# Patient Record
Sex: Male | Born: 1960 | Race: Black or African American | Hispanic: No | Marital: Single | Smoking: Never smoker
Health system: Southern US, Community
[De-identification: ages and names within clinical notes are randomized; demographics above are authoritative.]

## PROBLEM LIST (undated history)

## (undated) DIAGNOSIS — I890 Lymphedema, not elsewhere classified: Secondary | ICD-10-CM

## (undated) DIAGNOSIS — D649 Anemia, unspecified: Secondary | ICD-10-CM

## (undated) DIAGNOSIS — Z972 Presence of dental prosthetic device (complete) (partial): Secondary | ICD-10-CM

## (undated) DIAGNOSIS — K5730 Diverticulosis of large intestine without perforation or abscess without bleeding: Secondary | ICD-10-CM

## (undated) DIAGNOSIS — I10 Essential (primary) hypertension: Secondary | ICD-10-CM

## (undated) DIAGNOSIS — N189 Chronic kidney disease, unspecified: Secondary | ICD-10-CM

## (undated) DIAGNOSIS — G4733 Obstructive sleep apnea (adult) (pediatric): Secondary | ICD-10-CM

## (undated) DIAGNOSIS — L987 Excessive and redundant skin and subcutaneous tissue: Secondary | ICD-10-CM

## (undated) DIAGNOSIS — I509 Heart failure, unspecified: Secondary | ICD-10-CM

## (undated) DIAGNOSIS — I2699 Other pulmonary embolism without acute cor pulmonale: Secondary | ICD-10-CM

## (undated) HISTORY — DX: Heart failure, unspecified: I50.9

## (undated) HISTORY — PX: COLONOSCOPY: SHX174

## (undated) HISTORY — PX: MULTIPLE TOOTH EXTRACTIONS: SHX2053

## (undated) HISTORY — PX: NO PAST SURGERIES: SHX2092

## (undated) HISTORY — DX: Anemia, unspecified: D64.9

## (undated) HISTORY — DX: Obstructive sleep apnea (adult) (pediatric): G47.33

## (undated) HISTORY — PX: EYE SURGERY: SHX253

## (undated) HISTORY — DX: Other pulmonary embolism without acute cor pulmonale: I26.99

## (undated) HISTORY — DX: Diverticulosis of large intestine without perforation or abscess without bleeding: K57.30

## (undated) HISTORY — DX: Essential (primary) hypertension: I10

---

## 2001-11-22 ENCOUNTER — Encounter: Payer: Self-pay | Admitting: *Deleted

## 2001-11-22 ENCOUNTER — Encounter: Payer: Self-pay | Admitting: Cardiovascular Disease

## 2001-11-22 ENCOUNTER — Inpatient Hospital Stay (HOSPITAL_COMMUNITY): Admission: EM | Admit: 2001-11-22 | Discharge: 2001-11-24 | Payer: Self-pay | Admitting: *Deleted

## 2001-12-04 ENCOUNTER — Encounter: Admission: RE | Admit: 2001-12-04 | Discharge: 2001-12-04 | Payer: Self-pay | Admitting: Family Medicine

## 2001-12-07 ENCOUNTER — Encounter: Admission: RE | Admit: 2001-12-07 | Discharge: 2001-12-07 | Payer: Self-pay | Admitting: Family Medicine

## 2002-12-30 ENCOUNTER — Inpatient Hospital Stay (HOSPITAL_COMMUNITY): Admission: EM | Admit: 2002-12-30 | Discharge: 2003-01-01 | Payer: Self-pay | Admitting: Emergency Medicine

## 2003-01-14 ENCOUNTER — Encounter: Admission: RE | Admit: 2003-01-14 | Discharge: 2003-01-14 | Payer: Self-pay | Admitting: Family Medicine

## 2003-08-29 ENCOUNTER — Encounter: Admission: RE | Admit: 2003-08-29 | Discharge: 2003-08-29 | Payer: Self-pay | Admitting: Family Medicine

## 2003-09-11 ENCOUNTER — Encounter: Admission: RE | Admit: 2003-09-11 | Discharge: 2003-09-11 | Payer: Self-pay | Admitting: Family Medicine

## 2003-09-13 ENCOUNTER — Emergency Department (HOSPITAL_COMMUNITY): Admission: EM | Admit: 2003-09-13 | Discharge: 2003-09-14 | Payer: Self-pay | Admitting: Emergency Medicine

## 2003-09-17 ENCOUNTER — Encounter: Admission: RE | Admit: 2003-09-17 | Discharge: 2003-09-17 | Payer: Self-pay | Admitting: Sports Medicine

## 2003-10-10 ENCOUNTER — Encounter: Admission: RE | Admit: 2003-10-10 | Discharge: 2003-10-10 | Payer: Self-pay | Admitting: Family Medicine

## 2003-10-11 ENCOUNTER — Encounter: Admission: RE | Admit: 2003-10-11 | Discharge: 2003-10-11 | Payer: Self-pay | Admitting: Family Medicine

## 2006-04-21 DIAGNOSIS — I10 Essential (primary) hypertension: Secondary | ICD-10-CM

## 2006-04-21 HISTORY — DX: Essential (primary) hypertension: I10

## 2007-01-31 ENCOUNTER — Telehealth (INDEPENDENT_AMBULATORY_CARE_PROVIDER_SITE_OTHER): Payer: Self-pay | Admitting: Family Medicine

## 2007-02-03 ENCOUNTER — Ambulatory Visit: Payer: Self-pay | Admitting: Family Medicine

## 2007-03-10 ENCOUNTER — Ambulatory Visit: Payer: Self-pay | Admitting: Family Medicine

## 2007-03-14 ENCOUNTER — Encounter (INDEPENDENT_AMBULATORY_CARE_PROVIDER_SITE_OTHER): Payer: Self-pay | Admitting: Family Medicine

## 2007-04-06 ENCOUNTER — Encounter (INDEPENDENT_AMBULATORY_CARE_PROVIDER_SITE_OTHER): Payer: Self-pay | Admitting: Family Medicine

## 2007-04-06 ENCOUNTER — Ambulatory Visit: Payer: Self-pay | Admitting: Family Medicine

## 2007-04-06 LAB — CONVERTED CEMR LAB
ALT: 17 units/L (ref 0–53)
AST: 25 units/L (ref 0–37)
Albumin: 3.5 g/dL (ref 3.5–5.2)
Alkaline Phosphatase: 58 units/L (ref 39–117)
BUN: 20 mg/dL (ref 6–23)
CO2: 26 meq/L (ref 19–32)
Calcium: 9.3 mg/dL (ref 8.4–10.5)
Chloride: 104 meq/L (ref 96–112)
Creatinine, Ser: 1.55 mg/dL — ABNORMAL HIGH (ref 0.40–1.50)
Direct LDL: 94 mg/dL
Glucose, Bld: 87 mg/dL (ref 70–99)
Potassium: 4.4 meq/L (ref 3.5–5.3)
Sodium: 138 meq/L (ref 135–145)
Total Bilirubin: 0.4 mg/dL (ref 0.3–1.2)
Total Protein: 8.2 g/dL (ref 6.0–8.3)

## 2007-04-09 ENCOUNTER — Encounter (INDEPENDENT_AMBULATORY_CARE_PROVIDER_SITE_OTHER): Payer: Self-pay | Admitting: *Deleted

## 2007-04-18 ENCOUNTER — Encounter (INDEPENDENT_AMBULATORY_CARE_PROVIDER_SITE_OTHER): Payer: Self-pay | Admitting: Dentistry

## 2007-04-18 ENCOUNTER — Ambulatory Visit (HOSPITAL_COMMUNITY): Admission: RE | Admit: 2007-04-18 | Discharge: 2007-04-18 | Payer: Self-pay | Admitting: Family Medicine

## 2007-05-03 ENCOUNTER — Ambulatory Visit: Payer: Self-pay | Admitting: Family Medicine

## 2007-05-10 ENCOUNTER — Encounter (INDEPENDENT_AMBULATORY_CARE_PROVIDER_SITE_OTHER): Payer: Self-pay | Admitting: Family Medicine

## 2007-10-10 ENCOUNTER — Telehealth: Payer: Self-pay | Admitting: *Deleted

## 2007-10-11 ENCOUNTER — Telehealth (INDEPENDENT_AMBULATORY_CARE_PROVIDER_SITE_OTHER): Payer: Self-pay | Admitting: Family Medicine

## 2007-10-20 ENCOUNTER — Encounter: Payer: Self-pay | Admitting: *Deleted

## 2009-07-09 ENCOUNTER — Ambulatory Visit: Payer: Self-pay | Admitting: Family Medicine

## 2009-07-09 ENCOUNTER — Encounter: Payer: Self-pay | Admitting: Family Medicine

## 2009-07-09 LAB — CONVERTED CEMR LAB
ALT: 11 units/L (ref 0–53)
AST: 16 units/L (ref 0–37)
Albumin: 3.4 g/dL — ABNORMAL LOW (ref 3.5–5.2)
Alkaline Phosphatase: 67 units/L (ref 39–117)
BUN: 18 mg/dL (ref 6–23)
CO2: 24 meq/L (ref 19–32)
Calcium: 8.5 mg/dL (ref 8.4–10.5)
Chloride: 105 meq/L (ref 96–112)
Cholesterol: 171 mg/dL (ref 0–200)
Creatinine, Ser: 1.93 mg/dL — ABNORMAL HIGH (ref 0.40–1.50)
Glucose, Bld: 100 mg/dL — ABNORMAL HIGH (ref 70–99)
HDL: 51 mg/dL (ref >39)
LDL Cholesterol: 96 mg/dL (ref 0–99)
Potassium: 3.7 meq/L (ref 3.5–5.3)
Sodium: 137 meq/L (ref 135–145)
TSH: 5.296 microintl units/mL — ABNORMAL HIGH (ref 0.350–4.500)
Total Bilirubin: 0.6 mg/dL (ref 0.3–1.2)
Total CHOL/HDL Ratio: 3.4
Total Protein: 7.3 g/dL (ref 6.0–8.3)
Triglycerides: 118 mg/dL (ref <150)
VLDL: 24 mg/dL (ref 0–40)

## 2009-07-14 ENCOUNTER — Telehealth: Payer: Self-pay | Admitting: Family Medicine

## 2009-07-14 ENCOUNTER — Ambulatory Visit: Payer: Self-pay | Admitting: Family Medicine

## 2009-07-14 ENCOUNTER — Encounter: Payer: Self-pay | Admitting: Family Medicine

## 2009-07-16 LAB — CONVERTED CEMR LAB
BUN: 32 mg/dL — ABNORMAL HIGH (ref 6–23)
CO2: 23 meq/L (ref 19–32)
Calcium: 8.6 mg/dL (ref 8.4–10.5)
Chloride: 107 meq/L (ref 96–112)
Creatinine, Ser: 2.37 mg/dL — ABNORMAL HIGH (ref 0.40–1.50)
Glucose, Bld: 107 mg/dL — ABNORMAL HIGH (ref 70–99)
Potassium: 4 meq/L (ref 3.5–5.3)
Sodium: 139 meq/L (ref 135–145)

## 2009-07-17 ENCOUNTER — Ambulatory Visit: Payer: Self-pay | Admitting: Family Medicine

## 2009-07-17 DIAGNOSIS — E039 Hypothyroidism, unspecified: Secondary | ICD-10-CM | POA: Insufficient documentation

## 2009-07-17 DIAGNOSIS — I10 Essential (primary) hypertension: Secondary | ICD-10-CM | POA: Insufficient documentation

## 2009-07-24 ENCOUNTER — Ambulatory Visit: Payer: Self-pay | Admitting: Family Medicine

## 2009-07-24 ENCOUNTER — Encounter: Payer: Self-pay | Admitting: Family Medicine

## 2009-07-24 LAB — CONVERTED CEMR LAB
BUN: 21 mg/dL (ref 6–23)
CO2: 22 meq/L (ref 19–32)
Calcium: 9 mg/dL (ref 8.4–10.5)
Chloride: 107 meq/L (ref 96–112)
Creatinine, Ser: 2.06 mg/dL — ABNORMAL HIGH (ref 0.40–1.50)
Glucose, Bld: 99 mg/dL (ref 70–99)
Potassium: 4.5 meq/L (ref 3.5–5.3)
Sodium: 138 meq/L (ref 135–145)
TSH: 4.477 microintl units/mL (ref 0.350–4.500)

## 2009-07-28 ENCOUNTER — Ambulatory Visit: Payer: Self-pay | Admitting: Family Medicine

## 2009-07-28 ENCOUNTER — Telehealth: Payer: Self-pay | Admitting: Family Medicine

## 2009-07-28 DIAGNOSIS — I129 Hypertensive chronic kidney disease with stage 1 through stage 4 chronic kidney disease, or unspecified chronic kidney disease: Secondary | ICD-10-CM

## 2009-07-28 DIAGNOSIS — N184 Chronic kidney disease, stage 4 (severe): Secondary | ICD-10-CM

## 2009-07-28 DIAGNOSIS — I517 Cardiomegaly: Secondary | ICD-10-CM | POA: Insufficient documentation

## 2009-08-12 ENCOUNTER — Telehealth: Payer: Self-pay | Admitting: Family Medicine

## 2009-08-22 ENCOUNTER — Ambulatory Visit: Payer: Self-pay | Admitting: Family Medicine

## 2009-09-02 ENCOUNTER — Telehealth (INDEPENDENT_AMBULATORY_CARE_PROVIDER_SITE_OTHER): Payer: Self-pay | Admitting: *Deleted

## 2009-09-02 ENCOUNTER — Emergency Department (HOSPITAL_COMMUNITY): Admission: EM | Admit: 2009-09-02 | Discharge: 2009-09-02 | Payer: Self-pay | Admitting: Emergency Medicine

## 2009-09-05 ENCOUNTER — Ambulatory Visit: Payer: Self-pay | Admitting: Family Medicine

## 2009-10-28 ENCOUNTER — Encounter: Payer: Self-pay | Admitting: Family Medicine

## 2009-10-30 ENCOUNTER — Telehealth (INDEPENDENT_AMBULATORY_CARE_PROVIDER_SITE_OTHER): Payer: Self-pay | Admitting: *Deleted

## 2009-11-07 ENCOUNTER — Inpatient Hospital Stay (HOSPITAL_COMMUNITY): Admission: EM | Admit: 2009-11-07 | Discharge: 2009-11-10 | Payer: Self-pay | Admitting: Emergency Medicine

## 2009-11-07 ENCOUNTER — Ambulatory Visit: Payer: Self-pay | Admitting: Family Medicine

## 2009-11-07 ENCOUNTER — Encounter: Payer: Self-pay | Admitting: Family Medicine

## 2009-11-07 ENCOUNTER — Telehealth: Payer: Self-pay | Admitting: Family Medicine

## 2009-11-08 ENCOUNTER — Ambulatory Visit: Payer: Self-pay | Admitting: Surgery

## 2009-11-08 ENCOUNTER — Encounter: Payer: Self-pay | Admitting: Family Medicine

## 2009-11-11 LAB — CONVERTED CEMR LAB
HCT: 38.4 % — ABNORMAL LOW (ref 39.0–52.0)
Hemoglobin: 13.3 g/dL (ref 13.0–17.0)
MCHC: 34.6 g/dL (ref 30.0–36.0)
MCV: 81.5 fL (ref 78.0–100.0)
Platelets: 326 10*3/uL (ref 150–400)
Pro B Natriuretic peptide (BNP): 167 pg/mL — ABNORMAL HIGH (ref 0.0–100.0)
RBC: 4.71 M/uL (ref 4.22–5.81)
RDW: 16 % — ABNORMAL HIGH (ref 11.5–15.5)
WBC: 6.2 10*3/uL (ref 4.0–10.5)

## 2009-11-12 ENCOUNTER — Ambulatory Visit: Payer: Self-pay | Admitting: Family Medicine

## 2009-11-12 DIAGNOSIS — I2699 Other pulmonary embolism without acute cor pulmonale: Secondary | ICD-10-CM

## 2009-11-12 HISTORY — DX: Other pulmonary embolism without acute cor pulmonale: I26.99

## 2009-11-12 LAB — CONVERTED CEMR LAB: INR: 2.2

## 2009-11-14 ENCOUNTER — Ambulatory Visit: Payer: Self-pay | Admitting: Family Medicine

## 2009-11-14 LAB — CONVERTED CEMR LAB: INR: 2.9

## 2009-11-21 ENCOUNTER — Encounter: Payer: Self-pay | Admitting: Family Medicine

## 2009-11-21 ENCOUNTER — Ambulatory Visit: Payer: Self-pay | Admitting: Family Medicine

## 2009-11-21 LAB — CONVERTED CEMR LAB
INR: 5.6
INR: 4.27 — ABNORMAL HIGH (ref <1.50)
Prothrombin Time: 40.9 s — ABNORMAL HIGH (ref 11.6–15.2)

## 2009-11-24 ENCOUNTER — Ambulatory Visit: Payer: Self-pay | Admitting: Family Medicine

## 2009-11-24 LAB — CONVERTED CEMR LAB: INR: 3.8

## 2009-11-26 ENCOUNTER — Ambulatory Visit: Payer: Self-pay | Admitting: Family Medicine

## 2009-11-26 LAB — CONVERTED CEMR LAB: INR: 3.1

## 2009-12-02 ENCOUNTER — Encounter: Payer: Self-pay | Admitting: Family Medicine

## 2009-12-02 ENCOUNTER — Ambulatory Visit: Payer: Self-pay | Admitting: Family Medicine

## 2009-12-02 LAB — CONVERTED CEMR LAB: INR: 2.1

## 2009-12-12 ENCOUNTER — Ambulatory Visit: Payer: Self-pay | Admitting: Family Medicine

## 2009-12-12 LAB — CONVERTED CEMR LAB: INR: 1.5

## 2009-12-26 ENCOUNTER — Ambulatory Visit: Payer: Self-pay | Admitting: Family Medicine

## 2009-12-26 LAB — CONVERTED CEMR LAB: INR: 2

## 2010-01-08 ENCOUNTER — Ambulatory Visit: Payer: Self-pay | Admitting: Family Medicine

## 2010-01-08 LAB — CONVERTED CEMR LAB: INR: 2.2

## 2010-01-19 ENCOUNTER — Encounter: Payer: Self-pay | Admitting: Family Medicine

## 2010-01-21 ENCOUNTER — Encounter (INDEPENDENT_AMBULATORY_CARE_PROVIDER_SITE_OTHER): Payer: Self-pay | Admitting: *Deleted

## 2010-01-29 ENCOUNTER — Ambulatory Visit: Payer: Self-pay | Admitting: Family Medicine

## 2010-01-29 LAB — CONVERTED CEMR LAB: INR: 2.5

## 2010-02-26 ENCOUNTER — Ambulatory Visit: Admission: RE | Admit: 2010-02-26 | Discharge: 2010-02-26 | Payer: Self-pay | Source: Home / Self Care

## 2010-02-26 LAB — CONVERTED CEMR LAB: INR: 2.2

## 2010-03-24 NOTE — Progress Notes (Signed)
  Phone Note Other Incoming   Request: Send information Summary of Call: DISABILITY RECORDS REQUEST FORWARDED TO [place].

## 2010-03-24 NOTE — Miscellaneous (Signed)
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 HTN Drug Abuse - history of  ETOH abuse.  CKD stage 3 (01/19/2010 12:03)       Past History:  Past Medical History: CHF - systolic Echo Q000111Q EF 123456; 04/18/07 echo 55-60% EF - LV wall thickness moderately increased, high LV filling pressure, LA mildly dilated, left atrium mildly dilated. HTN Drug Abuse - history of  ETOH abuse.  CKD stage 3

## 2010-03-24 NOTE — Assessment & Plan Note (Signed)
Summary: BP [facility]C K/[facility]  Nurse Visit   Vital Signs:  Patient profile:   50 year old male BP sitting:   171 / 110  (right arm) Cuff size:   large  Vitals Entered By: Audelia Hives CMA (Jul 14, 2009 3:03 PM) CC: bp check  Blood Pressure Check  Did patient rest for 60 secs or longer:yes   Which arm was used:   right   What was the cuff size: large Manual measurement:yes Has patient taken their BP meds today:yes   If no, when was last dose:   Allergies: No Known Drug Allergies  Orders Added: 1)  Est Level 1- [facility] [id number]

## 2010-03-24 NOTE — Progress Notes (Signed)
Summary: phn msg  Phone Note Call from Patient Call back at [phone]   Caller: Patient Summary of Call: needs to speak w/ doctor about meds  Initial call taken by: Audie Clear,  August 12, 2009 10:44 AM  Follow-up for Phone Call        sent to pcp Follow-up by: Audelia Hives CMA,  August 12, 2009 11:39 AM  Additional Follow-up for Phone Call Additional follow up Details #1::        called patient and left message.  Additional Follow-up by: Esmeralda Arthur MD,  August 13, 2009 8:43 AM

## 2010-03-24 NOTE — Miscellaneous (Signed)
Summary: medical record request  Clinical Lists Changes  Rec'd medical record request by pt Picked up 10/23/09 Tobin Chad  January 21, 2010 10:17 AM

## 2010-03-24 NOTE — Assessment & Plan Note (Signed)
Summary: HTN & labs/eo   Vital Signs:  Patient profile:   50 year old male Weight:      394 pounds Temp:     98.7 degrees F Pulse rate:   74 / minute BP sitting:   178 / 118  Vitals Entered By: Christen Bame CMA (July 28, 2009 11:54 AM)  Serial Vital Signs/Assessments:  Time      Position  BP       Pulse  Resp  Temp     By           R Arm     190/120                        Sherren Mocha Nelly Scriven MD           L Arm     190/120                        Sherren Mocha Seger Jani MD  CC: f/u BP Is Patient Diabetic? No Pain Assessment Patient in pain? no        Primary Care Provider:  Esmeralda Arthur MD  CC:  f/u BP.  History of Present Illness: HYPERTENSION Disease Monitoring   Blood pressure range:not checking at home   Chest pain:no   Dyspnea:no   Claudication:no  Medications   Compliance:Yes. Taking Amlodipine 10 mg each morning and Carvedilol 25 mg twice a day.  Side effects   Lightheadedness:no   Urinary frequency:no   Edema: some improvement in bilateral leg edema     Prevention   Exercise:Trying to walk three times a week but bilateral knee pain limits him   Diet pattern:   Salt restriction:eating snack chips.     Habits & Providers  Alcohol-Tobacco-Diet     Alcohol drinks/day: <1     Tobacco Status: never  Current Medications (verified): 1)  Coreg 25 Mg  Tabs (Carvedilol) .... Take One Tablet By Mouth Two Times A Day 2)  Aspirin 81 Mg Chew (Aspirin) .... Take One Pill A Day 3)  Amlodipine Besylate 10 Mg Tabs (Amlodipine Besylate) .... Take 1 Pill Daily For Blood Pressure 4)  Chlorthalidone 25 Mg Tabs (Chlorthalidone) .Marland Kitchen.. 1 Tablet By Mouth Each Morning.  For High Blood Pressure.  Allergies: No Known Drug Allergies PMH reviewed for relevance  Social History: quit cocaine, etoh, marijuana with last hospitalization and new girlfriend. Has daughter who is in highschool. He is having trouble finding a job and his car motor is having problems.  15 -16 years of  education. Played college football.  Currently seeking emplyment and looking into disability.   Physical Exam  General:  Obese, NAD, groomed.  Coooperative with interview and exam. Extremities:  2+ left pretibial edema and 2+ right pretibial dema.     Impression & Recommendations:  Problem # 1:  HYPERTENSION, UNCONTROLLED (ICD-401.9) Assessment Unchanged  No clinical evidence of new end organ injury.  Patient's Echocardiogram in 2009 showed EF% 55-60% and Left Ventricular Hypertrophy. Patient is seeing Clarice Pole today to apply for [facility] membership. Advised low salt diet.  Counseled against use of NSAIDS.  He may use APAP for pain control. He is alcohol intake is not at level that should cause sec ondary hypertension.  He denies use of illicit drugs for years.  Patient recent rise in serum Creatinine was less than 30% rise, so could consider restarting ACE-I at low dose in future. His Best  BP control in 04/2007 was on combination of Clonidine 0.2 mg two times a day, Enalapril 20 mg daily, Lasix, 20 mg daily and Coreg 25 mg daily.  Once his [facility] membership is approved, consider checking BNP, +/- Echocardiogram,  Urine Protein:Creatinine ratio to look for proteinuria, further work-up of his CKD-stage III.  Screen for Obstructive Sleep Apnea on Next OV using Epworth Sleepiness scale as OSA can contribute to secodnary hypertension    If unable to attain adequate BP control with 4 agents (without without loop diuretic) or unable to tolerate antihypertensive, then pursue secondary hypertension work-up.  Will add Chlorthalidone 25 mg daily (his last GFR of 47 ml should allow response to a thiazide). Continue Amlodipine and Carvedilol.  Follow up with Dr Annamary Carolin next week to assess response to addition of thiazide to antihypertensive regiment.  Consider addition of low-dose ACE-inhibitor on next office visit as long as creatinine and potassium can be monitored.    Clonidine .2 mg two times a day is another low cost antihypertensive choice.   Would not start Spironolactone unless patient Cr and K+ frequent monitoring is possible (including financial coverage)   Orders: [place]- Est Level  3 [id number])  Problem # 2:  CHRONIC KIDNEY DISEASE STAGE III (MODERATE) (ICD-585.3) Assessment: New Consider work-up when [facility] is in place.   Complete Medication List: 1)  Coreg 25 Mg Tabs (Carvedilol) .... Take one tablet by mouth two times a day 2)  Aspirin 81 Mg Chew (Aspirin) .... Take one pill a day 3)  Amlodipine Besylate 10 Mg Tabs (Amlodipine besylate) .... Take 1 pill daily for blood pressure 4)  Chlorthalidone 25 Mg Tabs (Chlorthalidone) .Marland Kitchen.. 1 tablet by mouth each morning.  for high blood pressure.  Patient Instructions: 1)  Please schedule a follow-up appointment in 1 week with Dr Annamary Carolin.  May double book.  2)  Start taking Chlorthalidone one a day to decrease your blood pressure. 3)  Continue taking Amlodipine one tablet daily  4)  Continue taking Carvedilolo (Coreg) one tablet twice a day. 5)  Continue working with Clarice Pole so you can get qulified to have reduced coasts of health care and tests. 6)  Salt is the enemy.  Prescriptions: CHLORTHALIDONE 25 MG TABS (CHLORTHALIDONE) 1 tablet by mouth each morning.  For High Blood Pressure.  #30 x 5   Entered and Authorized by:   Sherren Mocha Rosevelt Luu MD   Signed by:   Sherren Mocha Dazia Lippold MD on 07/28/2009   Method used:   Electronically to        [facility] [phone]* (retail)       [address]       [place], [place]  [address]       Ph: [phone]       Fax: [phone]   RxID:   [id number]

## 2010-03-24 NOTE — Assessment & Plan Note (Signed)
Summary: HTN, obesity   Vital Signs:  Patient profile:   50 year old male Weight:      394.1 pounds Temp:     98.4 degrees F oral Pulse rate:   102 / minute Pulse rhythm:   regular BP sitting:   222 / 158  (left arm) Cuff size:   large  Vitals Entered By: Audelia Hives CMA (Jul 09, 2009 9:15 AM) CC: HTN, weight/obesity Is Patient Diabetic? No Comments pt is  concerned with htn and stated that he does eat alot of fast food tries to walk   Primary Care Provider:  Esmeralda Arthur MD  CC:  HTN and weight/obesity.  History of Present Illness: PT has not been to see the MD in 2 years.   hypertension: Pt has significant elevation in his BP today. It was 222/158. He is not having any symptoms such as blurry vision or headache. He is not having any changes in the way his heart feels or palpitations. He has not bee exercising. He has been out of BP meds for > 1 year.   Obesity: Pt is morbidly obese. He has gained almost 30 lbs since he was last seen. Discussed food. He eats a lot of fast food. He eats bacon egg and cheese in the morning. He eats pizze with pepparoni or lasagna for lunch. He eats late at night while watching TV. Discussed healthier options. Discussed doing exercise. He says he is walking 30 min 3 times a week. He is drinking a 12 pack on the weekends but no EtOH during the week.   Habits & Providers  Alcohol-Tobacco-Diet     Tobacco Status: no  Exercise-Depression-Behavior     Does Patient Exercise: yes     Exercise Counseling: to improve exercise regimen     Type of exercise: walking     Exercise (avg: min/session): <30     Times/week: 3  Current Medications (verified): 1)  Enalapril Maleate 20 Mg  Tabs (Enalapril Maleate) .... Take One Tablet By Mouth Daily. Make An Appointment With Your Doctor To Follow Up Your Blood Pressure. 2)  Furosemide 40 Mg Tabs (Furosemide) .... Take One Pill Daily 3)  Coreg 25 Mg  Tabs (Carvedilol) .... Take One Tablet By Mouth Two  Times A Day 4)  Aspirin 81 Mg Chew (Aspirin) .... Take One Pill A Day  Allergies (verified): No Known Drug Allergies  Social History: Smoking Status:  no Does Patient Exercise:  yes  Review of Systems        vitals reviewed and pertinent negatives and positives seen in HPI   Physical Exam  General:  morbidly obese, alert, cooperative to examination, and good hygiene.   Head:  Normocephalic and atraumatic without obvious abnormalities. No apparent alopecia or balding. Lungs:  Normal respiratory effort, chest expands symmetrically. Lungs are clear to auscultation, no crackles or wheezes. Heart:  Normal rate and regular rhythm. S1 and S2 normal without gallop, murmur, click, rub or other extra sounds. Extremities:  left leg swollen with blisters. Pt has significant lower extremity edema that appears chronic with chronic venous stasis changes.  Psych:  mildly depressed but determined   Impression & Recommendations:  Problem # 1:  HYPERTENSION, BENIGN SYSTEMIC (ICD-401.1) Assessment Deteriorated Pt has extremely elevated BP's today. He has been out of his meds for over a year and now wants to restart his meds. Plan to restart them today. Get blood work done. Plan to start him on Lasix to decrease the fluid in  his legs and decreased BP. Will see what his Cr is on BMET and if elevated switch to a beta blocker. Pt to return to clinic for a RN visit to get his BP's check on Monday.   His updated medication list for this problem includes:    Enalapril Maleate 20 Mg Tabs (Enalapril maleate) .Marland Kitchen... Take one tablet by mouth daily. make an appointment with your doctor to follow up your blood pressure.    Furosemide 40 Mg Tabs (Furosemide) .Marland Kitchen... Take one pill daily    Coreg 25 Mg Tabs (Carvedilol) .Marland Kitchen... Take one tablet by mouth two times a day  Orders: Comp Met-[facility] [id number]) TSH-[facility] [id number]) Basic Met-[facility] [id number]) [place]- Est  Level 4 [id number])  Problem # 2:  OBESITY, UNSPECIFIED  (ICD-278.00) Assessment: Deteriorated Pt has a lot of excess weight. SPent a lot of time discussing healty food options. Once we get his BP under control will plan to get him to see Dr. Jenne Campus  Orders: Lipid-[facility] [phone]) [facility]- Est  Level 4 [id number])  Complete Medication List: 1)  Enalapril Maleate 20 Mg Tabs (Enalapril maleate) .... Take one tablet by mouth daily. make an appointment with your doctor to follow up your blood pressure. 2)  Furosemide 40 Mg Tabs (Furosemide) .... Take one pill daily 3)  Coreg 25 Mg Tabs (Carvedilol) .... Take one tablet by mouth two times a day 4)  Aspirin 81 Mg Chew (Aspirin) .... Take one pill a day  Patient Instructions: 1)  Eat lots of veggies and fruit. Fresh is better.  2)  You have a list of high fiber foods. Eat those to feel full longer.  3)  Continue walking. Don't eat too late.  4)  You are getting labs today. I will call you with abnormal results. 5)  Pick up your prescriptions today and start taking them.  6)  Make an appointment to see the nurse on Monday to get your blood pressure checked and to see if your swelling has gone down.  Prescriptions: FUROSEMIDE 40 MG TABS (FUROSEMIDE) take one pill daily  #90 x 3   Entered and Authorized by:   Esmeralda Arthur MD   Signed by:   Esmeralda Arthur MD on 07/09/2009   Method used:   Electronically to        [facility] [phone]* (retail)       [address]       [place], [place]  [address]       Ph: [phone]       Fax: [phone]   RxID:   [id number] ASPIRIN 81 MG CHEW (ASPIRIN) take one pill a day  #90 x 3   Entered and Authorized by:   Esmeralda Arthur MD   Signed by:   Esmeralda Arthur MD on 07/09/2009   Method used:   Electronically to        [facility] [phone]* (retail)       [place], [place]  [address]       Ph: [phone]       Fax: [phone]   RxID:   [id number] COREG 25 MG  TABS (CARVEDILOL) Take one tablet by mouth two times a day   #180 x 3   Entered and Authorized by:   Esmeralda Arthur MD   Signed by:   Esmeralda Arthur MD on 07/09/2009   Method used:   Electronically to        [facility] [phone]* (retail)       [place], [place]  [address]       Ph: [phone]       Fax: [phone]   RxID:   [id number] LASIX 20 MG  TABS (FUROSEMIDE) Take one tablet by mouth daily  #90 x 3   Entered and Authorized by:   Esmeralda Arthur MD   Signed by:   Esmeralda Arthur MD on 07/09/2009   Method used:   Electronically to        [facility] [phone]* (retail)       [place], [place]  [address]       Ph: [phone]       Fax: [phone]   RxID:   [id number] ENALAPRIL MALEATE 20 MG  TABS (ENALAPRIL MALEATE) Take one tablet by mouth daily. Make an appointment with your doctor to follow up your blood pressure.  #90 x 3   Entered and Authorized by:   Esmeralda Arthur MD   Signed by:   Esmeralda Arthur MD on 07/09/2009   Method used:   Electronically to        [facility] [phone]* (retail)       [address]       [place], [place]  [address]       Ph: [phone]       Fax: [phone]   RxID:   [id number]

## 2010-03-24 NOTE — Progress Notes (Signed)
Summary: Pt called to come to ED  Phone Note Outgoing Call Call back at [phone]   Call placed by: Vickie Epley MD,  November 07, 2009 3:33 PM Call placed to: Patient Summary of Call: Called pt to notify of labs.  Unable to reach him.  Left message on voicemail for him to come to ED for further workup.

## 2010-03-24 NOTE — Assessment & Plan Note (Signed)
Summary: bp check/eo  Nurse Visit  patient in office for BP check . BP checked using large adult cuff anfter resting. BP checked manually. LA 190/122,  RA 186/120 pulse 68. patient did not bring medications with him to visit. states he did stop the 2 meds that Dr. Annamary Carolin told him to stop.  However he states he is currently taking 4 different meds. our med list shows three meds that he should be taking. he does not know names of meds. called pharmacy and was told on 07/09/2009 he got refill on Lasix 20 mg and Lasix 40 mg. he knows he stopped one on the Lasxi but is not sure about the other. patient denies any pain today and does not appear in any distress. consulted with Dr. Erin Hearing and patient will have labs drawn today . then he will go home and call back with the names of the meds he is taking.  Dr. Erin Hearing will then decide on additional meds. call back # [phone].  patient called back and we revieved all his meds. he has been taking Lasix 20 mg in addition to metroprol and Coreg. this was from an old prescription he had. he did stop the bottle of Lasix 40 mg. . Dr. Annamary Carolin was advised and she will review chart and meds this evening and we will call patient back tomorrow with new instructions .  RN advised patient to stop lasix and continue metroprol and coreg for now.  Marcell Barlow RN  July 24, 2009 2:22 PM    Allergies: No Known Drug Allergies  Orders Added: 1)  No Charge Patient Arrived (NCPA0) [NCPA0]

## 2010-03-24 NOTE — Progress Notes (Signed)
       Additional Follow-up for Phone Call Additional follow up Details #2:: Follow-up by: Elige Radon RN,  Jul 15, 2009 10:13 AM  Called patient to check up on him and give him results of his lab work. If he calls back let him know that his cholesterol is normal, but his Thyroid level is low, his kidneys are showing some signs of problems, and his blood pressure is still too high. See if he can come see me on Thursday.   he will be here thursday at 8:30 to discuss labs with pcp...Marland KitchenMarland KitchenElige Radon RN  Jul 15, 2009 10:13  Thank you.   Esmeralda Arthur MD  Jul 15, 2009 9:58 PM

## 2010-03-24 NOTE — Assessment & Plan Note (Signed)
Summary: breathing prob,df   Vital Signs:  Patient profile:   50 year old male Height:      66 inches Weight:      372.5 pounds BMI:     60.34 O2 Sat:      93 % on Room air Temp:     97.6 degrees F oral Pulse rate:   76 / minute BP sitting:   172 / 84  (left arm) Cuff size:   large  Vitals Entered By: Levert Feinstein LPN (September 16, 624THL 11:42 AM)  O2 Flow:  Room air CC: sob x 1.5 wks Is Patient Diabetic? No Pain Assessment Patient in pain? no        Primary Care Provider:  Esmeralda Arthur MD  CC:  sob x 1.5 wks.  History of Present Illness: SOB: Pt comes in today with a left swollen leg and shortness of breath. He has been SOB for the last 10 days. It is worse when he exercises or climbs stairs. He has not had any fever, shills, URI symptoms, or chest pain. He has chronic hypertension that has been hard to manage but was better controlled before today. He has a lot of edema in his left lower leg that started around the same time he got SOB. He says the swelling goes down at night. He is concerned it's PNA or bronchitis.   Habits & Providers  Alcohol-Tobacco-Diet     Alcohol drinks/day: <1     Tobacco Status: never  Current Medications (verified): 1)  Coreg 25 Mg  Tabs (Carvedilol) .... Take One Tablet By Mouth Two Times A Day 2)  Aspirin 81 Mg Chew (Aspirin) .... Take One Pill A Day 3)  Amlodipine Besylate 10 Mg Tabs (Amlodipine Besylate) .... Take 1 Pill Daily For Blood Pressure 4)  Indapamide 2.5 Mg Tabs (Indapamide) .... One Tablet By Mouth Daily. 5)  Catapres 0.1 Mg Tabs (Clonidine Hcl) .... Take 1 Pill A Day For 1 Week, Then Come Get Bp Checked, If > 140 Take 2 Pills A Day Until Seen.  Allergies (verified): No Known Drug Allergies  Review of Systems        vitals reviewed and pertinent negatives and positives seen in HPI   Physical Exam  General:  Well-developed,well-nourished,in no acute distress; alert,appropriate and cooperative throughout  examination Lungs:  Rt lung has wheezes/rhonchi, left lung is clear.  Heart:  Normal rate and regular rhythm. S1 and S2 normal without gallop, murmur, click, rub or other extra sounds. Extremities:  left lower ext has 4+ pitting edema, Rt lower leg has 1+ pitting edema.    Impression & Recommendations:  Problem # 1:  SHORTNESS OF BREATH (ICD-786.05) Assessment New Pt had nebs done in the office. O2 sat improved from 91-93. I do not think this is asthma.  I am having stat labs done and CXR done. results to be paged to [phone]. I have spoken with the MD who will be accepting this page.  Concern for CHF: ordered BNP stat, would start Lasix if elevated Concern for PE and DVT: ordered d-dimer and if elevated would have pt go to ED for CT-Angio and dopplers Concern for PNA/Bronchitis: Ordering CBC stat, CXR now, would start Abx if something found.  Pt can follow up with me on Monday.   Orders: CBC-[facility] [id number]) D-Dimer- [facility] [id number]) B Nat Peptide-[facility] [phone]) Atrovent 1mg  (Neb) [phone]) CXR- 2view (CXR) [facility]- Est Level  3 [id number])  Complete Medication List: 1)  Coreg 25 Mg Tabs (Carvedilol) .Marland KitchenMarland KitchenMarland Kitchen  Take one tablet by mouth two times a day 2)  Aspirin 81 Mg Chew (Aspirin) .... Take one pill a day 3)  Amlodipine Besylate 10 Mg Tabs (Amlodipine besylate) .... Take 1 pill daily for blood pressure 4)  Indapamide 2.5 Mg Tabs (Indapamide) .... One tablet by mouth daily. 5)  Catapres 0.1 Mg Tabs (Clonidine hcl) .... Take 1 pill a day for 1 week, then come get bp checked, if > 140 take 2 pills a day until seen.  Patient Instructions: 1)  got to get CXR now    Medication Administration  Medication # 1:    Medication: Albuterol Sulfate Sol 1mg  unit dose    Diagnosis: SHORTNESS OF BREATH (ICD-786.05)    Dose: 2.5mg /29ml    Route: inhaled    Exp Date: 04/04/2011    Lot #: [id number]    Mfr: nephron    Patient tolerated medication without complications    Given by: Christen Bame CMA  (November 07, 2009 12:15 PM)  Medication # 2:    Medication: Atrovent 1mg  (Neb)    Diagnosis: SHORTNESS OF BREATH (ICD-786.05)    Dose: 0.5mg     Route: inhaled    Exp Date: 10/24/2010    Lot #: [id number]    Mfr: nephron    Patient tolerated medication without complications    Given by: Christen Bame CMA (November 07, 2009 12:15 PM)  Orders Added: 1)  CBC-[facility] [85027] 2)  D-Dimer- [facility] [id number] 3)  B Nat Peptide-[facility] [id number] 4)  Atrovent 1mg  (Neb) [id number] 5)  CXR- 2view [CXR] 6)  [place]- Est Level  3 [id number]   Appended Document: breathing prob,df

## 2010-03-24 NOTE — Miscellaneous (Signed)
Summary: Amlodipine refill  Clinical Lists Changes  Medications: Rx of AMLODIPINE BESYLATE 10 MG TABS (AMLODIPINE BESYLATE) take 1 pill daily for blood pressure;  #90 x 3;  Signed;  Entered by: Esmeralda Arthur MD;  Authorized by: Esmeralda Arthur MD;  Method used: Electronically to [facility] [phone]*, [address], [place], [place]  [address], Ph: [phone], Fax: [phone]    Prescriptions: AMLODIPINE BESYLATE 10 MG TABS (AMLODIPINE BESYLATE) take 1 pill daily for blood pressure  #90 x 3   Entered and Authorized by:   Esmeralda Arthur MD   Signed by:   Esmeralda Arthur MD on 12/02/2009   Method used:   Electronically to        [facility] [phone]* (retail)       [address]       [place], [place]  [address]       Ph: [phone]       Fax: [phone]   RxID:   [id number]

## 2010-03-24 NOTE — Assessment & Plan Note (Signed)
Summary: PE, HTN, coumadin therapy   Vital Signs:  Patient profile:   50 year old male Weight:      367 pounds Temp:     98 degrees F oral Pulse rate:   70 / minute Pulse rhythm:   regular BP sitting:   135 / 88  (left arm) Cuff size:   large  Vitals Entered By: Audelia Hives CMA(November 14, 2009 9:57 AM) CC: PE   Primary Care Provider:  Esmeralda Arthur MD  CC:  PE.  History of Present Illness: Pulmonary Embolus; Pt was dx with a PE (no DVT found) on 11-07-09 and was in the hospital until 11-10-09. He was started on Lovenox shots and was started on coumadin while in the hospital. He is to have his INR checked here today.   hypertension: Pt's hypertension was better controlled in the hospital. THey increased his clonidine to 0.1 mg two times a day.   Habits & Providers  Alcohol-Tobacco-Diet     Alcohol drinks/day: <1     Tobacco Status: never  Current Medications (verified): 1)  Coreg 25 Mg  Tabs (Carvedilol) .... Take One Tablet By Mouth Two Times A Day 2)  Aspirin 81 Mg Chew (Aspirin) .... Take One Pill A Day 3)  Amlodipine Besylate 10 Mg Tabs (Amlodipine Besylate) .... Take 1 Pill Daily For Blood Pressure 4)  Indapamide 2.5 Mg Tabs (Indapamide) .... One Tablet By Mouth Daily. 5)  Catapres 0.1 Mg Tabs (Clonidine Hcl) .Marland Kitchen.. 1 Pill By Mouth Bid 6)  Warfarin Sodium 10 Mg Tabs (Warfarin Sodium) .... Taek 1 Pill Daily At The Same Time 7)  Warfarin Sodium 2.5 Mg Tabs (Warfarin Sodium) .... Take 1 Pill Daily At The Same Time  Allergies (verified): No Known Drug Allergies  Review of Systems        vitals reviewed and pertinent negatives and positives seen in HPI   Physical Exam  General:  Well-developed,well-nourished,in no acute distress; alert,appropriate and cooperative throughout examination Lungs:  Normal respiratory effort, chest expands symmetrically. Lungs are clear to auscultation, no crackles or wheezes. Heart:  Normal rate and regular rhythm. S1 and S2 normal  without gallop, murmur, click, rub or other extra sounds.   Impression & Recommendations:  Problem # 1:  PULMONARY EMBOLISM (ICD-415.19) Assessment Unchanged Pt found to have a PE and admitted to the hospital on 11-07-09. I saw him while in the hospital. He is doing much better now. No SOB now. Will cont Warfarin   His updated medication list for this problem includes:    Aspirin 81 Mg Chew (Aspirin) .Marland Kitchen... Take one pill a day    Warfarin Sodium 10 Mg Tabs (Warfarin sodium) .Marland Kitchen... Taek 1 pill daily at the same time    Warfarin Sodium 2.5 Mg Tabs (Warfarin sodium) .Marland Kitchen... Take 1 pill daily at the same time  Orders: [facility]- Est Level  3 [id number])  Problem # 2:  HYPERTENSION, UNCONTROLLED (ICD-401.9) Assessment: Improved Pt's BP is very good today. He is on the increased clonidine dose and doing well. Will continue all his current meds.   His updated medication list for this problem includes:    Coreg 25 Mg Tabs (Carvedilol) .Marland Kitchen... Take one tablet by mouth two times a day    Amlodipine Besylate 10 Mg Tabs (Amlodipine besylate) .Marland Kitchen... Take 1 pill daily for blood pressure    Indapamide 2.5 Mg Tabs (Indapamide) ..... One tablet by mouth daily.    Catapres 0.1 Mg Tabs (Clonidine hcl) .Marland Kitchen... 1 pill by mouth  bid  Orders: [place]- Est Level  3 [id number])  Problem # 3:  COUMADIN THERAPY (ICD-V58.61) Assessment: Unchanged checking INR today. WIl see pt based on INR adjustments needing to be made.  Orders: [facility]R/PT-[facility] [id number]) [place]- Est Level  3 [id number])  Complete Medication List: 1)  Coreg 25 Mg Tabs (Carvedilol) .... Take one tablet by mouth two times a day 2)  Aspirin 81 Mg Chew (Aspirin) .... Take one pill a day 3)  Amlodipine Besylate 10 Mg Tabs (Amlodipine besylate) .... Take 1 pill daily for blood pressure 4)  Indapamide 2.5 Mg Tabs (Indapamide) .... One tablet by mouth daily. 5)  Catapres 0.1 Mg Tabs (Clonidine hcl) .Marland Kitchen.. 1 pill by mouth bid 6)  Warfarin Sodium 10 Mg Tabs (Warfarin sodium) .... Taek 1  pill daily at the same time 7)  Warfarin Sodium 2.5 Mg Tabs (Warfarin sodium) .... Take 1 pill daily at the same time  Patient Instructions: 1)  You are doing better.  Prescriptions: WARFARIN SODIUM 2.5 MG TABS (WARFARIN SODIUM) take 1 pill daily at the same time  #90 x 3   Entered and Authorized by:   Esmeralda Arthur MD   Signed by:   Esmeralda Arthur MD on 11/14/2009   Method used:   Electronically to        [facility] [phone]* (retail)       [place], [place]  [address]       Ph: [phone]       Fax: [phone]   RxID:   [id number] WARFARIN SODIUM 10 MG TABS (WARFARIN SODIUM) taek 1 pill daily at the same time  #90 x 3   Entered and Authorized by:   Esmeralda Arthur MD   Signed by:   Esmeralda Arthur MD on 11/14/2009   Method used:   Electronically to        [facility] [phone]* (retail)       [place], [place]  [address]       Ph: [phone]       Fax: [phone]   RxID:   [id number] CATAPRES 0.1 MG TABS (CLONIDINE HCL) 1 pill by mouth BID  #90 x 3   Entered and Authorized by:   Esmeralda Arthur MD   Signed by:   Esmeralda Arthur MD on 11/14/2009   Method used:   Electronically to        [facility] [phone]* (retail)       [address]       [place], [place]  [address]       Ph: [phone]       Fax: [phone]   RxID:   [id number]    ANTICOAGULATION RECORD PREVIOUS REGIMEN & LAB RESULTS Anticoagulation Diagnosis:  Pulmonary Embolism on  11/12/2009 Previous INR Goal Range:  2-3 on  11/12/2009 Previous INR:  2.2 on  11/12/2009 Previous Coumadin Dose(mg):  7.5mg  tablet & 5mg  tablet on  11/12/2009 Previous Regimen:  Continue Lovenox 165mg  twice a day & Coumadin 12.5mg  daily on  11/12/2009  NEW REGIMEN & LAB RESULTS Current INR: 2.9 Regimen: Coumadin 12.5mg  daily/Stop Lovenox  Provider: Dr. Annamary Carolin Repeat testing in: 11/19/09 Other Comments: ...........test performed by...........Marland KitchenHedy Camara, CMA   Dose has been reviewed with patient or caretaker during this visit. Reviewed by: Dr. Annamary Carolin  Anticoagulation Visit Questionnaire Coumadin dose missed/changed:  No Abnormal Bleeding Symptoms:  No  Any diet  changes including alcohol intake, vegetables or greens since the last visit:  No Any illnesses or hospitalizations since the last visit:  No Any signs of clotting since the last visit (including chest discomfort, dizziness, shortness of breath, arm tingling, slurred speech, swelling or redness in leg):  No  MEDICATIONS COREG 25 MG  TABS (CARVEDILOL) Take one tablet by mouth two times a day ASPIRIN 81 MG CHEW (ASPIRIN) take one pill a day AMLODIPINE BESYLATE 10 MG TABS (AMLODIPINE BESYLATE) take 1 pill daily for blood pressure INDAPAMIDE 2.5 MG TABS (INDAPAMIDE) One tablet by mouth daily. CATAPRES 0.1 MG TABS (CLONIDINE HCL) 1 pill by mouth BID WARFARIN SODIUM 10 MG TABS (WARFARIN SODIUM) taek 1 pill daily at the same time WARFARIN SODIUM 2.5 MG TABS (WARFARIN SODIUM) take 1 pill daily at the same time

## 2010-03-24 NOTE — Progress Notes (Signed)
Summary: Change Chlorthalidone to Indapmide to get $4 Rx at [facility]  Phone Note Call from Patient Call back at [phone]   Caller: Patient Summary of Call: Was perscribed a fluid pill today, but the one he was perscribed on the 4.00 plan.  They pharmacy told him 3 they could substitute if for.  Wondering if this would be o.k.   Hydrochlorothiazide is the one they said they could use. Initial call taken by: Raymond Gurney,  July 28, 2009 2:11 PM  Follow-up for Phone Call        Will change to Indapamide 2.5 mg daily. Stop Chlorthalidone. Follow-up by: Anzal Bartnick MD,  July 28, 2009 4:30 PM    New/Updated Medications: INDAPAMIDE 2.5 MG TABS (INDAPAMIDE) One tablet by mouth daily. Prescriptions: INDAPAMIDE 2.5 MG TABS (INDAPAMIDE) One tablet by mouth daily.  #30 x 2   Entered and Authorized by:   Sherren Mocha Dardan Shelton MD   Signed by:   Sherren Mocha Torien Ramroop MD on 07/28/2009   Method used:   Electronically to        [facility] [phone]* (retail)       [address]       [place], [place]  [address]       Ph: [phone]       Fax: [phone]   RxID:   [id number]   Appended Document: Change Chlorthalidone to Indapmide to get $4 Rx at [facility] patient notified.

## 2010-03-24 NOTE — Progress Notes (Signed)
Summary: Rx Prob  Phone Note Call from Patient Call back at [phone]   Caller: Patient Summary of Call: Pt would like the rx that was filled for him on 10/28/09 to be sent to [place]. Initial call taken by: Raymond Gurney,  October 30, 2009 2:30 PM  Follow-up for Phone Call        patient notified. Follow-up by: Marcell Barlow RN,  October 30, 2009 5:25 PM

## 2010-03-24 NOTE — Miscellaneous (Signed)
Summary: indapamide refill  Clinical Lists Changes  Medications: Rx of INDAPAMIDE 2.5 MG TABS (INDAPAMIDE) One tablet by mouth daily.;  #31 x 5;  Signed;  Entered by: Esmeralda Arthur MD;  Authorized by: Esmeralda Arthur MD;  Method used: [facility]  [address] [id number]*, [address] [place], [place], [place]  [address], Ph: [phone], Fax: [phone]    Prescriptions: INDAPAMIDE 2.5 MG TABS (INDAPAMIDE) One tablet by mouth daily.  #31 x 5   Entered and Authorized by:   Esmeralda Arthur MD   Signed by:   Esmeralda Arthur MD on 10/28/2009   Method used:   Electronically to        [facility] [id number]* (retail)       [address]       [place], [place]  [address]       Ph: [phone]       Fax: [phone]   RxID:   [id number]

## 2010-03-24 NOTE — Assessment & Plan Note (Signed)
Summary: HTN, Chronic renal insufficeinty, Hyperthyroidism.    Vital Signs:  Patient profile:   50 year old male Weight:      395.8 pounds Temp:     97.7 degrees F oral Pulse rate:   83 / minute BP sitting:   160 / 108  (left arm) Cuff size:   large  Vitals Entered By: Isabelle Course (Jul 17, 2009 8:45 AM)  Serial Vital Signs/Assessments:  Time      Position  BP       Pulse  Resp  Temp     By 8:50 AM             158/120                        Isabelle Course  Comments: 8:50 AM re checked manually By: Isabelle Course   CC: F/U HTN Is Patient Diabetic? No Pain Assessment Patient in pain? no        Primary Care Provider:  Esmeralda Arthur MD  CC:  F/U HTN.  History of Present Illness: Hypertension: Pt comes in today to discuss and adjust his BP meds. He was put on Lasix and Lisinopril last time he was in here but his meds are going to be changed this time based on his recent labs. His Cr has gone up and his blood pressure is still high (although better than before). This patient is continueing to walk 3 days a week for 30 mins. He says he is very motivated to lose weight and get his BP undercontrol b/c his daughter is graduating at the top of her class and getting scholarships and going to college and he wants to be here to be a part of her life. I told him we will have to see each other often to get his BP under control.   Chronic Renal insuff. Pt has deteriorated Cr. See above.  Hypothyroidism; Pt has no family h/o low thryoid. He was tested last time and found to have low thyroid. Will plan to test again and confirm.   Habits & Providers  Alcohol-Tobacco-Diet     Tobacco Status: never  Current Medications (verified): 1)  Coreg 25 Mg  Tabs (Carvedilol) .... Take One Tablet By Mouth Two Times A Day 2)  Aspirin 81 Mg Chew (Aspirin) .... Take One Pill A Day 3)  Metoprolol Tartrate 50 Mg Tabs (Metoprolol Tartrate) .... Take 1 Pill Every 12 Hours  Allergies (verified): No  Known Drug Allergies  Social History: Smoking Status:  never  Review of Systems        vitals reviewed and pertinent negatives and positives seen in HPI   Physical Exam  General:  Well-developed,well-nourished,in no acute distress; alert,appropriate and cooperative throughout examination Lungs:  Normal respiratory effort, chest expands symmetrically. Lungs are clear to auscultation, no crackles or wheezes. Heart:  Normal rate and regular rhythm. S1 and S2 normal without gallop, murmur, click, rub or other extra sounds. Abdomen:  obese Extremities:  3+ pitting edema on lower extremities bilaterall. Left worse than right.  Skin:  chronic venous stasis dermatitis on bilateral lower extremities left worse than right.    Impression & Recommendations:  Problem # 1:  HYPERTENSION, UNCONTROLLED (ICD-401.9) Assessment Improved The patient's blood pressure has improved slightly but his cr has worsened being on Lasix and Enalapril. We will hold these meds until I can get his BP under control and then in the future maybe I will be able to  use the Lasix again to get some fluid off his legs.   The following medications were removed from the medication list:    Enalapril Maleate 20 Mg Tabs (Enalapril maleate) .Marland Kitchen... Take one tablet by mouth daily. make an appointment with your doctor to follow up your blood pressure.    Furosemide 40 Mg Tabs (Furosemide) .Marland Kitchen... Take one pill daily His updated medication list for this problem includes:    Coreg 25 Mg Tabs (Carvedilol) .Marland Kitchen... Take one tablet by mouth two times a day    Metoprolol Tartrate 50 Mg Tabs (Metoprolol tartrate) .Marland Kitchen... Take 1 pill every 12 hours  Orders: [place]- Est Level  3 [id number])  Problem # 2:  RENAL INSUFFICIENCY, CHRONIC (ICD-585.9) Assessment: Deteriorated Pt has a h/o chronic renal insufficieny but it was acutely worse with Lasix and Enalapril. Plan to hold these meds. Will recheck his Cr in 1 week after being off these meds when he  comes back to get his BP checked.    Orders: [place]- Est Level  3 ([id number])Future Orders: Basic Met-[facility] [id number]) ... 07/02/2010  Problem # 3:  UNSPECIFIED HYPOTHYROIDISM (ICD-244.9) Assessment: New PT was shown to have elevated TSH at his last blood draw. Will confirm this test with a repeat TSH in 1 week. If still elevated will plan to treat with Synthroid.   Orders: [facility]- Est Level  3 ([id number])Future Orders: TSH-[facility] [id number]) ... 07/09/2010  Complete Medication List: 1)  Coreg 25 Mg Tabs (Carvedilol) .... Take one tablet by mouth two times a day 2)  Aspirin 81 Mg Chew (Aspirin) .... Take one pill a day 3)  Metoprolol Tartrate 50 Mg Tabs (Metoprolol tartrate) .... Take 1 pill every 12 hours   Patient Instructions: 1)  RTC in 1 week to have your BP checked by nurse. 2)  Get blood work done that day. You don't have to be fasting.  3)  Stop the Lasix (furosemide) and Lisinopril because they are affecting your kidney.  4)  Drink 8-10 glasses of water a day. Keep walking, elevate legs at night.  Prescriptions: METOPROLOL TARTRATE 50 MG TABS (METOPROLOL TARTRATE) take 1 pill every 12 hours  #60 x 6   Entered and Authorized by:   Esmeralda Arthur MD   Signed by:   Esmeralda Arthur MD on 07/17/2009   Method used:   Electronically to        [facility] [phone]* (retail)       [address]       [place], [place]  [address]       Ph: [phone]       Fax: [phone]   RxID:   [id number]

## 2010-03-24 NOTE — Miscellaneous (Signed)
Summary: Medication clarification and adjustment  Clinical Lists Changes  Medications: Removed medication of METOPROLOL TARTRATE 50 MG TABS (METOPROLOL TARTRATE) take 1 pill every 12 hours Added new medication of AMLODIPINE BESYLATE 10 MG TABS (AMLODIPINE BESYLATE) take 1 pill daily for blood pressure - Signed Rx of AMLODIPINE BESYLATE 10 MG TABS (AMLODIPINE BESYLATE) take 1 pill daily for blood pressure;  #31 x 3;  Signed;  Entered by: Esmeralda Arthur MD;  Authorized by: Esmeralda Arthur MD;  [facility]  [address] [id number]*, [address] [place], [place], [place]  [address], Ph: [phone], Fax: [phone]    Prescriptions: AMLODIPINE BESYLATE 10 MG TABS (AMLODIPINE BESYLATE) take 1 pill daily for blood pressure  #31 x 3   Entered and Authorized by:   Esmeralda Arthur MD   Signed by:   Esmeralda Arthur MD on 07/24/2009   Method used:   Electronically to        [facility] [id number]* (retail)       [address]       [place], [place]  [address]       Ph: [phone]       Fax: [phone]   RxID:   [id number]  Based off of the nursing visit today the patient is still having very high blood pressures. He was also on 2 beta-blockers and still taking a prior Lasix 20 mg prescription that I did not give him. I have stopped his metoprolol, started Norvasc 10 mg, and will consider starting Clonidine in 1 week if his BP's are not under control. He has had to be on Clonidine inte past. Will await lab results to check on his kidney function.  Esmeralda Arthur MD  July 24, 2009 10:28 PM  Appended Document: Medication clarification and adjustment pt notified of changes to meds and agreed he will go pick up meds from pharmacy and rtc on 06.06.2011.  rx called in to [facility] [address].  Pt can be reached at [phone]

## 2010-03-24 NOTE — Assessment & Plan Note (Signed)
Summary: HTN, Obesity   Vital Signs:  Patient profile:   50 year old male Weight:      376.1 pounds Temp:     98.4 degrees F oral Pulse rate:   75 / minute Pulse rhythm:   regular BP sitting:   161 / 103  (left arm) Cuff size:   large  Vitals Entered By: Audelia Hives CMA (August 22, 2009 3:58 PM)  Primary Care Provider:  Esmeralda Arthur MD   History of Present Illness: hypertension: Pt has been struggling to get his hypertension under control for a few months now. We have tried many medications and it is a little better but not in optimal range. Plan to go back to a medicine he use to be on 2 years ago (Clonidine). Hopefully this will get him in optimal range. Pt told me that he was trying to get disability when he found out his BP was so high. Now the disability people what to know if his BP is under control. I told him that I felt it was still out of control but that it would be in control soon with the addition of the Clonidine.   Obestiy: Pt is morbidly obese but is taking strides to lose weight. He is doing better than most of my patients. he has cut out [employer] and he is walking. He has lost 18 lbs since we started working together.   Habits & Providers  Alcohol-Tobacco-Diet     Tobacco Status: never  Current Medications (verified): 1)  Coreg 25 Mg  Tabs (Carvedilol) .... Take One Tablet By Mouth Two Times A Day 2)  Aspirin 81 Mg Chew (Aspirin) .... Take One Pill A Day 3)  Amlodipine Besylate 10 Mg Tabs (Amlodipine Besylate) .... Take 1 Pill Daily For Blood Pressure 4)  Indapamide 2.5 Mg Tabs (Indapamide) .... One Tablet By Mouth Daily. 5)  Catapres 0.1 Mg Tabs (Clonidine Hcl) .... Take 1 Pill A Day For 1 Week, Then Come Get Bp Checked, If > 140 Take 2 Pills A Day Until Seen.  Allergies (verified): No Known Drug Allergies  Review of Systems        vitals reviewed and pertinent negatives and positives seen in HPI   Physical Exam  General:  morbidly obese Psych:   Cognition and judgment appear intact. Alert and cooperative with normal attention span and concentration. No apparent delusions, illusions, hallucinations   Impression & Recommendations:  Problem # 1:  HYPERTENSION, UNCONTROLLED (ICD-401.9) Assessment Improved Pt's BP is a little better today but not "controlled". Would like to see his BP at least less than 140. Plan to add on Clonidine to get a little better control. The patient use to be on Clonidine 2 years ago and maybe this is the medicine he needs to be on to get optimal control. Pt will come back next Friday for a BP check and to get blood drawn. Then see me in 2 weeks from today.   His updated medication list for this problem includes:    Coreg 25 Mg Tabs (Carvedilol) .Marland Kitchen... Take one tablet by mouth two times a day    Amlodipine Besylate 10 Mg Tabs (Amlodipine besylate) .Marland Kitchen... Take 1 pill daily for blood pressure    Indapamide 2.5 Mg Tabs (Indapamide) ..... One tablet by mouth daily.    Catapres 0.1 Mg Tabs (Clonidine hcl) .Marland Kitchen... Take 1 pill a day for 1 week, then come get bp checked, if > 140 take 2 pills a day until  seen.  Orders: [facility]- Est Level  3 ([id number])Future Orders: Basic Met-[facility] [id number]) ... 10/01/2010  Problem # 2:  OBESITY, UNSPECIFIED (ICD-278.00) Assessment: Improved Pt has lost 18 lbs and is doing well. he is walking and stopped eating at [employer].   Orders: [facility]- Est Level  3 [id number])  Complete Medication List: 1)  Coreg 25 Mg Tabs (Carvedilol) .... Take one tablet by mouth two times a day 2)  Aspirin 81 Mg Chew (Aspirin) .... Take one pill a day 3)  Amlodipine Besylate 10 Mg Tabs (Amlodipine besylate) .... Take 1 pill daily for blood pressure 4)  Indapamide 2.5 Mg Tabs (Indapamide) .... One tablet by mouth daily. 5)  Catapres 0.1 Mg Tabs (Clonidine hcl) .... Take 1 pill a day for 1 week, then come get bp checked, if > 140 take 2 pills a day until seen.  Patient Instructions: 1)  It was good to see you today. i  hope you have a wonderful 4th of July.  2)  Pick up the Clonidine.  3)  Come back in 1 week to get your BP checked.  4)  If you BP is > 140 on the top number you can double your Clonidine pills.  5)  I want to see you in 2 weeks.  Prescriptions: CATAPRES 0.1 MG TABS (CLONIDINE HCL) Take 1 pill a day for 1 week, then come get BP checked, if > 140 take 2 pills a day until seen.  #60 x 6   Entered and Authorized by:   Esmeralda Arthur MD   Signed by:   Esmeralda Arthur MD on 08/22/2009   Method used:   Electronically to        [facility] [phone]* (retail)       [address]       [place], [place]  [address]       Ph: [phone]       Fax: [phone]   RxID:   [id number]

## 2010-03-24 NOTE — Assessment & Plan Note (Signed)
Summary: BP, ED follow up.   Vital Signs:  Patient profile:   50 year old male Weight:      375 pounds O2 Sat:      97 % on Room air Temp:     97.5 degrees F oral BP sitting:   156 / 118  (left arm) Cuff size:   large  Vitals Entered By: Audelia Hives CMA (September 05, 2009 3:11 PM)  O2 Flow:  Room air CC: HTN, chest congestion Comments pt went to ED tuesday due to chest congestion.    Primary Care Provider:  Esmeralda Arthur MD  CC:  HTN and chest congestion.  History of Present Illness: hypertension: Pt has been seeing me regularily so we can work on his BP. He initially came to the clinic with SBP's in the low 200's. We have recently added clonodine to his medication regimine and it apperas to be working well. He occasionally takes his BP at [facility] and says that the last few times it was taken it was 150/90 and 142/99. He is feeling better. No adverse side effects noted.   Chest congestion: Pt recently went to the ED with chest congestion that made him feel like he could not breathe. He does not have a h/o asthma. But they put him on albuterol which he is taking 2x/day and they put him on Prednisone. He is feeling much better. Says he hasn't had anything like this in 8 years.  BNP in ED was found to be 248. Pt diagnosed with asthma type symptoms and given and inhaler. CE's were negative.   Habits & Providers  Alcohol-Tobacco-Diet     Tobacco Status: never  Current Medications (verified): 1)  Coreg 25 Mg  Tabs (Carvedilol) .... Take One Tablet By Mouth Two Times A Day 2)  Aspirin 81 Mg Chew (Aspirin) .... Take One Pill A Day 3)  Amlodipine Besylate 10 Mg Tabs (Amlodipine Besylate) .... Take 1 Pill Daily For Blood Pressure 4)  Indapamide 2.5 Mg Tabs (Indapamide) .... One Tablet By Mouth Daily. 5)  Catapres 0.1 Mg Tabs (Clonidine Hcl) .... Take 1 Pill A Day For 1 Week, Then Come Get Bp Checked, If > 140 Take 2 Pills A Day Until Seen.  Allergies (verified): No Known Drug  Allergies  Review of Systems       chest congestion, shortness of breath,   Physical Exam  General:  vital signs reviewed.  morbidly obese, in no acute distress; alert,appropriate and cooperative throughout examination Lungs:  pt had normal respiratory effort and no accessory muscle use.     Impression & Recommendations:  Problem # 1:  HYPERTENSION, UNCONTROLLED (ICD-401.9) Assessment Unchanged Pts BP is about the same as it was at the last visit, although he says that his BP's are better when self-checking. No plan to change his regimine today. As he continues to eat right and exercise he his BP should continue to come down. Pt is to make an appointment with nutrition clinic to asses for dietary causes for hypertension. Appt is July 28th. Will need to monitor potassium with him on a thaizide.   His updated medication list for this problem includes:    Coreg 25 Mg Tabs (Carvedilol) .Marland Kitchen... Take one tablet by mouth two times a day    Amlodipine Besylate 10 Mg Tabs (Amlodipine besylate) .Marland Kitchen... Take 1 pill daily for blood pressure    Indapamide 2.5 Mg Tabs (Indapamide) ..... One tablet by mouth daily.    Catapres 0.1 Mg Tabs (  Clonidine hcl) .Marland Kitchen... Take 1 pill a day for 1 week, then come get bp checked, if > 140 take 2 pills a day until seen.  Orders: [place]- Est Level  3 [id number])  Problem # 2:  SHORTNESS OF BREATH (ICD-786.05) Assessment: Comment Only Pt was seen in the ED on Jul 12. He was diagnosed with an asthma like reaction and given albuterol and prednisone. He had an elevated BNP. Will need to address this at the next visit. May need cardiology referral for ECHO.   Orders: [place]- Est Level  3 [id number])  Complete Medication List: 1)  Coreg 25 Mg Tabs (Carvedilol) .... Take one tablet by mouth two times a day 2)  Aspirin 81 Mg Chew (Aspirin) .... Take one pill a day 3)  Amlodipine Besylate 10 Mg Tabs (Amlodipine besylate) .... Take 1 pill daily for blood pressure 4)  Indapamide 2.5 Mg  Tabs (Indapamide) .... One tablet by mouth daily. 5)  Catapres 0.1 Mg Tabs (Clonidine hcl) .... Take 1 pill a day for 1 week, then come get bp checked, if > 140 take 2 pills a day until seen.

## 2010-03-26 ENCOUNTER — Ambulatory Visit: Admit: 2010-03-26 | Payer: Self-pay

## 2010-03-26 ENCOUNTER — Encounter: Payer: Self-pay | Admitting: Family Medicine

## 2010-03-26 ENCOUNTER — Other Ambulatory Visit (INDEPENDENT_AMBULATORY_CARE_PROVIDER_SITE_OTHER): Payer: Self-pay

## 2010-03-26 DIAGNOSIS — Z7901 Long term (current) use of anticoagulants: Secondary | ICD-10-CM

## 2010-03-26 DIAGNOSIS — I2699 Other pulmonary embolism without acute cor pulmonale: Secondary | ICD-10-CM

## 2010-03-26 LAB — CONVERTED CEMR LAB: INR: 2

## 2010-04-04 ENCOUNTER — Encounter: Payer: Self-pay | Admitting: Family Medicine

## 2010-04-04 DIAGNOSIS — I2699 Other pulmonary embolism without acute cor pulmonale: Secondary | ICD-10-CM

## 2010-04-04 DIAGNOSIS — Z7901 Long term (current) use of anticoagulants: Secondary | ICD-10-CM

## 2010-04-23 ENCOUNTER — Ambulatory Visit (INDEPENDENT_AMBULATORY_CARE_PROVIDER_SITE_OTHER): Payer: Self-pay | Admitting: *Deleted

## 2010-04-23 DIAGNOSIS — I2699 Other pulmonary embolism without acute cor pulmonale: Secondary | ICD-10-CM

## 2010-04-23 DIAGNOSIS — Z7901 Long term (current) use of anticoagulants: Secondary | ICD-10-CM

## 2010-04-23 LAB — PROTIME-INR: INR: 2.5 — AB (ref 0.9–1.1)

## 2010-05-07 LAB — POCT I-STAT, CHEM 8
BUN: 31 mg/dL — ABNORMAL HIGH (ref 6–23)
Chloride: 109 mEq/L (ref 96–112)
Creatinine, Ser: 2.4 mg/dL — ABNORMAL HIGH (ref 0.4–1.5)
Glucose, Bld: 93 mg/dL (ref 70–99)
Hemoglobin: 13.9 g/dL (ref 13.0–17.0)
Potassium: 3.9 mEq/L (ref 3.5–5.1)

## 2010-05-07 LAB — COMPREHENSIVE METABOLIC PANEL
ALT: 17 U/L (ref 0–53)
AST: 15 U/L (ref 0–37)
AST: 18 U/L (ref 0–37)
Albumin: 2.7 g/dL — ABNORMAL LOW (ref 3.5–5.2)
Alkaline Phosphatase: 57 U/L (ref 39–117)
Alkaline Phosphatase: 62 U/L (ref 39–117)
BUN: 27 mg/dL — ABNORMAL HIGH (ref 6–23)
CO2: 23 mEq/L (ref 19–32)
CO2: 25 mEq/L (ref 19–32)
Chloride: 103 mEq/L (ref 96–112)
Chloride: 106 mEq/L (ref 96–112)
Creatinine, Ser: 2.32 mg/dL — ABNORMAL HIGH (ref 0.4–1.5)
GFR calc non Af Amer: 30 mL/min — ABNORMAL LOW (ref >60)
GFR calc non Af Amer: 32 mL/min — ABNORMAL LOW (ref >60)
Glucose, Bld: 126 mg/dL — ABNORMAL HIGH (ref 70–99)
Potassium: 3.4 mEq/L — ABNORMAL LOW (ref 3.5–5.1)
Potassium: 3.7 mEq/L (ref 3.5–5.1)
Sodium: 137 mEq/L (ref 135–145)
Total Bilirubin: 0.7 mg/dL (ref 0.3–1.2)
Total Bilirubin: 0.8 mg/dL (ref 0.3–1.2)

## 2010-05-07 LAB — CBC
HCT: 38.6 % — ABNORMAL LOW (ref 39.0–52.0)
HCT: 38.9 % — ABNORMAL LOW (ref 39.0–52.0)
Hemoglobin: 12.8 g/dL — ABNORMAL LOW (ref 13.0–17.0)
Hemoglobin: 13.2 g/dL (ref 13.0–17.0)
Hemoglobin: 13.3 g/dL (ref 13.0–17.0)
MCH: 27.8 pg (ref 26.0–34.0)
MCH: 28.2 pg (ref 26.0–34.0)
MCH: 28.4 pg (ref 26.0–34.0)
MCHC: 34.2 g/dL (ref 30.0–36.0)
MCV: 81.1 fL (ref 78.0–100.0)
MCV: 81.4 fL (ref 78.0–100.0)
MCV: 82.4 fL (ref 78.0–100.0)
Platelets: 294 10*3/uL (ref 150–400)
RBC: 4.5 MIL/uL (ref 4.22–5.81)
RBC: 4.72 MIL/uL (ref 4.22–5.81)
RBC: 4.74 MIL/uL (ref 4.22–5.81)
RDW: 15.8 % — ABNORMAL HIGH (ref 11.5–15.5)
WBC: 5.9 10*3/uL (ref 4.0–10.5)
WBC: 6.7 10*3/uL (ref 4.0–10.5)
WBC: 7.4 10*3/uL (ref 4.0–10.5)

## 2010-05-07 LAB — DIFFERENTIAL
Basophils Absolute: 0 10*3/uL (ref 0.0–0.1)
Basophils Relative: 0 % (ref 0–1)
Eosinophils Absolute: 0.3 10*3/uL (ref 0.0–0.7)
Eosinophils Relative: 4 % (ref 0–5)
Lymphocytes Relative: 20 % (ref 12–46)
Lymphs Abs: 1.4 10*3/uL (ref 0.7–4.0)
Monocytes Absolute: 0.5 10*3/uL (ref 0.1–1.0)
Monocytes Relative: 7 % (ref 3–12)
Neutro Abs: 5.2 10*3/uL (ref 1.7–7.7)
Neutrophils Relative %: 70 % (ref 43–77)

## 2010-05-07 LAB — URINALYSIS, ROUTINE W REFLEX MICROSCOPIC
Bilirubin Urine: NEGATIVE
Glucose, UA: NEGATIVE mg/dL
Ketones, ur: NEGATIVE mg/dL
Protein, ur: NEGATIVE mg/dL
pH: 6 (ref 5.0–8.0)

## 2010-05-07 LAB — PROTIME-INR
INR: 1.13 (ref 0.00–1.49)
Prothrombin Time: 14.7 seconds (ref 11.6–15.2)

## 2010-05-07 LAB — RAPID URINE DRUG SCREEN, HOSP PERFORMED: Barbiturates: NOT DETECTED

## 2010-05-07 LAB — D-DIMER, QUANTITATIVE: D-Dimer, Quant: 3.46 ug/mL-FEU — ABNORMAL HIGH (ref 0.00–0.48)

## 2010-05-07 LAB — BRAIN NATRIURETIC PEPTIDE: Pro B Natriuretic peptide (BNP): 146 pg/mL — ABNORMAL HIGH (ref 0.0–100.0)

## 2010-05-10 LAB — CBC
HCT: 39.2 % (ref 39.0–52.0)
Hemoglobin: 13.1 g/dL (ref 13.0–17.0)
MCH: 27.9 pg (ref 26.0–34.0)
MCV: 83.7 fL (ref 78.0–100.0)
Platelets: 249 10*3/uL (ref 150–400)
RBC: 4.69 MIL/uL (ref 4.22–5.81)
WBC: 8.3 10*3/uL (ref 4.0–10.5)

## 2010-05-10 LAB — POCT CARDIAC MARKERS: Troponin i, poc: <0.05 ng/mL (ref 0.00–0.09)

## 2010-05-10 LAB — DIFFERENTIAL
Eosinophils Absolute: 0.2 10*3/uL (ref 0.0–0.7)
Eosinophils Relative: 2 % (ref 0–5)
Lymphocytes Relative: 10 % — ABNORMAL LOW (ref 12–46)
Lymphs Abs: 0.9 10*3/uL (ref 0.7–4.0)
Monocytes Absolute: 0.7 10*3/uL (ref 0.1–1.0)
Monocytes Relative: 9 % (ref 3–12)

## 2010-05-10 LAB — COMPREHENSIVE METABOLIC PANEL
AST: 17 U/L (ref 0–37)
Albumin: 2.7 g/dL — ABNORMAL LOW (ref 3.5–5.2)
Alkaline Phosphatase: 57 U/L (ref 39–117)
BUN: 24 mg/dL — ABNORMAL HIGH (ref 6–23)
GFR calc Af Amer: 42 mL/min — ABNORMAL LOW (ref >60)
Potassium: 3.3 mEq/L — ABNORMAL LOW (ref 3.5–5.1)
Total Protein: 7.5 g/dL (ref 6.0–8.3)

## 2010-05-10 LAB — BRAIN NATRIURETIC PEPTIDE: Pro B Natriuretic peptide (BNP): 248 pg/mL — ABNORMAL HIGH (ref 0.0–100.0)

## 2010-05-18 ENCOUNTER — Other Ambulatory Visit: Payer: Self-pay | Admitting: Family Medicine

## 2010-05-18 MED ORDER — INDAPAMIDE 2.5 MG PO TABS: 2.5000 mg | ORAL_TABLET | Freq: Every day | ORAL | Status: DC

## 2010-05-21 ENCOUNTER — Ambulatory Visit (INDEPENDENT_AMBULATORY_CARE_PROVIDER_SITE_OTHER): Payer: Medicaid Other | Admitting: *Deleted

## 2010-05-21 DIAGNOSIS — Z7901 Long term (current) use of anticoagulants: Secondary | ICD-10-CM

## 2010-05-21 DIAGNOSIS — I2699 Other pulmonary embolism without acute cor pulmonale: Secondary | ICD-10-CM

## 2010-06-04 ENCOUNTER — Ambulatory Visit (INDEPENDENT_AMBULATORY_CARE_PROVIDER_SITE_OTHER): Payer: Medicaid Other | Admitting: *Deleted

## 2010-06-04 DIAGNOSIS — I2699 Other pulmonary embolism without acute cor pulmonale: Secondary | ICD-10-CM

## 2010-06-04 DIAGNOSIS — Z7901 Long term (current) use of anticoagulants: Secondary | ICD-10-CM

## 2010-06-04 LAB — POCT INR: INR: 2.6

## 2010-06-16 ENCOUNTER — Other Ambulatory Visit: Payer: Self-pay | Admitting: Family Medicine

## 2010-06-16 NOTE — Telephone Encounter (Signed)
Refill request

## 2010-07-02 ENCOUNTER — Ambulatory Visit (INDEPENDENT_AMBULATORY_CARE_PROVIDER_SITE_OTHER): Payer: Medicaid Other | Admitting: *Deleted

## 2010-07-02 DIAGNOSIS — Z7901 Long term (current) use of anticoagulants: Secondary | ICD-10-CM

## 2010-07-02 DIAGNOSIS — I2699 Other pulmonary embolism without acute cor pulmonale: Secondary | ICD-10-CM

## 2010-07-02 LAB — POCT INR: INR: 2.4

## 2010-07-08 ENCOUNTER — Other Ambulatory Visit: Payer: Self-pay | Admitting: Family Medicine

## 2010-07-08 MED ORDER — INDAPAMIDE 2.5 MG PO TABS: 2.5000 mg | ORAL_TABLET | Freq: Every day | ORAL | Status: DC

## 2010-07-10 NOTE — Discharge Summary (Signed)
NAMEJAHMIERE, Johnny [name]                           ACCOUNT NO.:  [id number]   MEDICAL RECORD NO.:  [id number]                   PATIENT TYPE:  INP   LOCATION:  [place]                                 FACILITY:  [facility]   PHYSICIAN:  Johnny [name], M.D.                DATE OF BIRTH:  08-29-1960   DATE OF ADMISSION:  12/30/2002  DATE OF DISCHARGE:  01/01/2003                                 DISCHARGE SUMMARY   DISCHARGE DIAGNOSES:  1. Congestive heart failure secondary to hypertension, resolved.  2. Hypertension, controlled.  3. Polysubstance abuse, alcohol and cocaine.   DISCHARGE MEDICATIONS:  1. Enalapril 5 mg p.o. daily.  2. Norvasc 10 mg p.o. daily.  3. Hydrochlorothiazide 25 mg p.o. daily.   DISCHARGE INSTRUCTIONS:  1. Low salt, 2 gram sodium, low fat diet.  Eat four to five small meals     rather than three heavy meals a day.  2. Limit alcohol intake.   SPECIAL INSTRUCTIONS:  Call your doctor if you have shortness of breath,  swelling in the hands, feet or stomach, or if you have to sleep on extra  pillows at night in order to breathe, or if you have a three to four pound  weight gain in one to two days.   FOLLOW UP:  With Johnny [name] on January 14, 2003 at 1:55 p.m. at the  family practice center.   REVIEW OF HISTORY:  Johnny [name] is a 50 year old African-American male who is  a known hypertensive who was admitted for increasing shortness of breath  over the past three days which is aggravated by exertion.  This was  accompanied by dyspnea and orthopnea.  The patient also complained of weight  gain and swelling in the lower extremities.  The patient stopped taking his  antihypertensive about five months ago because of financial constraints.  He  had cocaine one week ago.  The patient reported taking alcohol with binge  drinking two days ago.   PHYSICAL EXAMINATION ON ADMISSION:  VITAL SIGNS:  Blood pressure 224/124,  pulse 102 to 112, respiration 36 per minute,  temperature 98, O2 saturation  98% on room air.  GENERAL:  Awake, coherent, in moderate respiratory distress.  HEENT:  PERRLA. No bronchial or pharyngeal congestion.  Tympanic membranes  clear.  NECK:  No lymphadenopathy, no carotid bruit, no thyromegaly.  CARDIOVASCULAR:  Tachycardic.  Distant heart sounds, no murmurs.  RESPIRATORY:  Decreased breath sounds at both basal lung [name], positive  for bibasilar crackles in mid to lower lung [name] bilaterally.  With  labored breathing.  ABDOMEN:  Moderately distended, positive bowel sounds, no masses, no  tenderness.  EXTREMITIES:  Bipedal edema +2.  No cyanosis.  Capillary refill less than  two seconds.  Large calluses on both heels.  Dry skin.  NEUROLOGIC:  Cranial nerves II-XII grossly intact.  Sensory intact.  Motor  5/5 in both  upper and lower extremities.   LABORATORY DATA ON ADMISSION:  Hemoglobin 12.4, hematocrit 37.1, WBC 12,600,  platelets 388,000.  Sodium 137, potassium 5.9, chloride 109, BUN 17,  creatinine 1.5, glucose 93.  Cardiac markers, number one:  Myoglobin 405, CK-  MB 25.9, then 22.1, troponin less than 0.05 then less than 0.05.  EKG sinus  rhythm, Q waves in V1, V2, unchanged from EKG done in October, 2003.  Chest  x-ray showed congestive heart failure changes.  BNP 357.  Urine drug screen  positive for opiates and cocaine.  TSH 2.242.  Alcohol level less than 5.   HOSPITAL COURSE:  Problem 1.  Congestive heart failure exacerbation due to  hypertension. I&O's were monitored.  The patient was given Lasix 60 mg IV  every 12 hours with note of good diuresis.  On the second hospital day the  patient had improvement of his breathing.  He reported less shortness of  breath, less dyspnea.  On physical examination there was note of decrease in  the rales in both lung [name]. The patient was then started on  hydrochlorothiazide 12.5 mg every 12 hours to further promote diuresis.  On  the day of discharge the patient was  symptomatic with no rales on  auscultation of his lungs.   Problem 2.  Hypertensive emergency.  The patient was started on  nitroglycerin drip at 10 mcg/minute.  On the second hospital day the  nitroglycerin drip was discontinued and the patient was started on enalapril  2.5 mg p.o. every 12 hours, Norvasc 10 mg p.o. once daily.  Blood pressure  was monitored and was controlled with these medications.  Blood pressure  ranged from 135 to 160 over 65 to 90.  Prior to discharge enalapril and HCTZ  were both changed to once a day dosing to help his compliance.  During  service rounds it was suggested that if the patient stays off cocaine,  Norvasc can be changed to a beta blocker which is a cheaper alternative.   Problem 3.  Polysubstance abuse.  The patient is a known cocaine and alcohol  user.  I discussed with the patient the need for him to stop alcohol and  cocaine abuse.  According to the patient he suspects this hospitalization  was a wake up call.  He was referred to the care manager who presented  resources in the community to deal with his substance abuse.  The patient  will need support in his desire to discontinue cocaine and alcohol use.  During the hospital stay he did not present with any signs of agitation or  any signs of alcohol withdrawal.   Problem 4.  Financial.  The patient presently does not have any benefits at  his place of employment.  The reason for his noncompliance with his  medication is due to financial constraints.  He was referred to the care  manager so he can fill fifty dollars of his medications with a [employer].   CONDITION ON DISCHARGE:  The patient was discharged asymptomatic with stable  vital signs.  On physical examination his lungs were clear with no note of  rales and no bipedal edema.  The patient was instructed to be compliant with  his medications and to stop alcohol and cocaine use.  He will follow up with Johnny [name] at the family  practice center in two week's time.      Johnny Hides, MD  Johnny [name], M.D.    MC/MEDQ  D:  01/01/2003  T:  01/02/2003  Job:  [id number]   cc:   Johnny [name], M.D.  Fam. Med - Resident - [place], [place] [address]  Fax: [phone]

## 2010-07-10 NOTE — H&P (Signed)
Johnny [name], Johnny [name]                           ACCOUNT NO.:  [id number]   MEDICAL RECORD NO.:  [id number]                   PATIENT TYPE:  INP   LOCATION:  1825                                 FACILITY:  [facility]   PHYSICIAN:  Jessy Oto. Fields, MD               DATE OF BIRTH:  Dec 02, 1960   DATE OF ADMISSION:  12/30/2002  DATE OF DISCHARGE:                                HISTORY & PHYSICAL   CHIEF COMPLAINT:  Shortness of breath.   HISTORY OF PRESENT ILLNESS:  The patient is a 50 year old African American  male who came in with progressively increased shortness of breath over the  last 3 days, especially with exertion. He has been unable to sleep due to  dyspnea and orthopnea. He reports weight gain and leg swelling. He has not  followed up at the family  practice center due to financial reasons and  losing his insurance. He stopped taking all of his medications about 5  months ago due to cough. He was admitted in October 2003 for chest pain and  CHF exacerbation, sent home on Norvasc, hydrochlorothiazide, Altace and  Lasix. He does report last using cocaine 1 week ago and reported binge  drinking alcohol this past Friday night.   REVIEW OF SYSTEMS:  CONSTITUTIONAL:  Denies fevers or chills, positive for  weight gain. CARDIOVASCULAR:  Denies chest pain or palpitations. Positive  for lower extremity edema. RESPIRATORY:  Positive for dyspnea and dry cough.  GI: No appetite. Denies nausea or vomiting or diarrhea, SKIN:  Denies  rash.  NEUROLOGIC:  Denies headache. GU:  Denies  trouble urinating. HEMATOLOGY:  Denies blood in the stool.   PAST MEDICAL HISTORY:  1. History of hypertensive emergency with admission October 2003.  2. Congestive heart failure with a left ventricular ejection fraction of 40%     to 50% by a 2D echocardiogram done October 2003 which also showed a     mildly dilated left ventricle with increased  wall thickness and mild     diffuse hypokinesis.  3. History of  cocaine abuse.   MEDICATIONS:  None.   ALLERGIES:  No known drug allergies.   FAMILY HISTORY:  Noncontributory.   SOCIAL HISTORY:  Positive for alcohol use, on average drinks 4 beers per day  with occasional binge drinking. Denies  tobacco use. Positive for cocaine  and marijuana use.   PHYSICAL EXAMINATION:  VITAL SIGNS:  Temperature 98.1, pulse 108 to 112,  respiratory rate 36, blood pressure 224/124, oxygen saturation 90% on room  air.  GENERAL:  Appears to be dyspneic at rest with conversational dyspnea.  Answers questions appropriately.  HEENT:  Normocephalic and atraumatic. Irises with dark brown rings  surrounding the irises bilaterally  without scleral icterus. Oropharynx pink  and moist without injection.  NECK:  Without thyromegaly or lymphadenopathy. No JVD present.  LUNGS:  Decreased breath sounds at the bases with  bibasilar crackles half  the way up bilaterally. Labored breathing with accessory muscle use of  abdomen, tachypneic with respiratory rate of 36.  CARDIOVASCULAR:  Distant heart sounds, regular rate and rhythm without  murmur, enlarged PMI, shifted to the right with 2+ nonpitting pedal edema  half way up the tibia.  SKIN:  Large calluses on both heels. Capillary refill less than 2 seconds  bilaterally.  ABDOMEN:  Moderately distended, nontender, no rebound or guarding or  rigidity. Tympanic to percussion and positive for bowel sounds. Liver and  spleen unable to palpate.  NEUROLOGIC:  Cranial nerves 2 to 12 grossly intact bilaterally. Answers  questions appropriately.   LABORATORY DATA:  ISTAT 8:  Sodium 137, potassium 5.9, chloride 109,  bicarbonate 24, BUN 17, glucose 9.3. pH 7.421, pCO2 37.7, bicarbonate 24.  Myoglobin 471, CK-MB 25.9, troponin I less than 0.05.   Portable chest x-ray shows CHF. An EKG shows sinus tachycardia with a heart  rate of  104. Q-waves in leads V1 and V2, unchanged from November 25, 2002.   ASSESSMENT AND PLAN:  A 50 year old  African American male admitted with a  CHF exacerbation with hypertensive emergency, volume overload, off  medications  and a history of binge alcohol  use.   PROBLEM #1, CHF EXACERBATION:  This is the cause of acute onset dyspnea by  chest x-ray. His BNP pending, but I expect it to be high. His last 2D echo  in October 2003 confirmed a mildly dilated left ventricle with an ejection  fraction of 40% to 50% with mild diffuse hypokinesis, likely secondary to  his longstanding history of alcohol use. He also  had a mildly thickened  left ventricle secondary to poorly controlled hypertension. Etiologies of  his CHF exacerbation can include an acute MI, thus we will rule him out with  cardiac enzymes, EKG.   It is noted that his first set of enzymes appears negative. His bump in  myoglobin is likely  secondary to recent  history of cocaine use. An EKG  shows no changes from previous, but likely he had a sustained cardiac  event  in the anterior wall in the past. Watch for development of chest pain;  currently he is chest pain free. He has had increased  cardiac strain from  his hypertension which is poorly controlled, causing increased  systemic  vascular resistance and further  cardiac  workload. He may also have cardiac  ischemia from recent cocaine use causing vasoconstriction and volume  overload from a high sodium  diet, off his medications of diuretics and  blood pressure reducers for the last 5 months and chronic  alcohol  use  contributing to the holiday heart syndrome.   I plan to diurese with Lasix, keep strict ins and outs, place back on his  ACE inhibitor after checking his creatinine. I would avoid beta blockers  given his active cocaine use. I will also check a TSH to rule out  hypothyroidism as a cause of his CHF exacerbation. He likely also needs  cardiac  risk stratification with a fasting lipid panel and will wait until the morning.   PROBLEM #2, HYPERTENSIVE EMERGENCY  WITH A BLOOD PRESSURE OF 224/124 ON  ADMISSION:  Started on a nitroglycerin drip and Clonidine. If his blood  pressure is still  high, will consider Labetalol. Other than that I would  place him back on his discharge  medications from last year including  hydrochlorothiazide, Norvasc and Altace. He will need long-term  blood  pressure control. Will discuss with case management accessibility to  medications.   PROBLEM #3, COCAINE ABUSE:  Check urine drug screen and plan to consult case  manager for community resources. Can definitely contribute to his  hypertensive emergency, which he is currently in. Notably he is chest pain  free and as stated above will avoid beta blockers.   PROBLEM #4, ALCOHOL USE, ABOUT4 DRINKS PER DAY PLUS BINGE DRINKING  AND  DISTENDED ABDOMEN:  Will check liver function tests on a CMET. Alcohol use  is likely  contributing to his diffuse hypokinesis as seen on 2D  echocardiogram in October 2003. Recommend alcohol cessation and may need  additional resources.   PROBLEM #5, GI PROPHYLAXIS:  Will start him on Protonix  40 mg p.o. every  day.   PROBLEM #6, DVT PROPHYLAXIS:  We will place SCDs on this gentleman as he is  at bedrest and high risk for DVTs.      Francesca Jewett, D.O.                         Jessy Oto. Fields, MD    KL/MEDQ  D:  12/30/2002  T:  12/30/2002  Job:  [id number]   cc:   Remo Lipps A. Lavella Hammock, M.D.  Fam. Med - Resident - [place], [place] [address]  Fax: [phone]

## 2010-07-10 NOTE — Discharge Summary (Signed)
Johnny [name], Johnny [name]                           ACCOUNT NO.:  [id number]   MEDICAL RECORD NO.:  [id number]                   PATIENT TYPE:  INP   LOCATION:  5531                                 FACILITY:  [facility]   PHYSICIAN:  Tammi Sou, M.D.             DATE OF BIRTH:  May 18, 1960   DATE OF ADMISSION:  11/22/2001  DATE OF DISCHARGE:  11/24/2001                                 DISCHARGE SUMMARY   ADMISSION DIAGNOSES:  1. Hypertensive emergency.  2. Pulmonary edema.  3. Cocaine abuse.   DISCHARGE DIAGNOSES:  1. Hypertensive emergency.  2. Pulmonary edema.  3. Cocaine abuse.  4. Left ventricular ejection fraction 40% to 50%.   DISCHARGE MEDICATIONS:  1. Hydrochlorothiazide 25 mg p.o. q.d.  2. Norvasc 10 mg p.o. q.d.  3. Altace 5 mg  p.o. q.d.  4. Aspirin 81 mg p.o. q.d.   CONSULTS:  Social work was consulted regarding substance abuse. The patient  was given information about local resources.   PROCEDURES:  A 2D echocardiogram on November 23, 2001. The results showed a  mildly dilated left ventricle with increased wall thickness and mild diffuse  hypokinesis. Ejection fraction was 40% to 50%.   HISTORY OF PRESENT ILLNESS:  For complete history and physical please see  resident history and physical in chart. Briefly this is a 50 year old  African-American man who did not have any health care who presented with  shortness of breath. He was found also to have chest pain and admitted to  using cocaine a couple of days prior to  presentation. Initial evaluation  revealed crackles in both lung bases and a blood pressure of 216/148. Oxygen  sats on room air were 88%. He was  not in any distress and his O2  saturations came up on 2 liters nasal cannula to 93%.   His initial laboratory work showed  a creatinine of 1.7 and a normal CBC and  the remainder of his electrolytes were normal. Initial cardiac enzymes  showed an elevated total CK of 1062 and an MB of 14 with a  negligible  relative index. Troponin was 0.03. An EKG showed normal sinus rhythm with Q-  waves in the anterior leads. There was no old EKG to compare. A CT scan of  the  chest showed no signs of a PE, and confirmed pulmonary edema. A chest x-  ray confirmed bilateral pulmonary edema.   HOSPITAL COURSE:  The patient was admitted for hypertensive emergency with  pulmonary edema and his hospital course was as follows:   PROBLEM #1, HYPERTENSIVE EMERGENCY:  He was  started on a nitroglycerin drip  and remained on this for about the first 24 hours of admission. His blood  pressures remained in the 160 to 180 over 100 to 110 range. He was switched  over to hydrochlorothiazide and Norvasc, and these were both  titrated up to  their maximum doses.  On the night prior to  discharge he still had blood  pressures in the 160/105 range and Altace 5 mg was added. On the morning of  discharge he was  feeling well, and his blood pressure was in the 140 to 160  over 90 to 100 range.   PROBLEM #2,  PULMONARY EDEMA:  This was likely secondary to increased left  ventricular strain with excessive afterload. He was  diuresed with Lasix and  pulmonary edema did clear. He was  weaned off oxygen. He was ruled out for  an MI. He had no difficulty with walking around the hospital ward and denied  shortness of breath or chest pain  with this.  A 2D echocardiogram was  obtained and showed left ventricular function and showed an EF of 40% to  50%, and showed an LVH with diffuse hypokinesis. He would likely benefit  from addition  of a beta blocker as an outpatient, but this was avoided  during this hospitalization because of recent cocaine use.   PROBLEM #3, COCAINE ABUSE:  The patient admitted  to cocaine use recently  and a urine drug screen was positive. He did admit that he did not want to  do this any more, and agreed to talk to a Education officer, museum. Peggye Ley  consulted with the patient  on November 23, 2001,  and discussed local  resources.   LABORATORY DATA:  Creatinine on admission 1.7, creatinine prior to  discharge 1.6. Urine drug screen on admission positive for cocaine,  otherwise negative. Admission hemoglobin 13.9, platelets 263. Admission  potassium 3.7. TSH 4.6. Fasting lipid profile:  Total cholesterol 201,  triglycerides 119, HDL 52, LDL 125.   DISPOSITION:  The patient was discharged to home  in improved condition on  the previously mentioned discharge medications.   DISCHARGE INSTRUCTIONS:  He was instructed to eat a low sodium diet.   FOLLOW UP:  Followup was arranged with Remo Lipps A. Lavella Hammock, M.D. at [facility] for December 07, 2001, at 1:45 p.m.  He was  given  prescriptions for his medications.                                                  Tammi Sou, M.D.    PHM/MEDQ  D:  11/24/2001  T:  11/28/2001  Job:  [id number]   cc:   Remo Lipps A. Lavella Hammock, M.D.  Fam. Med - Resident - [place], [place] [address]  Fax: [phone]

## 2010-07-10 NOTE — H&P (Signed)
Johnny [name], Johnny [name]                           ACCOUNT NO.:  [id number]   MEDICAL RECORD NO.:  [id number]                   PATIENT TYPE:  INP   LOCATION:  1825                                 FACILITY:  [facility]   PHYSICIAN:  Marshall L. Chambliss, M.D.         DATE OF BIRTH:  03/09/1960   DATE OF ADMISSION:  11/22/2001  DATE OF DISCHARGE:                                HISTORY & PHYSICAL   HISTORY OF PRESENT ILLNESS:  This is a 50 year old African-American male  without significant past medical history who stated he was going to  [employer] for dinner today, at around 9:30 p.m. had sudden onset of  shortness of breath.  It felt like he had something stuck in his chest,  substernal chest pain 8 out of 10.  The pain lasted for about 25 minutes and  subsided on its own.  States he felt like he had to force himself to  breathe.  This feeling lasted until he got to the hospital; however, the  chest pain subsided prior to getting to the hospital.  Denies ever having  had previous symptoms.   On initial questioning, the patient denied any drug use, but on further  questioning, he did state that he does use cocaine socially.  The last time  that he recalls doing it was two nights ago, but he states that he had been  around it yesterday, although he denied any personal ingestion, although  he states he had been smoking marijuana and using alcohol yesterday.   PAST MEDICAL HISTORY:  The patient states he has had hypertension in the  past, fluctuates per patient, no regular medical care for this.   MEDICATIONS:  None.   FAMILY HISTORY:  He has a brother with hypertension, a mother with  hypertension, end-stage renal disease, and deceased of breast cancer.  Father is alive at age 80 and without medical problems.  No history of early  myocardial infarction, no history of malignant hypertension in the family.   SOCIAL HISTORY:  No smoking.  Social drinker.  Positive history of cocaine  use  and does use marijuana.  The patient is engaged to be married.   PHYSICAL EXAMINATION:  VITAL SIGNS:  Temperature 98.4, initial blood  pressure 216/148 on manual cuff.  Repeat blood pressure 190/118.  Pulse 103,  respiratory rate 32, O2 saturation 93% on 2 liters, 88% on room air.  GENERAL:  Alert, mild respiratory distress.  PULMONARY:  Crackles bilaterally in lung fields, upper and lower.  CARDIOVASCULAR:  Tachycardic, irregular rhythm, no murmur, very distant.  HEENT:  Could not visualize retinas on direct funduscopic exam.  ABDOMEN:  Very obese, nontender, positive bowel sounds.  EXTREMITIES:  2+ pitting edema bilaterally.   LABORATORY DATA:  White count 6.7 with 47% polys, 24% neutrophils.  Hemoglobin 13.9, hematocrit 41.5, platelets 263.  Sodium 140, potassium 3.7,  chloride 108, bicarb 25, BUN 19, creatinine 1.7, glucose 121.  CK 1062, MB  14, troponin 0.03. D-dimer 0.92.   EKG: Sinus rhythm, no acute ST segment elevations or depressions.  No T wave  inversions.  Questionable Q in V1 and V2.   Chest x-ray:  Moderate bilateral pulmonary edema.   CT of chest negative for PE, bilateral pulmonary edema, questionable ARDS.   ASSESSMENT/PLAN:  This is a 50 year old African-American male who presents  in hypertensive crisis secondary to cocaine abuse with consequent pulmonary  edema.  With initial denial of cocaine, the patient was given labetalol IV  push in the ED.  When patient admitted to cocaine use, this was changed to a  nitroglycerin drip.  Unclear yet whether pulmonary edema is secondary to  cardiomyopathy induced by the cocaine or if it is part of the hypertensive  crisis.  Also, the chance of myocardial infarction is still fairly high in  this gentleman, can be any time in the 72 hours following ingestion.  Having  gotten the labetalol puts him at relatively higher risk for unopposed alpha  stimulation and worsening vasospasm. Will watch the patient very closely.  (EKG in  one hour after labetalol was given was unchanged from previously,  and blood pressure had decreased to 160s/80s.)  No signs of stroke at this  time.  Will hold off head CT unless there is a change in his neurological  status.   1. Hypertensive emergency.  Nitroglycerin drip at 10 mcg per minute for     blood pressure control.  Goal diastolics between 95 and [id number].  I would     keep patient on the nitroglycerin drip until his second set of cardiac     enzymes are back.  If he does need an additional blood pressure agent,     would add calcium channel blocker.  Once second set of enzymes are back     and if blood pressure is controlled, likely would change patient to oral     regimen.  Would favor calcium channel blocker.  Will rule out patient for     myocardial infarction with serial enzymes.  If there is any bump in the     serial enzymes, cardiology will be consulted immediately.  Will keep the     patient n.p.o. until a second set of enzymes are back.  2. Pulmonary edema.  Lasix 40 mg IV x 1 in the ED.  He is likely going to     need more Lasix as he has significant amount of edema. Will repeat chest     x-ray four hours after having given the Lasix, 2-D echocardiogram to     assess cardiac function, strict I's and O's.  3. Borderline renal function.  Creatinine is 1.7 on admission, and we gave     him and IV contrast load for his CT of the chest plus beginning Lasix,     and he has this hypertension.  Need to watch is creatinine closely with     all these stressors.  4. Substance abuse. Social work consulted.  Of note, the patient's fiance     does not know about his drug use, and patient wants this kept     confidential.  5. Prophylaxis.  Keeping patient n.p.o. until second set of cardiac enzymes     in case he needs a catheterization.  Protonix for gastrointestinal.     Tonia Brooms, M.D.  Marshall L. Erin Hearing, M.D.   Veatrice Bourbon  D:  11/22/2001  T:   11/24/2001  Job:  [id number]

## 2010-07-30 ENCOUNTER — Ambulatory Visit (INDEPENDENT_AMBULATORY_CARE_PROVIDER_SITE_OTHER): Payer: Medicaid Other | Admitting: *Deleted

## 2010-07-30 ENCOUNTER — Ambulatory Visit: Payer: Medicaid Other

## 2010-07-30 DIAGNOSIS — Z7901 Long term (current) use of anticoagulants: Secondary | ICD-10-CM

## 2010-07-30 DIAGNOSIS — I2699 Other pulmonary embolism without acute cor pulmonale: Secondary | ICD-10-CM

## 2010-08-10 ENCOUNTER — Ambulatory Visit (INDEPENDENT_AMBULATORY_CARE_PROVIDER_SITE_OTHER): Payer: Medicaid Other | Admitting: *Deleted

## 2010-08-10 DIAGNOSIS — I2699 Other pulmonary embolism without acute cor pulmonale: Secondary | ICD-10-CM

## 2010-08-10 DIAGNOSIS — Z7901 Long term (current) use of anticoagulants: Secondary | ICD-10-CM

## 2010-08-10 LAB — POCT INR: INR: 2.6

## 2010-09-07 ENCOUNTER — Ambulatory Visit (INDEPENDENT_AMBULATORY_CARE_PROVIDER_SITE_OTHER): Payer: Medicaid Other | Admitting: *Deleted

## 2010-09-07 DIAGNOSIS — Z7901 Long term (current) use of anticoagulants: Secondary | ICD-10-CM

## 2010-09-07 DIAGNOSIS — I2699 Other pulmonary embolism without acute cor pulmonale: Secondary | ICD-10-CM

## 2010-09-07 LAB — POCT INR: INR: 2.3

## 2010-10-05 ENCOUNTER — Ambulatory Visit (INDEPENDENT_AMBULATORY_CARE_PROVIDER_SITE_OTHER): Payer: Medicaid Other | Admitting: *Deleted

## 2010-10-05 DIAGNOSIS — I2699 Other pulmonary embolism without acute cor pulmonale: Secondary | ICD-10-CM

## 2010-10-05 DIAGNOSIS — Z7901 Long term (current) use of anticoagulants: Secondary | ICD-10-CM

## 2010-10-05 LAB — POCT INR: INR: 2.6

## 2010-11-02 ENCOUNTER — Ambulatory Visit (INDEPENDENT_AMBULATORY_CARE_PROVIDER_SITE_OTHER): Payer: Medicaid Other | Admitting: *Deleted

## 2010-11-02 DIAGNOSIS — Z7901 Long term (current) use of anticoagulants: Secondary | ICD-10-CM

## 2010-11-02 DIAGNOSIS — I2699 Other pulmonary embolism without acute cor pulmonale: Secondary | ICD-10-CM

## 2010-11-03 ENCOUNTER — Other Ambulatory Visit: Payer: Self-pay | Admitting: Family Medicine

## 2010-11-03 MED ORDER — CLONIDINE HCL 0.1 MG PO TABS: 0.1000 mg | ORAL_TABLET | Freq: Two times a day (BID) | ORAL | Status: DC

## 2010-11-03 MED ORDER — CARVEDILOL 25 MG PO TABS: 25.0000 mg | ORAL_TABLET | Freq: Two times a day (BID) | ORAL | Status: DC

## 2010-11-04 ENCOUNTER — Other Ambulatory Visit: Payer: Self-pay | Admitting: *Deleted

## 2010-11-04 MED ORDER — CARVEDILOL 25 MG PO TABS: 25.0000 mg | ORAL_TABLET | Freq: Two times a day (BID) | ORAL | Status: DC

## 2010-11-09 ENCOUNTER — Encounter: Payer: Self-pay | Admitting: Family Medicine

## 2010-11-09 ENCOUNTER — Ambulatory Visit (INDEPENDENT_AMBULATORY_CARE_PROVIDER_SITE_OTHER): Payer: Medicaid Other | Admitting: Family Medicine

## 2010-11-09 DIAGNOSIS — Z23 Encounter for immunization: Secondary | ICD-10-CM

## 2010-11-09 NOTE — Progress Notes (Signed)
  Subjective:    Patient ID: Johnny [name], male    DOB: 05/07/1960, 50 y.o.   MRN: [id number]  HPI 1. Obesity Patient has been putting on weight. He eats a lot of fast food and doesn't exercise. He does not smoke. He has no family history of heart disease.   2. Anticoagulation for hx of PE Patient is on coumadin because of a PE, which is had 7 monthes ago. He has no prior Thromboemobolus. He has no SOB. No bleeding evidence.  3. Medication refill.   Review of Systems  Respiratory: Negative for chest tightness and shortness of breath.   Cardiovascular: Negative for leg swelling.  Neurological: Negative for dizziness, syncope, facial asymmetry and headaches.   no fever, no rash,    Objective:   Physical Exam  Nursing note and vitals reviewed. Constitutional: He appears well-developed and well-nourished. No distress.       Obese   Skin: He is not diaphoretic.      Assessment & Plan:  1. Obesity - discussed diet and exercise with patient including multiple small meals throughout the day and a 1700 calorie diet.  2. Anticoagulation for hx of PE - current recs are for 6 months of anticoagulation. Discussed this with patient, he wanted to continue taking coumadin because of his morbid obesity and his fear of having another PE.  3. Medication refill.

## 2010-11-30 ENCOUNTER — Ambulatory Visit (INDEPENDENT_AMBULATORY_CARE_PROVIDER_SITE_OTHER): Payer: Medicaid Other | Admitting: *Deleted

## 2010-11-30 DIAGNOSIS — Z7901 Long term (current) use of anticoagulants: Secondary | ICD-10-CM

## 2010-11-30 DIAGNOSIS — I2699 Other pulmonary embolism without acute cor pulmonale: Secondary | ICD-10-CM

## 2010-12-28 ENCOUNTER — Ambulatory Visit (INDEPENDENT_AMBULATORY_CARE_PROVIDER_SITE_OTHER): Payer: Medicaid Other | Admitting: *Deleted

## 2010-12-28 DIAGNOSIS — Z7901 Long term (current) use of anticoagulants: Secondary | ICD-10-CM

## 2010-12-28 DIAGNOSIS — I2699 Other pulmonary embolism without acute cor pulmonale: Secondary | ICD-10-CM

## 2010-12-28 LAB — POCT INR: INR: 2.2

## 2011-01-25 ENCOUNTER — Ambulatory Visit: Payer: Medicaid Other

## 2011-02-15 ENCOUNTER — Other Ambulatory Visit: Payer: Self-pay | Admitting: Family Medicine

## 2011-02-17 NOTE — Telephone Encounter (Signed)
Refill request

## 2011-07-12 ENCOUNTER — Other Ambulatory Visit: Payer: Self-pay | Admitting: Family Medicine

## 2011-07-12 MED ORDER — INDAPAMIDE 2.5 MG PO TABS: 2.5000 mg | ORAL_TABLET | Freq: Every day | ORAL | Status: DC

## 2011-10-05 IMAGING — NM NM PULM PERFUSION & VENT (REBREATHING & WASHOUT)
2 series · 12 of 12 positions shown · non-contrast
Comparison: Chest radiograph from today

CLINICAL DATA: Shortness of breath.  Hypoxia.  Elevated D-dimer.
Renal insufficiency.

NUCLEAR MEDICINE VENTILATION - PERFUSION LUNG SCAN
TECHNIQUE: Wash-in, equilibrium, and wash-out phase ventilation
images were obtained using Ue-844 gas.  Perfusion images were
obtained in multiple projections after intravenous injection of Tc-
99m MAA.
Radiopharmaceuticals:  10 mCi Ue-844 gas and 6 mCi Oc-IIm MAA.

[vq scan · 2.52mm/px · 6 of 20 frames shown (1 of 2)]
[frame 2/20  full-range]
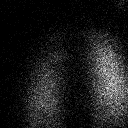
[frame 5/20]
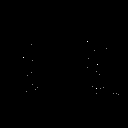
[frame 9/20]
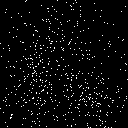
[frame 12/20]
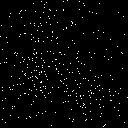
[frame 15/20]
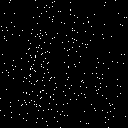
[frame 19/20]
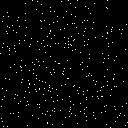

[vq scan · 2.52mm/px · 6 of 20 frames shown (2 of 2)]
[frame 2/20  full-range]
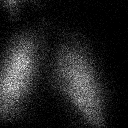
[frame 5/20  full-range]
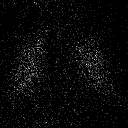
[frame 9/20]
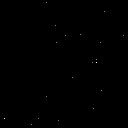
[frame 12/20]
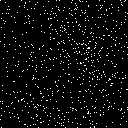
[frame 15/20]
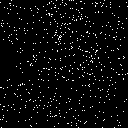
[frame 19/20]
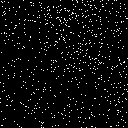

[12 of 12 positions shown; findings below may reference images not displayed]

FINDINGS: Ventilation images show homogeneous distribution of
activity throughout both lungs, with no evidence of significant
xenon retention in either lung during washout phase.

The perfusion images show heterogeneous distribution of
radiopharmaceutical activity bilaterally.  Moderate perfusion
defect is seen in the central left upper lobe, which is smaller
than corresponding radiograph opacity.

Two large unmatched segmental perfusion defects are seen in the
right middle lobe and the superior segment of the right lower lobe,
which show no mentioned radiographic opacity.  These findings are
consistent with high probability for pulmonary embolism.
IMPRESSION: High probability for pulmonary embolism.

## 2011-11-19 ENCOUNTER — Other Ambulatory Visit: Payer: Self-pay | Admitting: Family Medicine

## 2011-11-22 NOTE — Telephone Encounter (Signed)
Patient notified

## 2011-11-29 ENCOUNTER — Other Ambulatory Visit: Payer: Self-pay | Admitting: Family Medicine

## 2011-11-29 ENCOUNTER — Encounter: Payer: Self-pay | Admitting: Sports Medicine

## 2011-11-29 ENCOUNTER — Ambulatory Visit (INDEPENDENT_AMBULATORY_CARE_PROVIDER_SITE_OTHER): Payer: Medicaid Other | Admitting: Sports Medicine

## 2011-11-29 VITALS — BP 140/95 | HR 68 | Temp 97.4°F | Ht 69.0 in | Wt 376.5 lb

## 2011-11-29 DIAGNOSIS — I878 Other specified disorders of veins: Secondary | ICD-10-CM

## 2011-11-29 DIAGNOSIS — E669 Obesity, unspecified: Secondary | ICD-10-CM

## 2011-11-29 DIAGNOSIS — Z23 Encounter for immunization: Secondary | ICD-10-CM

## 2011-11-29 DIAGNOSIS — Z0000 Encounter for general adult medical examination without abnormal findings: Secondary | ICD-10-CM

## 2011-11-29 DIAGNOSIS — I872 Venous insufficiency (chronic) (peripheral): Secondary | ICD-10-CM

## 2011-11-29 DIAGNOSIS — I2699 Other pulmonary embolism without acute cor pulmonale: Secondary | ICD-10-CM

## 2011-11-29 DIAGNOSIS — E039 Hypothyroidism, unspecified: Secondary | ICD-10-CM

## 2011-11-29 DIAGNOSIS — Z1211 Encounter for screening for malignant neoplasm of colon: Secondary | ICD-10-CM

## 2011-11-29 DIAGNOSIS — I10 Essential (primary) hypertension: Secondary | ICD-10-CM

## 2011-11-29 NOTE — Patient Instructions (Addendum)
It was nice to meet you today.  I will give you a prescription for compression stockings to help with your swelling.  Here are some basic nutrition rules to remember:  "[place]" - if you think it was made in a factory . . it is likely best to avoid it as a staple in your diet.  Limiting these types of foods to 1-2 times per week is a good idea.  Sticking with fresh fruits and vegetables as well as home cooked meals will typically provide more nutrition and less salt than prepackaged meals.     Limit the amount of sugar sweetened and artificially sweetened foods and beverages.  Sticking with water flavored with a slice of lemon, lime or orange is a great option if you want something with flavor in it.  Using flavored seltzer water to flavor plain water will also add some bite if you want something more than flavor.     Here are 2 of my favorite web sites that provide great nutrition and exercise advice.   www.eats[place]movemore[place].com www.BuildDNA.es  Follow up with Horris Latino in the lab sometime this week Follow up with me in 3 weeks

## 2011-12-02 ENCOUNTER — Other Ambulatory Visit: Payer: Medicaid Other

## 2011-12-02 ENCOUNTER — Encounter: Payer: Self-pay | Admitting: Sports Medicine

## 2011-12-02 DIAGNOSIS — Z0000 Encounter for general adult medical examination without abnormal findings: Secondary | ICD-10-CM | POA: Insufficient documentation

## 2011-12-02 DIAGNOSIS — I878 Other specified disorders of veins: Secondary | ICD-10-CM | POA: Insufficient documentation

## 2011-12-02 DIAGNOSIS — E669 Obesity, unspecified: Secondary | ICD-10-CM

## 2011-12-02 DIAGNOSIS — I10 Essential (primary) hypertension: Secondary | ICD-10-CM

## 2011-12-02 LAB — LIPID PANEL
HDL: 37 mg/dL — ABNORMAL LOW (ref >39)
Triglycerides: 96 mg/dL (ref <150)

## 2011-12-02 LAB — TSH: TSH: 4.07 u[IU]/mL (ref 0.350–4.500)

## 2011-12-02 LAB — BASIC METABOLIC PANEL: CO2: 25 mEq/L (ref 19–32)

## 2011-12-02 MED ORDER — INDAPAMIDE 2.5 MG PO TABS: 2.5000 mg | ORAL_TABLET | Freq: Every day | ORAL | Status: DC

## 2011-12-02 MED ORDER — CARVEDILOL 25 MG PO TABS: 25.0000 mg | ORAL_TABLET | Freq: Two times a day (BID) | ORAL | Status: DC

## 2011-12-02 MED ORDER — AMLODIPINE BESYLATE 10 MG PO TABS: 10.0000 mg | ORAL_TABLET | Freq: Every day | ORAL | Status: DC

## 2011-12-02 MED ORDER — CLONIDINE HCL 0.1 MG PO TABS: 0.1000 mg | ORAL_TABLET | Freq: Two times a day (BID) | ORAL | Status: DC

## 2011-12-02 NOTE — Assessment & Plan Note (Signed)
TSH to be checked at next visit

## 2011-12-02 NOTE — Assessment & Plan Note (Signed)
Patient provided prescription for compression stockings.  Distal pulses are intact.

## 2011-12-02 NOTE — Progress Notes (Signed)
  [facility]  [name] MRN [id number]  Date of birth: [birth year]  CC & HPI:  Levar Knierim is a 51 y.o. male presenting today for  followup of his multiple medical problems including super morbid obesity, hypertension, CKD stage III.  # HYPERTENSION: taking medications as instructed, no medication side effects noted, no TIA's, no chest pain on exertion, no dyspnea on exertion and no swelling of ankles.     # Super morbid obesity: Patient has been trying to lose weight has decreased his fat greasy foods.  Realizes that his weight is a problem and does wish to lose significant amounts.  Keflex weight of approximately 60 pounds over the past couple of years.   # CKD STAGE III-is not followed by nephrology at this point.  Understands the his obesity and hypertension are likely contributing to profound renal disease and ultimately lead him to dialysis.  # Lower extremity swelling: He has noted that his legs up and swelling more of the past 6 months.  They're worse at night.  They result by morning he is not tried anything for this     ROS:  Per history of present illness  Pertinent History Reviewed:  Medical & Surgical Hx:  Reviewed: Significant for obesity, kidney disease, left ventricular hypertrophy Medications: Reviewed & Updated - see associated section Social History: Reviewed - Significant for nonsmoker  Objective Findings:  Vitals:  Filed Vitals:   11/29/11 1433  BP: 140/95  Pulse: 68  Temp: 97.4 F (36.3 C)    PE: GENERAL:  Super morbidly Obese African American male. In no discomfort; no respiratory distress. PSYCH: Alert and appropriately interactive; Insight:Good   H&N: AT/[place], trachea midline EENT:  MMM, no scleral icterus, EOMi HEART: RRR, S1/S2 heard, no murmur LUNGS: CTA B, no wheezes, no crackles EXTREMITIES: Moves all 4 extremities spontaneously, warm well perfused, 3/4 pretibial pitting edema, bilateral DP and PT pulses 2/4.       Assessment & Plan:

## 2011-12-02 NOTE — Assessment & Plan Note (Signed)
Will check BMET with fasting labs Consider referal to

## 2011-12-02 NOTE — Assessment & Plan Note (Signed)
Elevated today.  Pt not wanting to increase medications and interested in therapeutic lifestyle modifications.  Pt reports he has decreased his fast and greasy food recently but can do a better job. Encouraged to follow the DASH diet principles and provided a sheet with further information

## 2011-12-02 NOTE — Assessment & Plan Note (Signed)
Referral for screening colonoscopy

## 2011-12-02 NOTE — Progress Notes (Signed)
BMP,FLP,TSH DONE TODAY Janelli Welling

## 2011-12-02 NOTE — Assessment & Plan Note (Signed)
Discussed lifestyle modifications.  Pt encouraged to increase fresh vegetable consumption.  Feels that he maybe able to increase his fruit consumption.  Hesitant to encourage excessive fruit but will encourage fresh food choices.

## 2011-12-07 ENCOUNTER — Encounter: Payer: Self-pay | Admitting: Gastroenterology

## 2011-12-20 ENCOUNTER — Encounter: Payer: Self-pay | Admitting: Sports Medicine

## 2011-12-20 ENCOUNTER — Ambulatory Visit (INDEPENDENT_AMBULATORY_CARE_PROVIDER_SITE_OTHER): Payer: Medicaid Other | Admitting: Sports Medicine

## 2011-12-20 VITALS — BP 122/78 | HR 67 | Temp 97.8°F | Ht 69.0 in | Wt 377.8 lb

## 2011-12-20 DIAGNOSIS — I10 Essential (primary) hypertension: Secondary | ICD-10-CM

## 2011-12-20 DIAGNOSIS — I878 Other specified disorders of veins: Secondary | ICD-10-CM

## 2011-12-20 DIAGNOSIS — E669 Obesity, unspecified: Secondary | ICD-10-CM

## 2011-12-20 DIAGNOSIS — E039 Hypothyroidism, unspecified: Secondary | ICD-10-CM

## 2011-12-20 DIAGNOSIS — I872 Venous insufficiency (chronic) (peripheral): Secondary | ICD-10-CM

## 2011-12-20 DIAGNOSIS — Z0000 Encounter for general adult medical examination without abnormal findings: Secondary | ICD-10-CM

## 2011-12-20 NOTE — Patient Instructions (Addendum)
Google the DASH DIET.    Here are some basic nutrition rules to remember:  "[place]" - if you think it was made in a factory . . it is likely best to avoid it as a staple in your diet.  Limiting these types of foods to 1-2 times per week is a good idea.  Sticking with fresh fruits and vegetables as well as home cooked meals will typically provide more nutrition and less salt than prepackaged meals.     Limit the amount of sugar sweetened and artificially sweetened foods and beverages.  Sticking with water flavored with a slice of lemon, lime or orange is a great option if you want something with flavor in it.  Using flavored seltzer water to flavor plain water will also add some bite if you want something more than flavor.     Here are 2 of my favorite web sites that provide great nutrition and exercise advice.   www.eatsmartmovemore[place].com www.BuildDNA.es  Follow up in 1-3 months and we can discuss referring you for a sleep study. Keep up the good work and focus on the lifestyle changes at this time!

## 2011-12-22 NOTE — Assessment & Plan Note (Addendum)
Continuing to make therapeutic lifestyle changes.  Encouraging lifestyle modification at this point.  Discussed portion sizes and continued to incorporate threshold foods. Given information on Dash diet > Evaluate for sleep apnea given events night and history of snoring.  With difficulty losing weight and

## 2011-12-22 NOTE — Progress Notes (Signed)
  [facility]  [name] MRN [id number]  Date of birth: 1960/09/07  CC & HPI:  Nathias Zuckerman is a 51 y.o. male presenting today for followup of hypertension, obesity.  # HYPERTENSION: taking medications as instructed, no medication side effects noted, no TIA's, no chest pain on exertion, no dyspnea on exertion and no swelling of ankles.   # Obesity: Focusing on eating whole foods.  Has incorporated more vegetables into his diet.  Has had 1 pound weight gain however is working on lifestyle modification.  Portion control appears to be this problem.  Increases in water intake.  Does report snoring at night.  With occasional apparent apneic events.  # Lower extremity edema: Using compression stockings.  Reports significant increase however still profound swelling.    # Follow up of labs from last visit:   ROS:  Per history of present illness  Pertinent History Reviewed:  Medical & Surgical Hx:  Reviewed: Significant for pulmonary embolism x1 Medications: Reviewed & Updated - see associated section Social History: Reviewed -  reports that he has quit smoking. He does not have any smokeless tobacco history on file.  Objective Findings:  Vitals:  Filed Vitals:   12/20/11 1418  BP: 141/84  Pulse: 67  Temp: 97.8 F (36.6 C)    PE: GENERAL:  Adult morbidly obese African American male. In no discomfort; no respiratory distress. PSYCH: Alert and appropriately interactive; Insight:Good   H&N: AT/[place], trachea midline EENT:  MMM, no scleral icterus, EOMi HEART: RRR, S1/S2 heard, no murmur LUNGS: CTA B, no wheezes, no crackles EXTREMITIES: Moves all 4 extremities spontaneously, warm well perfused.  Left lower extremity, status post trauma with postsurgical changes.  No evidence of infection.  Patient did not wears compression stockings today and he has 2+ swelling   with no evidence of chronic venous stasis ulceration however there is chronic stasis  changes however improved from last visit.     EPWORTH Sleepiness Scale Read TV Public Passenger Lying Down Talking Lunch In Car, In traffic Total:  1 1 0 1 2 0 2 0 7        Assessment & Plan:

## 2011-12-22 NOTE — Assessment & Plan Note (Signed)
Improved with compression stockings.  encouraged to continue daily

## 2011-12-22 NOTE — Assessment & Plan Note (Signed)
Appointment with GI for colonoscopy in November

## 2011-12-22 NOTE — Assessment & Plan Note (Addendum)
Recheck at 122/78.   Stable/Well Controlled - no changes at this time. > Evaluate for sleep apnea

## 2011-12-22 NOTE — Assessment & Plan Note (Signed)
TSH slightly elevated however within normal limits.  continue to monitor  >recheck in 3 months if any higher would consider starting Synthroid at that time

## 2011-12-22 NOTE — Assessment & Plan Note (Signed)
Creatinine improved at last check > Recheck 3 months

## 2011-12-28 ENCOUNTER — Encounter: Payer: Self-pay | Admitting: Gastroenterology

## 2012-01-06 ENCOUNTER — Ambulatory Visit (INDEPENDENT_AMBULATORY_CARE_PROVIDER_SITE_OTHER): Payer: Medicaid Other | Admitting: Gastroenterology

## 2012-01-06 ENCOUNTER — Encounter: Payer: Self-pay | Admitting: Gastroenterology

## 2012-01-06 VITALS — BP 126/84 | HR 80 | Ht 67.75 in | Wt 382.0 lb

## 2012-01-06 DIAGNOSIS — E669 Obesity, unspecified: Secondary | ICD-10-CM

## 2012-01-06 DIAGNOSIS — Z1211 Encounter for screening for malignant neoplasm of colon: Secondary | ICD-10-CM

## 2012-01-06 MED ORDER — NA SULFATE-K SULFATE-MG SULF 17.5-3.13-1.6 GM/177ML PO SOLN: 1.0000 | Freq: Once | ORAL | Status: DC

## 2012-01-06 NOTE — Patient Instructions (Addendum)
You have been scheduled for your procedure Separate instructions have been given  Colonoscopy A colonoscopy is an exam to evaluate your entire colon. In this exam, your colon is cleansed. A long fiberoptic tube is inserted through your rectum and into your colon. The fiberoptic scope (endoscope) is a long bundle of enclosed and very flexible fibers. These fibers transmit light to the area examined and send images from that area to your caregiver. Discomfort is usually minimal. You may be given a drug to help you sleep (sedative) during or prior to the procedure. This exam helps to detect lumps (tumors), polyps, inflammation, and areas of bleeding. Your caregiver may also take a small piece of tissue (biopsy) that will be examined under a microscope. LET YOUR CAREGIVER KNOW ABOUT:   Allergies to food or medicine.  Medicines taken, including vitamins, herbs, eyedrops, over-the-counter medicines, and creams.  Use of steroids (by mouth or creams).  Previous problems with anesthetics or numbing medicines.  History of bleeding problems or blood clots.  Previous surgery.  Other health problems, including diabetes and kidney problems.  Possibility of pregnancy, if this applies. BEFORE THE PROCEDURE   A clear liquid diet may be required for 2 days before the exam.  Ask your caregiver about changing or stopping your regular medications.  Liquid injections (enemas) or laxatives may be required.  A large amount of electrolyte solution may be given to you to drink over a short period of time. This solution is used to clean out your colon.  You should be present 60 minutes prior to your procedure or as directed by your caregiver. AFTER THE PROCEDURE   If you received a sedative or pain relieving medication, you will need to arrange for someone to drive you home.  Occasionally, there is a little blood passed with the first bowel movement. Do not be concerned. FINDING OUT THE RESULTS OF YOUR  TEST Not all test results are available during your visit. If your test results are not back during the visit, make an appointment with your caregiver to find out the results. Do not assume everything is normal if you have not heard from your caregiver or the medical facility. It is important for you to follow up on all of your test results. HOME CARE INSTRUCTIONS   It is not unusual to pass moderate amounts of gas and experience mild abdominal cramping following the procedure. This is due to air being used to inflate your colon during the exam. Walking or a warm pack on your belly (abdomen) may help.  You may resume all normal meals and activities after sedatives and medicines have worn off.  Only take over-the-counter or prescription medicines for pain, discomfort, or fever as directed by your caregiver. Do not use aspirin or blood thinners if a biopsy was taken. Consult your caregiver for medicine usage if biopsies were taken. SEEK IMMEDIATE MEDICAL CARE IF:   You have a fever.  You pass large blood clots or fill a toilet with blood following the procedure. This may also occur 10 to 14 days following the procedure. This is more likely if a biopsy was taken.  You develop abdominal pain that keeps getting worse and cannot be relieved with medicine. Document Released: 02/06/2000 Document Revised: 05/03/2011 Document Reviewed: 09/21/2007 [facility] Patient Information 2013 [place].

## 2012-01-06 NOTE — Assessment & Plan Note (Signed)
We spoke about the patient's obesity. I offered dietary counseling. The patient wishes to try dieting on his own.

## 2012-01-06 NOTE — Progress Notes (Signed)
History of Present Illness: This 51 year old Afro-American male referred at the request of Dr. Paulla Fore for screening colonoscopy. Family history is pertinent for his father who developed colon cancer in his 54s. [name] is no GI complaints including change of bowel habits, abdominal pain, melena or hematochezia.  The patient has a history of a pulmonary embolus but is now off anticoagulants.    Past Medical History  Diagnosis Date  . 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 No pertinent past surgical history. family history includes Breast cancer in his mother; Colon cancer in his father; and Hypertension in his brother and mother. Current Outpatient Prescriptions  Medication Sig Dispense Refill  . amLODipine (NORVASC) 10 MG tablet Take 1 tablet (10 mg total) by mouth daily.  90 tablet  3  . aspirin 81 MG chewable tablet Chew 81 mg by mouth daily.        . carvedilol (COREG) 25 MG tablet Take 1 tablet (25 mg total) by mouth 2 (two) times daily.  60 tablet  11  . cloNIDine (CATAPRES) 0.1 MG tablet Take 1 tablet (0.1 mg total) by mouth 2 (two) times daily.  60 tablet  10  . indapamide (LOZOL) 2.5 MG tablet Take 1 tablet (2.5 mg total) by mouth daily.  30 tablet  11   Allergies as of 01/06/2012  . (No Known Allergies)    reports that he has never smoked. He has never used smokeless tobacco. He reports that he drinks alcohol. He reports that he does not use illicit drugs.     Review of Systems: Pertinent positive and negative review of systems were noted in the above HPI section. All other review of systems were otherwise negative.  Vital signs were reviewed in today's medical record Physical Exam: General: He is an obese male no acute distress Head: Normocephalic and atraumatic Eyes:  sclerae anicteric, EOMI Ears: Normal auditory acuity Mouth: No deformity or lesions Neck: Supple, no masses or thyromegaly Lungs: Clear throughout to auscultation Heart: Regular rate and  rhythm; no murmurs, rubs or bruits Abdomen: Soft, non tender and non distended. No masses, hepatosplenomegaly or hernias noted. Normal Bowel sounds Rectal:deferred Musculoskeletal: Symmetrical with no gross deformities  Skin: No lesions on visible extremities Pulses:  Normal pulses noted Extremities: No clubbing, cyanosis,or deformities noted. There is trace ankle edema Neurological: Alert oriented x 4, grossly nonfocal Cervical Nodes:  No significant cervical adenopathy Inguinal Nodes: No significant inguinal adenopathy Psychological:  Alert and cooperative. Normal mood and affect

## 2012-01-06 NOTE — Assessment & Plan Note (Addendum)
Although the patient's father had colon cancer in his 30s, this really does not constitute a significant family history of colorectal cancer.  Patient will undergo colonoscopy as an average risk patient. Because of his obesity he will be evaluated by anesthesia and sedated as per their recommendations.

## 2012-01-13 ENCOUNTER — Encounter: Payer: Medicaid Other | Admitting: Gastroenterology

## 2012-01-17 ENCOUNTER — Telehealth: Payer: Self-pay | Admitting: Gastroenterology

## 2012-01-17 NOTE — Telephone Encounter (Signed)
L/M for pt to come in and get a free sample prep kit. May be Suprep

## 2012-01-25 ENCOUNTER — Encounter (HOSPITAL_BASED_OUTPATIENT_CLINIC_OR_DEPARTMENT_OTHER): Payer: Medicaid Other

## 2012-01-26 ENCOUNTER — Encounter (HOSPITAL_COMMUNITY): Payer: Self-pay | Admitting: Pharmacy Technician

## 2012-01-27 ENCOUNTER — Encounter (HOSPITAL_COMMUNITY): Payer: Self-pay | Admitting: *Deleted

## 2012-01-27 NOTE — Pre-Procedure Instructions (Addendum)
Your procedure is scheduled on: Monday, January 31, 2012 Report to [place] at:1100 Call this number if you have problems morning of your [phone]  Follow all bowel prep instructions per your doctor's orders.  Do not eat or drink anything after midnight the night before your procedure. You may brush your teeth, rinse out your mouth, but no water, no food, no chewing gum, no mints, no candies, no chewing tobacco.     Take these medicines the morning of your procedure with A SIP OF WATER:Coreg,Catapress,Indapamide and Norvasc  Will not take Aspirin   Please make arrangements for a responsible person to drive you home after the procedure. You cannot go home by cab/taxi. We recommend you have someone with you at home the first 24 hours after your procedure. Driver for procedure is sister Mateo Flow Having a sleep study test 02-13-2012  LEAVE ALL VALUABLES, JEWELRY, BILLFOLD AT HOME.  NO DENTURES, CONTACT LENSES ALLOWED IN THE ENDOSCOPY ROOM.   YOU MAY WEAR DEODORANT, PLEASE REMOVE ALL JEWELRY, WATCHES RINGS, BODY PIERCINGS AND LEAVE AT HOME.   WOMEN: NO MAKE-UP, LOTIONS PERFUMES

## 2012-01-31 ENCOUNTER — Encounter (HOSPITAL_COMMUNITY)
Admission: RE | Disposition: A | Discharge status: Home / Self Care | Payer: Self-pay | Source: Ambulatory Visit | Attending: Gastroenterology

## 2012-01-31 ENCOUNTER — Encounter (HOSPITAL_COMMUNITY): Payer: Self-pay | Admitting: Gastroenterology

## 2012-01-31 ENCOUNTER — Ambulatory Visit (HOSPITAL_COMMUNITY): Payer: Medicaid Other | Admitting: Anesthesiology

## 2012-01-31 ENCOUNTER — Encounter (HOSPITAL_COMMUNITY): Payer: Self-pay | Admitting: Anesthesiology

## 2012-01-31 ENCOUNTER — Ambulatory Visit (HOSPITAL_COMMUNITY)
Admission: RE | Admit: 2012-01-31 | Discharge: 2012-01-31 | Disposition: A | Discharge status: Home / Self Care | Payer: Medicaid Other | Source: Ambulatory Visit | Attending: Gastroenterology | Admitting: Gastroenterology

## 2012-01-31 DIAGNOSIS — Z800 Family history of malignant neoplasm of digestive organs: Secondary | ICD-10-CM | POA: Insufficient documentation

## 2012-01-31 DIAGNOSIS — K5730 Diverticulosis of large intestine without perforation or abscess without bleeding: Secondary | ICD-10-CM | POA: Insufficient documentation

## 2012-01-31 DIAGNOSIS — Z1211 Encounter for screening for malignant neoplasm of colon: Secondary | ICD-10-CM | POA: Insufficient documentation

## 2012-01-31 DIAGNOSIS — Z86711 Personal history of pulmonary embolism: Secondary | ICD-10-CM | POA: Insufficient documentation

## 2012-01-31 DIAGNOSIS — Z79899 Other long term (current) drug therapy: Secondary | ICD-10-CM | POA: Insufficient documentation

## 2012-01-31 DIAGNOSIS — Z7982 Long term (current) use of aspirin: Secondary | ICD-10-CM | POA: Insufficient documentation

## 2012-01-31 HISTORY — PX: COLONOSCOPY: SHX5424

## 2012-01-31 HISTORY — DX: Essential (primary) hypertension: I10

## 2012-01-31 HISTORY — DX: Diverticulosis of large intestine without perforation or abscess without bleeding: K57.30

## 2012-01-31 LAB — GLUCOSE, CAPILLARY: Glucose-Capillary: 82 mg/dL (ref 70–99)

## 2012-01-31 SURGERY — COLONOSCOPY
Anesthesia: Monitor Anesthesia Care

## 2012-01-31 MED ORDER — LACTATED RINGERS IV SOLN
INTRAVENOUS | Status: DC
  Administered 2012-01-31: 1000 mL via INTRAVENOUS

## 2012-01-31 MED ORDER — PROPOFOL 10 MG/ML IV BOLUS
INTRAVENOUS | Status: DC | PRN
  Administered 2012-01-31 (×3): 50 mg via INTRAVENOUS

## 2012-01-31 MED ORDER — PROPOFOL 10 MG/ML IV EMUL
INTRAVENOUS | Status: DC | PRN
  Administered 2012-01-31: 75 ug/kg/min via INTRAVENOUS

## 2012-01-31 MED ORDER — FENTANYL CITRATE 0.05 MG/ML IJ SOLN
INTRAMUSCULAR | Status: DC | PRN
  Administered 2012-01-31: 25 ug via INTRAVENOUS

## 2012-01-31 NOTE — Anesthesia Preprocedure Evaluation (Addendum)
Anesthesia Evaluation  Patient identified by MRN, date of birth, ID band Patient awake    Reviewed: Allergy & Precautions, H&P , NPO status , Patient's Chart, lab work & pertinent test results, reviewed documented beta blocker date and time   Airway Mallampati: II TM Distance: >3 FB Neck ROM: full    Dental  (+) Dental Advisory Given and Edentulous Upper   Pulmonary neg pulmonary ROS,  History PE breath sounds clear to auscultation  Pulmonary exam normal       Cardiovascular Exercise Tolerance: Good hypertension, On Medications and On Home Beta Blockers Rhythm:regular Rate:Normal     Neuro/Psych negative neurological ROS  negative psych ROS   GI/Hepatic negative GI ROS, Neg liver ROS,   Endo/Other  negative endocrine ROSHypothyroidism   Renal/GU Renal diseasenegative Renal ROSCr 1.88  negative genitourinary   Musculoskeletal   Abdominal   Peds  Hematology negative hematology ROS (+)   Anesthesia Other Findings   Reproductive/Obstetrics negative OB ROS                          Anesthesia Physical Anesthesia Plan  ASA: III  Anesthesia Plan: MAC   Post-op Pain Management:    Induction:   Airway Management Planned:   Additional Equipment:   Intra-op Plan:   Post-operative Plan:   Informed Consent: I have reviewed the patients History and Physical, chart, labs and discussed the procedure including the risks, benefits and alternatives for the proposed anesthesia with the patient or authorized representative who has indicated his/her understanding and acceptance.   Dental Advisory Given  Plan Discussed with: CRNA and Surgeon  Anesthesia Plan Comments:        Anesthesia Quick Evaluation

## 2012-01-31 NOTE — Anesthesia Postprocedure Evaluation (Signed)
  Anesthesia Post-op Note  Patient: Johnny [name]  Procedure(s) Performed: Procedure(s) (LRB): COLONOSCOPY (N/A)  Patient Location: PACU  Anesthesia Type: MAC  Level of Consciousness: awake and alert   Airway and Oxygen Therapy: Patient Spontanous Breathing  Post-op Pain: mild  Post-op Assessment: Post-op Vital signs reviewed, Patient's Cardiovascular Status Stable, Respiratory Function Stable, Patent Airway and No signs of Nausea or vomiting  Last Vitals:  Filed Vitals:   01/31/12 1247  BP: 122/77  Pulse:   Temp: 36.4 C  Resp: 23    Post-op Vital Signs: stable   Complications: No apparent anesthesia complications

## 2012-01-31 NOTE — Interval H&P Note (Signed)
History and Physical Interval Note:  01/31/2012 12:09 PM  Johnny [name]  has presented today for surgery, with the diagnosis of screening colon  The various methods of treatment have been discussed with the patient and family. After consideration of risks, benefits and other options for treatment, the patient has consented to  Procedure(s) (LRB) with comments: COLONOSCOPY (N/A) - bmi is 58 as a surgical intervention .  The patient's history has been reviewed, patient examined, no change in status, stable for surgery.  I have reviewed the patient's chart and labs.  Questions were answered to the patient's satisfaction.    The recent H&P (dated *01/06/12**) was reviewed, the patient was examined and there is no change in the patients condition since that H&P was completed.   Erskine Emery  01/31/2012, 12:09 PM    Erskine Emery

## 2012-01-31 NOTE — Transfer of Care (Signed)
Immediate Anesthesia Transfer of Care Note  Patient: Johnny [name]  Procedure(s) Performed: Procedure(s) (LRB): COLONOSCOPY (N/A)  Patient Location: PACU  Anesthesia Type: MAC  Level of Consciousness: sedated, patient cooperative and responds to stimulaton  Airway & Oxygen Therapy: Patient Spontanous Breathing and Patient connected to face mask oxgen  Post-op Assessment: Report given to PACU RN and Post -op Vital signs reviewed and stable  Post vital signs: Reviewed and stable  Complications: No apparent anesthesia complications

## 2012-01-31 NOTE — H&P (View-Only) (Signed)
History of Present Illness: This 51 year old Afro-American male referred at the request of Dr. Paulla Fore for screening colonoscopy. Family history is pertinent for his father who developed colon cancer in his 38s. Johnny [name] is no GI complaints including change of bowel habits, abdominal pain, melena or hematochezia.  The patient has a history of a pulmonary embolus but is now off anticoagulants.    Past Medical History  Diagnosis Date  . 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 No pertinent past surgical history. family history includes Breast cancer in his mother; Colon cancer in his father; and Hypertension in his brother and mother. Current Outpatient Prescriptions  Medication Sig Dispense Refill  . amLODipine (NORVASC) 10 MG tablet Take 1 tablet (10 mg total) by mouth daily.  90 tablet  3  . aspirin 81 MG chewable tablet Chew 81 mg by mouth daily.        . carvedilol (COREG) 25 MG tablet Take 1 tablet (25 mg total) by mouth 2 (two) times daily.  60 tablet  11  . cloNIDine (CATAPRES) 0.1 MG tablet Take 1 tablet (0.1 mg total) by mouth 2 (two) times daily.  60 tablet  10  . indapamide (LOZOL) 2.5 MG tablet Take 1 tablet (2.5 mg total) by mouth daily.  30 tablet  11   Allergies as of 01/06/2012  . (No Known Allergies)    reports that he has never smoked. He has never used smokeless tobacco. He reports that he drinks alcohol. He reports that he does not use illicit drugs.     Review of Systems: Pertinent positive and negative review of systems were noted in the above HPI section. All other review of systems were otherwise negative.  Vital signs were reviewed in today's medical record Physical Exam: General: He is an obese male no acute distress Head: Normocephalic and atraumatic Eyes:  sclerae anicteric, EOMI Ears: Normal auditory acuity Mouth: No deformity or lesions Neck: Supple, no masses or thyromegaly Lungs: Clear throughout to auscultation Heart: Regular rate and  rhythm; no murmurs, rubs or bruits Abdomen: Soft, non tender and non distended. No masses, hepatosplenomegaly or hernias noted. Normal Bowel sounds Rectal:deferred Musculoskeletal: Symmetrical with no gross deformities  Skin: No lesions on visible extremities Pulses:  Normal pulses noted Extremities: No clubbing, cyanosis,or deformities noted. There is trace ankle edema Neurological: Alert oriented x 4, grossly nonfocal Cervical Nodes:  No significant cervical adenopathy Inguinal Nodes: No significant inguinal adenopathy Psychological:  Alert and cooperative. Normal mood and affect

## 2012-01-31 NOTE — Op Note (Signed)
[facility] [place], [address]   COLONOSCOPY PROCEDURE REPORT  PATIENT: Johnny [name], Johnny [name]  MR#: [id number] BIRTHDATE: 05/06/1960 , 51  yrs. old GENDER: Male ENDOSCOPIST: Inda Castle, MD REFERRED [id number] Andria Frames, M.D. PROCEDURE DATE:  01/31/2012 PROCEDURE:   Colonoscopy, diagnostic ASA CLASS:   Class III INDICATIONS: MEDICATIONS: MAC sedation, administered by CRNA  DESCRIPTION OF PROCEDURE:   After the risks benefits and alternatives of the procedure were thoroughly explained, informed consent was obtained.  A digital rectal exam revealed no abnormalities of the rectum.   The Pentax Colonoscope [id number] endoscope was introduced through the anus and advanced to the cecum, which was identified by both the appendix and ileocecal valve. No adverse events experienced.   The quality of the prep was Suprep excellent  The instrument was then slowly withdrawn as the colon was fully examined.      COLON FINDINGS: Mild diverticulosis was noted in the descending colon, transverse colon, and ascending colon.   The colon mucosa was otherwise normal.  Retroflexed views revealed no abnormalities. The time to cecum=  .  Withdrawal time=8 minutes 0 seconds.  The scope was withdrawn and the procedure completed. COMPLICATIONS: There were no complications.  ENDOSCOPIC IMPRESSION: 1.   Mild diverticulosis was noted in the descending colon, transverse colon, and ascending colon 2.   The colon mucosa was otherwise normal  RECOMMENDATIONS: Continue current colorectal screening recommendations for "routine risk" patients with a repeat colonoscopy in 10 years.   eSigned:  Inda Castle, MD 01/31/2012 12:40 PM   cc:

## 2012-02-01 ENCOUNTER — Encounter (HOSPITAL_COMMUNITY): Payer: Self-pay | Admitting: Gastroenterology

## 2012-02-13 ENCOUNTER — Ambulatory Visit (HOSPITAL_BASED_OUTPATIENT_CLINIC_OR_DEPARTMENT_OTHER): Payer: Medicaid Other

## 2012-02-25 ENCOUNTER — Telehealth: Payer: Self-pay | Admitting: Sports Medicine

## 2012-02-25 NOTE — Telephone Encounter (Signed)
Tried to get his amlodipine refilled and they told him it would be $72 - he was getting it for $4 - not sure what happened but he is now out and needs to know what to do  [facility] - [address]

## 2012-02-25 NOTE — Telephone Encounter (Signed)
Pharmacy states they have a current RX for Norvasc for patient that is ready to pick up. However the last time he got it filled 3 months ago  they had a discount plan and it cost him $17.00. Now they no longer honor that discount plan and will cost $72.00 for # 90 days.  Called patient to tell him and he states he has medicaid. Has not given pharmacy his medicaid card.  Advised him to do this . Pharmacy has RX ready to pick up.

## 2012-04-02 ENCOUNTER — Encounter: Payer: Self-pay | Admitting: Sports Medicine

## 2012-12-15 ENCOUNTER — Other Ambulatory Visit: Payer: Self-pay | Admitting: Family Medicine

## 2012-12-15 MED ORDER — CLONIDINE HCL 0.1 MG PO TABS: 0.1000 mg | ORAL_TABLET | Freq: Two times a day (BID) | ORAL | Status: DC

## 2012-12-15 MED ORDER — CARVEDILOL 25 MG PO TABS: 25.0000 mg | ORAL_TABLET | Freq: Two times a day (BID) | ORAL | Status: DC

## 2013-01-07 ENCOUNTER — Other Ambulatory Visit: Payer: Self-pay | Admitting: Sports Medicine

## 2013-01-07 MED ORDER — INDAPAMIDE 2.5 MG PO TABS: 2.5000 mg | ORAL_TABLET | Freq: Every day | ORAL | Status: DC

## 2013-01-23 ENCOUNTER — Other Ambulatory Visit: Payer: Self-pay | Admitting: Sports Medicine

## 2013-01-26 ENCOUNTER — Other Ambulatory Visit: Payer: Self-pay | Admitting: Sports Medicine

## 2013-01-26 MED ORDER — AMLODIPINE BESYLATE 10 MG PO TABS: 10.0000 mg | ORAL_TABLET | Freq: Every morning | ORAL | Status: DC

## 2013-02-07 ENCOUNTER — Other Ambulatory Visit: Payer: Self-pay | Admitting: Sports Medicine

## 2013-02-14 ENCOUNTER — Other Ambulatory Visit: Payer: Self-pay | Admitting: Sports Medicine

## 2013-03-09 ENCOUNTER — Telehealth: Payer: Self-pay | Admitting: Sports Medicine

## 2013-03-09 NOTE — Telephone Encounter (Signed)
He must have an appointment for any further refills.  This has been relayed to his pharmacy X 3 months and he has not been seen in over 1 year.    Has already been given 30 day supplies with notice that appointment is needed for further refills.  No appointment scheduled at this time.    Please call and inform  Gerda Diss, DO [facility] Resident - PGY-3 03/09/2013 2:39 PM

## 2013-03-09 NOTE — Telephone Encounter (Signed)
Patient needs a refills on ALL meds. He states he is out of everything.

## 2013-03-09 NOTE — Telephone Encounter (Signed)
Spoke with patient and appointment made for Tuesday 1/20 a2 2:30pm with dr Paulla Fore

## 2013-03-13 ENCOUNTER — Encounter: Payer: Self-pay | Admitting: Sports Medicine

## 2013-03-13 ENCOUNTER — Ambulatory Visit (INDEPENDENT_AMBULATORY_CARE_PROVIDER_SITE_OTHER): Payer: Medicaid Other | Admitting: Sports Medicine

## 2013-03-13 VITALS — BP 108/78 | HR 100 | Temp 97.8°F | Ht 67.75 in | Wt 396.0 lb

## 2013-03-13 DIAGNOSIS — I878 Other specified disorders of veins: Secondary | ICD-10-CM

## 2013-03-13 DIAGNOSIS — E669 Obesity, unspecified: Secondary | ICD-10-CM

## 2013-03-13 DIAGNOSIS — I872 Venous insufficiency (chronic) (peripheral): Secondary | ICD-10-CM

## 2013-03-13 DIAGNOSIS — N183 Chronic kidney disease, stage 3 unspecified: Secondary | ICD-10-CM

## 2013-03-13 DIAGNOSIS — I10 Essential (primary) hypertension: Secondary | ICD-10-CM

## 2013-03-13 DIAGNOSIS — Z23 Encounter for immunization: Secondary | ICD-10-CM

## 2013-03-13 DIAGNOSIS — R5383 Other fatigue: Secondary | ICD-10-CM

## 2013-03-13 DIAGNOSIS — R5381 Other malaise: Secondary | ICD-10-CM

## 2013-03-13 DIAGNOSIS — E039 Hypothyroidism, unspecified: Secondary | ICD-10-CM

## 2013-03-13 LAB — CBC
HEMATOCRIT: 43.5 % (ref 39.0–52.0)
HEMOGLOBIN: 14.9 g/dL (ref 13.0–17.0)
MCH: 28.9 pg (ref 26.0–34.0)
MCHC: 34.3 g/dL (ref 30.0–36.0)
MCV: 84.3 fL (ref 78.0–100.0)
Platelets: 329 10*3/uL (ref 150–400)
RBC: 5.16 MIL/uL (ref 4.22–5.81)
RDW: 16.5 % — ABNORMAL HIGH (ref 11.5–15.5)
WBC: 6.3 10*3/uL (ref 4.0–10.5)

## 2013-03-13 MED ORDER — CARVEDILOL 12.5 MG PO TABS: 12.5000 mg | ORAL_TABLET | Freq: Two times a day (BID) | ORAL | Status: DC

## 2013-03-13 MED ORDER — INDAPAMIDE 2.5 MG PO TABS: 2.5000 mg | ORAL_TABLET | Freq: Every day | ORAL | Status: DC

## 2013-03-13 NOTE — Patient Instructions (Signed)
   Go get compression socks  Refill meds as below  We will set you up for a sleep study.  Keep exercising    If you need anything prior to your next visit please call the clinic. Please Bring all medications or accurate medication list with you to each appointment; an accurate medication list is essential in providing you the best care possible.

## 2013-03-13 NOTE — Progress Notes (Signed)
  Johnny [name] - 53 y.o. male MRN [id number]  Date of birth: 28-Mar-1960  CC, HPI, Interval History & ROS  Johnny [name] is here today to followup on his chronic medical conditions including:  Hypertension, LVH, CKD. stage III, super morbid obesity, chronic edema with venous stasis of the lower extremities, history of a provoked PE not on anticoagulation  Has been lost to followup.  He has been off of all medications for greater than one   P weekt denies chest pain, dyspnea at rest or exertion, PND, lower extremity edema.  Patient denies any facial asymmetry, unilateral weakness, or dysarthria.  Pt denies hypoglycemic symptoms/episodes.  No reported polyuria/polydipsia. Pt is compliant with foot exams and denies any new foot lesions or new sensory changes/dysesthesias.  He reports he is unsure if he snores.  He does have occasional daytime somnolence but has never fallen asleep at inappropriate times.  He is having significant difficulty with losing weight.  Interested in CPAP if found to have OSA/OHS  Pertinent History & Care Coordination  No Patient Care Coordination Note on file.  History  Smoking status  . Never Smoker   Smokeless tobacco  . Never Used   Health Maintenance Due  Topic  . Influenza Vaccine    No results found for this basename: HGBA1C, TRIG, CHOL, HDL, LDLCALC, TSH,  in the last 8760 hours   Otherwise past Medical, Surgical, Social, and Family History Reviewed per EMR Medications and Allergies reviewed and all updated if necessary. Objective Findings  VITALS: HR: 100 bpm  BP: 108/78 mmHg  TEMP: 97.8 F (36.6 C) (Oral)  RESP:    HT: 5' 7.75" (172.1 cm)  WT: 396 lb (179.624 kg)  BMI: 60.8   BP Readings from Last 3 Encounters:  03/13/13 108/78  01/31/12 122/77  01/31/12 122/77   Wt Readings from Last 3 Encounters:  03/13/13 396 lb (179.624 kg)  01/06/12 382 lb (173.274 kg)  12/20/11 377 lb 12.8 oz (171.369 kg)     PHYSICAL EXAM: GENERAL:  morbidly obese  African American male. In no discomfort; no respiratory distress  PSYCH: alert and appropriate, good insight   HNEENT:  short round neck, no JVD   CARDIO: RRR, S1/S2 heard, no murmur  LUNGS: CTA B, no wheezes, no crackles  ABDOMEN:   EXTREM:  Warm, well perfused.  Moves all 4 extremities spontaneously; no lateralization.  Significant venous stasis changes, with redundant adiposity  Distal pulses 1+/.  2+/4 pretibial edema.  GU:   SKIN:      Assessment & Plan   Problems addressed today: General Plan & Pt Instructions:  No diagnosis found.    Go get compression socks  Refill meds as below  We will set you up for a sleep study.  Keep exercising     For further discussion of A/P and for follow up issues see problem based charting.

## 2013-03-14 LAB — COMPREHENSIVE METABOLIC PANEL
ALBUMIN: 3.8 g/dL (ref 3.5–5.2)
ALT: 12 U/L (ref 0–53)
AST: 16 U/L (ref 0–37)
Alkaline Phosphatase: 68 U/L (ref 39–117)
BILIRUBIN TOTAL: 0.6 mg/dL (ref 0.3–1.2)
BUN: 15 mg/dL (ref 6–23)
CALCIUM: 9 mg/dL (ref 8.4–10.5)
CHLORIDE: 103 meq/L (ref 96–112)
CO2: 25 meq/L (ref 19–32)
Creat: 1.59 mg/dL — ABNORMAL HIGH (ref 0.50–1.35)
GLUCOSE: 97 mg/dL (ref 70–99)
POTASSIUM: 3.9 meq/L (ref 3.5–5.3)
SODIUM: 139 meq/L (ref 135–145)
TOTAL PROTEIN: 7.7 g/dL (ref 6.0–8.3)

## 2013-03-14 LAB — LIPID PANEL
CHOLESTEROL: 170 mg/dL (ref 0–200)
HDL: 49 mg/dL (ref >39)
LDL Cholesterol: 99 mg/dL (ref 0–99)
Total CHOL/HDL Ratio: 3.5 Ratio
Triglycerides: 112 mg/dL (ref <150)
VLDL: 22 mg/dL (ref 0–40)

## 2013-03-14 LAB — TSH: TSH: 4.122 u[IU]/mL (ref 0.350–4.500)

## 2013-03-17 NOTE — Assessment & Plan Note (Signed)
Recheck TSH with fasting labs

## 2013-03-17 NOTE — Assessment & Plan Note (Signed)
Rx for new compression socks provided

## 2013-03-17 NOTE — Assessment & Plan Note (Signed)
Recheck labs. Likely needs ACE inhibitor Continue diuretic cautiously

## 2013-03-17 NOTE — Assessment & Plan Note (Addendum)
Significant issue for this gentleman. He is motivated to change and is joining the [employer] encouraged aqua exercise Close followup  Given snoring component and morbid obesity Will evaluate with polysomnogram

## 2013-03-17 NOTE — Assessment & Plan Note (Signed)
Off all medicines for greater than a week and appropriately controlled Restart beta blocker for LVH. Hold clonidine Discontinue Amlodipine as likely contributing lower extremity swelling

## 2013-03-26 ENCOUNTER — Encounter: Payer: Self-pay | Admitting: Sports Medicine

## 2013-04-11 ENCOUNTER — Ambulatory Visit (INDEPENDENT_AMBULATORY_CARE_PROVIDER_SITE_OTHER): Payer: Medicaid Other | Admitting: Sports Medicine

## 2013-04-11 ENCOUNTER — Ambulatory Visit (HOSPITAL_BASED_OUTPATIENT_CLINIC_OR_DEPARTMENT_OTHER): Payer: Medicaid Other

## 2013-04-11 VITALS — BP 176/104 | HR 70 | Temp 97.6°F | Ht 67.75 in | Wt 380.7 lb

## 2013-04-11 DIAGNOSIS — E039 Hypothyroidism, unspecified: Secondary | ICD-10-CM

## 2013-04-11 DIAGNOSIS — N183 Chronic kidney disease, stage 3 unspecified: Secondary | ICD-10-CM

## 2013-04-11 DIAGNOSIS — E669 Obesity, unspecified: Secondary | ICD-10-CM

## 2013-04-11 DIAGNOSIS — I10 Essential (primary) hypertension: Secondary | ICD-10-CM

## 2013-04-11 MED ORDER — LISINOPRIL-HYDROCHLOROTHIAZIDE 10-12.5 MG PO TABS: 1.0000 | ORAL_TABLET | Freq: Every day | ORAL | Status: DC

## 2013-04-11 NOTE — Assessment & Plan Note (Signed)
ACE inhibitor for renal protective properties

## 2013-04-11 NOTE — Assessment & Plan Note (Signed)
Chronic, improving.  Discussed continued nutrition changes including consideration of a vegetarian diet as meat seems to be a trigger for his elevated blood pressures. Encouraged continued weight loss with TLC (Therapeutic Lifestyle Changes) > Follow up if joined [employer], aqua therapy recommended

## 2013-04-11 NOTE — Assessment & Plan Note (Signed)
Chronic, labile, Once again elevated today.  Has been compliant with all medications. Scheduled for sleep study tonight as I have concerns this is playing a large role in the lability of his pressures. Change in therapy to lisinopril HCTZ for renal protection. > Follow up BMET in 2 weeks for renal function

## 2013-04-11 NOTE — Assessment & Plan Note (Signed)
Marginally elevated TSH on last check. Recheck thyroid labs with next blood draw

## 2013-04-11 NOTE — Progress Notes (Signed)
Johnny [name] - 53 y.o. male MRN [id number]  Date of birth: 28-Sep-1960  Chief Complaint  Patient presents with  . medication follow up   Problems/Dx addressed during visit General Plan and Pt Instructions  1. HYPERTENSION, BENIGN SYSTEMIC   2. CHRONIC KIDNEY DISEASE STAGE III (MODERATE)   3. Obesity   4. Unspecified hypothyroidism       Start Linsinopril/HCTZ daily.  Stop your Indapamide  Sleep study tonight  Keep working on your dietary changes and return to the [facility] to start a regular exercise routine.  Go get your compression stockings  Return in 2 weeks for a recheck of your kidney function.  Consider starting a vegetarian based diet (heavy vegetable intake , low salt, few animal products     HPI, INTERVAL HISTORY & ROS   He reports overall doing well. continuing with TLC (Therapeutic Lifestyle Changes).  Pt denies chest pain, dyspnea at rest or exertion, PND, stable lower extremity edema.  Patient denies any facial asymmetry, unilateral weakness, or dysarthria.  Has been taking Coreg, aspirin and indapamide.  No medication side effects.  Mainly focusing on nutrition changes.  Reports eating a large quantity of barbecued pork 2 days ago and thinks this is the reason his blood pressure is up.  Pertinent History & Care Coordination  No Patient Care Coordination Note on file.  History  Smoking status  . Never Smoker   Smokeless tobacco  . Never Used  No health maintenance topics applied.  Recent Labs  03/13/13 1507  TRIG 112  CHOL 170  HDL 49  LDLCALC 99  TSH 4.122       Otherwise past Medical, Surgical, Social, and Family History Reviewed per EMR Medications and Allergies reviewed and all updated if necessary. Objective Findings  VITALS: HR: 70 bpm  BP: 176/104 mmHg  TEMP: 97.6 F (36.4 C) (Oral)  RESP:    HT: 5' 7.75" (172.1 cm)  WT: 380 lb 11.2 oz (172.684 kg)  BMI: 58.4   BP Readings from Last 3 Encounters:  04/11/13 176/104  03/13/13  108/78  01/31/12 122/77   Wt Readings from Last 3 Encounters:  04/11/13 380 lb 11.2 oz (172.684 kg)  03/13/13 396 lb (179.624 kg)  01/06/12 382 lb (173.274 kg)     PHYSICAL EXAM: GENERAL:  adult morbidly obese African American male. In no discomfort; no respiratory distress  PSYCH: alert and appropriate, good insight   HNEENT:   CARDIO: RRR, S1/S2 heard, no murmur  LUNGS: CTA B, no wheezes, no crackles  ABDOMEN:   EXTREM:  Warm, well perfused.  Moves all 4 extremities spontaneously; no lateralization.  Chronic venous stasis changes without ulceration.  2+/4 pretibial edema   GU:   SKIN:     Medications, Labs & Other Orders   Previous Medications   ASPIRIN 81 MG CHEWABLE TABLET    Chew 81 mg by mouth daily.     CARVEDILOL (COREG) 12.5 MG TABLET    Take 1 tablet (12.5 mg total) by mouth 2 (two) times daily with a meal.   Modified Medications   No medications on file   New Prescriptions   LISINOPRIL-HYDROCHLOROTHIAZIDE (PRINZIDE) 10-12.5 MG PER TABLET    Take 1 tablet by mouth daily.   Discontinued Medications   INDAPAMIDE (LOZOL) 2.5 MG TABLET    Take 1 tablet (2.5 mg total) by mouth daily.   Orders Placed This Encounter  Procedures  . Basic metabolic panel  . TSH  . T4, free  . T3  Assessment & Plan  As above & for further discussion see problem based charting if applicable.

## 2013-04-11 NOTE — Patient Instructions (Signed)
   Start Linsinopril/HCTZ daily.  Stop your Indapamide  Sleep study tonight  Keep working on your dietary changes and return to the [facility] to start a regular exercise routine.  Go get your compression stockings  Return in 2 weeks for a recheck of your kidney function.  Consider starting a vegetarian based diet (heavy vegetable intake , low salt, few animal products   If you need anything prior to your next visit please call the clinic. Please Bring all medications or accurate medication list with you to each appointment; an accurate medication list is essential in providing you the best care possible.

## 2013-04-25 ENCOUNTER — Other Ambulatory Visit: Payer: Medicaid Other

## 2013-04-25 DIAGNOSIS — E039 Hypothyroidism, unspecified: Secondary | ICD-10-CM

## 2013-04-25 DIAGNOSIS — N183 Chronic kidney disease, stage 3 unspecified: Secondary | ICD-10-CM

## 2013-04-25 DIAGNOSIS — I10 Essential (primary) hypertension: Secondary | ICD-10-CM

## 2013-04-25 LAB — BASIC METABOLIC PANEL
BUN: 19 mg/dL (ref 6–23)
CALCIUM: 9 mg/dL (ref 8.4–10.5)
CO2: 26 meq/L (ref 19–32)
Chloride: 104 mEq/L (ref 96–112)
Creat: 1.73 mg/dL — ABNORMAL HIGH (ref 0.50–1.35)
GLUCOSE: 89 mg/dL (ref 70–99)
Potassium: 4.4 mEq/L (ref 3.5–5.3)
SODIUM: 140 meq/L (ref 135–145)

## 2013-04-25 LAB — T4, FREE: Free T4: 1.2 ng/dL (ref 0.80–1.80)

## 2013-04-25 LAB — T3: T3, Total: 118.4 ng/dL (ref 80.0–204.0)

## 2013-04-25 LAB — TSH: TSH: 4.02 u[IU]/mL (ref 0.350–4.500)

## 2013-04-25 NOTE — Progress Notes (Signed)
BMP,TSH,F-T4 AND T3 DONE TODAY Madox Corkins

## 2013-05-14 ENCOUNTER — Ambulatory Visit (HOSPITAL_BASED_OUTPATIENT_CLINIC_OR_DEPARTMENT_OTHER): Payer: Medicaid Other | Attending: Sports Medicine

## 2013-05-14 VITALS — Ht 68.0 in | Wt 370.0 lb

## 2013-05-14 DIAGNOSIS — Z9989 Dependence on other enabling machines and devices: Secondary | ICD-10-CM

## 2013-05-14 DIAGNOSIS — G4730 Sleep apnea, unspecified: Secondary | ICD-10-CM

## 2013-05-14 DIAGNOSIS — G4710 Hypersomnia, unspecified: Secondary | ICD-10-CM | POA: Insufficient documentation

## 2013-05-14 DIAGNOSIS — G4733 Obstructive sleep apnea (adult) (pediatric): Secondary | ICD-10-CM | POA: Insufficient documentation

## 2013-05-14 DIAGNOSIS — R5381 Other malaise: Secondary | ICD-10-CM

## 2013-05-14 DIAGNOSIS — R5383 Other fatigue: Secondary | ICD-10-CM

## 2013-05-17 ENCOUNTER — Encounter: Payer: Self-pay | Admitting: Sports Medicine

## 2013-05-19 DIAGNOSIS — G4733 Obstructive sleep apnea (adult) (pediatric): Secondary | ICD-10-CM

## 2013-05-19 DIAGNOSIS — R5383 Other fatigue: Secondary | ICD-10-CM

## 2013-05-19 DIAGNOSIS — R5381 Other malaise: Secondary | ICD-10-CM

## 2013-05-19 NOTE — Sleep Study (Signed)
   NAME: Johnny [name] DATE OF BIRTH:  26-Oct-1960 MEDICAL RECORD NUMBER [id number]  LOCATION: [facility]  PHYSICIAN: Lotus Gover D  DATE OF STUDY: 05/14/2013  SLEEP STUDY TYPE: Nocturnal Polysomnogram               REFERRING PHYSICIAN: Gerda Diss, DO  INDICATION FOR STUDY: A hypersomnia with sleep apnea  EPWORTH SLEEPINESS SCORE:   5/24 HEIGHT: 5\' 8"  (172.7 cm)  WEIGHT: 370 lb (167.831 kg)    Body mass index is 56.27 kg/(m^2).  NECK SIZE: 18.5 in.  MEDICATIONS: Charted for review  SLEEP ARCHITECTURE: Split study protocol. During the diagnostic phase total sleep time 120 minutes with sleep efficiency 62.2%. Stage I was 27.9%, stage II 72.1%, stages 3 and REM were absent. Sleep latency 38 minutes, awake after sleep onset 36.5 minutes, arousal index 27. Bedtime medication: None  RESPIRATORY DATA: Apnea hypopnea index (AHI) 60 per hour. 120 total events scored including 2 obstructive apneas and 118 hypopneas. Events were not positional. CPAP was titrated to 9 CWP, AHI 2.4 per hour. He wore a large [employer] Simplus fullface mask with heated humidifier and EPR 3.  OXYGEN DATA: Moderately loud snoring before CPAP with oxygen desaturation to a nadir of 87% on room air. With CPAP control, snoring was prevented and mean oxygen saturation help 91.8% on room air.  CARDIAC DATA: Sinus rhythm with PACs  MOVEMENT/PARASOMNIA: Limb jerks were noted with a few arousals and awakenings. These were more significant before CPAP, indicating they were related to respiratory arousals.  IMPRESSION/ RECOMMENDATION:   1) Severe obstructive sleep apnea/hypoxia syndrome, AHI 60 per hour, non-positional. Moderately loud snoring with oxygen desaturation to a nadir of 87% on room air. 2) Successful CPAP titration to 9 CWP, AHI 2.4 per hour. He wore a large [employer] Simplus fullface mask with heated humidifier and EPR 3. Snoring was prevented and mean oxygen saturation help 91.8% on  room air.   Signed Baird Lyons M.D..  Deneise Lever Diplomate, [place] Board [employer]  ELECTRONICALLY SIGNED ON:  05/19/2013, 12:16 PM [place] PH: [phone]   FX: [phone] [place]

## 2013-05-21 ENCOUNTER — Telehealth: Payer: Self-pay | Admitting: Sports Medicine

## 2013-05-21 DIAGNOSIS — G4733 Obstructive sleep apnea (adult) (pediatric): Secondary | ICD-10-CM

## 2013-05-21 DIAGNOSIS — G4730 Sleep apnea, unspecified: Secondary | ICD-10-CM | POA: Insufficient documentation

## 2013-05-21 HISTORY — DX: Obstructive sleep apnea (adult) (pediatric): G47.33

## 2013-05-21 NOTE — Telephone Encounter (Signed)
Pt needs CPAP set up through [facility] for known OSA.  Successful CPAP titration to 9 CWP, AHI 2.4 per hour. He wore a large [employer] Simplus fullface mask with heated humidifier and EPR 3. Snoring was prevented and mean oxygen saturation help 91.8% on room air.

## 2013-05-22 ENCOUNTER — Encounter: Payer: Self-pay | Admitting: Clinical

## 2013-05-22 NOTE — Progress Notes (Signed)
Referral for CPAP was hard faxed to [facility]. Hunt Oris, MSW, [place]

## 2013-06-28 ENCOUNTER — Other Ambulatory Visit: Payer: Self-pay | Admitting: Sports Medicine

## 2013-06-28 NOTE — Telephone Encounter (Signed)
Pt called and needs a refill on his carvedilol called in. jw

## 2013-06-29 NOTE — Telephone Encounter (Signed)
This was already sent in I believe

## 2013-07-02 ENCOUNTER — Other Ambulatory Visit: Payer: Self-pay | Admitting: Sports Medicine

## 2013-07-18 ENCOUNTER — Telehealth: Payer: Self-pay | Admitting: Sports Medicine

## 2013-08-10 ENCOUNTER — Other Ambulatory Visit: Payer: Self-pay | Admitting: Sports Medicine

## 2013-08-13 ENCOUNTER — Other Ambulatory Visit: Payer: Self-pay | Admitting: Sports Medicine

## 2013-08-17 ENCOUNTER — Other Ambulatory Visit: Payer: Self-pay | Admitting: Sports Medicine

## 2013-09-05 ENCOUNTER — Encounter: Payer: Self-pay | Admitting: Family Medicine

## 2013-09-05 ENCOUNTER — Ambulatory Visit (INDEPENDENT_AMBULATORY_CARE_PROVIDER_SITE_OTHER): Payer: Medicaid Other | Admitting: Family Medicine

## 2013-09-05 VITALS — BP 156/111 | HR 79 | Temp 98.3°F | Wt 388.0 lb

## 2013-09-05 DIAGNOSIS — N183 Chronic kidney disease, stage 3 unspecified: Secondary | ICD-10-CM

## 2013-09-05 DIAGNOSIS — E039 Hypothyroidism, unspecified: Secondary | ICD-10-CM

## 2013-09-05 DIAGNOSIS — I10 Essential (primary) hypertension: Secondary | ICD-10-CM

## 2013-09-05 LAB — BASIC METABOLIC PANEL
BUN: 23 mg/dL (ref 6–23)
CALCIUM: 9 mg/dL (ref 8.4–10.5)
CHLORIDE: 107 meq/L (ref 96–112)
CO2: 22 mEq/L (ref 19–32)
Creat: 2.02 mg/dL — ABNORMAL HIGH (ref 0.50–1.35)
Glucose, Bld: 99 mg/dL (ref 70–99)
Potassium: 4 mEq/L (ref 3.5–5.3)
SODIUM: 138 meq/L (ref 135–145)

## 2013-09-05 LAB — POCT GLYCOSYLATED HEMOGLOBIN (HGB A1C): Hemoglobin A1C: 5.9

## 2013-09-05 MED ORDER — LISINOPRIL-HYDROCHLOROTHIAZIDE 20-25 MG PO TABS: 1.0000 | ORAL_TABLET | Freq: Every day | ORAL | Status: DC

## 2013-09-05 NOTE — Progress Notes (Signed)
Patient ID: Johnny [name], male   DOB: [birth year], 53 y.o.   MRN: [id number]   Subjective:  HPI:   Johnny [name] is a 53 y.o. male with a history of HTN, CKD, LVH, obesity and OSA here for follow up of HTN.  Hypertension: Patient here for follow-up of elevated blood pressure. He is exercising and is adherent to low salt diet.  Blood pressure is not checked at home, though numbers have been "good" at grocery store machines. Cardiac symptoms none. Patient denies chest pain, chest pressure/discomfort, dyspnea, fatigue, orthopnea, palpitations and syncope.   Use of agents associated with hypertension: none. History of target organ damage: chronic kidney disease and left ventricular hypertrophy.   He also complains of about 5 days of stable, partially improving dry cough with the feeling of chest congestion. No nasal symptoms. No fever, chills, sputum.   Review of Systems:  Per HPI. All other systems reviewed and are negative.    Past Medical History: Patient Active Problem List   Diagnosis Date Noted  . OSA (obstructive sleep apnea) 05/21/2013  . Diverticulosis of colon (without mention of hemorrhage) 01/31/2012  . Special screening for malignant neoplasms, colon 01/06/2012  . Obesity 12/02/2011  . Health care maintenance 12/02/2011  . Venous stasis of lower extremity 12/02/2011  . VENTRICULAR HYPERTROPHY, LEFT 07/28/2009  . CHRONIC KIDNEY DISEASE STAGE III (MODERATE) 07/28/2009  . Unspecified hypothyroidism 07/17/2009  . HYPERTENSION, BENIGN SYSTEMIC 04/21/2006    Medications: reviewed and updated Current Outpatient Prescriptions  Medication Sig Dispense Refill  . aspirin 81 MG chewable tablet Chew 81 mg by mouth daily.        . carvedilol (COREG) 12.5 MG tablet TAKE ONE TABLET BY MOUTH TWICE DAILY WITH  A  MEAL  60 tablet  5   No current facility-administered medications for this visit.    Objective:  Physical Exam: BP 156/111  Pulse 79  Temp(Src) 98.3 F (36.8 C) (Oral)  Wt  388 lb (175.996 kg) Manual recheck: 160/110  Gen: Obese, well-appearing, well-groomed 53 y.o. male in NAD HEENT: MMM, EOMI, PERRL, anicteric sclerae CV: Regular rate, no MRG, no JVD, PMI displaced laterally Resp: Non-labored, rhonchi in the midchest anteriorly, no wheezes or crackles noted Abd: Soft, NTND, BS present, no guarding or organomegaly MSK: No edema noted, full ROM Neuro: Alert and oriented, speech normal      Chemistry      Component Value Date/Time   NA 140 04/25/2013 1357   K 4.4 04/25/2013 1357   CL 104 04/25/2013 1357   CO2 26 04/25/2013 1357   BUN 19 04/25/2013 1357   CREATININE 1.73* 04/25/2013 1357   CREATININE 2.32* 11/08/2009 0355      Component Value Date/Time   CALCIUM 9.0 04/25/2013 1357   ALKPHOS 68 03/13/2013 1507   AST 16 03/13/2013 1507   ALT 12 03/13/2013 1507   BILITOT 0.6 03/13/2013 1507     No results found for this basename: HGBA1C   Assessment:     Johnny [name] is a 53 y.o. male here for HTN and viral respiratory tract infection.     Plan:     See problem list for problem-specific plans.

## 2013-09-05 NOTE — Patient Instructions (Signed)
-   Start taking lisinopril-HCTZ (prinzide) 20mg -25mg  once daily.  - Keep walking! Take 1 day off per week to rest your joints.  - Take tylenol as needed for knee pain.  - You can also take mucinex (you're looking for GUAIFENECIN as an active ingredient) for your mucus.   It was great to meet you. I'll see you in 3 months.  - Dr. Bonner Puna

## 2013-09-10 NOTE — Assessment & Plan Note (Signed)
Resolved without medication.

## 2013-09-10 NOTE — Assessment & Plan Note (Signed)
Chronic, poorly controlled despite reported compliance. Increase lisinopril-HCTZ to 20mg -25mg . Avoid NSAIDs. Continue exercising. BMP at follow up.

## 2014-03-13 ENCOUNTER — Encounter: Payer: Self-pay | Admitting: Family Medicine

## 2014-03-13 ENCOUNTER — Ambulatory Visit (INDEPENDENT_AMBULATORY_CARE_PROVIDER_SITE_OTHER): Payer: Medicaid Other | Admitting: Family Medicine

## 2014-03-13 VITALS — BP 161/95 | HR 75 | Temp 97.6°F | Ht 68.0 in | Wt 372.4 lb

## 2014-03-13 DIAGNOSIS — Z23 Encounter for immunization: Secondary | ICD-10-CM

## 2014-03-13 DIAGNOSIS — E669 Obesity, unspecified: Secondary | ICD-10-CM

## 2014-03-13 DIAGNOSIS — N183 Chronic kidney disease, stage 3 unspecified: Secondary | ICD-10-CM

## 2014-03-13 DIAGNOSIS — Z0000 Encounter for general adult medical examination without abnormal findings: Secondary | ICD-10-CM | POA: Diagnosis not present

## 2014-03-13 DIAGNOSIS — I10 Essential (primary) hypertension: Secondary | ICD-10-CM | POA: Diagnosis not present

## 2014-03-13 LAB — BASIC METABOLIC PANEL
BUN: 34 mg/dL — ABNORMAL HIGH (ref 6–23)
CALCIUM: 9.2 mg/dL (ref 8.4–10.5)
CHLORIDE: 107 meq/L (ref 96–112)
CO2: 23 mEq/L (ref 19–32)
CREATININE: 2.52 mg/dL — AB (ref 0.50–1.35)
Glucose, Bld: 100 mg/dL — ABNORMAL HIGH (ref 70–99)
POTASSIUM: 4.1 meq/L (ref 3.5–5.3)
Sodium: 139 mEq/L (ref 135–145)

## 2014-03-13 LAB — CBC
HEMATOCRIT: 40 % (ref 39.0–52.0)
HEMOGLOBIN: 13.7 g/dL (ref 13.0–17.0)
MCH: 30.2 pg (ref 26.0–34.0)
MCHC: 34.3 g/dL (ref 30.0–36.0)
MCV: 88.3 fL (ref 78.0–100.0)
MPV: 9.4 fL (ref 8.6–12.4)
Platelets: 263 10*3/uL (ref 150–400)
RBC: 4.53 MIL/uL (ref 4.22–5.81)
RDW: 14.7 % (ref 11.5–15.5)
WBC: 6.2 10*3/uL (ref 4.0–10.5)

## 2014-03-13 MED ORDER — TRAZODONE HCL 150 MG PO TABS: 150.0000 mg | ORAL_TABLET | Freq: Every evening | ORAL | Status: DC | PRN

## 2014-03-13 MED ORDER — LISINOPRIL-HYDROCHLOROTHIAZIDE 20-25 MG PO TABS: 2.0000 | ORAL_TABLET | Freq: Every day | ORAL | Status: DC

## 2014-03-13 NOTE — Progress Notes (Signed)
Patient ID: Johnny [name], male   DOB: May 31, 1960, 54 y.o.   MRN: [id number] Subjective: Johnny [name] is a 54 y.o. male with a history of HTN, LVH, OSA, CKD III, and extreme obesity here for annual exam.  Has been put on academic probation and he feels this is due to feeling tired when arriving to class due to deconditioning. It is impossible for him to get to class earlier due to other schedule conflicts. He DOES admit that this is likely not the primary reason for poor academic performance as the subject matter is difficult for him.   Reports compliance with medications. Denies HA, syncope, vision changes, dizziness, palpitations, orthopnea, LE swelling.  - Nonsmoker - Review of Systems: Per HPI. All other systems reviewed and are negative. - Past Medical History: Patient Active Problem List   Diagnosis Date Noted  . OSA (obstructive sleep apnea) 05/21/2013  . Diverticulosis of colon (without mention of hemorrhage) 01/31/2012  . Obesity 12/02/2011  . Venous stasis of lower extremity 12/02/2011  . VENTRICULAR HYPERTROPHY, LEFT 07/28/2009  . CHRONIC KIDNEY DISEASE STAGE III (MODERATE) 07/28/2009  . HYPERTENSION, BENIGN SYSTEMIC 04/21/2006   - Medications: reviewed and updated . aspirin 81 MG chewable tablet  daily.    . carvedilol (COREG) 12.5 MG tablet BID  . lisinopril-hydrochlorothiazide 20-25 MG  daily.   Objective: BP 161/95 mmHg  Pulse 75  Temp(Src) 97.6 F (36.4 C) (Oral)  Ht 5\' 8"  (1.727 m)  Wt 372 lb 6.4 oz (168.92 kg)  BMI 56.64 kg/m2 Gen: Very obese 54 y.o. male in no distress HEENT: MMM, EOMI, PERRL, anicteric sclerae CV: Regular rate, no murmur, rub or gallop, no JVD Resp: Non-labored, CTAB, no wheezes noted Abd: Soft, NT, ND, BS present, no guarding or organomegaly MSK: No edema noted, full ROM Neuro: Alert and oriented, speech normal   Chemistry      Component Value Date/Time   NA 138 09/05/2013 1101   K 4.0 09/05/2013 1101   CL 107 09/05/2013 1101   CO2 22  09/05/2013 1101   BUN 23 09/05/2013 1101   CREATININE 2.02* 09/05/2013 1101   CREATININE 2.32* 11/08/2009 0355      Component Value Date/Time   CALCIUM 9.0 09/05/2013 1101   ALKPHOS 68 03/13/2013 1507   AST 16 03/13/2013 1507   ALT 12 03/13/2013 1507   BILITOT 0.6 03/13/2013 1507      Lab Results  Component Value Date   WBC 6.3 03/13/2013   HGB 14.9 03/13/2013   HCT 43.5 03/13/2013   MCV 84.3 03/13/2013   PLT 329 03/13/2013   Lab Results  Component Value Date   TSH 4.020 04/25/2013   Lab Results  Component Value Date   HGBA1C 5.9 09/05/2013   Lab Results  Component Value Date   CHOL 170 03/13/2013   HDL 49 03/13/2013   LDLCALC 99 03/13/2013   LDLDIRECT 94 04/06/2007   TRIG 112 03/13/2013   CHOLHDL 3.5 03/13/2013    Assessment/Plan: Johnny [name] is a 54 y.o. male here for annual visit.

## 2014-03-13 NOTE — Patient Instructions (Signed)
Take the lisinopril-HCTZ 20-25mg 

## 2014-03-19 NOTE — Assessment & Plan Note (Signed)
Increase lisinopril-HCTZ to 20-25mg  BID as pt prefers not to add an additional medication.  - Check renal function (CKD III) - Avoid NSAIDs - Follow up in 2-4 weeks. - DASH diet and weight loss discussed in great detail.

## 2014-04-12 ENCOUNTER — Ambulatory Visit: Payer: Medicaid Other | Admitting: Family Medicine

## 2014-05-01 ENCOUNTER — Ambulatory Visit: Payer: Medicaid Other | Admitting: Family Medicine

## 2014-06-21 ENCOUNTER — Other Ambulatory Visit: Payer: Self-pay | Admitting: Family Medicine

## 2014-06-21 DIAGNOSIS — K5730 Diverticulosis of large intestine without perforation or abscess without bleeding: Secondary | ICD-10-CM

## 2014-06-21 DIAGNOSIS — I10 Essential (primary) hypertension: Secondary | ICD-10-CM

## 2014-06-21 DIAGNOSIS — I119 Hypertensive heart disease without heart failure: Secondary | ICD-10-CM

## 2014-06-21 DIAGNOSIS — N183 Chronic kidney disease, stage 3 unspecified: Secondary | ICD-10-CM

## 2014-06-21 NOTE — Telephone Encounter (Signed)
Needs refill on carvedilol (COREG) 25 MG tablet / thanks [employer], ASA

## 2014-06-24 ENCOUNTER — Other Ambulatory Visit: Payer: Self-pay | Admitting: *Deleted

## 2014-06-24 ENCOUNTER — Encounter: Payer: Self-pay | Admitting: Family Medicine

## 2014-06-24 DIAGNOSIS — I10 Essential (primary) hypertension: Secondary | ICD-10-CM

## 2014-06-24 MED ORDER — LISINOPRIL-HYDROCHLOROTHIAZIDE 20-25 MG PO TABS: 2.0000 | ORAL_TABLET | Freq: Every day | ORAL | Status: DC

## 2014-06-24 MED ORDER — CARVEDILOL 12.5 MG PO TABS: 12.5000 mg | ORAL_TABLET | Freq: Two times a day (BID) | ORAL | Status: DC

## 2014-06-24 NOTE — Assessment & Plan Note (Signed)
No ECG's imported into EPIC (Dx precedes 2012). Should get ECG at next office visit as well as continued symptomatic screening of dCHF.

## 2014-06-24 NOTE — Telephone Encounter (Signed)
Patient called to ask for refill again.  Original request sent on Friday.  Informed patient that we have until today or tomorrow to complete.  Provider will be in clinic on Tuesday this week.  Probably will finish rx at that time.  Patient agreeable to info.

## 2014-06-24 NOTE — Assessment & Plan Note (Signed)
Needs follow up BMP very soon. With LVH, adding CCB to ACE/HCTZ makes more sense than beta blocker. I'm not sure there's a compelling indication for BB. Will continue this, however, until follow up.

## 2014-06-24 NOTE — Telephone Encounter (Signed)
Letter mailed to patient to call and schedule an appt. Jazmin Hartsell,CMA

## 2014-06-24 NOTE — Telephone Encounter (Signed)
Sent electronically to pharmacy: coreg and lisinopril-HCTZ.

## 2014-06-24 NOTE — Assessment & Plan Note (Signed)
Renal function seems to be steadily declining; CrCl on most recent labs is 82 ml/min (52.42ml/min when adjusted for BMI) indicating CKD stage II - III. Will require monitoring labs at next visit. Continue ACE for renal protection. Consider switching thiazide to loop if renal function impairment continues.

## 2014-09-05 ENCOUNTER — Encounter (HOSPITAL_COMMUNITY): Payer: Self-pay | Admitting: Neurology

## 2014-09-05 ENCOUNTER — Emergency Department (HOSPITAL_COMMUNITY): Payer: Medicaid Other

## 2014-09-05 ENCOUNTER — Other Ambulatory Visit: Payer: Self-pay

## 2014-09-05 ENCOUNTER — Emergency Department (HOSPITAL_BASED_OUTPATIENT_CLINIC_OR_DEPARTMENT_OTHER): Admit: 2014-09-05 | Discharge: 2014-09-05 | Disposition: A | Discharge status: Home / Self Care | Payer: Medicaid Other

## 2014-09-05 ENCOUNTER — Ambulatory Visit: Payer: Medicaid Other | Admitting: Family Medicine

## 2014-09-05 ENCOUNTER — Emergency Department (HOSPITAL_COMMUNITY)
Admission: EM | Admit: 2014-09-05 | Discharge: 2014-09-05 | Disposition: A | Discharge status: Home / Self Care | Payer: Medicaid Other | Attending: Emergency Medicine | Admitting: Emergency Medicine

## 2014-09-05 DIAGNOSIS — R2242 Localized swelling, mass and lump, left lower limb: Secondary | ICD-10-CM | POA: Diagnosis not present

## 2014-09-05 DIAGNOSIS — Z79899 Other long term (current) drug therapy: Secondary | ICD-10-CM | POA: Diagnosis not present

## 2014-09-05 DIAGNOSIS — Z7982 Long term (current) use of aspirin: Secondary | ICD-10-CM | POA: Diagnosis not present

## 2014-09-05 DIAGNOSIS — T783XXA Angioneurotic edema, initial encounter: Secondary | ICD-10-CM | POA: Diagnosis not present

## 2014-09-05 DIAGNOSIS — Z86711 Personal history of pulmonary embolism: Secondary | ICD-10-CM | POA: Diagnosis not present

## 2014-09-05 DIAGNOSIS — K1379 Other lesions of oral mucosa: Secondary | ICD-10-CM | POA: Diagnosis not present

## 2014-09-05 DIAGNOSIS — R600 Localized edema: Secondary | ICD-10-CM

## 2014-09-05 DIAGNOSIS — T464X5A Adverse effect of angiotensin-converting-enzyme inhibitors, initial encounter: Secondary | ICD-10-CM

## 2014-09-05 DIAGNOSIS — Z8669 Personal history of other diseases of the nervous system and sense organs: Secondary | ICD-10-CM | POA: Diagnosis not present

## 2014-09-05 DIAGNOSIS — R609 Edema, unspecified: Secondary | ICD-10-CM

## 2014-09-05 DIAGNOSIS — I10 Essential (primary) hypertension: Secondary | ICD-10-CM | POA: Insufficient documentation

## 2014-09-05 DIAGNOSIS — R0602 Shortness of breath: Secondary | ICD-10-CM | POA: Diagnosis present

## 2014-09-05 MED ORDER — DIPHENHYDRAMINE HCL 50 MG/ML IJ SOLN
25.0000 mg | Freq: Once | INTRAMUSCULAR | Status: AC
  Administered 2014-09-05: 25 mg via INTRAVENOUS
  Filled 2014-09-05: qty 1

## 2014-09-05 MED ORDER — FAMOTIDINE IN NACL 20-0.9 MG/50ML-% IV SOLN
20.0000 mg | Freq: Once | INTRAVENOUS | Status: AC
  Administered 2014-09-05: 20 mg via INTRAVENOUS
  Filled 2014-09-05: qty 50

## 2014-09-05 MED ORDER — SODIUM CHLORIDE 0.9 % IV SOLN
INTRAVENOUS | Status: DC
  Administered 2014-09-05: 17:00:00 via INTRAVENOUS

## 2014-09-05 MED ORDER — DEXAMETHASONE SODIUM PHOSPHATE 10 MG/ML IJ SOLN
10.0000 mg | Freq: Once | INTRAMUSCULAR | Status: AC
  Administered 2014-09-05: 10 mg via INTRAVENOUS
  Filled 2014-09-05: qty 1

## 2014-09-05 NOTE — ED Notes (Signed)
Patient transported returned from  Ultrasound

## 2014-09-05 NOTE — Progress Notes (Signed)
VASCULAR LAB PRELIMINARY  PRELIMINARY  PRELIMINARY  PRELIMINARY  Right lower extremity venous Doppler completed.    Preliminary report:  There is no obvious evidence of DVT or SVT in the visualized veins of the right lower extremity. Significant interstitial fluid noted throughout. This study was technically limited and difficult secondary to body habitus and edema.   Johnny [name], RVT 09/05/2014, 6:57 PM

## 2014-09-05 NOTE — Discharge Instructions (Signed)
STOP TAKING YOUR LISINOPRIL, you must let every doctor you have now in the future that you have had a severe allergic reaction to lisinopril, you are probably allergic to all ace inhibitors.   Stop taking your lisinopril, you can continue continue to take her carvedilol.   Do not hesitate to return to the emergency room for any new, worsening or concerning symptoms.   You must follow with your primary care doctor in the next week, they will have to choose an alternative hypertension medication to replace lisinopril.   Angioedema Angioedema is sudden puffiness (swelling), often of the skin. It can happen:  On your face or privates (genitals).  In your belly (abdomen) or other body parts. It usually happens quickly and gets better in 1 or 2 days. It often starts at night and is found when you wake up. You may get red, itchy patches of skin (hives). Attacks can be dangerous if your breathing passages get puffy. The condition may happen only once, or it can come back at random times. It may happen for several years before it goes away for good. HOME CARE  Only take medicines as told by your doctor.  Always carry your emergency allergy medicines with you.  Wear a medical bracelet as told by your doctor.  Avoid things that you know will cause attacks (triggers). GET HELP IF:  You have another attack.  Your attacks happen more often or get worse.  The condition was passed to you by your parents and you want to have children. GET HELP RIGHT AWAY IF:   Your mouth, tongue, or lips are very puffy.  You have trouble breathing.  You have trouble swallowing.  You pass out (faint). MAKE SURE YOU:   Understand these instructions.  Will watch your condition.  Will get help right away if you are not doing well or get worse. Document Released: 01/27/2009 Document Revised: 11/29/2012 Document Reviewed: 10/02/2012 [facility] Patient Information 2015 [place], [place]. This information is not  intended to replace advice given to you by your health care provider. Make sure you discuss any questions you have with your health care provider.

## 2014-09-05 NOTE — ED Notes (Signed)
Pt. Left with all belongings 

## 2014-09-05 NOTE — ED Notes (Signed)
Pt reports onset sob this morning; denies cp. BLE pitting edema, congestion noted with pts breathing. Coughing up phlegm.

## 2014-09-05 NOTE — ED Provider Notes (Signed)
CSN: [id number]     Arrival date & time 09/05/14  1523 History   First MD Initiated Contact with Patient 09/05/14 1617     Chief Complaint  Patient presents with  . Shortness of Breath     (Consider location/radiation/quality/duration/timing/severity/associated sxs/prior Treatment) HPI   Blood pressure 161/83, pulse 70, temperature 97.7 F (36.5 C), temperature source Oral, resp. rate 20, SpO2 97 %.  Johnny [name] is a 54 y.o. male complaining of shortness of breath onset this morning. Patient is spitting out his saliva, states that he is almost simple, he's been on this for years he only takes this and really aspirin. Any issues in the past. Patient denies new meds, new oral intake, new environmental exposures, rash, nausea, vomiting. Patient states that his left lower extremity has been swelling more so than normal. He has a history of PE but he doesn't take any anticoagulation. Patient denies chest pain or shortness of breath.   Past Medical History  Diagnosis Date  . 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 Hypertension   . Essential HTN 04/21/2006  . Diverticulosis of large intestine without hemorrhage 01/31/2012  . OSA 05/21/2013   Past Surgical History  Procedure Laterality Date  . No past surgeries    . Colonoscopy  01/31/2012    Procedure: COLONOSCOPY;  Surgeon: Inda Castle, MD;  Location: [facility];  Service: Endoscopy;  Laterality: N/A;  bmi is 53   Family History  Problem Relation Age of Onset  . Breast cancer Mother   . Colon cancer Father     died at 51  . Hypertension Brother   . Hypertension Mother    History  Substance Use Topics  . Smoking status: Never Smoker   . Smokeless tobacco: Never Used  . Alcohol Use: Yes     Comment: social-once a week    Review of Systems  10 systems reviewed and found to be negative, except as noted in the HPI.  Allergies  Review of patient's allergies indicates no known allergies.  Home Medications   Prior to Admission  medications   Medication Sig Start Date End Date Taking? Authorizing Provider  aspirin 81 MG chewable tablet Chew 81 mg by mouth daily.      Historical Provider, MD  carvedilol (COREG) 12.5 MG tablet Take 1 tablet (12.5 mg total) by mouth 2 (two) times daily with a meal. 06/24/14   Patrecia Pour, MD  lisinopril-hydrochlorothiazide (PRINZIDE,ZESTORETIC) 20-25 MG per tablet Take 2 tablets by mouth daily. 06/24/14   Patrecia Pour, MD   BP 161/83 mmHg  Pulse 70  Temp(Src) 97.7 F (36.5 C) (Oral)  Resp 20  SpO2 97% Physical Exam  Constitutional: He is oriented to person, place, and time. He appears well-developed and well-nourished. No distress.  Morbidly obese, spitting out his secretions, patient is speaking in complete sentences  Voice is abnormal  HENT:  Head: Normocephalic and atraumatic.  Mouth/Throat: Oropharynx is clear and moist.  Eyes: Conjunctivae and EOM are normal. Pupils are equal, round, and reactive to light.  Neck: Normal range of motion.  Cardiovascular: Normal rate, regular rhythm and intact distal pulses.   Pulmonary/Chest: Effort normal and breath sounds normal.  Sounds with good air movement, transmitted upper airway sounds, no stridor.  Abdominal: Soft. There is no tenderness.  Musculoskeletal: Normal range of motion.  Neurological: He is alert and oriented to person, place, and time.  Skin: He is not diaphoretic.  Psychiatric: He has a normal mood and affect.  Nursing  note and vitals reviewed.   ED Course  Procedures (including critical care time) Labs Review Labs Reviewed - No data to display  Imaging Review Dg Chest 2 View  09/05/2014   CLINICAL DATA:  Shortness of breath starting this morning  EXAM: CHEST  2 VIEW  COMPARISON:  11/07/2009  FINDINGS: Cardiomediastinal silhouette is unremarkable. No acute infiltrate or pleural effusion. No pulmonary edema. Degenerative changes lower thoracic spine.  IMPRESSION: No active cardiopulmonary disease.   Electronically  Signed   By: Lahoma Crocker M.D.   On: 09/05/2014 15:57     EKG Interpretation   Date/Time:  Thursday September 05 2014 15:35:23 EDT Ventricular Rate:  69 PR Interval:  152 QRS Duration: 88 QT Interval:  418 QTC Calculation: 447 R Axis:   33 Text Interpretation:  Normal sinus rhythm Low voltage QRS Septal infarct ,  age undetermined No significant change since last tracing Confirmed by  [facility]  MD, Loree Fee ([id number]) on 09/05/2014 4:18:47 PM      MDM   Final diagnoses:  ACE inhibitor-aggravated angioedema, initial encounter  Edema of left lower extremity  Uvular swelling    Filed Vitals:   09/05/14 1830 09/05/14 1930 09/05/14 1944 09/05/14 1945  BP: 124/67 152/90 154/69 141/79  Pulse: 59 71 70 76  Temp:      TempSrc:      Resp:  18 23 24   SpO2: 96% 96% 98% 96%    Medications  0.9 %  sodium chloride infusion ( Intravenous New Bag/Given 09/05/14 1642)  dexamethasone (DECADRON) injection 10 mg (10 mg Intravenous Given 09/05/14 1642)  diphenhydrAMINE (BENADRYL) injection 25 mg (25 mg Intravenous Given 09/05/14 1644)  famotidine (PEPCID) IVPB 20 mg premix (0 mg Intravenous Stopped 09/05/14 1906)    Shamel Abidi is a pleasant 54 y.o. male presenting with shortness of breath, patient is not able to handle his secretions. He has significant angioedema with edematous uvula, his lung sounds with good air movement.  8:15 PM: Patient seen and reexamined after he returns from vascular study. He reports improvement, states he is able to swallow his secretions. Physical exam still shows a very edematous uvula however his voice is improving and normalizing. I want to watch the patient for several more hours. Because he has swelling to the left lower extremity and order some basic blood work and a DVT study.  DVT study is negative.  Patient's blood work is back and unfortunately it's not crossing over into the computer system however it is reported on paper and there are no significant  abnormalities, patient seen and reevaluated at 10:24 PM, his voice has essentially normalized and the uvula is also significantly smaller. He continues to improve and improve and I think he is stable for discharge to home. We've had an extensive discussion that he is to stop taking lisinopril, I've listed as an allergy in his chart. He is to follow with his primary care physician for alternative hypertension medication management.  This is a shared visit with the attending physician who personally evaluated the patient and agrees with the care plan.   Evaluation does not show pathology that would require ongoing emergent intervention or inpatient treatment. Pt is hemodynamically stable and mentating appropriately. Discussed findings and plan with patient/guardian, who agrees with care plan. All questions answered. Return precautions discussed and outpatient follow up given.    Monico Blitz, PA-C 09/05/14 2236  Virgel Manifold, MD 09/06/14 [phone]

## 2014-09-06 LAB — CBC
HCT: 39 % (ref 39.0–52.0)
HEMOGLOBIN: 13.4 g/dL (ref 13.0–17.0)
MCH: 29.9 pg (ref 26.0–34.0)
MCHC: 34.4 g/dL (ref 30.0–36.0)
MCV: 87.1 fL (ref 78.0–100.0)
Platelets: 270 10*3/uL (ref 150–400)
RBC: 4.48 MIL/uL (ref 4.22–5.81)
RDW: 13.9 % (ref 11.5–15.5)
WBC: 8.1 10*3/uL (ref 4.0–10.5)

## 2014-09-06 LAB — BASIC METABOLIC PANEL
ANION GAP: 8 (ref 5–15)
BUN: 32 mg/dL — ABNORMAL HIGH (ref 6–20)
CO2: 22 mmol/L (ref 22–32)
Calcium: 9 mg/dL (ref 8.9–10.3)
Chloride: 106 mmol/L (ref 101–111)
Creatinine, Ser: 2.56 mg/dL — ABNORMAL HIGH (ref 0.61–1.24)
GFR calc Af Amer: 31 mL/min — ABNORMAL LOW (ref >60)
GFR calc non Af Amer: 27 mL/min — ABNORMAL LOW (ref >60)
Glucose, Bld: 106 mg/dL — ABNORMAL HIGH (ref 65–99)
Potassium: 4.5 mmol/L (ref 3.5–5.1)
Sodium: 136 mmol/L (ref 135–145)

## 2014-09-06 LAB — BRAIN NATRIURETIC PEPTIDE: B Natriuretic Peptide: 21.5 pg/mL (ref 0.0–100.0)

## 2014-09-12 ENCOUNTER — Ambulatory Visit (INDEPENDENT_AMBULATORY_CARE_PROVIDER_SITE_OTHER): Payer: Medicaid Other | Admitting: Family Medicine

## 2014-09-12 ENCOUNTER — Encounter: Payer: Self-pay | Admitting: Family Medicine

## 2014-09-12 VITALS — BP 154/90 | HR 72 | Temp 98.3°F | Ht 68.0 in | Wt 385.8 lb

## 2014-09-12 DIAGNOSIS — I878 Other specified disorders of veins: Secondary | ICD-10-CM | POA: Diagnosis not present

## 2014-09-12 DIAGNOSIS — I10 Essential (primary) hypertension: Secondary | ICD-10-CM

## 2014-09-12 DIAGNOSIS — N183 Chronic kidney disease, stage 3 unspecified: Secondary | ICD-10-CM

## 2014-09-12 LAB — POCT URINALYSIS DIPSTICK
BILIRUBIN UA: NEGATIVE
Glucose, UA: NEGATIVE
Ketones, UA: NEGATIVE
Nitrite, UA: NEGATIVE
PH UA: 6
Protein, UA: 30
Spec Grav, UA: 1.015
Urobilinogen, UA: 0.2

## 2014-09-12 LAB — POCT GLYCOSYLATED HEMOGLOBIN (HGB A1C): HEMOGLOBIN A1C: 5.5

## 2014-09-12 LAB — POCT UA - MICROSCOPIC ONLY

## 2014-09-12 MED ORDER — HYDROCHLOROTHIAZIDE 25 MG PO TABS: 25.0000 mg | ORAL_TABLET | Freq: Every day | ORAL | Status: DC

## 2014-09-12 NOTE — Addendum Note (Signed)
Addended by: Vance Gather B on: 09/12/2014 04:43 PM   Modules accepted: Orders

## 2014-09-12 NOTE — Assessment & Plan Note (Addendum)
Has tried exercise primarily for > 6 months and would like MNT. Checking A1c.

## 2014-09-12 NOTE — Addendum Note (Signed)
Addended by: [place] Pink on: 09/12/2014 04:48 PM   Modules accepted: Orders

## 2014-09-12 NOTE — Assessment & Plan Note (Addendum)
Will start HCTZ back. ACE added to allergy list for angioedema on 7/14. With renal impairment will stick with low dose. CCB is another consideration though this would likely worsen his already severe LE swelling. Follow up with BMP in 1 month.

## 2014-09-12 NOTE — Assessment & Plan Note (Signed)
Will refer to nephrology for continuing renal insufficiency.

## 2014-09-12 NOTE — Patient Instructions (Signed)
As we discussed, I think you should see a kidney doctor so I placed a non-urgent referral today. Someone should be in contact with you to schedule this. In the meantime, someone should be in contact with you about establishing care for nutritional counseling.   Start taking HCTZ for blood pressure again and follow up in 1 month. If you notice ANY of the same symptoms that sent you to the hospital last time go directly to the ED.

## 2014-09-12 NOTE — Assessment & Plan Note (Signed)
Getting new compression stockings tomorrow.

## 2014-09-12 NOTE — Progress Notes (Signed)
Subjective: Johnny [name] is a 54 y.o. male presenting for ED follow up.   Had likely allergic reaction though to be due to lisinopril and presented to the ED on 7/14. Has not taken it or HCTZ (combination pill) since then. The symptoms have not returned.  He has continued to work out at [employer] but takes several days off per week because of "over doing it." He wonders if milk could be contributing to his obesity.   - ROS: Denies CP, SOB, palpitations, syncope, dizziness, orthopnea, PND, frequent headaches, vision changes, claudication. No changes in urine habits or character. Some LE swelling symmetrically that goes down at night - getting new compression stockings tomorrow.  - PMFSH: non-smoker, 4 cans of beer per week never more than 2 at a time, no illicit drugs. - Medications: reviewed and updated  Objective: BP 154/90 mmHg  Pulse 72  Temp(Src) 98.3 F (36.8 C) (Oral)  Ht 5\' 8"  (1.727 m)  Wt 385 lb 12.8 oz (174.998 kg)  BMI 58.67 kg/m2 Gen: Morbidly obese 54 y.o. male in no distress HEENT: Normocephalic, sclerae clear, conjunctivae normal, nares normal, moist mucous membranes, posterior oropharynx clear, tongue normal, fair dentition CV: Regular rate, no murmur; radial, DP and PT pulses 2+ bilaterally; 2+ LE edema, difficult to appreciate JVD, cap refill < 2 sec. Skin: Stasis dermatitis changes bilateral LE's. No wounds or discoloration Pulm: Non-labored breathing ambient air; CTAB, no wheezes or crackles GI: Normal bowel sounds, very obese limiting exam. Nontender.  Lab Results  Component Value Date   CREATININE 2.56* 09/05/2014   Assessment/Plan: Johnny [name] is a 54 y.o. male here for ED follow up.   See problem list for plan.

## 2014-09-13 LAB — PROTEIN / CREATININE RATIO, URINE
CREATININE, URINE: 111.3 mg/dL
PROTEIN CREATININE RATIO: 0.39 — AB (ref <0.15)
TOTAL PROTEIN, URINE: 43 mg/dL — AB (ref 5–25)

## 2014-09-23 ENCOUNTER — Encounter: Payer: Self-pay | Admitting: Family Medicine

## 2014-09-23 ENCOUNTER — Ambulatory Visit (INDEPENDENT_AMBULATORY_CARE_PROVIDER_SITE_OTHER): Payer: Medicaid Other | Admitting: Family Medicine

## 2014-09-23 VITALS — Ht 68.0 in | Wt 383.5 lb

## 2014-09-23 DIAGNOSIS — I10 Essential (primary) hypertension: Secondary | ICD-10-CM | POA: Diagnosis not present

## 2014-09-23 NOTE — Patient Instructions (Addendum)
GOALS: 1. Walk at least 30-45 min 6 days a week.   2. Eat at least 3 REAL meals and 1-2 snacks per day every day.  Aim for no more than 5 hours between eating.  Eat breakfast within one hour of getting up.   - A REAL MEAL includes a protein source, some starch, and some veg's (or fruit, if it's breakfast).    - Some breakfast ideas:  2 eggs, 2 slc wholewheat toast, cantaloupe (or other fruit).         Oatmeal with berries, 1 cup of yogurt      High-fiber cereal (Frosted Mini-Wheats or Raisin Bran) with berries and 1% milk.        Dinner leftovers (protein, starch, and veg and/or fruit) 3. Eat vegetables at both lunch and dinner every day.  This will help in your blood pressure management as well as weight loss.   Starchy (carb) foods: Bread, rice, pasta, potatoes, corn, crackers, bagels, muffins, all baked goods.  (Fruits, milk, and yogurt also have carbohydrate, but most of these foods will not spike your blood sugar as the starchy foods will.)  A few fruits do cause high blood sugars; use small portions of bananas (limit to 1/2 at a time), grapes, watermelon, and most tropical fruits.    Protein foods: Meat, fish, poultry, eggs, dairy foods, and beans such as pinto and kidney beans (beans also provide carbohydrate).

## 2014-09-23 NOTE — Progress Notes (Signed)
Medical Nutrition Therapy:  Appt start time: 1430 end time:  [id number].  Assessment:  Primary concerns today: Weight management Johnny BP management.  Johnny [name] wants to understand better what foods he should eat for better BP mgmt Johnny to avoid heart Johnny further renal pblms (dialysis).  He has made some dietary changes since talking with Dr. Bonner Puna.  His mother needed dialysis, so he understands the ramifications of severe renal disease.    Johnny [name], Johnny wants to [name] with youth.    Learning Readiness: Ready; has rejoined [employer], Johnny has done some walking.  Has cut out soda Johnny SSB, as well as cut down from ~8 beers/week to 2/wk.  Drinks almost exclusively water now.    Usual eating pattern includes 2 meals Johnny 1 snack per day. Frequent foods Johnny beverages include Pepsi, fast food, (not since last appt w/ Dr. Bonner Puna).  Avoided foods include none.   Usual physical activity includes walking ~30 min ~2 X wk; also uses 10-lb db's for ~10 min 3-4 X wk.   Johnny [name] said he is sleeping better since making some changes in the past two weeks, but he cannot tolerate his CPAP machine.    24-hr recall: (Up at 9 AM) B ( AM)-   To church all morning Snk ( AM)-   --- L (1 PM)-  1 chsbgr, 1 hot dog, both on buns, mayo, water Snk ( PM)-  water D (9 PM)-  8 oz steak, 1 large baked potato, 1 tsp butter, 1 tsp sour cream, side salad, 2 tbsp drsng, water Snk ( PM)-  water Typical day? Yes.    Progress Towards Goal(s):  In progress.   Nutritional Diagnosis:  [place]-3.3 Overweight/obesity As related to poor energy balance.  As evidenced by BMI >50.    Intervention:  Nutrition education.  Handouts given during visit include:  AVS  Goals Sheet  Demonstrated degree of understanding via:  Teach Back   Monitoring/Evaluation:  Dietary intake, exercise, Johnny body weight in 5 week(s).

## 2014-10-14 ENCOUNTER — Other Ambulatory Visit: Payer: Self-pay | Admitting: Family Medicine

## 2014-10-14 DIAGNOSIS — I10 Essential (primary) hypertension: Secondary | ICD-10-CM

## 2014-10-14 MED ORDER — HYDROCHLOROTHIAZIDE 25 MG PO TABS: 25.0000 mg | ORAL_TABLET | Freq: Every day | ORAL | Status: DC

## 2014-10-14 NOTE — Telephone Encounter (Signed)
Refill request for HCTZ.

## 2014-10-29 ENCOUNTER — Ambulatory Visit: Payer: Medicaid Other | Admitting: Family Medicine

## 2014-11-12 ENCOUNTER — Ambulatory Visit (INDEPENDENT_AMBULATORY_CARE_PROVIDER_SITE_OTHER): Payer: Medicaid Other | Admitting: Family Medicine

## 2014-11-12 ENCOUNTER — Encounter: Payer: Self-pay | Admitting: Family Medicine

## 2014-11-12 VITALS — Ht 68.0 in | Wt 391.1 lb

## 2014-11-12 DIAGNOSIS — I10 Essential (primary) hypertension: Secondary | ICD-10-CM

## 2014-11-12 DIAGNOSIS — Z6841 Body Mass Index (BMI) 40.0 and over, adult: Secondary | ICD-10-CM

## 2014-11-12 NOTE — Patient Instructions (Signed)
-   If your ankle gets worse, or is not better in a few days, see a doctor! Goals remain the same:  1. Eat at least 3 meals and 1-2 snacks per day.  Aim for no more than 5 hours between eating.  Eat breakfast within one hour of getting up.  2. Get vegetables at both lunch and dinner.   3. Walk at least 30 minutes 6 X week.    - Complete your Goals Sheet, and bring to your follow-up appt.    - If you don't get all the checkmarks you want on that sheet, why? (Sit down and give it some focused thought)  - If you do have a lot of success in these goals, also consider why.  What has been helpful to you in following through?    - You may want to House your thoughts on this.  If you do, bring this also to follow-up.

## 2014-11-12 NOTE — Progress Notes (Signed)
Medical Nutrition Therapy:  Appt start time: 1430 end time:  [id number].  Assessment:  Primary concerns today: Weight management and BP management.  Johnny [name] said he has been financially stressed recently, to which he attributes his falling off track with his diet and exercise.  In addn, two days ago, he twisted his ankle while walking, but he can still walk, only slower.  He said he is beginning to get back into a healthy routine, and would like to keep working on the behavior goals set at last MNT appt.    We talked briefly about dealing with stress in daily life, including "good stress" like the upcoming [employer] in Oct, when he'll have 4 or 5 friends visiting and partying together.  He'd already thought about preparing different foods for himself that weekend, but I encouraged him to instead make some of the usual foods as well as some healthier alternatives that both he and others can enjoy.    24-hr recall suggests intake of ~1500 kcal:  (Up at 9 AM) B (9:30 AM)-  1 salmon patty, 1 c cooked grits, 1/2 t butter, water 400 Snk ( AM)-   L (3 PM)-  5 chx wings, 2 T ranch dressing, water  350 Snk ( PM)-   D (8 PM)-  2 pc lasagne, salad, 2 T dressing, water  700(+) Snk ( PM)-   Typical day? Yes.     Progress Towards Goal(s):  In progress.   Nutritional Diagnosis:  [place]-3.3 Overweight/obesity As related to poor energy balance.  As evidenced by BMI >50.    Intervention:  Nutrition education.  Handouts given during visit include:  AVS  Goals Sheet  Demonstrated degree of understanding via:  Teach Back   Monitoring/Evaluation:  Dietary intake, exercise, and body weight in 5 week(s).

## 2014-12-09 ENCOUNTER — Other Ambulatory Visit: Payer: Self-pay | Admitting: Family Medicine

## 2014-12-19 ENCOUNTER — Ambulatory Visit: Payer: Medicaid Other | Admitting: Family Medicine

## 2015-02-10 ENCOUNTER — Other Ambulatory Visit: Payer: Self-pay | Admitting: Nephrology

## 2015-02-10 DIAGNOSIS — N183 Chronic kidney disease, stage 3 unspecified: Secondary | ICD-10-CM

## 2015-02-19 ENCOUNTER — Ambulatory Visit
Admission: RE | Admit: 2015-02-19 | Discharge: 2015-02-19 | Disposition: A | Discharge status: Home / Self Care | Payer: Medicaid Other | Source: Ambulatory Visit | Attending: Nephrology | Admitting: Nephrology

## 2015-02-19 DIAGNOSIS — N183 Chronic kidney disease, stage 3 unspecified: Secondary | ICD-10-CM

## 2015-03-24 ENCOUNTER — Other Ambulatory Visit: Payer: Self-pay | Admitting: Family Medicine

## 2015-08-16 ENCOUNTER — Encounter (HOSPITAL_COMMUNITY): Payer: Self-pay

## 2015-08-16 ENCOUNTER — Emergency Department (HOSPITAL_COMMUNITY)
Admission: EM | Admit: 2015-08-16 | Discharge: 2015-08-16 | Disposition: A | Discharge status: Home / Self Care | Payer: Medicaid Other | Attending: Emergency Medicine | Admitting: Emergency Medicine

## 2015-08-16 DIAGNOSIS — I10 Essential (primary) hypertension: Secondary | ICD-10-CM | POA: Diagnosis not present

## 2015-08-16 DIAGNOSIS — L03116 Cellulitis of left lower limb: Secondary | ICD-10-CM | POA: Insufficient documentation

## 2015-08-16 DIAGNOSIS — M79605 Pain in left leg: Secondary | ICD-10-CM | POA: Diagnosis present

## 2015-08-16 DIAGNOSIS — Z7982 Long term (current) use of aspirin: Secondary | ICD-10-CM | POA: Insufficient documentation

## 2015-08-16 LAB — CBC WITH DIFFERENTIAL/PLATELET
BASOS PCT: 0 %
Basophils Absolute: 0 10*3/uL (ref 0.0–0.1)
Eosinophils Absolute: 0.1 10*3/uL (ref 0.0–0.7)
Eosinophils Relative: 1 %
HEMATOCRIT: 33 % — AB (ref 39.0–52.0)
Hemoglobin: 11.1 g/dL — ABNORMAL LOW (ref 13.0–17.0)
LYMPHS PCT: 9 %
Lymphs Abs: 0.9 10*3/uL (ref 0.7–4.0)
MCH: 28.5 pg (ref 26.0–34.0)
MCHC: 33.6 g/dL (ref 30.0–36.0)
MCV: 84.8 fL (ref 78.0–100.0)
Monocytes Absolute: 0.9 10*3/uL (ref 0.1–1.0)
Monocytes Relative: 9 %
NEUTROS ABS: 8.4 10*3/uL — AB (ref 1.7–7.7)
Neutrophils Relative %: 81 %
PLATELETS: 264 10*3/uL (ref 150–400)
RBC: 3.89 MIL/uL — AB (ref 4.22–5.81)
RDW: 14.4 % (ref 11.5–15.5)
WBC: 10.3 10*3/uL (ref 4.0–10.5)

## 2015-08-16 LAB — BASIC METABOLIC PANEL
ANION GAP: 8 (ref 5–15)
BUN: 64 mg/dL — ABNORMAL HIGH (ref 6–20)
CALCIUM: 8.7 mg/dL — AB (ref 8.9–10.3)
CO2: 17 mmol/L — ABNORMAL LOW (ref 22–32)
Chloride: 108 mmol/L (ref 101–111)
Creatinine, Ser: 3.75 mg/dL — ABNORMAL HIGH (ref 0.61–1.24)
GFR, EST AFRICAN AMERICAN: 20 mL/min — AB (ref >60)
GFR, EST NON AFRICAN AMERICAN: 17 mL/min — AB (ref >60)
GLUCOSE: 123 mg/dL — AB (ref 65–99)
POTASSIUM: 4.6 mmol/L (ref 3.5–5.1)
Sodium: 133 mmol/L — ABNORMAL LOW (ref 135–145)

## 2015-08-16 MED ORDER — CEFAZOLIN IN D5W 1 GM/50ML IV SOLN
1.0000 g | Freq: Once | INTRAVENOUS | Status: AC
  Administered 2015-08-16: 1 g via INTRAVENOUS
  Filled 2015-08-16: qty 50

## 2015-08-16 MED ORDER — CEPHALEXIN 500 MG PO CAPS: 500.0000 mg | ORAL_CAPSULE | Freq: Four times a day (QID) | ORAL | Status: DC

## 2015-08-16 MED ORDER — SULFAMETHOXAZOLE-TRIMETHOPRIM 800-160 MG PO TABS: 1.0000 | ORAL_TABLET | Freq: Two times a day (BID) | ORAL | Status: DC

## 2015-08-16 MED ORDER — LIDOCAINE HCL 2 % IJ SOLN: 10.0000 mL | Freq: Once | INTRAMUSCULAR | Status: DC

## 2015-08-16 NOTE — ED Notes (Signed)
Patient here with bilateral upper leg pain and swelling with weeping x 2 days. denies trauma. Pain worse with ambulation

## 2015-08-16 NOTE — ED Notes (Signed)
Pt has pendulous extra skin in inner thigh area bilaterally; they look reddened and are weeping.

## 2015-08-16 NOTE — ED Notes (Signed)
Pt states legs started weeping 2 days ago; chills starting June 20th. States he occasionally thinks he is running fevers and takes tylenol. He is on weight loss regime 5 day exercising walking 2 miles/day. States he used to weigh close 400 pounds.

## 2015-08-16 NOTE — Discharge Instructions (Signed)
It was my pleasure taking care of you today!  Please take all of your antibiotics until finished!  Return to ED tomorrow for a wound check to ensure everything is healing properly.  You have an appointment scheduled with family practice for Monday the 26th at 8:30am. Please keep this scheduled appointment!

## 2015-08-16 NOTE — ED Provider Notes (Signed)
CSN: [id number]     Arrival date & time 08/16/15  [id number] History   First MD Initiated Contact with Patient 08/16/15 1002     Chief Complaint  Patient presents with  . Leg Pain    (Consider location/radiation/quality/duration/timing/severity/associated sxs/prior Treatment) Patient is a 55 y.o. male presenting with leg pain. The history is provided by the patient and medical records. No language interpreter was used.  Leg Pain Associated symptoms: fever     Johnny [name] is a 55 y.o. male  who presents to the Emergency Department complaining of worsening left upper leg pain that began two days ago. Patient states he noticed associated clear fluid leaking out of his left leg as well. He states his right thigh will occasionally hurt, but less than left and does not have any fluid leaking. Admits to intermittent low grade fevers tmax 100 over the last week. Of note, patient states he has been trying to lose weight and has recently become very serious about his health care. He has started walking 2 miles every weekday morning. He has noticed irritation over the last few weeks between his thighs, but did not think much of it at the time. He has also been sitting in the whirlpool and steam room at the gym after his walks. Denies chills, abdominal pain, genital pain or any other associated symptoms. States he feels well other than his thighs bothering him.    Past Medical History  Diagnosis Date  . 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 Hypertension   . Essential HTN 04/21/2006  . Diverticulosis of large intestine without hemorrhage 01/31/2012  . OSA 05/21/2013   Past Surgical History  Procedure Laterality Date  . No past surgeries    . Colonoscopy  01/31/2012    Procedure: COLONOSCOPY;  Surgeon: Inda Castle, MD;  Location: [facility];  Service: Endoscopy;  Laterality: N/A;  bmi is 37   Family History  Problem Relation Age of Onset  . Breast cancer Mother   . Colon cancer Father     died at 66  .  Hypertension Brother   . Hypertension Mother    Social History  Substance Use Topics  . Smoking status: Never Smoker   . Smokeless tobacco: Never Used  . Alcohol Use: Yes     Comment: social-once a week    Review of Systems  Constitutional: Positive for fever. Negative for chills.  HENT: Negative for congestion.   Eyes: Negative for visual disturbance.  Respiratory: Negative for cough and shortness of breath.   Cardiovascular: Negative.   Gastrointestinal: Negative for nausea, vomiting and abdominal pain.  Genitourinary: Negative for dysuria, penile swelling, scrotal swelling and testicular pain.  Musculoskeletal: Positive for myalgias.  Skin: Positive for color change.  Neurological: Negative for headaches.      Allergies  Lisinopril  Home Medications   Prior to Admission medications   Medication Sig Start Date End Date Taking? Authorizing Provider  aspirin 81 MG chewable tablet Chew 81 mg by mouth daily.     Yes Historical Provider, MD  carvedilol (COREG) 12.5 MG tablet TAKE ONE TABLET BY MOUTH TWICE DAILY WITH MEALS 12/10/14  Yes Patrecia Pour, MD  hydrochlorothiazide (HYDRODIURIL) 25 MG tablet TAKE ONE TABLET BY MOUTH  DAILY 03/24/15  Yes Patrecia Pour, MD  RAYALDEE 30 MCG CPCR Take 30 mcg by mouth daily. 08/14/15  Yes Historical Provider, MD  cephALEXin (KEFLEX) 500 MG capsule Take 1 capsule (500 mg total) by mouth 4 (four) times daily. 08/16/15  [facility] Armend Hochstatter, PA-C  sulfamethoxazole-trimethoprim (BACTRIM DS,SEPTRA DS) 800-160 MG tablet Take 1 tablet by mouth 2 (two) times daily. 08/16/15 08/23/15  Nataleigh Griffin Pilcher Ticia Virgo, PA-C   BP 120/84 mmHg  Pulse 78  Temp(Src) 98.3 F (36.8 C) (Oral)  Resp 18  Ht 5\' 9"  (1.753 m)  Wt 158.759 kg  BMI 51.66 kg/m2  SpO2 98% Physical Exam  Constitutional: He is oriented to person, place, and time.  WDWN pleasant obese male in NAD.   HENT:  Head: Normocephalic and atraumatic.  Cardiovascular: Normal rate, regular rhythm, normal  heart sounds and intact distal pulses.   Pulmonary/Chest: Effort normal and breath sounds normal. No respiratory distress.  Abdominal: Soft. He exhibits no distension. There is no tenderness.  Musculoskeletal: Normal range of motion.  Neurological: He is alert and oriented to person, place, and time.  Skin:  Left upper inner thigh indurated and warm to the touch with clear fluid weeping through skin. Mild erythema.  Right upper inner thigh mildly indurated but no warm or erythema and skin is clean and dry.   Nursing note and vitals reviewed.   ED Course  Procedures (including critical care time) Labs Review Labs Reviewed  CBC WITH DIFFERENTIAL/PLATELET - Abnormal; Notable for the following:    RBC 3.89 (*)    Hemoglobin 11.1 (*)    HCT 33.0 (*)    Neutro Abs 8.4 (*)    All other components within normal limits  BASIC METABOLIC PANEL - Abnormal; Notable for the following:    Sodium 133 (*)    CO2 17 (*)    Glucose, Bld 123 (*)    BUN 64 (*)    Creatinine, Ser 3.75 (*)    Calcium 8.7 (*)    GFR calc non Af Amer 17 (*)    GFR calc Af Amer 20 (*)    All other components within normal limits    Imaging Review No results found. I have personally reviewed and evaluated these images and lab results as part of my medical decision-making.   EKG Interpretation None      MDM   Final diagnoses:  Cellulitis of left lower extremity   Johnny [name] presents to ED for bilateral upper thigh pain L>R x 2 days with intermittent low grade fevers x 1 week. On exam, afebrile. Left upper thigh appears cellulitic with no underlying abscess. Right thigh is slightly indurated, however no warmth/erythema/drainage. Will check basic labs and give dose of ABX in ED. Labs reviewed white count wdl - ab neutro 8.4. BMP with worsening of baseline kidney function. Patient states he is followed by nephrology, Dr. Posey Pronto, whom he saw two days ago and informed him of his worsening kidney function. He is a  patient of family practice who was consulted. He has a follow up appointment with them Monday morning at 8:30. Also encouraged patient to return to ED tomorrow for a wound check to ensure that everything is healing as expected and not getting worse. Will treat with bactrim and keflex. Strict return precautions were again discussed. Patient agrees to come to ED tomorrow for wound check and keep scheduled outpatient appointment Monday. Home care instructions were discussed and all questions answered.   Patient discussed with Dr. Audie Pinto who agrees with treatment plan.  [facility] Ceci Taliaferro, PA-C 08/16/15 1146  Leonard Schwartz, MD 08/17/15 [phone]

## 2015-08-16 NOTE — ED Notes (Signed)
PT ambulated with baseline gait; VSS; A&Ox3; no signs of distress; respirations even and unlabored; skin warm and dry; no questions upon discharge.  

## 2015-08-17 ENCOUNTER — Encounter (HOSPITAL_COMMUNITY): Payer: Self-pay

## 2015-08-17 ENCOUNTER — Emergency Department (HOSPITAL_COMMUNITY)
Admission: EM | Admit: 2015-08-17 | Discharge: 2015-08-17 | Disposition: A | Discharge status: Home / Self Care | Payer: Medicaid Other | Attending: Emergency Medicine | Admitting: Emergency Medicine

## 2015-08-17 DIAGNOSIS — Z5189 Encounter for other specified aftercare: Secondary | ICD-10-CM

## 2015-08-17 DIAGNOSIS — Z7982 Long term (current) use of aspirin: Secondary | ICD-10-CM | POA: Insufficient documentation

## 2015-08-17 DIAGNOSIS — I10 Essential (primary) hypertension: Secondary | ICD-10-CM | POA: Diagnosis not present

## 2015-08-17 DIAGNOSIS — Z4800 Encounter for change or removal of nonsurgical wound dressing: Secondary | ICD-10-CM | POA: Diagnosis not present

## 2015-08-17 LAB — CBC
HCT: 32.7 % — ABNORMAL LOW (ref 39.0–52.0)
HEMOGLOBIN: 10.9 g/dL — AB (ref 13.0–17.0)
MCH: 28.4 pg (ref 26.0–34.0)
MCHC: 33.3 g/dL (ref 30.0–36.0)
MCV: 85.2 fL (ref 78.0–100.0)
Platelets: 309 10*3/uL (ref 150–400)
RBC: 3.84 MIL/uL — ABNORMAL LOW (ref 4.22–5.81)
RDW: 14.2 % (ref 11.5–15.5)
WBC: 11.7 10*3/uL — ABNORMAL HIGH (ref 4.0–10.5)

## 2015-08-17 NOTE — ED Notes (Signed)
Declined W/C at D/C and was escorted to lobby by RN. 

## 2015-08-17 NOTE — ED Provider Notes (Signed)
CSN: [id number]     Arrival date & time 08/17/15  1256 History  By signing my name below, I, Eustaquio Maize, attest that this documentation has been prepared under the direction and in the presence of Shary Decamp, PA-C.  Electronically Signed: Eustaquio Maize, ED Scribe. 08/17/2015. 1:34 PM.   No chief complaint on file.  The history is provided by the patient. No language interpreter was used.    HPI Comments: Johnny [name] is a 55 y.o. male who presents to the Emergency Department for wound check. Pt was seen yesterday for left leg pain and was diagnosed with cellulitis. He was told to return today for a wound check. Pt has not looked at the area today and cannot say if it is any better or worse than yesterday. He does complain of 7/10 pain to the area. Denies fever, chest pain, shortness of breath, nausea, vomiting, diarrhea or any other associated symptoms.   Per chart review: Pt was seen in the ED 1 day ago for worsening left upper leg pain for the past 2 days with intermittent low grade fevers x 1 week and associated clear fluid leaking from leg. Pt had basic labs checked and was given a dose of antibiotics in the ED. Pt was discharged with diagnoses of cellulitis of the left lower extremity and given prescription for Bactrim and Keflex. He was told to return in 1 day for wound check. Pt also has a follow up appointment on Monday with family practice for his leg.    Past Medical History  Diagnosis Date  . 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 Hypertension   . Essential HTN 04/21/2006  . Diverticulosis of large intestine without hemorrhage 01/31/2012  . OSA 05/21/2013   Past Surgical History  Procedure Laterality Date  . No past surgeries    . Colonoscopy  01/31/2012    Procedure: COLONOSCOPY;  Surgeon: Inda Castle, MD;  Location: [facility];  Service: Endoscopy;  Laterality: N/A;  bmi is 6   Family History  Problem Relation Age of Onset  . Breast cancer Mother   . Colon cancer Father      died at 35  . Hypertension Brother   . Hypertension Mother    Social History  Substance Use Topics  . Smoking status: Never Smoker   . Smokeless tobacco: Never Used  . Alcohol Use: Yes     Comment: social-once a week    Review of Systems  Constitutional: Negative for fever and chills.  Respiratory: Negative for shortness of breath.   Cardiovascular: Negative for chest pain.  Gastrointestinal: Negative for nausea, vomiting and diarrhea.  Musculoskeletal: Positive for arthralgias.  Skin: Positive for wound.   Allergies  Lisinopril  Home Medications   Prior to Admission medications   Medication Sig Start Date End Date Taking? Authorizing Provider  aspirin 81 MG chewable tablet Chew 81 mg by mouth daily.      Historical Provider, MD  carvedilol (COREG) 12.5 MG tablet TAKE ONE TABLET BY MOUTH TWICE DAILY WITH MEALS 12/10/14   Patrecia Pour, MD  cephALEXin (KEFLEX) 500 MG capsule Take 1 capsule (500 mg total) by mouth 4 (four) times daily. 08/16/15   Ozella Almond Ward, PA-C  hydrochlorothiazide (HYDRODIURIL) 25 MG tablet TAKE ONE TABLET BY MOUTH  DAILY 03/24/15   Patrecia Pour, MD  RAYALDEE 30 MCG CPCR Take 30 mcg by mouth daily. 08/14/15   Historical Provider, MD  sulfamethoxazole-trimethoprim (BACTRIM DS,SEPTRA DS) 800-160 MG tablet Take 1 tablet by  mouth 2 (two) times daily. 08/16/15 08/23/15  Jaime Pilcher Ward, PA-C   BP 132/92 mmHg  Pulse 77  Temp(Src) 99.2 F (37.3 C) (Oral)  Resp 20  SpO2 99%   Physical Exam  Constitutional: He is oriented to person, place, and time. He appears well-developed and well-nourished. No distress.  Obese male in NAD  HENT:  Head: Normocephalic and atraumatic.  Eyes: Conjunctivae and EOM are normal.  Neck: Neck supple. No tracheal deviation present.  Cardiovascular: Normal rate.   Pulmonary/Chest: Effort normal. No respiratory distress.  Musculoskeletal: Normal range of motion.  Left upper inner thigh induratedwith clear fluid weeping  through skin. Non erythematous.  Right upper inner thigh mild induration but no warm or erythema and skin is clean and dry. Neurovascularly intact  Neurological: He is alert and oriented to person, place, and time.  Skin: Skin is warm and dry.  Psychiatric: He has a normal mood and affect. His behavior is normal.  Nursing note and vitals reviewed.   ED Course  Procedures (including critical care time)  DIAGNOSTIC STUDIES: Oxygen Saturation is 99% on RA, normal by my interpretation.    COORDINATION OF CARE: 1:34 PM-Discussed treatment plan with pt at bedside and pt agreed to plan.   Labs Review Labs Reviewed  CBC - Abnormal; Notable for the following:    WBC 11.7 (*)    RBC 3.84 (*)    Hemoglobin 10.9 (*)    HCT 32.7 (*)    All other components within normal limits  AEROBIC CULTURE (SUPERFICIAL SPECIMEN)   Imaging Review No results found. I have personally reviewed and evaluated these images and lab results as part of my medical decision-making.   EKG Interpretation None      MDM  I have reviewed and evaluated the relevant laboratory values I have reviewed the relevant previous healthcare records. I obtained HPI from historian.  ED Course:  Assessment: Patient returns for check of cellulitis of left lower extremity. The region appears to be well-healing and infection appears to be resolving. Patient symptoms improved from prior visit. Afebrile and hemodynamically stable. Supervising physician has seen an evaluated patient. Wound cultures obtained. Bedside ultrasound showed no abscess formation amenable for I and D. Pt is instructed to continue with home care or antibiotics. Pt scheduled to see PCP tomorrow morning. Pt has a good understanding of return precautions and is safe for discharge at this time.  Disposition/Plan:  DC Home Additional Verbal discharge instructions given and discussed with patient.  Pt Instructed to f/u with PCP in the next week for evaluation and  treatment of symptoms. Return precautions given Pt acknowledges and agrees with plan  Supervising Physician Quintella Reichert, MD   Final diagnoses:  Encounter for wound re-check     I personally performed the services described in this documentation, which was scribed in my presence. The recorded information has been reviewed and is accurate.      Shary Decamp, PA-C 08/17/15 1457  Quintella Reichert, MD 08/23/15 1131

## 2015-08-17 NOTE — Discharge Instructions (Signed)
Please read and follow all provided instructions.  Your diagnoses today include:  1. Encounter for wound re-check     Tests performed today include:  Vital signs. See below for your results today.   Medications prescribed:   Keep taking antibiotics as prescribed   Home care instructions:  Follow any educational materials contained in this packet.  Follow-up instructions: Please follow-up with your primary care provider tomorrow for further evaluation of symptoms and treatment   Return instructions:   Please return to the Emergency Department if you do not get better, if you get worse, or new symptoms OR  - Fever (temperature greater than 101.41F)  - Bleeding that does not stop with holding pressure to the area    -Severe pain (please note that you may be more sore the day after your accident)  - Chest Pain  - Difficulty breathing  - Severe nausea or vomiting  - Inability to tolerate food and liquids  - Passing out  - Skin becoming red around your wounds  - Change in mental status (confusion or lethargy)  - New numbness or weakness     Please return if you have any other emergent concerns.  Additional Information:  Your vital signs today were: BP 132/92 mmHg   Pulse 77   Temp(Src) 99.2 F (37.3 C) (Oral)   Resp 20   SpO2 99% If your blood pressure (BP) was elevated above 135/85 this visit, please have this repeated by your doctor within one month. ---------------

## 2015-08-17 NOTE — ED Notes (Signed)
Patient here for recheck as scheduled for left leg cellulitis, taking antibiotic

## 2015-08-18 ENCOUNTER — Ambulatory Visit (INDEPENDENT_AMBULATORY_CARE_PROVIDER_SITE_OTHER): Payer: Medicaid Other | Admitting: Family Medicine

## 2015-08-18 VITALS — BP 125/78 | HR 76 | Temp 98.2°F | Ht 68.0 in | Wt 346.2 lb

## 2015-08-18 DIAGNOSIS — L03116 Cellulitis of left lower limb: Secondary | ICD-10-CM | POA: Diagnosis not present

## 2015-08-18 NOTE — Patient Instructions (Addendum)
Continue taking your antibiotics as prescribed.  You should plan to follow up with your PCP in about 10 days for reassessment of your thigh if your symptoms have not completely resolved.  You may also consider discussing skin surgery (to remove excess skin after significant weight loss). Today, the area on your thigh is looking good.  Cellulitis Cellulitis is an infection of the skin and the tissue under the skin. The infected area is usually red and tender. This happens most often in the arms and lower legs. HOME CARE   Take your antibiotic medicine as told. Finish the medicine even if you start to feel better.  Keep the infected arm or leg raised (elevated).  Put a warm cloth on the area up to 4 times per day.  Only take medicines as told by your doctor.  Keep all doctor visits as told. GET HELP IF:  You see red streaks on the skin coming from the infected area.  Your red area gets bigger or turns a dark color.  Your bone or joint under the infected area is painful after the skin heals.  Your infection comes back in the same area or different area.  You have a puffy (swollen) bump in the infected area.  You have new symptoms.  You have a fever. GET HELP RIGHT AWAY IF:   You feel very sleepy.  You throw up (vomit) or have watery poop (diarrhea).  You feel sick and have muscle aches and pains.   This information is not intended to replace advice given to you by your health care provider. Make sure you discuss any questions you have with your health care provider.   Document Released: 07/28/2007 Document Revised: 10/30/2014 Document Reviewed: 04/26/2011 Elsevier Interactive Patient Education [employer].

## 2015-08-18 NOTE — Progress Notes (Signed)
   Subjective: CC: f/u ED visit [id number] Johnny [name] is a 55 y.o. male presenting to clinic today for same day appointment. PCP: Vance Gather, MD/ [place] Concerns today include:  1. Cellulitis Patient seen in ED on 6/24 and 6/25 for LLE cellulitis.  He was started on Keflex and Septra.  He followed up the next day for wound check.  The affected area appeared to improve.  Patient notes that he has been taking abx without difficulty.  Denies Nausea, vomiting, fevers, chills.  Notes that area continues to be sore.  He takes Extra strength Tylenol, which takes the edge off.  He notes that the area is no longer weeping clear fluid.  Redness has also improved.  MedHx reviewed.  Health Maintenance: HIV, Hep C screening due  ROS: Per HPI  Objective: Office vital signs reviewed. BP 125/78 mmHg  Pulse 76  Temp(Src) 98.2 F (36.8 C) (Oral)  Ht 5\' 8"  (1.727 m)  Wt 346 lb 3.2 oz (157.035 kg)  BMI 52.65 kg/m2  Physical Examination:  General: Awake, alert, obese male, No acute distress MSK: slow gait and wide station; bilateral posterior thighs with large amounts of excess skin Skin: minimal weeping from affected area of posterior left thigh at areas of skin breakdown, moderately large area of induration but no underlying fluctuance, no erythema, non tender to palpation    Assessment/ Plan: 55 y.o. male   1. Cellulitis of left upper extremity.  Cellulitis seems to be improving.  Minimal weeping from affected area appreciated today.  No evidence of underlying abscess formation. - Discussed with patient that he should continue Keflex and Septra DS as directed to finish 08/23/15.   - Continue Tylenol 500mg  q6 prn pain. - Recommended returning for reevaluation once abx completed/ in the next week.   - Would consider discussing skin removal surgery, as patient notes that he has lost >50lbs. - Return precautions reviewed - Patient to follow up in 1 week with PCP.   Janora Norlander,  DO PGY-2, [place]

## 2015-08-20 ENCOUNTER — Telehealth: Payer: Self-pay | Admitting: Internal Medicine

## 2015-08-20 DIAGNOSIS — L03119 Cellulitis of unspecified part of limb: Secondary | ICD-10-CM

## 2015-08-20 LAB — AEROBIC CULTURE  (SUPERFICIAL SPECIMEN): SPECIAL REQUESTS: NORMAL

## 2015-08-20 LAB — AEROBIC CULTURE W GRAM STAIN (SUPERFICIAL SPECIMEN): Gram Stain: NONE SEEN

## 2015-08-20 MED ORDER — DOXYCYCLINE HYCLATE 100 MG PO TABS: 100.0000 mg | ORAL_TABLET | Freq: Two times a day (BID) | ORAL | Status: DC

## 2015-08-20 NOTE — Telephone Encounter (Signed)
Received call from Emergency Department provider [facility] who, when reviewing patient's encounter from 08/16/15, found that patient's SCr was 3.75, which is very elevated from his last measure of 2.56 on 09/05/14. He had been prescribed bactrim and keflex. She requested we call to inform him to stop the bactrim due to renal function. I will prescribe doxycycline 100 mg BID x 5 days. He has been made a follow-up appointment in clinic for 08/22/15 at 9:00 a.m. with Dr. Lamar Benes. Will recommend BMP check at that time. Patient says he follows with a nephrologist monthly and has an appointment next month.

## 2015-08-21 ENCOUNTER — Telehealth (HOSPITAL_BASED_OUTPATIENT_CLINIC_OR_DEPARTMENT_OTHER): Payer: Self-pay | Admitting: Emergency Medicine

## 2015-08-21 NOTE — Telephone Encounter (Signed)
Post ED Visit - Positive Culture Follow-up  Culture report reviewed by antimicrobial stewardship pharmacist:  []  Elenor Quinones, Pharm.D. []  Heide Guile, Pharm.D., BCPS []  Parks Neptune, Pharm.D. []  Alycia Rossetti, Pharm.D., BCPS []  [place], [place].D., BCPS, AAHIVP []  Legrand Como, Pharm.D., BCPS, AAHIVP [x]  Milus Glazier, Pharm.D. []  Rob Evette Doffing, Pharm.D.  Positive wound culture Treated with keflex, septra, doxycyline, organism sensitive to the same and no further patient follow-up is required at this time.  Hazle Nordmann 08/21/2015, 10:56 AM

## 2015-08-22 ENCOUNTER — Ambulatory Visit (INDEPENDENT_AMBULATORY_CARE_PROVIDER_SITE_OTHER): Payer: Medicaid Other | Admitting: Family Medicine

## 2015-08-22 ENCOUNTER — Encounter: Payer: Self-pay | Admitting: Family Medicine

## 2015-08-22 VITALS — BP 136/85 | HR 72 | Temp 98.3°F | Wt 338.0 lb

## 2015-08-22 DIAGNOSIS — L03116 Cellulitis of left lower limb: Secondary | ICD-10-CM | POA: Diagnosis not present

## 2015-08-22 DIAGNOSIS — N179 Acute kidney failure, unspecified: Secondary | ICD-10-CM | POA: Diagnosis not present

## 2015-08-22 DIAGNOSIS — N189 Chronic kidney disease, unspecified: Secondary | ICD-10-CM | POA: Diagnosis not present

## 2015-08-22 LAB — BASIC METABOLIC PANEL WITH GFR
BUN: 72 mg/dL — AB (ref 7–25)
CHLORIDE: 104 mmol/L (ref 98–110)
CO2: 14 mmol/L — ABNORMAL LOW (ref 20–31)
CREATININE: 3.96 mg/dL — AB (ref 0.70–1.33)
Calcium: 8.5 mg/dL — ABNORMAL LOW (ref 8.6–10.3)
GFR, EST AFRICAN AMERICAN: 19 mL/min — AB (ref >=60)
GFR, Est Non African American: 16 mL/min — ABNORMAL LOW (ref >=60)
GLUCOSE: 139 mg/dL — AB (ref 65–99)
POTASSIUM: 5.7 mmol/L — AB (ref 3.5–5.3)
Sodium: 128 mmol/L — ABNORMAL LOW (ref 135–146)

## 2015-08-22 NOTE — Patient Instructions (Addendum)
Finish out the Keflex. Pick up the doxycycline and take until it is gone.  Return immediately if developing fevers, severe pain, worsening.

## 2015-08-22 NOTE — Progress Notes (Signed)
   Subjective:    Patient ID: Johnny [name], male    DOB: 21-Apr-1960, 55 y.o.   MRN: [id number]  HPI  Patient presents for Same Day Appointment  CC: cellulitis  # Cellulitis:  Diagnosed 6/24 in ED.  Started on keflex and bactrim, he was called 3 days ago to stop bactrim but he ended up stopping all antibiotics and not picking up the doxycycline that was called in  He reports improvement in symptoms  Still having pain about 4/10  No drainage, no weeping, no pus drainage ROS: no nausea or vomiting, no diarrhea  Social Hx: never smoker  Review of Systems   See HPI for ROS.   Past medical history, surgical, family, and social history reviewed and updated in the EMR as appropriate.  Objective:  BP 136/85 mmHg  Pulse 72  Temp(Src) 98.3 F (36.8 C) (Oral)  Wt 338 lb (153.316 kg)  SpO2 97% Vitals and nursing note reviewed  General: no apparent distress  Skin: he has significant swelling posterior left upper leg, there is some induration and warmth, no fluctuance or drainage  Assessment & Plan:  1. Acute kidney injury superimposed on chronic kidney disease ([place]) Re-check after on bactrim which has been stopped. He also has appointment with nephrologist soon. - BASIC METABOLIC PANEL WITH GFR  2. Cellulitis of leg, left Showing improvement but stopped antibiotics short. Recommended restarting keflex for strep coverage and pick up doxycycline for MRSA coverage. Has follow up appointment next week, discussed if he has completely resolved can cancel this but should return if not fully resolved. Return precautions reviewed.   Return in about 6 days (around 08/28/2015), or if symptoms worsen or fail to improve.

## 2015-08-25 ENCOUNTER — Telehealth: Payer: Self-pay | Admitting: Family Medicine

## 2015-08-25 NOTE — Telephone Encounter (Signed)
Patient was contacted on cell phone today at 1230 in regards to abnormal lab results: Na 128, K 5.7. The patient was reached and reported feeling great, denied any symptoms of chest discomfort/palpitations, light-headedness, or shortness of breath. Patient is out of town in [facility] and was told to go to the ED if he becomes symptomatic. Patient will follow up with appoinmtent on Wednesday 08/27/15.  -- Harriet Butte DO PGY-1

## 2015-08-27 ENCOUNTER — Ambulatory Visit (INDEPENDENT_AMBULATORY_CARE_PROVIDER_SITE_OTHER): Payer: Medicaid Other | Admitting: Family Medicine

## 2015-08-27 ENCOUNTER — Encounter: Payer: Self-pay | Admitting: Family Medicine

## 2015-08-27 VITALS — BP 142/92 | HR 77 | Temp 97.6°F | Wt 334.0 lb

## 2015-08-27 DIAGNOSIS — E871 Hypo-osmolality and hyponatremia: Secondary | ICD-10-CM | POA: Diagnosis not present

## 2015-08-27 DIAGNOSIS — L03116 Cellulitis of left lower limb: Secondary | ICD-10-CM | POA: Diagnosis not present

## 2015-08-27 DIAGNOSIS — E875 Hyperkalemia: Secondary | ICD-10-CM | POA: Insufficient documentation

## 2015-08-27 NOTE — Assessment & Plan Note (Addendum)
Lab drawn 6/30 showed K 5.7. Asymptomatic at present. - CKD stage III, may be AKI secondary to bactrim use, was d/c. - No obvious offending medication at present. - Will recheck BMP today and f/u with results. - Will reschedule f/u visit if persistent elevation in level. - May consider drawing urinalysis if persistent hyperkalemia. - Patient will f/u with nephrologist as scheduled next month.

## 2015-08-27 NOTE — Assessment & Plan Note (Addendum)
Reason for visit. Improved. Non-erythematous, without warmth, non-purulent, and non-tender.  - Finish course of Doxycycline as instructed - Avoid excessive moisturizing of area and keep dry to avoid infection - If worsening of condition, patient is to call clinic for further evaluation

## 2015-08-27 NOTE — Progress Notes (Addendum)
Subjective:     Patient ID: Johnny [name], male   DOB: 03/08/1960, 55 y.o.   MRN: [id number]  HPI Patient is a 55 y.o. AA male with CKD stage III and vascular insufficiency who presents to the office for follow up relating to cellulitis on proximal left leg currently on doxy and compliant. There are abnormal electrolyte levels with Na 128 and K 5.7 (6/30). Patient says leg has greatly improved and denies fevers, chills, worsening of swelling or erythema since seen last week.   Smoking status: Never smoked.  Review of Systems See HPI for ROS.  Past medical, surgical, and family history reviewed and updated in EMR as appropriate.    Objective:   Physical Exam  Constitutional: He is oriented to person, place, and time. He appears well-developed and well-nourished. No distress.  HENT:  Head: Normocephalic and atraumatic.  Cardiovascular: Normal rate, regular rhythm, normal heart sounds and intact distal pulses.  Exam reveals no gallop and no friction rub.   No murmur heard. Pulmonary/Chest: Effort normal and breath sounds normal. No respiratory distress. He has no wheezes. He has no rales. He exhibits no tenderness.  Musculoskeletal: Normal range of motion.  Neurological: He is alert and oriented to person, place, and time.  Skin: He is not diaphoretic.  Area of concern at proximal left leg. Obvious chronic edema from venous insufficiency. No erythema, weeping pustules noted. Area is not warm or tender upon palpation. There is a scab from site of infection. No edema or crepitus noted.       Assessment & Plan:     Cellulitis of left lower extremity without foot Reason for visit. Improved. Non-erythematous, without warmth, non-purulent, and non-tender.  - Finish course of Doxycycline as instructed - Avoid excessive moisturizing of area and keep dry to avoid infection - If worsening of condition, patient is to call clinic for further evaluation  Hyponatremia Lab drawn 6/30 showed Na 128.  Asymptomatic at present. - Will recheck BMP today to re-evaluate. - May be secondary to offending antibiotic (Bactrim) which was discontinued due to CKD. - Chronic use of Coreg 12.5 but no h/o persistent hyponatremia. - If persistent hyponatremia, will contact patient to schedule follow up visit for further evaluation. - May consider a urinalysis if hyponatremia persists.  Hyperkalemia Lab drawn 6/30 showed K 5.7. Asymptomatic at present. - CKD stage III, may be AKI secondary to bactrim use, was d/c. - No obvious offending medication at present. - Will recheck BMP today and f/u with results. - Will reschedule f/u visit if persistent elevation in level. - May consider drawing urinalysis if persistent hyperkalemia. - Patient will f/u with nephrologist as scheduled next month.

## 2015-08-27 NOTE — Assessment & Plan Note (Addendum)
Lab drawn 6/30 showed Na 128. Asymptomatic at present. - Will recheck BMP today to re-evaluate. - May be secondary to offending antibiotic (Bactrim) which was discontinued due to CKD. - Chronic use of HCTZ but no h/o persistent hyponatremia. - If persistent hyponatremia, will contact patient to schedule follow up visit for further evaluation. - May consider a urinalysis if hyponatremia persists.

## 2015-08-27 NOTE — Patient Instructions (Signed)
It was a pleasure to meet you Johnny [name]. I am glad your leg is looking much better. Please finish the doxycycline antibiotic as instructed. If you leg begins to worsen or become painful, please contact the clinic. The office will get in touch with you tomorrow with the results of your blood electrolyte levels.  -- Dr. Yisroel Ramming DO

## 2015-08-28 ENCOUNTER — Telehealth: Payer: Self-pay | Admitting: *Deleted

## 2015-08-28 ENCOUNTER — Telehealth: Payer: Self-pay | Admitting: Family Medicine

## 2015-08-28 DIAGNOSIS — E875 Hyperkalemia: Secondary | ICD-10-CM

## 2015-08-28 LAB — BASIC METABOLIC PANEL WITH GFR
BUN: 60 mg/dL — ABNORMAL HIGH (ref 7–25)
CHLORIDE: 106 mmol/L (ref 98–110)
CO2: 17 mmol/L — AB (ref 20–31)
Calcium: 8.6 mg/dL (ref 8.6–10.3)
Creat: 3.18 mg/dL — ABNORMAL HIGH (ref 0.70–1.33)
GFR, EST NON AFRICAN AMERICAN: 21 mL/min — AB (ref >=60)
GFR, Est African American: 24 mL/min — ABNORMAL LOW (ref >=60)
GLUCOSE: 95 mg/dL (ref 65–99)
Potassium: 6.3 mmol/L (ref 3.5–5.3)
SODIUM: 132 mmol/L — AB (ref 135–146)

## 2015-08-28 MED ORDER — FUROSEMIDE 40 MG PO TABS: 40.0000 mg | ORAL_TABLET | Freq: Two times a day (BID) | ORAL | Status: DC

## 2015-08-28 MED ORDER — SODIUM POLYSTYRENE SULFONATE PO POWD: Freq: Two times a day (BID) | ORAL | Status: DC

## 2015-08-28 NOTE — Telephone Encounter (Signed)
Precepting today.  Received report of K+ 6.3.  Contacted patient.  Plan to start lasix po bid and kayexalate po bid x today and tomorrow AM.  Patient scheduled to come in for lab recheck tomorrow.  Will place these orders as STAT since this will be a Friday.

## 2015-08-28 NOTE — Telephone Encounter (Signed)
[facility] Pharmacist called needing clarification on the sodium polystyrene (KAYEXALATE) powder Rx.  They do not carry the powder form only liquid and normal dosage is 30 grams not mg.  Verbal given by Dr. Mingo Amber to change to Kayexalate 30 grams BID liquid.  Derl Barrow, RN

## 2015-08-29 ENCOUNTER — Other Ambulatory Visit: Payer: Medicaid Other

## 2015-08-29 DIAGNOSIS — E875 Hyperkalemia: Secondary | ICD-10-CM

## 2015-08-29 LAB — BASIC METABOLIC PANEL
BUN: 55 mg/dL — ABNORMAL HIGH (ref 7–25)
CHLORIDE: 109 mmol/L (ref 98–110)
CO2: 18 mmol/L — ABNORMAL LOW (ref 20–31)
Calcium: 8.4 mg/dL — ABNORMAL LOW (ref 8.6–10.3)
Creat: 3.5 mg/dL — ABNORMAL HIGH (ref 0.70–1.33)
Glucose, Bld: 115 mg/dL — ABNORMAL HIGH (ref 65–99)
POTASSIUM: 4.7 mmol/L (ref 3.5–5.3)
Sodium: 135 mmol/L (ref 135–146)

## 2015-09-01 ENCOUNTER — Telehealth: Payer: Self-pay | Admitting: Family Medicine

## 2015-09-01 DIAGNOSIS — E875 Hyperkalemia: Secondary | ICD-10-CM

## 2015-09-01 NOTE — Telephone Encounter (Signed)
I will have nursing schedule f/u on 7/13 for recheck of potassium level.

## 2015-09-01 NOTE — Telephone Encounter (Signed)
Note to nursing staff:  Please call patient to schedule lab draw 7/13 to re-evaluate hyperkalemia.

## 2015-09-01 NOTE — Telephone Encounter (Signed)
Patient scheduled for lab visit at 10am on 7/13.

## 2015-09-04 ENCOUNTER — Other Ambulatory Visit: Payer: Medicaid Other

## 2015-09-04 DIAGNOSIS — E875 Hyperkalemia: Secondary | ICD-10-CM

## 2015-09-05 LAB — BASIC METABOLIC PANEL WITH GFR
BUN: 49 mg/dL — ABNORMAL HIGH (ref 7–25)
CO2: 20 mmol/L (ref 20–31)
Calcium: 8.2 mg/dL — ABNORMAL LOW (ref 8.6–10.3)
Chloride: 106 mmol/L (ref 98–110)
Creat: 2.94 mg/dL — ABNORMAL HIGH (ref 0.70–1.33)
GFR, EST AFRICAN AMERICAN: 27 mL/min — AB (ref >=60)
GFR, EST NON AFRICAN AMERICAN: 23 mL/min — AB (ref >=60)
GLUCOSE: 107 mg/dL — AB (ref 65–99)
POTASSIUM: 4 mmol/L (ref 3.5–5.3)
Sodium: 137 mmol/L (ref 135–146)

## 2015-09-09 ENCOUNTER — Telehealth: Payer: Self-pay | Admitting: Student in an Organized Health Care Education/Training Program

## 2015-09-09 ENCOUNTER — Other Ambulatory Visit: Payer: Self-pay | Admitting: Student in an Organized Health Care Education/Training Program

## 2015-09-09 ENCOUNTER — Other Ambulatory Visit: Payer: Self-pay | Admitting: Family Medicine

## 2015-09-09 MED ORDER — FUROSEMIDE 40 MG PO TABS: 40.0000 mg | ORAL_TABLET | Freq: Every day | ORAL | Status: DC

## 2015-09-09 NOTE — Telephone Encounter (Signed)
I have successfully filled the patient's prescription for Lasix 40mg . This medication prescription should be waiting for them at their listed pharmacy.   Please call to inform them of this refill and to schedule a visit to be seen by any provider within the next week.  Prescription changes: Decrease the medication once daily.  Thank you!

## 2015-09-09 NOTE — Telephone Encounter (Signed)
Called however patient was not available.   I refilled his Lasix, however this may not be a long term medication for him. He should take this medication only once per day as his potassium is now back within normal range, and he should schedule a follow up appointment to be seen within the next week.   Case was discussed with the attending.   Everrett Coombe, MD

## 2015-09-29 ENCOUNTER — Other Ambulatory Visit: Payer: Self-pay | Admitting: *Deleted

## 2015-09-30 MED ORDER — FUROSEMIDE 40 MG PO TABS: 40.0000 mg | ORAL_TABLET | Freq: Every day | ORAL | 0 refills | Status: DC

## 2015-10-05 ENCOUNTER — Emergency Department (HOSPITAL_COMMUNITY)
Admission: EM | Admit: 2015-10-05 | Discharge: 2015-10-05 | Disposition: A | Discharge status: Home / Self Care | Payer: Medicaid Other | Attending: Emergency Medicine | Admitting: Emergency Medicine

## 2015-10-05 ENCOUNTER — Encounter (HOSPITAL_COMMUNITY): Payer: Self-pay | Admitting: *Deleted

## 2015-10-05 ENCOUNTER — Emergency Department (HOSPITAL_COMMUNITY): Payer: Medicaid Other

## 2015-10-05 DIAGNOSIS — E669 Obesity, unspecified: Secondary | ICD-10-CM | POA: Diagnosis not present

## 2015-10-05 DIAGNOSIS — N390 Urinary tract infection, site not specified: Secondary | ICD-10-CM | POA: Insufficient documentation

## 2015-10-05 DIAGNOSIS — Z7982 Long term (current) use of aspirin: Secondary | ICD-10-CM | POA: Insufficient documentation

## 2015-10-05 DIAGNOSIS — I129 Hypertensive chronic kidney disease with stage 1 through stage 4 chronic kidney disease, or unspecified chronic kidney disease: Secondary | ICD-10-CM | POA: Insufficient documentation

## 2015-10-05 DIAGNOSIS — Z6841 Body Mass Index (BMI) 40.0 and over, adult: Secondary | ICD-10-CM | POA: Diagnosis not present

## 2015-10-05 DIAGNOSIS — N183 Chronic kidney disease, stage 3 (moderate): Secondary | ICD-10-CM | POA: Diagnosis not present

## 2015-10-05 DIAGNOSIS — R350 Frequency of micturition: Secondary | ICD-10-CM | POA: Diagnosis present

## 2015-10-05 LAB — CBC
HCT: 34.4 % — ABNORMAL LOW (ref 39.0–52.0)
HEMOGLOBIN: 11.5 g/dL — AB (ref 13.0–17.0)
MCH: 29.6 pg (ref 26.0–34.0)
MCHC: 33.4 g/dL (ref 30.0–36.0)
MCV: 88.4 fL (ref 78.0–100.0)
PLATELETS: 207 10*3/uL (ref 150–400)
RBC: 3.89 MIL/uL — AB (ref 4.22–5.81)
RDW: 14.4 % (ref 11.5–15.5)
WBC: 14.1 10*3/uL — ABNORMAL HIGH (ref 4.0–10.5)

## 2015-10-05 LAB — BASIC METABOLIC PANEL
ANION GAP: 12 (ref 5–15)
BUN: 51 mg/dL — ABNORMAL HIGH (ref 6–20)
CALCIUM: 8.7 mg/dL — AB (ref 8.9–10.3)
CHLORIDE: 103 mmol/L (ref 101–111)
CO2: 16 mmol/L — AB (ref 22–32)
CREATININE: 3.34 mg/dL — AB (ref 0.61–1.24)
GFR calc non Af Amer: 19 mL/min — ABNORMAL LOW (ref >60)
GFR, EST AFRICAN AMERICAN: 23 mL/min — AB (ref >60)
GLUCOSE: 104 mg/dL — AB (ref 65–99)
Potassium: 5.3 mmol/L — ABNORMAL HIGH (ref 3.5–5.1)
Sodium: 131 mmol/L — ABNORMAL LOW (ref 135–145)

## 2015-10-05 LAB — URINALYSIS W MICROSCOPIC (NOT AT ARMC)
Bilirubin Urine: NEGATIVE
GLUCOSE, UA: NEGATIVE mg/dL
Ketones, ur: NEGATIVE mg/dL
Nitrite: POSITIVE — AB
PH: 5.5 (ref 5.0–8.0)
Protein, ur: 100 mg/dL — AB
SPECIFIC GRAVITY, URINE: 1.014 (ref 1.005–1.030)

## 2015-10-05 LAB — I-STAT TROPONIN, ED: TROPONIN I, POC: 0.05 ng/mL (ref 0.00–0.08)

## 2015-10-05 LAB — CBG MONITORING, ED: GLUCOSE-CAPILLARY: 109 mg/dL — AB (ref 65–99)

## 2015-10-05 MED ORDER — LEVOFLOXACIN 750 MG PO TABS
750.0000 mg | ORAL_TABLET | Freq: Once | ORAL | Status: AC
  Administered 2015-10-05: 750 mg via ORAL
  Filled 2015-10-05: qty 1

## 2015-10-05 MED ORDER — LEVOFLOXACIN 750 MG PO TABS: 750.0000 mg | ORAL_TABLET | ORAL | 0 refills | Status: DC

## 2015-10-05 MED ORDER — DEXTROSE 5 % IV SOLN
1.0000 g | Freq: Once | INTRAVENOUS | Status: AC
  Administered 2015-10-05: 1 g via INTRAVENOUS
  Filled 2015-10-05: qty 10

## 2015-10-05 MED ORDER — SODIUM CHLORIDE 0.9 % IV BOLUS (SEPSIS)
1000.0000 mL | Freq: Once | INTRAVENOUS | Status: AC
  Administered 2015-10-05: 1000 mL via INTRAVENOUS

## 2015-10-05 NOTE — ED Triage Notes (Signed)
Pt reports shortness of breath, n/v, increase in urinary frequency for two days. Pt denies CP, diaphoresis, no fever at home.

## 2015-10-05 NOTE — ED Provider Notes (Signed)
[facility] DEPT Provider Note   CSN: [id number] Arrival date & time: 10/05/15  [id number]  First Provider Contact:  First MD Initiated Contact with Patient 10/05/15 [phone]        History   Chief Complaint Chief Complaint  Patient presents with  . Shortness of Breath  . Urinary Frequency  . Nausea  . Emesis    HPI Sheng Foxwell is a 54 y.o. male.  Dravyn Redwood is a 55 y.o. Male with a history of CKD and obesity, who presents to the ED complaining of subjective fevers, chills, urinary frequency, urinary urgency and slight dysuria for the past 2 days. Patient reports last night he felt he had a fever. He reports taking 2 Tylenol and then had chills. He had associated nausea, one episode of vomiting and felt like he was short of breath. He reports this resolved within an hour. He currently reports he is feeling better. He is still having the urinary symptoms. He denies hematuria. No history of UTIs previously. He reports he feels he has a head cold. He reports some nasal congestion and sneezing. No difficulty breathing in his chest. Patient reports one episode of vomiting. No diarrhea. No back pain. No abdominal pain, previous abdominal surgeries, previous UTIs, penile pain, testicular pain, GU rashes, diarrhea, hematuria, difficulty urinating, chest pain, lightheadedness, dizziness, syncope or current SOB.    The history is provided by the patient. No language interpreter was used.    Past Medical History:  Diagnosis Date  . Diverticulosis of large intestine without hemorrhage 01/31/2012  . Essential HTN 04/21/2006  . Hypertension   . OSA 05/21/2013  . 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 Hyperkalemia 08/27/2015  . Hyponatremia 08/27/2015  . Cellulitis of left lower extremity without foot 08/27/2015  . OSA 05/21/2013  . Severe obesity (BMI >= 40) ([place]) 12/02/2011  . Venous stasis of lower extremity 12/02/2011  . LVH 07/28/2009  .  Stage III CKD 07/28/2009  . Essential HTN 04/21/2006    Past Surgical History:  Procedure Laterality Date  . COLONOSCOPY  01/31/2012   Procedure: COLONOSCOPY;  Surgeon: Inda Castle, MD;  Location: [facility];  Service: Endoscopy;  Laterality: N/A;  bmi is 68  . NO PAST SURGERIES         Home Medications    Prior to Admission medications   Medication Sig Start Date End Date Taking? Authorizing Provider  aspirin 81 MG chewable tablet Chew 81 mg by mouth daily.     Yes Historical Provider, MD  carvedilol (COREG) 12.5 MG tablet TAKE ONE TABLET BY MOUTH TWICE DAILY WITH MEALS 12/10/14  Yes Patrecia Pour, MD  furosemide (LASIX) 40 MG tablet Take 1 tablet (40 mg total) by mouth daily. 09/30/15  Yes [place] Bing, DO  hydrochlorothiazide (HYDRODIURIL) 25 MG tablet TAKE ONE TABLET BY MOUTH  DAILY 03/24/15  Yes Patrecia Pour, MD  RAYALDEE 30 MCG CPCR Take 30 mcg by mouth daily. 08/14/15  Yes Historical Provider, MD  doxycycline (VIBRA-TABS) 100 MG tablet Take 1 tablet (100 mg total) by mouth 2 (two) times daily. Patient not taking: Reported on 10/05/2015 08/20/15   Rogue Bussing, MD  levofloxacin (LEVAQUIN) 750 MG tablet Take 1 tablet (750 mg total) by mouth every other day. 10/07/15   Waynetta Pean, PA-C  sodium polystyrene (KAYEXALATE) powder Take by mouth 2 (two) times daily. Take 30 mg PO BID - Dispense QS x 3 days  Patient not taking: Reported on 10/05/2015 08/28/15   Alveda Reasons, MD    Family History Family History  Problem Relation Age of Onset  . Breast cancer Mother   . Colon cancer Father     died at 36  . Hypertension Brother   . Hypertension Mother     Social History Social History  Substance Use Topics  . Smoking status: Never Smoker  . Smokeless tobacco: Never Used  . Alcohol use Yes     Comment: social-once a week     Allergies   Lisinopril   Review of Systems Review of Systems  Constitutional: Positive for chills and fever.  HENT: Negative for  congestion and sore throat.   Eyes: Negative for visual disturbance.  Respiratory: Positive for shortness of breath (resolved ). Negative for cough and wheezing.   Cardiovascular: Negative for chest pain and palpitations.  Gastrointestinal: Positive for nausea and vomiting. Negative for abdominal pain, blood in stool and diarrhea.  Genitourinary: Positive for dysuria, frequency and urgency. Negative for decreased urine volume, difficulty urinating, discharge, flank pain, genital sores, hematuria, penile pain, penile swelling, scrotal swelling and testicular pain.  Musculoskeletal: Negative for back pain and neck pain.  Skin: Negative for rash.  Neurological: Negative for syncope, weakness, light-headedness and headaches.     Physical Exam Updated Vital Signs BP 129/72   Pulse 65   Temp 98.8 F (37.1 C) (Rectal)   Resp 25   Ht 5\' 9"  (1.753 m)   Wt (!) 155.5 kg   SpO2 96%   BMI 50.62 kg/m   Physical Exam  Constitutional: He appears well-developed and well-nourished. No distress.  Nontoxic-appearing. Obese male.  HENT:  Head: Normocephalic and atraumatic.  Mouth/Throat: Oropharynx is clear and moist.  Eyes: Conjunctivae are normal. Pupils are equal, round, and reactive to light. Right eye exhibits no discharge. Left eye exhibits no discharge.  Neck: Neck supple. No JVD present.  Cardiovascular: Normal rate, regular rhythm, normal heart sounds and intact distal pulses.  Exam reveals no gallop and no friction rub.   No murmur heard. Heart rate is 76.  Pulmonary/Chest: Effort normal and breath sounds normal. No respiratory distress. He has no wheezes. He has no rales.  Lungs are clear to auscultation bilaterally. No increased work of breathing. No rales or rhonchi.  Abdominal: Soft. Bowel sounds are normal. He exhibits no distension. There is no tenderness. There is no guarding.  Abdomen is soft and nontender to palpation.  Musculoskeletal: He exhibits no edema or tenderness.  No  calf edema or tenderness.   Lymphadenopathy:    He has no cervical adenopathy.  Neurological: He is alert. Coordination normal.  Skin: Skin is warm and dry. Capillary refill takes less than 2 seconds. No rash noted. He is not diaphoretic. No erythema. No pallor.  Psychiatric: He has a normal mood and affect. His behavior is normal.  Nursing note and vitals reviewed.    ED Treatments / Results  Labs (all labs ordered are listed, but only abnormal results are displayed) Labs Reviewed  BASIC METABOLIC PANEL - Abnormal; Notable for the following:       Result Value   Sodium 131 (*)    Potassium 5.3 (*)    CO2 16 (*)    Glucose, Bld 104 (*)    BUN 51 (*)    Creatinine, Ser 3.34 (*)    Calcium 8.7 (*)    GFR calc non Af Amer 19 (*)    GFR calc Af  Amer 23 (*)    All other components within normal limits  CBC - Abnormal; Notable for the following:    WBC 14.1 (*)    RBC 3.89 (*)    Hemoglobin 11.5 (*)    HCT 34.4 (*)    All other components within normal limits  URINALYSIS W MICROSCOPIC (NOT AT [facility]) - Abnormal; Notable for the following:    APPearance TURBID (*)    Hgb urine dipstick LARGE (*)    Protein, ur 100 (*)    Nitrite POSITIVE (*)    Leukocytes, UA LARGE (*)    Bacteria, UA MANY (*)    Squamous Epithelial / LPF 6-30 (*)    All other components within normal limits  CBG MONITORING, ED - Abnormal; Notable for the following:    Glucose-Capillary 109 (*)    All other components within normal limits  URINE CULTURE  I-STAT TROPOININ, ED    EKG  EKG Interpretation  Date/Time:  Sunday October 05 2015 03:02:28 EDT Ventricular Rate:  104 PR Interval:  152 QRS Duration: 80 QT Interval:  328 QTC Calculation: 431 R Axis:   44 Text Interpretation:  Sinus tachycardia Low voltage QRS Borderline ECG tachcyardia now present Confirmed by Glynn Octave [phone]) on 10/05/2015 5:37:06 AM       Radiology Dg Chest 2 View  Result Date: 10/05/2015 CLINICAL DATA:  Chest  pain and shortness of breath. Cough and chest congestion. Fever EXAM: CHEST  2 VIEW COMPARISON:  09/05/2014. FINDINGS: Normal sized heart. Clear lungs. The interstitial markings remain prominent. Thoracic spine degenerative changes. IMPRESSION: No acute abnormality. Stable mild to moderate chronic interstitial lung disease. Electronically Signed   By: Claudie Revering M.D.   On: 10/05/2015 03:40    Procedures Procedures (including critical care time)  Medications Ordered in ED Medications  cefTRIAXone (ROCEPHIN) 1 g in dextrose 5 % 50 mL IVPB (0 g Intravenous Stopped 10/05/15 0813)  sodium chloride 0.9 % bolus 1,000 mL (1,000 mLs Intravenous New Bag/Given 10/05/15 0743)  levofloxacin (LEVAQUIN) tablet 750 mg (750 mg Oral Given 10/05/15 0924)     Initial Impression / Assessment and Plan / ED Course  I have reviewed the triage vital signs and the nursing notes.  Pertinent labs & imaging results that were available during my care of the patient were reviewed by me and considered in my medical decision making (see chart for details).  Clinical Course  Value Comment By Time  Resp: (!) 29 RN reports this was in error.  Waynetta Pean, PA-C 08/13 [id number]   This  is a 55 y.o. Male with a history of CKD and obesity, who presents to the ED complaining of subjective fevers, chills, urinary frequency, urinary urgency and slight dysuria for the past 2 days. Patient reports last night he felt he had a fever. He reports taking 2 Tylenol and then had chills. He had associated nausea, one episode of vomiting and felt like he was short of breath. He reports this resolved within an hour. He currently reports he is feeling better. He is still having the urinary symptoms. He denies hematuria. No history of UTIs previously. He reports he feels he has a head cold. He reports some nasal congestion and sneezing. No difficulty breathing in his chest. Patient reports one episode of vomiting. No diarrhea. No back pain. No  abdominal pain, previous abdominal surgeries, previous UTIs.  On exam the patient is afebrile nontoxic appearing. His abdomen is soft and nontender to palpation. No CVA  or flank tenderness. Heart rate is 76. Oxygen saturation 98% on room air. His lungs are clear to auscultation bilaterally. He denies any shortness of breath. He reports he has had some trouble breathing through his nose due to some nasal congestion.  Urinalysis returned nitrite positive with large leukocytes and too numerous to count white blood cells. Urine sent for culture. Troponin is not elevated. CBC reveals a white count of 14,000. BMP shows a creatinine around his baseline at 3.34 GFR of 23. Potassium is borderline elevated at 5.3. Chest x-ray is unremarkable. EKG shows no STEMI. I have low suspicion of ACS or PE today. It sounds like he was having URI like symptoms. No chest pain or SOB according to the patient.  Patient given a gram of Rocephin and fluid bolus. He is urinating at bedside without difficulty. He is tolerating by mouth bedside with water and graham crackers. No nausea or vomiting. And recheck patient reports he is feeling much better. We'll discharge with Levaquin every 48 hours due to the patient's creatinine clearance. Patient received his first dose of Levaquin in the emergency department. I advised to take the medication every 48 hours and discussed how to take the medication. I provided him with follow-up for urology with Dr. Tresa Moore. I discussed strict and specific return precautions. I advised the patient to follow-up with their primary care provider this week. I advised the patient to return to the emergency department with new or worsening symptoms or new concerns. The patient verbalized understanding and agreement with plan.   This patient was discussed with Dr. Claudine Mouton who agrees with assessment and plan.    Final Clinical Impressions(s) / ED Diagnoses   Final diagnoses:  UTI (lower urinary tract infection)     New Prescriptions New Prescriptions   LEVOFLOXACIN (LEVAQUIN) 750 MG TABLET    Take 1 tablet (750 mg total) by mouth every other day.     Waynetta Pean, PA-C 10/05/15 [phone]    Everlene Balls, MD 10/05/15 [phone]

## 2015-10-05 NOTE — ED Notes (Signed)
Checked CBG 109

## 2015-10-05 NOTE — Discharge Instructions (Signed)
Please take levaquin every other day. You had your first dose in the ED today. Your next dose of levaquin is Tuesday. Please follow up with urology.

## 2015-10-05 NOTE — ED Notes (Signed)
Pt given ice water.

## 2015-10-07 LAB — URINE CULTURE

## 2015-10-08 ENCOUNTER — Telehealth: Payer: Self-pay | Admitting: *Deleted

## 2015-10-08 NOTE — Telephone Encounter (Signed)
Post ED Visit - Positive Culture Follow-up  Culture report reviewed by antimicrobial stewardship pharmacist:  []  Elenor Quinones, Pharm.D. []  Heide Guile, Pharm.D., BCPS []  Parks Neptune, Pharm.D. []  Alycia Rossetti, Pharm.D., BCPS []  [place], Pharm.D., BCPS, AAHIVP []  Legrand Como, Pharm.D., BCPS, AAHIVP []  Milus Glazier, Pharm.D. []  Stephens November, Pharm.D. Andria Meuse, RPh  Positive urine culture Treated with Levofloxacin, organism sensitive to the same and no further patient follow-up is required at this time.  Harlon Flor [facility] 10/08/2015, 9:27 AM

## 2015-10-22 ENCOUNTER — Other Ambulatory Visit: Payer: Self-pay | Admitting: Family Medicine

## 2015-10-22 MED ORDER — FUROSEMIDE 40 MG PO TABS: 40.0000 mg | ORAL_TABLET | Freq: Every day | ORAL | 0 refills | Status: DC

## 2015-10-22 NOTE — Telephone Encounter (Signed)
Needs refill on furosemide.  [facility] [address]

## 2015-12-12 ENCOUNTER — Telehealth: Payer: Self-pay | Admitting: Family Medicine

## 2015-12-12 NOTE — Telephone Encounter (Signed)
Form placed in PCP box 

## 2015-12-12 NOTE — Telephone Encounter (Signed)
Disability review report form dropped off for at front desk for completion.  Verified that patient section of form has been completed.  Last DOS with PCP was 08/27/15.  Placed form in Red team folder to be completed by clinical staff.  Johnny [name]

## 2015-12-15 NOTE — Telephone Encounter (Signed)
Reviewed form. Discussed with Dr. Erin Hearing who says we do not fill permanent disability forms at [facility], only temporary or FMLA. Returned form to [employer] and told her to contact patient to inform them we do not fill this out. -- Harriet Butte, [place], PGY-1

## 2015-12-15 NOTE — Telephone Encounter (Signed)
Patient advised that the disability form can not be completed.  Family Medicine does not complete disability forms. Form placed up front for pick up.  Derl Barrow, RN

## 2016-10-15 ENCOUNTER — Encounter (HOSPITAL_COMMUNITY): Payer: Self-pay | Admitting: Emergency Medicine

## 2016-10-15 ENCOUNTER — Emergency Department (HOSPITAL_COMMUNITY)
Admission: EM | Admit: 2016-10-15 | Discharge: 2016-10-15 | Disposition: A | Discharge status: Home / Self Care | Payer: Medicaid Other | Attending: Emergency Medicine | Admitting: Emergency Medicine

## 2016-10-15 DIAGNOSIS — Z7982 Long term (current) use of aspirin: Secondary | ICD-10-CM | POA: Insufficient documentation

## 2016-10-15 DIAGNOSIS — N183 Chronic kidney disease, stage 3 (moderate): Secondary | ICD-10-CM | POA: Diagnosis not present

## 2016-10-15 DIAGNOSIS — I129 Hypertensive chronic kidney disease with stage 1 through stage 4 chronic kidney disease, or unspecified chronic kidney disease: Secondary | ICD-10-CM | POA: Insufficient documentation

## 2016-10-15 DIAGNOSIS — K625 Hemorrhage of anus and rectum: Secondary | ICD-10-CM | POA: Diagnosis present

## 2016-10-15 DIAGNOSIS — Z79899 Other long term (current) drug therapy: Secondary | ICD-10-CM | POA: Insufficient documentation

## 2016-10-15 LAB — ABO/RH: ABO/RH(D): A POS

## 2016-10-15 LAB — COMPREHENSIVE METABOLIC PANEL
ALBUMIN: 3.2 g/dL — AB (ref 3.5–5.0)
ALT: 13 U/L — ABNORMAL LOW (ref 17–63)
ANION GAP: 10 (ref 5–15)
AST: 19 U/L (ref 15–41)
Alkaline Phosphatase: 44 U/L (ref 38–126)
BUN: 48 mg/dL — ABNORMAL HIGH (ref 6–20)
CALCIUM: 8.9 mg/dL (ref 8.9–10.3)
CHLORIDE: 104 mmol/L (ref 101–111)
CO2: 24 mmol/L (ref 22–32)
Creatinine, Ser: 3.55 mg/dL — ABNORMAL HIGH (ref 0.61–1.24)
GFR calc Af Amer: 21 mL/min — ABNORMAL LOW (ref >60)
GFR calc non Af Amer: 18 mL/min — ABNORMAL LOW (ref >60)
GLUCOSE: 119 mg/dL — AB (ref 65–99)
POTASSIUM: 4.2 mmol/L (ref 3.5–5.1)
SODIUM: 138 mmol/L (ref 135–145)
Total Bilirubin: 0.6 mg/dL (ref 0.3–1.2)
Total Protein: 7.2 g/dL (ref 6.5–8.1)

## 2016-10-15 LAB — TYPE AND SCREEN
ABO/RH(D): A POS
Antibody Screen: NEGATIVE

## 2016-10-15 LAB — CBC
HEMATOCRIT: 33.5 % — AB (ref 39.0–52.0)
HEMOGLOBIN: 11.4 g/dL — AB (ref 13.0–17.0)
MCH: 28.9 pg (ref 26.0–34.0)
MCHC: 34 g/dL (ref 30.0–36.0)
MCV: 84.8 fL (ref 78.0–100.0)
Platelets: 222 10*3/uL (ref 150–400)
RBC: 3.95 MIL/uL — AB (ref 4.22–5.81)
RDW: 15.1 % (ref 11.5–15.5)
WBC: 4.2 10*3/uL (ref 4.0–10.5)

## 2016-10-15 NOTE — ED Triage Notes (Addendum)
Pt reports seeing dark red blood when he had a bowel movement this am, reports he takes aspirin, denies any pain. Pt thinks he may have hemorrhoids, no hx of the same.

## 2016-10-15 NOTE — ED Notes (Signed)
ED Provider at bedside. 

## 2016-10-15 NOTE — ED Provider Notes (Signed)
[facility] DEPT Provider Note   CSN: [id number] Arrival date & time: 10/15/16  1100     History   Chief Complaint Chief Complaint  Patient presents with  . Rectal Bleeding    HPI Johnny [name] is a 56 y.o. male.  56 yo M with a chief complaint of bright red blood per bowel. Patient had one episode of this this morning. Has had some itching and burning at his rectum. Has a history of hemorrhoids and thinks the same. Denies weakness chest pain shortness of breath near-syncopal feeling. Denies abdominal pain vomiting or diarrhea. Had only one episode of this today. Ate pizza last night. Denies fevers or chills.denies blood thinner use.   The history is provided by the patient.  Illness  This is a new problem. The current episode started 3 to 5 hours ago. The problem occurs rarely. The problem has been resolved. Pertinent negatives include no chest pain, no abdominal pain, no headaches and no shortness of breath. Nothing aggravates the symptoms. Nothing relieves the symptoms. He has tried nothing for the symptoms. The treatment provided no relief.    Past Medical History:  Diagnosis Date  . Diverticulosis of large intestine without hemorrhage 01/31/2012  . Essential HTN 04/21/2006  . Hypertension   . OSA 05/21/2013  . 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 Hyperkalemia 08/27/2015  . Hyponatremia 08/27/2015  . Cellulitis of left lower extremity without foot 08/27/2015  . OSA 05/21/2013  . Severe obesity (BMI >= 40) ([place]) 12/02/2011  . Venous stasis of lower extremity 12/02/2011  . LVH 07/28/2009  . Stage III CKD 07/28/2009  . Essential HTN 04/21/2006    Past Surgical History:  Procedure Laterality Date  . COLONOSCOPY  01/31/2012   Procedure: COLONOSCOPY;  Surgeon: Inda Castle, MD;  Location: [facility];  Service: Endoscopy;  Laterality: N/A;  bmi is 35  . NO PAST SURGERIES         Home Medications    Prior to  Admission medications   Medication Sig Start Date End Date Taking? Authorizing Provider  aspirin 81 MG chewable tablet Chew 81 mg by mouth daily.      [provider]  carvedilol (COREG) 12.5 MG tablet TAKE ONE TABLET BY MOUTH TWICE DAILY WITH MEALS 12/10/14   Patrecia Pour, MD  doxycycline (VIBRA-TABS) 100 MG tablet Take 1 tablet (100 mg total) by mouth 2 (two) times daily. Patient not taking: Reported on 10/05/2015 08/20/15   [place] Bussing, MD  furosemide (LASIX) 40 MG tablet Take 1 tablet (40 mg total) by mouth daily. 10/22/15   [place] Bing, DO  hydrochlorothiazide (HYDRODIURIL) 25 MG tablet TAKE ONE TABLET BY MOUTH  DAILY 03/24/15   Patrecia Pour, MD  levofloxacin (LEVAQUIN) 750 MG tablet Take 1 tablet (750 mg total) by mouth every other day. 10/07/15   Waynetta Pean, PA-C  RAYALDEE 30 MCG CPCR Take 30 mcg by mouth daily. 08/14/15   [provider]  sodium polystyrene (KAYEXALATE) powder Take by mouth 2 (two) times daily. Take 30 mg PO BID - Dispense QS x 3 days Patient not taking: Reported on 10/05/2015 08/28/15   Alveda Reasons, MD    Family History Family History  Problem Relation Age of Onset  . Breast cancer Mother   . Hypertension Mother   . Colon cancer Father        died at 39  . Hypertension Brother  Social History Social History  Substance Use Topics  . Smoking status: Never Smoker  . Smokeless tobacco: Never Used  . Alcohol use Yes     Comment: social-once a week     Allergies   Lisinopril   Review of Systems Review of Systems  Constitutional: Negative for chills and fever.  HENT: Negative for congestion and facial swelling.   Eyes: Negative for discharge and visual disturbance.  Respiratory: Negative for shortness of breath.   Cardiovascular: Negative for chest pain and palpitations.  Gastrointestinal: Positive for blood in stool. Negative for abdominal pain, diarrhea and vomiting.  Musculoskeletal: Negative for  arthralgias and myalgias.  Skin: Negative for color change and rash.  Neurological: Negative for tremors, syncope and headaches.  Psychiatric/Behavioral: Negative for confusion and dysphoric mood.     Physical Exam Updated Vital Signs BP (!) 180/104   Pulse 70   Temp 98.2 F (36.8 C) (Oral)   Resp 18   SpO2 99%   Physical Exam  Constitutional: He is oriented to person, place, and time. He appears well-developed and well-nourished.  HENT:  Head: Normocephalic and atraumatic.  Eyes: Pupils are equal, round, and reactive to light. EOM are normal.  Neck: Normal range of motion. Neck supple. No JVD present.  Cardiovascular: Normal rate and regular rhythm.  Exam reveals no gallop and no friction rub.   No murmur heard. Pulmonary/Chest: No respiratory distress. He has no wheezes.  Abdominal: He exhibits no distension and no mass. There is no tenderness. There is no rebound and no guarding.  Genitourinary:  Genitourinary Comments: orange pinkish appearing stool  Musculoskeletal: Normal range of motion.  Changes consistent with lymphedema  Neurological: He is alert and oriented to person, place, and time.  Skin: No rash noted. No pallor.  Psychiatric: He has a normal mood and affect. His behavior is normal.  Nursing note and vitals reviewed.    ED Treatments / Results  Labs (all labs ordered are listed, but only abnormal results are displayed) Labs Reviewed  COMPREHENSIVE METABOLIC PANEL - Abnormal; Notable for the following:       Result Value   Glucose, Bld 119 (*)    BUN 48 (*)    Creatinine, Ser 3.55 (*)    Albumin 3.2 (*)    ALT 13 (*)    GFR calc non Af Amer 18 (*)    GFR calc Af Amer 21 (*)    All other components within normal limits  CBC - Abnormal; Notable for the following:    RBC 3.95 (*)    Hemoglobin 11.4 (*)    HCT 33.5 (*)    All other components within normal limits  POC OCCULT BLOOD, ED  TYPE AND SCREEN  ABO/RH    EKG  EKG Interpretation None         Radiology No results found.  Procedures Procedures (including critical care time)  Medications Ordered in ED Medications - No data to display   Initial Impression / Assessment and Plan / ED Course  I have reviewed the triage vital signs and the nursing notes.  Pertinent labs & imaging results that were available during my care of the patient were reviewed by me and considered in my medical decision making (see chart for details).     56 yo M With a chief complaints of bright red blood per rectum. Had one episode today. Hemoglobin is at baseline. Vital signs are stable. Rectal exam with pinkish appearing stool. With stable vitals and no  continued issues feel safe for discharge at this time. We'll have him follow-up with GI. He did have a colonoscopy done at [facility] that was unremarkable 5 years ago.   2:47 PM:  I have discussed the diagnosis/risks/treatment options with the patient and believe the pt to be eligible for discharge home to follow-up with GI, PCP. We also discussed returning to the ED immediately if new or worsening sx occur. We discussed the sx which are most concerning (e.g., sudden worsening pain, fever, inability to tolerate by mouth) that necessitate immediate return. Medications administered to the patient during their visit and any new prescriptions provided to the patient are listed below.  Medications given during this visit Medications - No data to display   The patient appears reasonably screen and/or stabilized for discharge and I doubt any other medical condition or other [facility] requiring further screening, evaluation, or treatment in the ED at this time prior to discharge.    Final Clinical Impressions(s) / ED Diagnoses   Final diagnoses:  Rectal bleeding    New Prescriptions New Prescriptions   No medications on file     Binyomin, Brann, DO 10/15/16 1447

## 2016-10-15 NOTE — Discharge Instructions (Signed)
Return for worsening, feeling weak like you'll pass out.  Call your GI doctor for an appointment. You can also try over-the-counter hemorrhoid creams. Try and limit your time on the toilet

## 2017-07-11 ENCOUNTER — Other Ambulatory Visit: Payer: Self-pay

## 2017-07-11 DIAGNOSIS — N186 End stage renal disease: Secondary | ICD-10-CM

## 2017-07-11 DIAGNOSIS — Z992 Dependence on renal dialysis: Principal | ICD-10-CM

## 2017-08-17 ENCOUNTER — Encounter: Payer: Self-pay | Admitting: Vascular Surgery

## 2017-08-17 ENCOUNTER — Ambulatory Visit: Payer: Medicaid Other | Admitting: Vascular Surgery

## 2017-08-17 ENCOUNTER — Ambulatory Visit (INDEPENDENT_AMBULATORY_CARE_PROVIDER_SITE_OTHER)
Admission: RE | Admit: 2017-08-17 | Discharge: 2017-08-17 | Disposition: A | Discharge status: Home / Self Care | Payer: Medicaid Other | Source: Ambulatory Visit | Attending: Vascular Surgery | Admitting: Vascular Surgery

## 2017-08-17 ENCOUNTER — Ambulatory Visit (HOSPITAL_COMMUNITY)
Admission: RE | Admit: 2017-08-17 | Discharge: 2017-08-17 | Disposition: A | Discharge status: Home / Self Care | Payer: Medicaid Other | Source: Ambulatory Visit | Attending: Vascular Surgery | Admitting: Vascular Surgery

## 2017-08-17 VITALS — BP 171/109 | HR 63 | Temp 97.6°F | Resp 20 | Ht 69.0 in | Wt 328.4 lb

## 2017-08-17 DIAGNOSIS — Z992 Dependence on renal dialysis: Secondary | ICD-10-CM | POA: Insufficient documentation

## 2017-08-17 DIAGNOSIS — I120 Hypertensive chronic kidney disease with stage 5 chronic kidney disease or end stage renal disease: Secondary | ICD-10-CM | POA: Diagnosis present

## 2017-08-17 DIAGNOSIS — N184 Chronic kidney disease, stage 4 (severe): Secondary | ICD-10-CM | POA: Diagnosis not present

## 2017-08-17 DIAGNOSIS — Z01810 Encounter for preprocedural cardiovascular examination: Secondary | ICD-10-CM | POA: Diagnosis present

## 2017-08-17 DIAGNOSIS — N186 End stage renal disease: Secondary | ICD-10-CM | POA: Insufficient documentation

## 2017-08-17 NOTE — Progress Notes (Signed)
[name] MRN: [id number] DOB: [birth year] Sex: male   REASON FOR CONSULT:    To evaluate for hemodialysis access.  The consult is requested by Dr. Posey Pronto.  HPI:   Johnny [name] is a pleasant 57 y.o. male, who is referred for evaluation for hemodialysis access.  I have reviewed the records from the referring office.  Importantly, if we are unable to place an AV fistula we have been asked to wait and not place an AV graft.  The patient has stage IV chronic kidney disease.  In addition he has hypertension, history of left ventricular hypertrophy, morbid obesity, obstructive sleep apnea, and a history of DVT and PE back in 2011.  In addition he has venous stasis.  His GFR at his last visit was 18 mL/min.  It had previously been 12.  The patient has stage IV chronic kidney disease secondary to hypertension.  He denies any recent uremic symptoms.  Pacifically, he denies nausea, vomiting, fatigue, anorexia, palpitations, or shortness of breath.  Past Medical History:  Diagnosis Date  . Diverticulosis of large intestine without hemorrhage 01/31/2012  . Essential HTN 04/21/2006  . Hypertension   . OSA 05/21/2013  . 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 Breast cancer Mother   . Hypertension Mother   . Colon cancer Father        died at 39  . Hypertension Brother     SOCIAL HISTORY: He is not a smoker. Social History   Socioeconomic History  . Marital status: Single    Spouse name: Not on file  . Number of children: 1  . Years of education: Not on file  . Highest education level: Not on file  Occupational History  . Occupation: disabled    Fish farm manager: UNEMPLOYED  Social Needs  . Financial resource strain: Not on file  . Food insecurity:    Worry: Not on file    Inability: Not on file  . Transportation needs:    Medical: Not on file    Non-medical: Not on file  Tobacco Use  . Smoking status: Never Smoker  . Smokeless  tobacco: Never Used  Substance and Sexual Activity  . Alcohol use: Yes    Comment: social-once a week  . Drug use: No    Comment: stopped marijuana about Sep or Oct 2013  . Sexual activity: Not on file  Lifestyle  . Physical activity:    Days per week: Not on file    Minutes per session: Not on file  . Stress: Not on file  Relationships  . Social connections:    Talks on phone: Not on file    Gets together: Not on file    Attends religious service: Not on file    Active member of club or organization: Not on file    Attends meetings of clubs or organizations: Not on file    Relationship status: Not on file  . Intimate partner violence:    Fear of current or ex partner: Not on file    Emotionally abused: Not on file    Physically abused: Not on file    Forced sexual activity: Not on file  Other Topics Concern  . Not on file  Social History Narrative  . Not on file    Allergies  Allergen Reactions  . Lisinopril Swelling    Angioedema    Current Outpatient Medications  Medication Sig Dispense Refill  .  aspirin 81 MG chewable tablet Chew 81 mg by mouth daily.      . carvedilol (COREG) 12.5 MG tablet TAKE ONE TABLET BY MOUTH TWICE DAILY WITH MEALS 180 tablet 0  . carvedilol (COREG) 25 MG tablet   12  . doxycycline (VIBRA-TABS) 100 MG tablet Take 1 tablet (100 mg total) by mouth 2 (two) times daily. 10 tablet 0  . furosemide (LASIX) 40 MG tablet Take 1 tablet (40 mg total) by mouth daily. 10 tablet 0  . hydrochlorothiazide (HYDRODIURIL) 25 MG tablet TAKE ONE TABLET BY MOUTH  DAILY 90 tablet 0  . levofloxacin (LEVAQUIN) 750 MG tablet Take 1 tablet (750 mg total) by mouth every other day. 4 tablet 0  . RAYALDEE 30 MCG CPCR Take 30 mcg by mouth daily.  3  . sodium polystyrene (KAYEXALATE) powder Take by mouth 2 (two) times daily. Take 30 mg PO BID - Dispense QS x 3 days 454 g 0   No current facility-administered medications for this visit.     REVIEW OF SYSTEMS:  [X]   denotes positive finding, [ ]  denotes negative finding Cardiac  Comments:  Chest pain or chest pressure:    Shortness of breath upon exertion:    Short of breath when lying flat:    Irregular heart rhythm:        Vascular    Pain in calf, thigh, or hip brought on by ambulation:    Pain in feet at night that wakes you up from your sleep:     Blood clot in your veins: x   Leg swelling:  x  chronic left leg swelling secondary to chronic venous insufficiency      Pulmonary    Oxygen at home:    Productive cough:     Wheezing:         Neurologic    Sudden weakness in arms or legs:     Sudden numbness in arms or legs:     Sudden onset of difficulty speaking or slurred speech:    Temporary loss of vision in one eye:     Problems with dizziness:         Gastrointestinal    Blood in stool:     Vomited blood:         Genitourinary    Burning when urinating:     Blood in urine:        Psychiatric    Major depression:         Hematologic    Bleeding problems:    Problems with blood clotting too easily:        Skin    Rashes or ulcers:        Constitutional    Fever or chills:     PHYSICAL EXAM:   Vitals:   08/17/17 1350 08/17/17 1352  BP: (!) 170/119 (!) 171/109  Pulse: 63   Resp: 20   Temp: 97.6 F (36.4 C)   TempSrc: Oral   SpO2: 97%   Weight: (!) 328 lb 6.4 oz (149 kg)   Height: 5\' 9"  (1.753 m)    GENERAL: The patient is a well-nourished male, in no acute distress. The vital signs are documented above. CARDIAC: There is a regular rate and rhythm.  VASCULAR: I do not detect carotid bruits. He has palpable brachial and radial pulses bilaterally. He has chronic left lower extremity swelling. PULMONARY: There is good air exchange bilaterally without wheezing or rales. ABDOMEN: Soft and non-tender with normal pitched  bowel sounds.  MUSCULOSKELETAL: There are no major deformities or cyanosis. NEUROLOGIC: No focal weakness or paresthesias are detected. SKIN: There  are no ulcers or rashes noted. PSYCHIATRIC: The patient has a normal affect.  DATA:    UPPER EXTREMITY ARTERIAL DUPLEX: I have independently interpreted his upper extremity arterial duplex scan.  On the right side there is a biphasic radial and ulnar signal.  The brachial artery measures 0.55 cm in diameter.  On the left side there is a biphasic radial and ulnar signal.  The brachial artery measures 0.51 cm in diameter.  BILATERAL UPPER EXTREMITY VEIN MAPPING: I have independently interpreted his upper extremity vein map today.  On the right side the forearm cephalic vein looks marginal in size.  The upper arm cephalic vein looks reasonable in size.  The basilic vein looks marginal in size.  On the left side the forearm and upper arm cephalic vein looks reasonable in size.  The basilic vein looks reasonable in size.  MEDICAL ISSUES:   STAGE IV CHRONIC KIDNEY DISEASE: Based on his duplex scanning appears to be a reasonable candidate for a fistula in the left arm.  He could potentially have a radiocephalic fistula or a brachiocephalic fistula.  We have been asked to not place a graft if he is not a candidate for a fistula.  I have discussed the indications for the procedure.  I have also discussed the potential complications of surgery including, but not limited to, bleeding, wound healing problems, arm swelling, failure of the fistula to mature, the need for interventions on the fistula to help with maturation, and steal syndrome.  Currently he is reluctant to consider surgery and would like to think about this more before scheduling surgery.  Certainly if he calls we can schedule him for placement of a fistula in the left arm.  HYPERTENSION: The patient's initial blood pressure today was elevated. We repeated this and this was still elevated. We have encouraged the patient to follow up with their primary care physician for management of their blood pressure.   Deitra [facility] [phone]

## 2017-09-01 IMAGING — CR DG CHEST 2V
2 series · 2 of 2 positions shown · non-contrast
Comparison: 09/05/2014.

CLINICAL DATA: Chest pain and shortness of breath. Cough and chest
congestion. Fever

EXAM:
CHEST  2 VIEW

[chest pa]
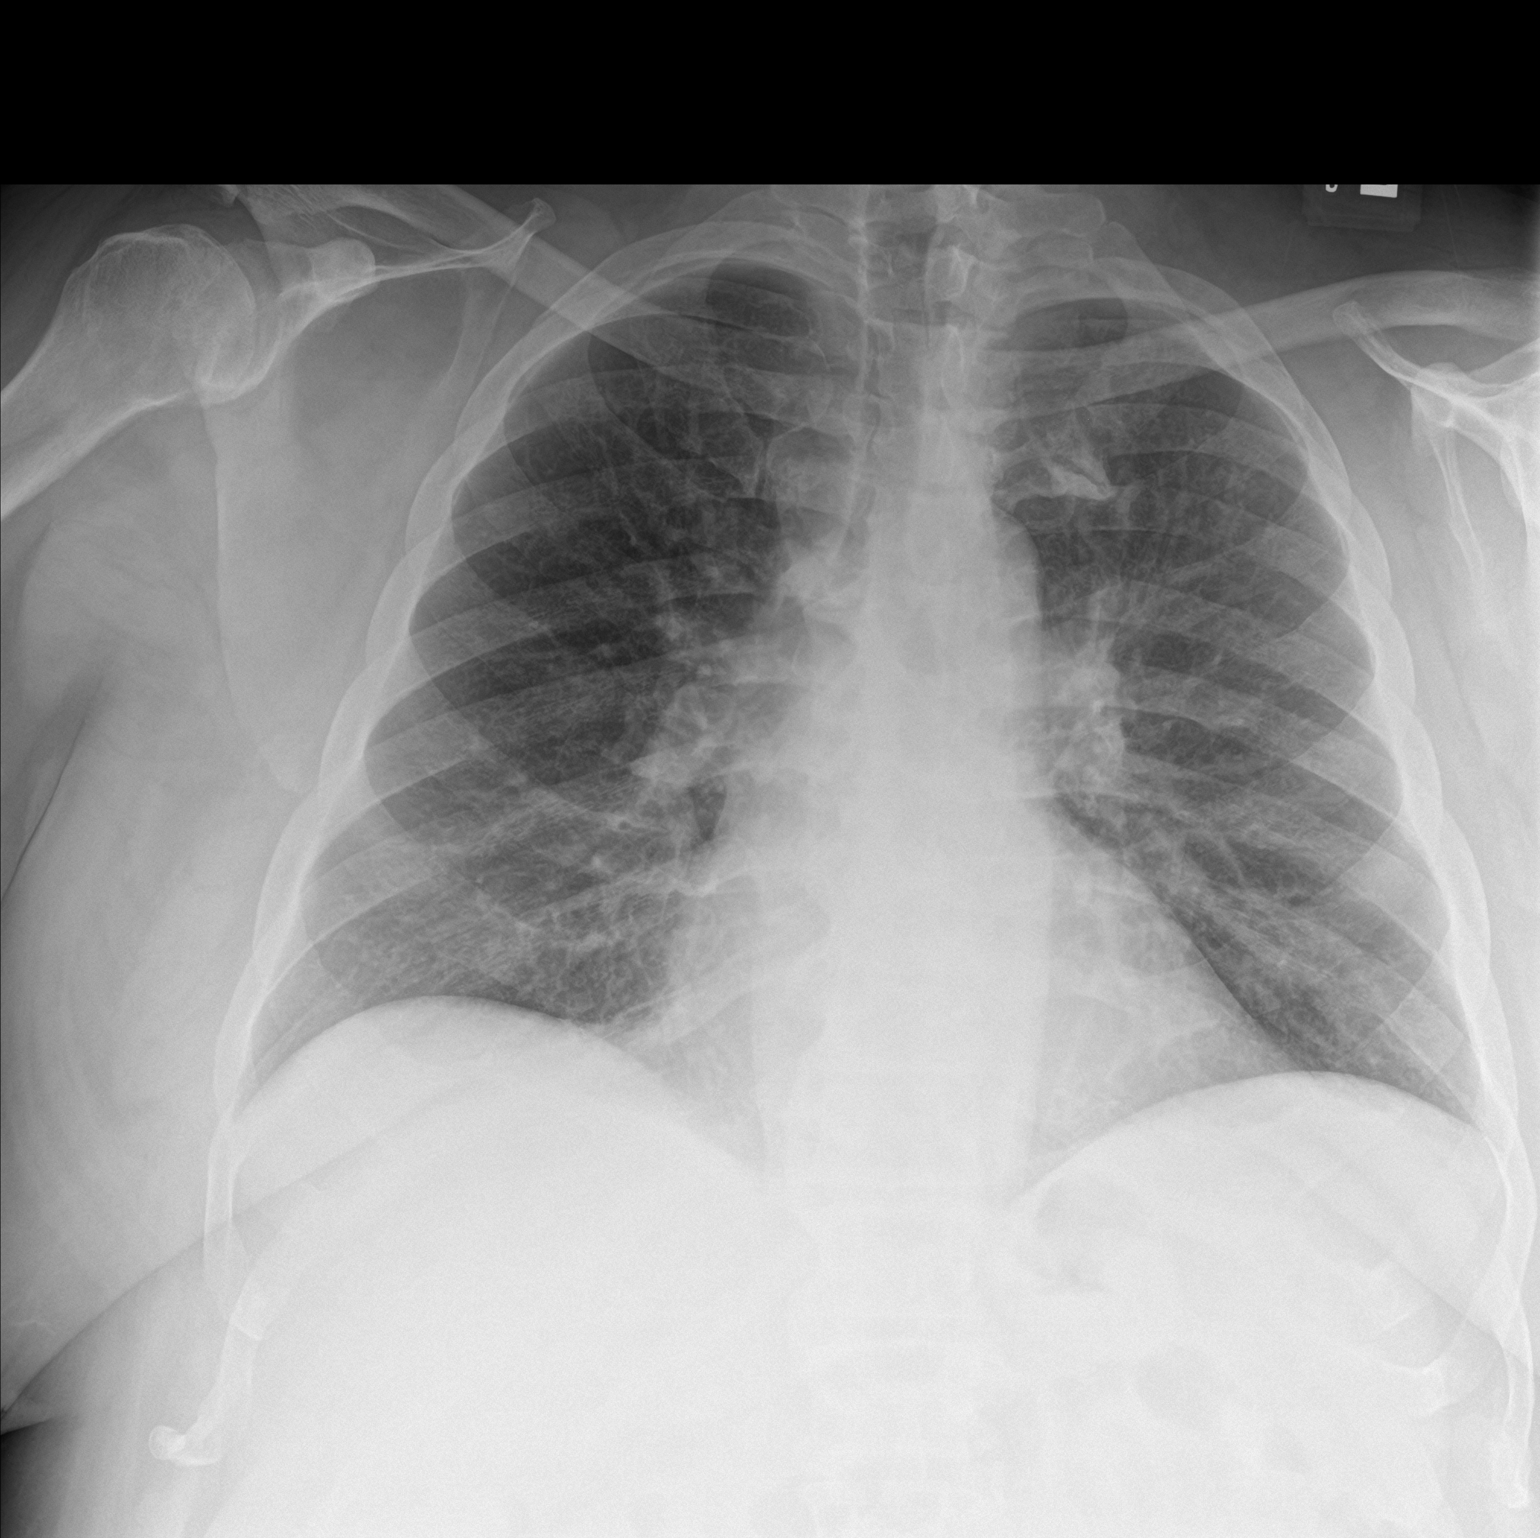

[chest lat]
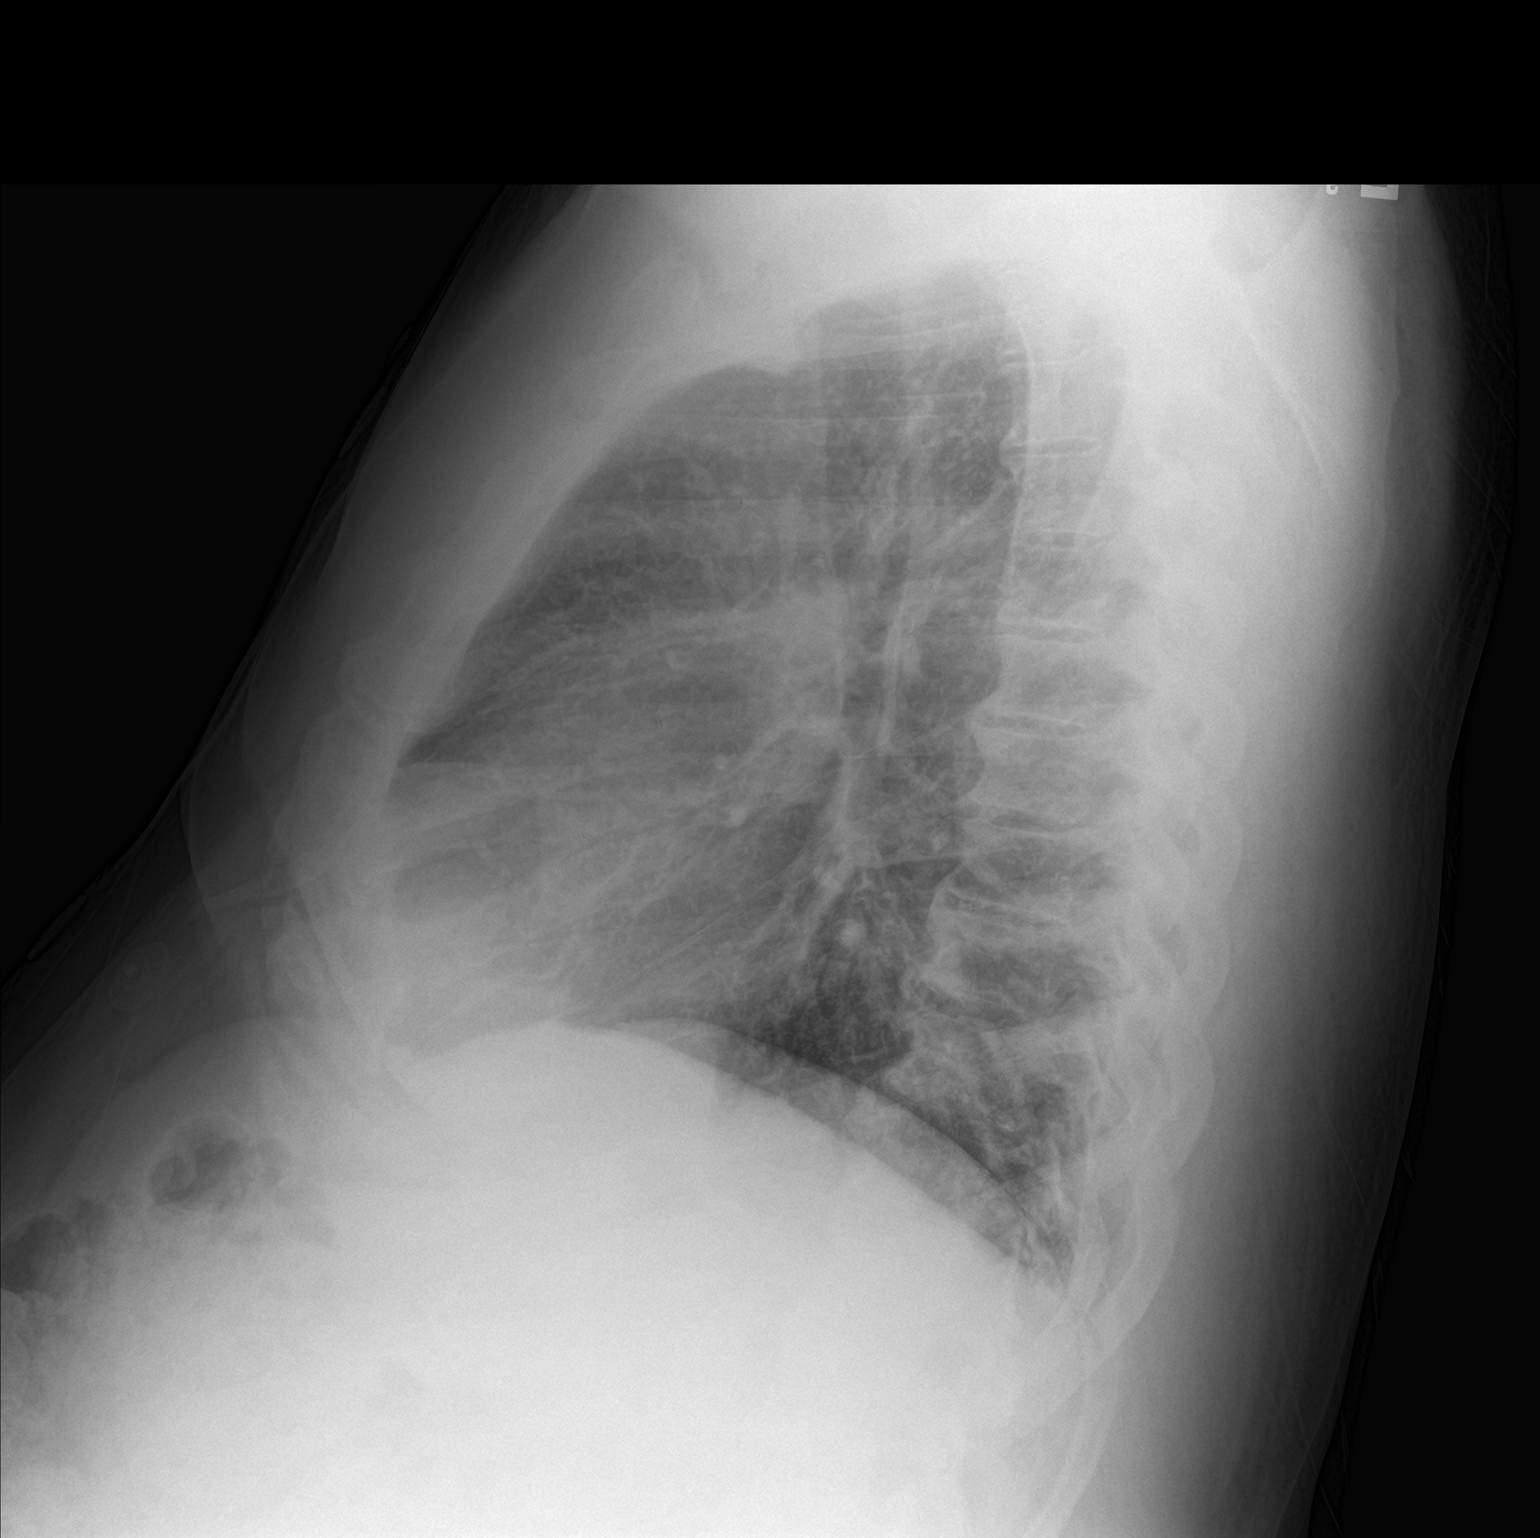

[2 of 2 positions shown; findings below may reference images not displayed]

FINDINGS: Normal sized heart. Clear lungs. The interstitial markings remain
prominent. Thoracic spine degenerative changes.
IMPRESSION: No acute abnormality. Stable mild to moderate chronic interstitial
lung disease.

## 2017-09-12 DIAGNOSIS — N184 Chronic kidney disease, stage 4 (severe): Secondary | ICD-10-CM | POA: Diagnosis not present

## 2017-09-12 DIAGNOSIS — N2581 Secondary hyperparathyroidism of renal origin: Secondary | ICD-10-CM | POA: Diagnosis not present

## 2017-09-23 DIAGNOSIS — N184 Chronic kidney disease, stage 4 (severe): Secondary | ICD-10-CM | POA: Diagnosis not present

## 2017-09-23 DIAGNOSIS — I129 Hypertensive chronic kidney disease with stage 1 through stage 4 chronic kidney disease, or unspecified chronic kidney disease: Secondary | ICD-10-CM | POA: Diagnosis not present

## 2017-09-23 DIAGNOSIS — N2581 Secondary hyperparathyroidism of renal origin: Secondary | ICD-10-CM | POA: Diagnosis not present

## 2017-09-23 DIAGNOSIS — D631 Anemia in chronic kidney disease: Secondary | ICD-10-CM | POA: Diagnosis not present

## 2017-12-12 ENCOUNTER — Other Ambulatory Visit: Payer: Self-pay

## 2017-12-12 ENCOUNTER — Inpatient Hospital Stay (HOSPITAL_COMMUNITY)
Admission: EM | Admit: 2017-12-12 | Discharge: 2017-12-14 | DRG: 603 | Disposition: A | Discharge status: Home / Self Care | Payer: Medicaid Other | Attending: Family Medicine | Admitting: Family Medicine

## 2017-12-12 ENCOUNTER — Encounter (HOSPITAL_COMMUNITY): Payer: Self-pay

## 2017-12-12 ENCOUNTER — Emergency Department (HOSPITAL_COMMUNITY): Payer: Medicaid Other

## 2017-12-12 DIAGNOSIS — Z8249 Family history of ischemic heart disease and other diseases of the circulatory system: Secondary | ICD-10-CM

## 2017-12-12 DIAGNOSIS — I878 Other specified disorders of veins: Secondary | ICD-10-CM | POA: Diagnosis not present

## 2017-12-12 DIAGNOSIS — N433 Hydrocele, unspecified: Secondary | ICD-10-CM | POA: Diagnosis present

## 2017-12-12 DIAGNOSIS — D631 Anemia in chronic kidney disease: Secondary | ICD-10-CM | POA: Diagnosis present

## 2017-12-12 DIAGNOSIS — Z86711 Personal history of pulmonary embolism: Secondary | ICD-10-CM

## 2017-12-12 DIAGNOSIS — N184 Chronic kidney disease, stage 4 (severe): Secondary | ICD-10-CM | POA: Diagnosis present

## 2017-12-12 DIAGNOSIS — Z6841 Body Mass Index (BMI) 40.0 and over, adult: Secondary | ICD-10-CM

## 2017-12-12 DIAGNOSIS — I129 Hypertensive chronic kidney disease with stage 1 through stage 4 chronic kidney disease, or unspecified chronic kidney disease: Secondary | ICD-10-CM | POA: Diagnosis present

## 2017-12-12 DIAGNOSIS — I517 Cardiomegaly: Secondary | ICD-10-CM

## 2017-12-12 DIAGNOSIS — Z79899 Other long term (current) drug therapy: Secondary | ICD-10-CM

## 2017-12-12 DIAGNOSIS — Z23 Encounter for immunization: Secondary | ICD-10-CM | POA: Diagnosis not present

## 2017-12-12 DIAGNOSIS — N3289 Other specified disorders of bladder: Secondary | ICD-10-CM | POA: Diagnosis not present

## 2017-12-12 DIAGNOSIS — Z862 Personal history of diseases of the blood and blood-forming organs and certain disorders involving the immune mechanism: Secondary | ICD-10-CM | POA: Insufficient documentation

## 2017-12-12 DIAGNOSIS — I10 Essential (primary) hypertension: Secondary | ICD-10-CM

## 2017-12-12 DIAGNOSIS — E785 Hyperlipidemia, unspecified: Secondary | ICD-10-CM | POA: Diagnosis present

## 2017-12-12 DIAGNOSIS — N5089 Other specified disorders of the male genital organs: Secondary | ICD-10-CM | POA: Diagnosis present

## 2017-12-12 DIAGNOSIS — G4733 Obstructive sleep apnea (adult) (pediatric): Secondary | ICD-10-CM | POA: Diagnosis present

## 2017-12-12 DIAGNOSIS — I890 Lymphedema, not elsewhere classified: Secondary | ICD-10-CM | POA: Diagnosis not present

## 2017-12-12 DIAGNOSIS — I861 Scrotal varices: Secondary | ICD-10-CM | POA: Diagnosis present

## 2017-12-12 DIAGNOSIS — M7989 Other specified soft tissue disorders: Secondary | ICD-10-CM | POA: Diagnosis not present

## 2017-12-12 DIAGNOSIS — Z7982 Long term (current) use of aspirin: Secondary | ICD-10-CM | POA: Diagnosis not present

## 2017-12-12 DIAGNOSIS — L03115 Cellulitis of right lower limb: Secondary | ICD-10-CM | POA: Diagnosis not present

## 2017-12-12 DIAGNOSIS — N189 Chronic kidney disease, unspecified: Secondary | ICD-10-CM | POA: Insufficient documentation

## 2017-12-12 HISTORY — DX: Lymphedema, not elsewhere classified: I89.0

## 2017-12-12 LAB — COMPREHENSIVE METABOLIC PANEL
ALT: 12 U/L (ref 0–44)
AST: 14 U/L — AB (ref 15–41)
Albumin: 2.7 g/dL — ABNORMAL LOW (ref 3.5–5.0)
Alkaline Phosphatase: 47 U/L (ref 38–126)
Anion gap: 12 (ref 5–15)
BILIRUBIN TOTAL: 0.5 mg/dL (ref 0.3–1.2)
BUN: 55 mg/dL — AB (ref 6–20)
CALCIUM: 9 mg/dL (ref 8.9–10.3)
CO2: 27 mmol/L (ref 22–32)
CREATININE: 4.06 mg/dL — AB (ref 0.61–1.24)
Chloride: 99 mmol/L (ref 98–111)
GFR, EST AFRICAN AMERICAN: 18 mL/min — AB (ref >60)
GFR, EST NON AFRICAN AMERICAN: 15 mL/min — AB (ref >60)
Glucose, Bld: 109 mg/dL — ABNORMAL HIGH (ref 70–99)
Potassium: 4.8 mmol/L (ref 3.5–5.1)
Sodium: 138 mmol/L (ref 135–145)
TOTAL PROTEIN: 7.7 g/dL (ref 6.5–8.1)

## 2017-12-12 LAB — I-STAT CHEM 8, ED
BUN: 81 mg/dL — ABNORMAL HIGH (ref 6–20)
BUN: 53 mg/dL — AB (ref 6–20)
CALCIUM ION: 0.92 mmol/L — AB (ref 1.15–1.40)
CALCIUM ION: 1.05 mmol/L — AB (ref 1.15–1.40)
Chloride: 101 mmol/L (ref 98–111)
Chloride: 99 mmol/L (ref 98–111)
Creatinine, Ser: 4.5 mg/dL — ABNORMAL HIGH (ref 0.61–1.24)
Creatinine, Ser: 4.5 mg/dL — ABNORMAL HIGH (ref 0.61–1.24)
GLUCOSE: 108 mg/dL — AB (ref 70–99)
Glucose, Bld: 102 mg/dL — ABNORMAL HIGH (ref 70–99)
HCT: 34 % — ABNORMAL LOW (ref 39.0–52.0)
HEMATOCRIT: 33 % — AB (ref 39.0–52.0)
HEMOGLOBIN: 11.6 g/dL — AB (ref 13.0–17.0)
Hemoglobin: 11.2 g/dL — ABNORMAL LOW (ref 13.0–17.0)
Potassium: 6.9 mmol/L (ref 3.5–5.1)
Potassium: 5.1 mmol/L (ref 3.5–5.1)
SODIUM: 135 mmol/L (ref 135–145)
SODIUM: 137 mmol/L (ref 135–145)
TCO2: 33 mmol/L — AB (ref 22–32)
TCO2: 33 mmol/L — AB (ref 22–32)

## 2017-12-12 LAB — CBC WITH DIFFERENTIAL/PLATELET
Abs Immature Granulocytes: 0.07 10*3/uL (ref 0.00–0.07)
BASOS PCT: 0 %
Basophils Absolute: 0 10*3/uL (ref 0.0–0.1)
EOS ABS: 0.1 10*3/uL (ref 0.0–0.5)
EOS PCT: 1 %
HCT: 35.2 % — ABNORMAL LOW (ref 39.0–52.0)
HEMOGLOBIN: 11.6 g/dL — AB (ref 13.0–17.0)
Immature Granulocytes: 1 %
Lymphocytes Relative: 10 %
Lymphs Abs: 0.8 10*3/uL (ref 0.7–4.0)
MCH: 29.1 pg (ref 26.0–34.0)
MCHC: 33 g/dL (ref 30.0–36.0)
MCV: 88.2 fL (ref 80.0–100.0)
MONO ABS: 0.5 10*3/uL (ref 0.1–1.0)
Monocytes Relative: 6 %
Neutro Abs: 6.1 10*3/uL (ref 1.7–7.7)
Neutrophils Relative %: 82 %
PLATELETS: 338 10*3/uL (ref 150–400)
RBC: 3.99 MIL/uL — AB (ref 4.22–5.81)
RDW: 13.4 % (ref 11.5–15.5)
WBC: 7.5 10*3/uL (ref 4.0–10.5)
nRBC: 0 % (ref 0.0–0.2)

## 2017-12-12 LAB — I-STAT CG4 LACTIC ACID, ED
LACTIC ACID, VENOUS: 0.91 mmol/L (ref 0.5–1.9)
LACTIC ACID, VENOUS: 0.86 mmol/L (ref 0.5–1.9)

## 2017-12-12 MED ORDER — AMLODIPINE BESYLATE 5 MG PO TABS
5.0000 mg | ORAL_TABLET | Freq: Every day | ORAL | Status: DC
  Administered 2017-12-12 – 2017-12-13 (×2): 5 mg via ORAL
  Filled 2017-12-12 (×2): qty 1

## 2017-12-12 MED ORDER — VITAMIN D 1000 UNITS PO TABS
1000.0000 [IU] | ORAL_TABLET | Freq: Every day | ORAL | Status: DC
  Administered 2017-12-13 – 2017-12-14 (×2): 1000 [IU] via ORAL
  Filled 2017-12-12 (×2): qty 1

## 2017-12-12 MED ORDER — FUROSEMIDE 40 MG PO TABS
40.0000 mg | ORAL_TABLET | Freq: Every day | ORAL | Status: DC
  Administered 2017-12-13 – 2017-12-14 (×2): 40 mg via ORAL
  Filled 2017-12-12 (×2): qty 1

## 2017-12-12 MED ORDER — SODIUM CHLORIDE 0.9 % IV SOLN
1.0000 g | INTRAVENOUS | Status: DC
  Filled 2017-12-12: qty 10

## 2017-12-12 MED ORDER — CARVEDILOL 25 MG PO TABS
25.0000 mg | ORAL_TABLET | Freq: Two times a day (BID) | ORAL | Status: DC
  Administered 2017-12-13 – 2017-12-14 (×3): 25 mg via ORAL
  Filled 2017-12-12 (×3): qty 1

## 2017-12-12 MED ORDER — SODIUM CHLORIDE 0.9 % IV SOLN
1.0000 g | Freq: Once | INTRAVENOUS | Status: AC
  Administered 2017-12-12: 1 g via INTRAVENOUS
  Filled 2017-12-12: qty 10

## 2017-12-12 MED ORDER — CARVEDILOL 12.5 MG PO TABS: 12.5000 mg | ORAL_TABLET | Freq: Two times a day (BID) | ORAL | Status: DC

## 2017-12-12 MED ORDER — ACETAMINOPHEN 325 MG PO TABS
650.0000 mg | ORAL_TABLET | Freq: Four times a day (QID) | ORAL | Status: DC | PRN
  Administered 2017-12-14: 650 mg via ORAL
  Filled 2017-12-12: qty 2

## 2017-12-12 MED ORDER — ASPIRIN 81 MG PO CHEW
81.0000 mg | CHEWABLE_TABLET | Freq: Every day | ORAL | Status: DC
  Administered 2017-12-13 – 2017-12-14 (×2): 81 mg via ORAL
  Filled 2017-12-12 (×2): qty 1

## 2017-12-12 MED ORDER — SPIRONOLACTONE 25 MG PO TABS
100.0000 mg | ORAL_TABLET | Freq: Every day | ORAL | Status: DC
  Administered 2017-12-13 – 2017-12-14 (×2): 100 mg via ORAL
  Filled 2017-12-12 (×2): qty 4

## 2017-12-12 MED ORDER — VANCOMYCIN HCL 10 G IV SOLR
2500.0000 mg | Freq: Once | INTRAVENOUS | Status: AC
  Administered 2017-12-12: 2500 mg via INTRAVENOUS
  Filled 2017-12-12: qty 2500

## 2017-12-12 MED ORDER — HEPARIN SODIUM (PORCINE) 5000 UNIT/ML IJ SOLN
5000.0000 [IU] | Freq: Three times a day (TID) | INTRAMUSCULAR | Status: DC
  Administered 2017-12-12 – 2017-12-14 (×5): 5000 [IU] via SUBCUTANEOUS
  Filled 2017-12-12 (×5): qty 1

## 2017-12-12 MED ORDER — CALCIFEDIOL ER 30 MCG PO CPCR: 30.0000 ug | ORAL_CAPSULE | Freq: Every day | ORAL | Status: DC

## 2017-12-12 NOTE — ED Provider Notes (Signed)
[facility] EMERGENCY DEPARTMENT Provider Note   CSN: [id number] Arrival date & time: 12/12/17  1128     History   Chief Complaint Chief Complaint  Patient presents with  . Leg Swelling    HPI Johnny [name] is a 57 y.o. male.  HPI   Patient is a 57 year old male with a history of hypertension, CKD, DVT/PE, who presents emergency department today for evaluation of right lower extremity swelling, redness, and warmth that has been present for the last week.  Also with pain to the area.  No fevers at home but has had chills.  Has a history of cellulitis to the right lower extremity and thinks that his symptoms are consistent with recurrent cellulitis.  Has had no drainage from the wound.  Denies chest pain, shortness of breath, hemoptysis, recent surgery/trauma, recent admission to the hospital, hormone use, personal hx of cancer, or hx of DVT/PE.    Past Medical History:  Diagnosis Date  . Diverticulosis of large intestine without hemorrhage 01/31/2012  . Essential HTN 04/21/2006  . Hypertension   . OSA 05/21/2013  . 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 Cellulitis of right thigh 12/12/2017  . Hyperkalemia 08/27/2015  . Hyponatremia 08/27/2015  . Cellulitis of left lower extremity without foot 08/27/2015  . OSA 05/21/2013  . Severe obesity (BMI >= 40) ([place]) 12/02/2011  . Venous stasis of lower extremity 12/02/2011  . LVH 07/28/2009  . Stage III CKD 07/28/2009  . Essential HTN 04/21/2006    Past Surgical History:  Procedure Laterality Date  . COLONOSCOPY  01/31/2012   Procedure: COLONOSCOPY;  Surgeon: Inda Castle, MD;  Location: [facility] ENDOSCOPY;  Service: Endoscopy;  Laterality: N/A;  bmi is 44  . NO PAST SURGERIES          Home Medications    Prior to Admission medications   Medication Sig Start Date End Date Taking? Authorizing Provider  acetaminophen (TYLENOL) 500 MG tablet Take 1,000 mg by  mouth every 6 (six) hours as needed for mild pain.   Yes [provider]  amLODipine (NORVASC) 5 MG tablet Take 5 mg by mouth at bedtime. 11/21/17  Yes [provider]  aspirin 81 MG chewable tablet Chew 81 mg by mouth daily.     Yes [provider]  carvedilol (COREG) 12.5 MG tablet TAKE ONE TABLET BY MOUTH TWICE DAILY WITH MEALS 12/10/14  Yes Patrecia Pour, MD  cholecalciferol (VITAMIN D) 1000 units tablet Take 1,000 Units by mouth daily.   Yes [provider]  furosemide (LASIX) 40 MG tablet Take 1 tablet (40 mg total) by mouth daily. 10/22/15  Yes [place] Bing, DO  hydrochlorothiazide (HYDRODIURIL) 25 MG tablet TAKE ONE TABLET BY MOUTH  DAILY 03/24/15  Yes Patrecia Pour, MD  RAYALDEE 30 MCG CPCR Take 30 mcg by mouth daily. 08/14/15  Yes [provider]  spironolactone (ALDACTONE) 100 MG tablet Take 100 mg by mouth daily. 11/30/17  Yes [provider]  carvedilol (COREG) 25 MG tablet  06/21/17   [provider]  doxycycline (VIBRA-TABS) 100 MG tablet Take 1 tablet (100 mg total) by mouth 2 (two) times daily. Patient not taking: Reported on 12/12/2017 08/20/15   Rogue Bussing, MD  levofloxacin (LEVAQUIN) 750 MG tablet Take 1 tablet (750 mg total) by mouth every other day. Patient not taking: Reported on 12/12/2017 10/07/15   Waynetta Pean, PA-C  sodium polystyrene (  KAYEXALATE) powder Take by mouth 2 (two) times daily. Take 30 mg PO BID - Dispense QS x 3 days Patient not taking: Reported on 12/12/2017 08/28/15   Alveda Reasons, MD    Family History Family History  Problem Relation Age of Onset  . Breast cancer Mother   . Hypertension Mother   . Colon cancer Father        died at 3  . Hypertension Brother     Social History Social History   Tobacco Use  . Smoking status: Never Smoker  . Smokeless tobacco: Never Used  Substance Use Topics  . Alcohol use: Not Currently    Comment: social-once a week  . Drug  use: No    Comment: stopped marijuana about Sep or Oct 2013     Allergies   Lisinopril   Review of Systems Review of Systems  Constitutional: Positive for chills. Negative for fever.  HENT: Negative for congestion.   Eyes: Negative for visual disturbance.  Respiratory: Negative for cough and shortness of breath.   Cardiovascular: Positive for leg swelling. Negative for chest pain.  Gastrointestinal: Negative for abdominal pain, constipation, diarrhea, nausea and vomiting.  Genitourinary: Negative for flank pain.  Musculoskeletal: Negative for back pain.  Skin: Positive for color change.       Redness/warmth/pain to RLE  Neurological: Negative for headaches.    Physical Exam Updated Vital Signs BP (!) 165/99   Pulse 63   Temp 97.8 F (36.6 C) (Oral)   Resp 14   Ht 5\' 9"  (1.753 m)   Wt (!) 142.9 kg   SpO2 97%   BMI 46.52 kg/m   Physical Exam  Constitutional: He appears well-developed and well-nourished.  HENT:  Head: Normocephalic and atraumatic.  Eyes: Conjunctivae are normal.  Neck: Neck supple.  Cardiovascular: Normal rate, regular rhythm and normal heart sounds.  Pulmonary/Chest: Effort normal and breath sounds normal. No respiratory distress.  Abdominal: Soft. There is no tenderness.  Morbidly obese  Musculoskeletal:  2+ pitting edema to left thigh, 3+ pitting edema to right medial thigh with warmth, erythema, and mild TTP. No significant drainage from area. No significant swelling to remainder of RLE and no right calf TTP.   Neurological: He is alert.  Skin: Skin is warm and dry.  Psychiatric: He has a normal mood and affect.  Nursing note and vitals reviewed.        ED Treatments / Results  Labs (all labs ordered are listed, but only abnormal results are displayed) Labs Reviewed  COMPREHENSIVE METABOLIC PANEL - Abnormal; Notable for the following components:      Result Value   Glucose, Bld 109 (*)    BUN 55 (*)    Creatinine, Ser 4.06 (*)     Albumin 2.7 (*)    AST 14 (*)    GFR calc non Af Amer 15 (*)    GFR calc Af Amer 18 (*)    All other components within normal limits  CBC WITH DIFFERENTIAL/PLATELET - Abnormal; Notable for the following components:   RBC 3.99 (*)    Hemoglobin 11.6 (*)    HCT 35.2 (*)    All other components within normal limits  I-STAT CHEM 8, ED - Abnormal; Notable for the following components:   Potassium 6.9 (*)    BUN 81 (*)    Creatinine, Ser 4.50 (*)    Glucose, Bld 108 (*)    Calcium, Ion 0.92 (*)    TCO2 33 (*)  Hemoglobin 11.2 (*)    HCT 33.0 (*)    All other components within normal limits  I-STAT CHEM 8, ED - Abnormal; Notable for the following components:   BUN 53 (*)    Creatinine, Ser 4.50 (*)    Glucose, Bld 102 (*)    Calcium, Ion 1.05 (*)    TCO2 33 (*)    Hemoglobin 11.6 (*)    HCT 34.0 (*)    All other components within normal limits  I-STAT CG4 LACTIC ACID, ED  I-STAT CG4 LACTIC ACID, ED  I-STAT CG4 LACTIC ACID, ED    EKG None  Radiology Ct Femur Right Wo Contrast  Result Date: 12/12/2017 CLINICAL DATA:  Right thigh swelling with fever and chills EXAM: CT OF THE LOWER RIGHT EXTREMITY WITHOUT CONTRAST TECHNIQUE: Multidetector CT imaging of the right lower extremity was performed according to the standard protocol. COMPARISON:  None. FINDINGS: Bones/Joint/Cartilage No fracture or malalignment. No periostitis or bone destruction. Patellofemoral degenerative change. Moderate degenerative change at the knee. No significant hip effusion. Ligaments Suboptimally assessed by CT. Muscles and Tendons No muscular fatty atrophy. Soft tissues Large fluid collection in the scrotum. Large pedunculated fatty mass within the inter upper thigh with diffuse skin thickening and extensive edematous infiltration but without well-defined rim enhancing fluid collection. Much smaller contralateral inner upper right thigh fatty enlargement with skin thickening and edema. Multiple enlarged right  greater than left inguinal lymph nodes measuring up to 2.1 cm. IMPRESSION: 1. Massive pedunculated fatty appearing enlargement of the inner upper thigh soft tissues. There is marked skin thickening and severe diffuse edema/infiltrated appearance of the fatty mass but without well-defined rim enhancing fluid collection to suggest focal abscess. 2. No acute osseous abnormality 3. Large scrotal fluid collection 4. Inguinal adenopathy. Electronically Signed   By: Donavan Foil M.D.   On: 12/12/2017 20:39    Procedures Procedures (including critical care time)  Medications Ordered in ED Medications  cefTRIAXone (ROCEPHIN) 1 g in sodium chloride 0.9 % 100 mL IVPB (0 g Intravenous Stopped 12/12/17 1939)  vancomycin (VANCOCIN) 2,500 mg in sodium chloride 0.9 % 500 mL IVPB (2,500 mg Intravenous New Bag/Given 12/12/17 2016)     Initial Impression / Assessment and Plan / ED Course  I have reviewed the triage vital signs and the nursing notes.  Pertinent labs & imaging results that were available during my care of the patient were reviewed by me and considered in my medical decision making (see chart for details).   Final Clinical Impressions(s) / ED Diagnoses   Final diagnoses:  Cellulitis of right lower extremity  Chronic kidney disease, unspecified CKD stage   Patient with right lower extremity swelling, warmth and redness for the last week.  No fevers or chills at home.  Slightly hypertensive today but otherwise normal vital signs.  No signs of sepsis today.  No leukocytosis.  CMP with elevated creatinine, consistent with baseline.  Electrolytes are within normal limits on CMP.  Elevated potassium on i-STAT Chem-8, however feel this is spurious as CMP and repeat i-STAT Chem-8 have normal potassium.  Lactic acid negative.  CT of the right lower extremity was completed to further characterize the patient's swelling and edema which showed massive pedunculated fatty appearing enlargement of the inner  upper thigh soft tissues with marked thickening and severe edema/infiltrated appearance without well-defined abscess.  Is likely secondary to extensive cellulitis.  Antibiotics have been ordered in the ED and we will plan for admission to the hospitalist service for  further treatment and monitoring.  Patient seen in conjunction with Dr. Venora Maples who personally evaluated the patient and agrees with the plan for admission.  9:56 PM CONSULT with Dr. Enid Derry from family medicine residency team who accepts the pt for admission.  ED Discharge Orders    None       Bishop Dublin 12/12/17 2218    Jola Schmidt, MD 12/12/17 2307

## 2017-12-12 NOTE — H&P (Addendum)
[facility] Admission History and Physical Service Pager: [phone]  [name] Medical record number: [id number] Date of birth: Nov 16, 1960 Age: 57 y.o. Gender: male  Primary Care Provider: [place] Bing, DO Consultants: none Code Status: Full (confirmed in ED)  Chief Complaint: leg swelling  Assessment and Plan: Johnny [name] is a 57 y.o. male presenting with leg swelling. PMH is significant for CKD stage IV, HTN, venous stasis, LVH.   R thigh swelling with possible cellulitis: Patient reports that the swelling, heat and erythema began 1 week ago. Reports he has had similar swelling in the past on L leg and per chart review was treated with bactrim and keflex at that time with success. Patient with h/o 85 lb weight loss with excess skin and chronic lymphedema. R thigh swelling appears to be related to fluid collection in excess skin with possible cellulitis. CT of R leg shows Massive pedunculated fatty appearing enlargement of inner upper thigh soft tissues w/ marked skin thickening and severe diffuse edema/infiltrated appearance of fatty mass but no well-defined rim enhancing fluid collection to suggest focal abscess. No bone abnormality. Large scrotal fluid collection. Inguinal adenopathy. Per nephro note in 09/2017 patient with chronic lymphedema of L>R leg. Patient on spiro and lasix to help control this swelling. Patient reports last fever was about a week ago; denies tenderness or pain. Antibiotics vanc and CTX started in ED for cellulitis, no signs of bacteremia and vitals stable. Skin thickening could be due to venous stasis vs panniculitis vs myositis vs calciphylaxis, unlikely DVT based on clinical picture.  - Admit to med-surg, attending Dr. McDiarmid - Consider MRI for further eval of soft tissue mass - Consider surgical consult for excess skin and fatty mass - Continue vanc and CTX and de-escalate as able - Tylenol for pain - Vitals  per routine  Large scrotal fluid collection: Seen on CT of R leg. Patient denies this and is not tender. Could be due to leg mass or hydrocele.  - Consider [place] to further evaluate swelling if no improvement.    CKD, stage IV: Patient seen by vascular surgery to have fistula placed for HD access in 07/2017; last seen by [facility] 09/2017.  - Trend BMP / urinary output - Replace electrolytes as indicated - Avoid nephrotoxic agents, ensure adequate renal perfusion - Consider nephrology consult - Continue Rayaldee  Hypertension: Takes Lasix 40 mg, carvedilol 25 mg BID, norvasc 5 mg (recently started in 09/2017 by nephro for HTN), spironolactone 100 mg. Reports compliance with medications and last took this am. BP 137/92 on admission. 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 Hgb 11.6 today. BL ~ 11.5.  - Monitor  LVH: Seen on Echo in 2009, EF 55-60%. Patient on coreg, spiro. - Consider repeat ECHO if indicated  OSA: Patient reports that this is no longer a problem fr him as he has lost over 85 lbs and he does not use CPAP at home. - Offer CPAP if patient apneic at night.   FEN/GI: Renal/heart healthy diet Prophylaxis: Heparin  Disposition: Admit to med-surg  History of Present Illness:  Johnny [name] is a 57 y.o. male presenting with R thigh swelling. Patient reports he had a cold last week. After cold improved somewhat, his leg started to feel warm and started swelling. Patient placed ice on his leg to cool it and it did not help. He  also took some tylenol for the heat but did not. He reports that at first he did not see it, but that he could feel it when he walked. Denies scrotal pain, or leg pain. Reports his skin feels painful.   He has had this happen before when he had cellulitis. Patient reports he works out and goes to the [employer] at [employer] and that's what he was told happened  the first time about a year ago. (2017 per chart review). Patient reports that he normally walks 2 miles per day but has not been able to do that.   Patient does have a h/o a large amount of weight loss with excess skin which has caused local swelling in the past.   Review Of Systems: Per HPI with the following additions:   Review of Systems  Constitutional: Negative for chills and fever.  HENT: Negative for congestion and sore throat.   Respiratory: Negative for cough and shortness of breath.   Cardiovascular: Positive for leg swelling. Negative for chest pain.  Gastrointestinal: Negative for constipation and diarrhea.  Genitourinary: Negative for dysuria and frequency.    Patient Active Problem List   Diagnosis Date Noted  . Hyperkalemia 08/27/2015  . Hyponatremia 08/27/2015  . Cellulitis of left lower extremity without foot 08/27/2015  . OSA 05/21/2013  . Severe obesity (BMI >= 40) ([place]) 12/02/2011  . Venous stasis of lower extremity 12/02/2011  . LVH 07/28/2009  . Stage III CKD 07/28/2009  . Essential HTN 04/21/2006    Past Medical History: Past Medical History:  Diagnosis Date  . Diverticulosis of large intestine without hemorrhage 01/31/2012  . Essential HTN 04/21/2006  . Hypertension   . OSA 05/21/2013  . 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 COLONOSCOPY  01/31/2012   Procedure: COLONOSCOPY;  Surgeon: Inda Castle, MD;  Location: [facility] ENDOSCOPY;  Service: Endoscopy;  Laterality: N/A;  bmi is 69  . NO PAST SURGERIES      Social History: Social History   Tobacco Use  . Smoking status: Never Smoker  . Smokeless tobacco: Never Used  Substance Use Topics  . Alcohol use: Not Currently    Comment: social-once a week  . Drug use: No    Comment: stopped marijuana about Sep or Oct 2013   Additional social history: see above Please also refer to relevant sections of EMR.  Family  History: Family History  Problem Relation Age of Onset  . Breast cancer Mother   . Hypertension Mother   . Colon cancer Father        died at 24  . Hypertension Brother     Allergies and Medications: Allergies  Allergen Reactions  . Lisinopril Swelling    Angioedema   No current facility-administered medications on file prior to encounter.    Current Outpatient Medications on File Prior to Encounter  Medication Sig Dispense Refill  . acetaminophen (TYLENOL) 500 MG tablet Take 1,000 mg by mouth every 6 (six) hours as needed for mild pain.    Marland Kitchen amLODipine (NORVASC) 5 MG tablet Take 5 mg by mouth at bedtime.  6  . aspirin 81 MG chewable tablet Chew 81 mg by mouth daily.      . carvedilol (COREG) 12.5 MG tablet TAKE ONE TABLET BY MOUTH TWICE DAILY WITH MEALS 180 tablet 0  . cholecalciferol (VITAMIN D) 1000 units tablet Take 1,000 Units by mouth daily.    . furosemide (  LASIX) 40 MG tablet Take 1 tablet (40 mg total) by mouth daily. 10 tablet 0  . hydrochlorothiazide (HYDRODIURIL) 25 MG tablet TAKE ONE TABLET BY MOUTH  DAILY 90 tablet 0  . RAYALDEE 30 MCG CPCR Take 30 mcg by mouth daily.  3  . spironolactone (ALDACTONE) 100 MG tablet Take 100 mg by mouth daily.  9  . carvedilol (COREG) 25 MG tablet   12  . doxycycline (VIBRA-TABS) 100 MG tablet Take 1 tablet (100 mg total) by mouth 2 (two) times daily. (Patient not taking: Reported on 12/12/2017) 10 tablet 0  . levofloxacin (LEVAQUIN) 750 MG tablet Take 1 tablet (750 mg total) by mouth every other day. (Patient not taking: Reported on 12/12/2017) 4 tablet 0  . sodium polystyrene (KAYEXALATE) powder Take by mouth 2 (two) times daily. Take 30 mg PO BID - Dispense QS x 3 days (Patient not taking: Reported on 12/12/2017) 454 g 0    Objective: BP (!) 164/90   Pulse 63   Temp 97.8 F (36.6 C) (Oral)   Resp 14   Ht 5\' 9"  (1.753 m)   Wt (!) 142.9 kg   SpO2 97%   BMI 46.52 kg/m  Exam: General: NAD, pleasant Eyes: PERRL, EOMI, no  conjunctival pallor or injection ENTM: Moist mucous membranes, no pharyngeal erythema or exudate Neck: Supple, no LAD Cardiovascular: RRR, no m/r/g Respiratory: CTA BL, normal work of breathing Gastrointestinal: soft, nontender, nondistended, normoactive BS MSK: R upper thigh pictured below. Large, fluid-filled thigh mass with warmth and erythema and small amount of weeping, some induration felt, no fluctuance. Nontender. L leg with with venous stasis and evidence of excess skin and nonpitting edema.  Derm: no rashes appreciated Neuro: CN II-XII grossly intact Psych: AOx3, appropriate affect     Labs and Imaging: CBC BMET  Recent Labs  Lab 12/12/17 1152  12/12/17 1354  WBC 7.5  --   --   HGB 11.6*   < > 11.6*  HCT 35.2*   < > 34.0*  PLT 338  --   --    < > = values in this interval not displayed.   Recent Labs  Lab 12/12/17 1152  12/12/17 1354  NA 138   < > 137  K 4.8   < > 5.1  CL 99   < > 99  CO2 27  --   --   BUN 55*   < > 53*  CREATININE 4.06*   < > 4.50*  GLUCOSE 109*   < > 102*  CALCIUM 9.0  --   --    < > = values in this interval not displayed.     Ct Femur Right Wo Contrast  Result Date: 12/12/2017 CLINICAL DATA:  Right thigh swelling with fever and chills EXAM: CT OF THE LOWER RIGHT EXTREMITY WITHOUT CONTRAST TECHNIQUE: Multidetector CT imaging of the right lower extremity was performed according to the standard protocol. COMPARISON:  None. FINDINGS: Bones/Joint/Cartilage No fracture or malalignment. No periostitis or bone destruction. Patellofemoral degenerative change. Moderate degenerative change at the knee. No significant hip effusion. Ligaments Suboptimally assessed by CT. Muscles and Tendons No muscular fatty atrophy. Soft tissues Large fluid collection in the scrotum. Large pedunculated fatty mass within the inter upper thigh with diffuse skin thickening and extensive edematous infiltration but without well-defined rim enhancing fluid collection. Much  smaller contralateral inner upper right thigh fatty enlargement with skin thickening and edema. Multiple enlarged right greater than left inguinal lymph nodes measuring up to  2.1 cm. IMPRESSION: 1. Massive pedunculated fatty appearing enlargement of the inner upper thigh soft tissues. There is marked skin thickening and severe diffuse edema/infiltrated appearance of the fatty mass but without well-defined rim enhancing fluid collection to suggest focal abscess. 2. No acute osseous abnormality 3. Large scrotal fluid collection 4. Inguinal adenopathy. Electronically Signed   By: Donavan Foil M.D.   On: 12/12/2017 20:39    Shellby Schlink, [place], DO 12/12/2017, 10:01 PM PGY-2, [place] Intern pager: [phone], text pages welcome

## 2017-12-12 NOTE — Progress Notes (Signed)
Pharmacy Antibiotic Note  Jeffree Cazeau is a 57 y.o. male admitted on 12/12/2017 with cellulitis.  Pharmacy has been consulted for vancomycin dosing. Vancomycin 2500 mg given earlier today.  He is CKD stage IV and has not started dialysis yet  Plan: Check random vancomycin level in ~3 days for redose  Height: 5\' 9"  (175.3 cm) Weight: (!) 315 lb (142.9 kg) IBW/kg (Calculated) : 70.7  Temp (24hrs), Avg:98.3 F (36.8 C), Min:97.8 F (36.6 C), Max:98.7 F (37.1 C)  Recent Labs  Lab 12/12/17 1152 12/12/17 1157 12/12/17 1200 12/12/17 1354 12/12/17 1356  WBC 7.5  --   --   --   --   CREATININE 4.06*  --  4.50* 4.50*  --   LATICACIDVEN  --  0.91  --   --  0.86    Estimated Creatinine Clearance: 25.8 mL/min (A) (by C-G formula based on SCr of 4.5 mg/dL (H)).    Allergies  Allergen Reactions  . Lisinopril Swelling    Angioedema     Thank you for allowing pharmacy to be a part of this patient's care.  Excell Seltzer Poteet 12/12/2017 11:39 PM

## 2017-12-12 NOTE — ED Notes (Signed)
Patient transported to CT 

## 2017-12-12 NOTE — ED Triage Notes (Signed)
Right leg pain and swelling, started over the weekend. No injury to report, states he had fever and chills over the weekend, also had a cold. Large swollen site red, hot, tender to touch. Pt has Hx cellulitis in this same spot and thinks it is reoccurring.

## 2017-12-13 ENCOUNTER — Inpatient Hospital Stay (HOSPITAL_COMMUNITY): Payer: Medicaid Other

## 2017-12-13 ENCOUNTER — Encounter (HOSPITAL_COMMUNITY): Payer: Self-pay | Admitting: Family Medicine

## 2017-12-13 DIAGNOSIS — N5089 Other specified disorders of the male genital organs: Secondary | ICD-10-CM | POA: Diagnosis present

## 2017-12-13 DIAGNOSIS — N3289 Other specified disorders of bladder: Secondary | ICD-10-CM

## 2017-12-13 DIAGNOSIS — I878 Other specified disorders of veins: Secondary | ICD-10-CM

## 2017-12-13 DIAGNOSIS — I890 Lymphedema, not elsewhere classified: Secondary | ICD-10-CM

## 2017-12-13 HISTORY — DX: Lymphedema, not elsewhere classified: I89.0

## 2017-12-13 LAB — POCT I-STAT, CHEM 8
BUN: 81 mg/dL — AB (ref 6–20)
CALCIUM ION: 0.92 mmol/L — AB (ref 1.15–1.40)
CREATININE: 4.5 mg/dL — AB (ref 0.61–1.24)
Chloride: 101 mmol/L (ref 98–111)
GLUCOSE: 108 mg/dL — AB (ref 70–99)
HCT: 33 % — ABNORMAL LOW (ref 39.0–52.0)
HEMOGLOBIN: 11.2 g/dL — AB (ref 13.0–17.0)
Potassium: 6.9 mmol/L (ref 3.5–5.1)
Sodium: 135 mmol/L (ref 135–145)
TCO2: 33 mmol/L — AB (ref 22–32)

## 2017-12-13 LAB — BASIC METABOLIC PANEL
Anion gap: 11 (ref 5–15)
BUN: 51 mg/dL — AB (ref 6–20)
CALCIUM: 8.9 mg/dL (ref 8.9–10.3)
CHLORIDE: 99 mmol/L (ref 98–111)
CO2: 25 mmol/L (ref 22–32)
CREATININE: 3.75 mg/dL — AB (ref 0.61–1.24)
GFR calc non Af Amer: 17 mL/min — ABNORMAL LOW (ref >60)
GFR, EST AFRICAN AMERICAN: 19 mL/min — AB (ref >60)
GLUCOSE: 120 mg/dL — AB (ref 70–99)
Potassium: 5 mmol/L (ref 3.5–5.1)
Sodium: 135 mmol/L (ref 135–145)

## 2017-12-13 LAB — CBC
HCT: 32.3 % — ABNORMAL LOW (ref 39.0–52.0)
Hemoglobin: 10.9 g/dL — ABNORMAL LOW (ref 13.0–17.0)
MCH: 29.5 pg (ref 26.0–34.0)
MCHC: 33.7 g/dL (ref 30.0–36.0)
MCV: 87.5 fL (ref 80.0–100.0)
NRBC: 0 % (ref 0.0–0.2)
Platelets: 328 10*3/uL (ref 150–400)
RBC: 3.69 MIL/uL — ABNORMAL LOW (ref 4.22–5.81)
RDW: 13.3 % (ref 11.5–15.5)
WBC: 6.9 10*3/uL (ref 4.0–10.5)

## 2017-12-13 LAB — HIV ANTIBODY (ROUTINE TESTING W REFLEX): HIV Screen 4th Generation wRfx: NONREACTIVE

## 2017-12-13 MED ORDER — INFLUENZA VAC SPLIT QUAD 0.5 ML IM SUSY
0.5000 mL | PREFILLED_SYRINGE | INTRAMUSCULAR | Status: AC
  Administered 2017-12-14: 0.5 mL via INTRAMUSCULAR
  Filled 2017-12-13: qty 0.5

## 2017-12-13 MED ORDER — CEFAZOLIN SODIUM-DEXTROSE 2-4 GM/100ML-% IV SOLN
2.0000 g | Freq: Two times a day (BID) | INTRAVENOUS | Status: DC
  Administered 2017-12-13 (×2): 2 g via INTRAVENOUS
  Filled 2017-12-13 (×3): qty 100

## 2017-12-13 MED ORDER — VANCOMYCIN VARIABLE DOSE PER UNSTABLE RENAL FUNCTION (PHARMACIST DOSING): Status: DC

## 2017-12-13 NOTE — Progress Notes (Signed)
Family Medicine Teaching Service Daily Progress Note Intern Pager: [phone]  [name] Medical record number: [id number] Date of birth: 1961-02-11 Age: 57 y.o. Gender: male  Primary Care Provider: [place] Bing, DO Consultants: gen surg Code Status: full  Pt Overview and Major Events to Date:  10/21 admitted  Assessment and Plan:  Right thigh edema- patient's thigh remains edematous. Non erythematous or warmer than other leg. Since prior admission for same problem patient received keflex and swelling resolved, will continue antibiotic treatment and can switch to keflex outpatient. With concern for rapid expansion in about 1 week time, concern for sarcoma- consulted general surgery- not planning to see patient. Will consult plastic surgery for outpatient management. - start IV cefazolin today 1g BID - consider changing to keflex for outpatient management - consulted general surgery - amb consult to plastic surgery - amb consult with [facility] ([place], [facility] lymphedema [facility], [facility]) - PT/OT eval  Scrotal swelling- patient denies any pain or changes today -f/u scrotal US results  CKD, stage IV: Patient seen by vascular surgery to have fistula placed for HD access in 07/2017; last seen by [facility] 09/2017.  - Trend BMP / urinary output - Replace electrolytes as indicated - Avoid nephrotoxic agents, ensure adequate renal perfusion - Continue Rayaldee  Hypertension: BP today is 137/81. Home meds: Lasix 40 mg, carvedilol 25 mg BID, norvasc 5 mg (recently started in 09/2017 by nephro for HTN), spironolactone 100 mg. 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 Hgb 10.9 today. BL ~ 11.5.  - CBC am  OSA: Patient reports that this is no longer a problem for him as he has  lost over 85 lbs and he does not use CPAP at home. - Offer CPAP if patient apneic at night.   FEN/GI: Renal/heart healthy diet Prophylaxis: Heparin  Disposition: continue inpatient for IV antibiotics, possible discharge tomorrow  Subjective:  Overnight: no acute events  Today: patient feels okay. His thigh is still swollen and has occasional burning pain.  Objective: Temp:  [97.8 F (36.6 C)-98.7 F (37.1 C)] 98.6 F (37 C) (10/22 0841) Pulse Rate:  [63-79] 78 (10/22 0841) Resp:  [14-20] 19 (10/22 0841) BP: (136-169)/(81-99) 137/81 (10/22 0841) SpO2:  [95 %-100 %] 95 % (10/22 0841) Weight:  [142.9 kg] 142.9 kg (10/21 1140)   Physical Exam: General: well-appearing  Cardiovascular: RRR, no murmur appreciated Respiratory: CTAB, no increased WOB Abdomen: obese, no tenderness to palpation Extremities: bilateral lower extremity edema. Right leg 2+ pitting edema. Left leg 1+ edema. Left thigh 1+ pitting edema with excessive loose skin- no rashes or lesions. Right thigh severe edema with indurated skin changes. No erythema, or rashes. Both thighs are same temperature.   Laboratory: Recent Labs  Lab 12/12/17 1152 12/12/17 1200 12/12/17 1354 12/13/17 0541  WBC 7.5  --   --  6.9  HGB 11.6* 11.2*  11.2* 11.6* 10.9*  HCT 35.2* 33.0*  33.0* 34.0* 32.3*  PLT 338  --   --  328   Recent Labs  Lab 12/12/17 1152 12/12/17 1200 12/12/17 1354 12/13/17 0541  NA 138 135  135 137 135  K 4.8 6.9*  6.9* 5.1 5.0  CL 99 101  101 99 99  CO2 27  --   --  25  BUN 55* 81*  81* 53* 51*  CREATININE 4.06* 4.50*  4.50* 4.50* 3.75*  CALCIUM 9.0  --   --  8.9  PROT 7.7  --   --   --   BILITOT 0.5  --   --   --   ALKPHOS 47  --   --   --   ALT 12  --   --   --   AST 14*  --   --   --   GLUCOSE 109* 108*  108* 102* 120*   Imaging/Diagnostic Tests: Scrotal US 1. Large bilateral hydroceles. 2. Unremarkable appearance of the testicles. 3. Left varicocele.  Richarda Osmond,  DO 12/13/2017, 11:31 AM PGY-1, [place] Intern pager: [phone], text pages welcome

## 2017-12-13 NOTE — Plan of Care (Signed)
  Problem: Safety: Goal: Ability to remain free from injury will improve Outcome: Progressing   

## 2017-12-13 NOTE — Evaluation (Signed)
Physical Therapy Evaluation and Discharge Patient Details Name: Johnny [name] MRN: [id number] DOB: September 22, 1960 Today's Date: 12/13/2017   History of Present Illness  Johnny [name] is a 57 y.o. male presenting with leg swelling. PMH is significant for CKD stage IV, HTN, venous stasis, LVH. R thigh swelling appears to be related to fluid colletion in excess skin with possible cellulitis. Pt also with chronic lymphedema L > R.  Clinical Impression  Pt admitted with above. Pt cont to mobilize safely and independently despite large R thigh mass. Pt was doing squats in room upon PTA arrival. Pt can amb and complete stair negotiation safely and independently. Ambulation distance limited by onset of R thigh pain and rubbing of mass. Pt with no further acute PT needs at this time. PT SIGNING OFF. Please re-consult if needed in the future.    Follow Up Recommendations No PT follow up    Equipment Recommendations  None recommended by PT    Recommendations for Other Services       Precautions / Restrictions Precautions Precautions: Other (comment) Precaution Comments: R large thigh mass Restrictions Weight Bearing Restrictions: No      Mobility  Bed Mobility Overal bed mobility: Independent             General bed mobility comments: was sitting EOB upon PT arrival  Transfers Overall transfer level: Independent Equipment used: None             General transfer comment: pt was doing squats when PT  Ambulation/Gait Ambulation/Gait assistance: Modified independent (Device/Increase time) Gait Distance (Feet): 200 Feet Assistive device: None Gait Pattern/deviations: Wide base of support Gait velocity: wfl Gait velocity interpretation: 1.31 - 2.62 ft/sec, indicative of limited community ambulator General Gait Details: wide base of support due to large R thigh mass. ambulation distance limited by rubbing of mass and onset of pain with mass in dependent position. Pt with no signs  of instability  Stairs Stairs: Yes Stairs assistance: Modified independent (Device/Increase time) Stair Management: One rail Right;Forwards Number of Stairs: 12 General stair comments: ascending reciprocally and descended step to leading with R LE due to minimal knee flexion due to mass  Wheelchair Mobility    Modified Rankin (Stroke Patients Only)       Balance Overall balance assessment: No apparent balance deficits (not formally assessed)                                           Pertinent Vitals/Pain Pain Assessment: 0-10 Pain Score: 3  Pain Location: R hight mass Pain Descriptors / Indicators: Sharp;Shooting Pain Intervention(s): Monitored during session    Home Living Family/patient expects to be discharged to:: Private residence Living Arrangements: Other relatives(sister) Available Help at Discharge: Family;Available PRN/intermittently Type of Home: House Home Access: Stairs to enter Entrance Stairs-Rails: Can reach both Entrance Stairs-Number of Steps: 2 Home Layout: One level Home Equipment: Grab bars - tub/shower Additional Comments: sister works    Prior Function Level of Independence: Architect Dominance   Dominant Hand: Right    Extremity/Trunk Assessment   Upper Extremity Assessment Upper Extremity Assessment: Overall WFL for tasks assessed    Lower Extremity Assessment Lower Extremity Assessment: RLE deficits/detail RLE Deficits / Details: limited by large thigh mass, was doing squats in room    Cervical / Trunk  Assessment Cervical / Trunk Assessment: Normal  Communication   Communication: No difficulties  Cognition Arousal/Alertness: Awake/alert Behavior During Therapy: WFL for tasks assessed/performed Overall Cognitive Status: Within Functional Limits for tasks assessed                                        General Comments General comments (skin integrity, edema,  etc.): pt with large R thigh mass and noted L LE edema    Exercises     Assessment/Plan    PT Assessment Patent does not need any further PT services  PT Problem List         PT Treatment Interventions      PT Goals (Current goals can be found in the Care Plan section)  Acute Rehab PT Goals Patient Stated Goal: get the mass removed PT Goal Formulation: All assessment and education complete, DC therapy    Frequency     Barriers to discharge        Co-evaluation               AM-PAC PT "6 Clicks" Daily Activity  Outcome Measure Difficulty turning over in bed (including adjusting bedclothes, sheets and blankets)?: None Difficulty moving from lying on back to sitting on the side of the bed? : None Difficulty sitting down on and standing up from a chair with arms (e.g., wheelchair, bedside commode, etc,.)?: None Help needed moving to and from a bed to chair (including a wheelchair)?: None Help needed walking in hospital room?: None Help needed climbing 3-5 steps with a railing? : A Little 6 Click Score: 23    End of Session   Activity Tolerance: Patient tolerated treatment well Patient left: in bed;with call bell/phone within reach Nurse Communication: Mobility status PT Visit Diagnosis: Unsteadiness on feet (R26.81)    Time: 6644-0347 PT Time Calculation (min) (ACUTE ONLY): 14 min   Charges:   PT Evaluation $PT Eval Low Complexity: 1 Low          Kittie Plater, PT, DPT Acute Rehabilitation Services Pager #: [phone] Office #: [phone]   [place] Lopes 12/13/2017, 1:56 PM

## 2017-12-13 NOTE — Plan of Care (Signed)
  Problem: Nutrition: Goal: Adequate nutrition will be maintained Outcome: Progressing   Problem: Coping: Goal: Level of anxiety will decrease Outcome: Progressing   Problem: Elimination: Goal: Will not experience complications related to bowel motility Outcome: Progressing   Problem: Pain Managment: Goal: General experience of comfort will improve Outcome: Progressing   Problem: Safety: Goal: Ability to remain free from injury will improve Outcome: Progressing   

## 2017-12-13 NOTE — Progress Notes (Addendum)
Family Medicine Teaching Service Daily Progress Note Intern Pager: [phone]  [name] Medical record number: [id number] Date of birth: 1960/02/26 Age: 57 y.o. Gender: male  Primary Care Provider: [place] Bing, DO Consultants: PT/OT Code Status: Full  Pt Overview and Major Events to Date:  10/21: Admit right thigh swelling concerning for cellulitis, CT RLE c/w severe diffuse edema/infiltration w/o abscess, inguinal adenopathy, scrotal edema 10/22: Scrotal ultrasound c/w large b/l hydroceles and L-varicocele  Assessment and Plan: Johnny [name] is a 57 y.o. male presenting with leg swelling. PMH is significant for CKD stage IV, HTN, venous stasis, LVH.   Edema of right thigh pannus  Lymphedema with venous stasis: Acute on chronic.  Initially concerning for possible cellulitis in ED.  Patient remains without fever and is tolerating Ancef.  There is significant edema which was appreciated on CT without identifiable abscess. - Continue diuresis with home Lasix 40 mg daily - Will need outpatient follow-up with lymphedema clinic - Continue Ancef IV, s/p CTX and vancomycin in the ED x1 day - Checking CBC - Tylenol as needed  Bilateral hydrocele  Left varicocele: Likely chronic.  Likely secondary to underlying lymphedema.  Confirmed with scrotal ultrasound.  No signs of torsion or epididymoorchitis. - Scrotal support - See plan above for lymphedema  CKD stage IV  Hypertension: Chronic.  HTN secondary to underlying CKD.  Remains normotensive.  Currently followed by Dr. Doren Custard with vascular, last seen June 2019 with plans for AVF placement. - Continue home Lasix 40 mg daily, amlodipine 5 mg daily and Spironolactone 100 mg daily, Coreg 12.5 mg twice daily - Continue home aspirin 81 mg daily - Holding home HCTZ 25 mg daily - Checking BMET  LVH: Chronic.  Has underlying lipidemia with known CKD stage IV.  Less likely new onset HF though this is a possibility.  Last echo  February 2009 consistent with EF 55-60% without wall motion abnormalities and moderate LVH with mild left atrial dilatation. - Continue monitoring cardiac status and follow-up outpatient  OSA: Chronic.  Last polysomnogram back in March 2015.  Patient reports not tolerating CPAP at home.  Likely contributing to hypertension and lymphedema. - Would greatly benefit from CPAP and will need to follow-up outpatient  Morbid obesity: Chronic.  BMI 46.5 on admission.  Morbid conditions most notable for lymphedema secondary to renal disease and hypertension. - Will need to follow-up outpatient as patient would greatly benefit from weight loss improve lymphedema  FEN/GI: Renal diet with fluid restriction PPx: Lovenox  Disposition: Transition to oral antibiotics for discharge later this morning.  Subjective:  Patient states he is doing much better this morning.  He denies chest pain or shortness of breath, fevers or chills.  He feels like his swelling has improved a little bit since starting the antibiotics.  He is in agreement with the plan to follow-up outpatient with the lymphedema clinic which she reports not seeing over one year.  Objective: Temp:  [98.2 F (36.8 C)-99.3 F (37.4 C)] 98.3 F (36.8 C) (10/23 0430) Pulse Rate:  [67-78] 74 (10/23 0430) Resp:  [16-20] 20 (10/23 0430) BP: (126-137)/(70-83) 133/70 (10/23 0430) SpO2:  [95 %-100 %] 99 % (10/23 0430) Physical Exam: General: well nourished, well developed, NAD with non-toxic appearance HEENT: normocephalic, atraumatic, moist mucous membranes Cardiovascular: regular rate and rhythm without murmurs, rubs, or gallops Lungs: clear to auscultation bilaterally with normal work of breathing on room air Abdomen: morbidly obese, soft, non-tender, non-distended, normoactive bowel sounds Skin: warm, dry, no  rashes or lesions, cap refill < 2 seconds Extremities: warm and well perfused, normal tone, bilateral 1+ pitting edema lower extremities  with large right thigh pannus without erythema, no bruit or bowel sounds present on pannus  Laboratory: Recent Labs  Lab 12/12/17 1152  12/12/17 1354 12/13/17 0541 12/14/17 0154  WBC 7.5  --   --  6.9 7.2  HGB 11.6*   < > 11.6* 10.9* 10.1*  HCT 35.2*   < > 34.0* 32.3* 29.9*  PLT 338  --   --  328 323   < > = values in this interval not displayed.   Recent Labs  Lab 12/12/17 1152  12/12/17 1354 12/13/17 0541 12/14/17 0154  NA 138   < > 137 135 132*  K 4.8   < > 5.1 5.0 4.9  CL 99   < > 99 99 100  CO2 27  --   --  25 20*  BUN 55*   < > 53* 51* 53*  CREATININE 4.06*   < > 4.50* 3.75* 3.91*  CALCIUM 9.0  --   --  8.9 8.6*  PROT 7.7  --   --   --   --   BILITOT 0.5  --   --   --   --   ALKPHOS 47  --   --   --   --   ALT 12  --   --   --   --   AST 14*  --   --   --   --   GLUCOSE 109*   < > 102* 120* 104*   < > = values in this interval not displayed.   HIV antibody: Nonreactive  Imaging/Diagnostic Tests: CT OF THE LOWER RIGHT EXTREMITY WITHOUT CONTRAST (12/12/2017) IMPRESSION: 1. Massive pedunculated fatty appearing enlargement of the inner upper thigh soft tissues. There is marked skin thickening and severe diffuse edema/infiltrated appearance of the fatty mass but without well-defined rim enhancing fluid collection to suggest focal abscess. 2. No acute osseous abnormality 3. Large scrotal fluid collection 4. Inguinal adenopathy.  ULTRASOUND OF SCROTUM (12/13/2017) IMPRESSION: 1. Large bilateral hydroceles. 2. Unremarkable appearance of the testicles. 3. Left varicocele.    Johnny Bing, DO 12/14/2017, 6:52 AM PGY-3, [place] Intern pager: [phone], text pages welcome

## 2017-12-13 NOTE — Progress Notes (Signed)
  Spoke with Saverio Danker from surgery regarding request for consult due to large mass on patients inner R thigh, she reviewed photo in media tab and imaging and feels there is nothing for surgery to offer and patient would benefit from plastic surgery outpatient.   Lucila Maine, DO PGY-3, [facility] 12/13/2017 11:38 AM

## 2017-12-14 DIAGNOSIS — I129 Hypertensive chronic kidney disease with stage 1 through stage 4 chronic kidney disease, or unspecified chronic kidney disease: Secondary | ICD-10-CM

## 2017-12-14 DIAGNOSIS — N184 Chronic kidney disease, stage 4 (severe): Secondary | ICD-10-CM

## 2017-12-14 DIAGNOSIS — I890 Lymphedema, not elsewhere classified: Secondary | ICD-10-CM

## 2017-12-14 LAB — BASIC METABOLIC PANEL WITH GFR
Anion gap: 12 (ref 5–15)
BUN: 53 mg/dL — ABNORMAL HIGH (ref 6–20)
CO2: 20 mmol/L — ABNORMAL LOW (ref 22–32)
Calcium: 8.6 mg/dL — ABNORMAL LOW (ref 8.9–10.3)
Chloride: 100 mmol/L (ref 98–111)
Creatinine, Ser: 3.91 mg/dL — ABNORMAL HIGH (ref 0.61–1.24)
GFR calc Af Amer: 18 mL/min — ABNORMAL LOW
GFR calc non Af Amer: 16 mL/min — ABNORMAL LOW
Glucose, Bld: 104 mg/dL — ABNORMAL HIGH (ref 70–99)
Potassium: 4.9 mmol/L (ref 3.5–5.1)
Sodium: 132 mmol/L — ABNORMAL LOW (ref 135–145)

## 2017-12-14 LAB — CBC
HCT: 29.9 % — ABNORMAL LOW (ref 39.0–52.0)
Hemoglobin: 10.1 g/dL — ABNORMAL LOW (ref 13.0–17.0)
MCH: 29.5 pg (ref 26.0–34.0)
MCHC: 33.8 g/dL (ref 30.0–36.0)
MCV: 87.4 fL (ref 80.0–100.0)
Platelets: 323 K/uL (ref 150–400)
RBC: 3.42 MIL/uL — ABNORMAL LOW (ref 4.22–5.81)
RDW: 13.5 % (ref 11.5–15.5)
WBC: 7.2 K/uL (ref 4.0–10.5)
nRBC: 0 % (ref 0.0–0.2)

## 2017-12-14 MED ORDER — CARVEDILOL 25 MG PO TABS: 25.0000 mg | ORAL_TABLET | Freq: Two times a day (BID) | ORAL | 0 refills | Status: DC

## 2017-12-14 MED ORDER — DOXYCYCLINE HYCLATE 100 MG PO TABS
100.0000 mg | ORAL_TABLET | Freq: Two times a day (BID) | ORAL | Status: DC
  Administered 2017-12-14: 100 mg via ORAL
  Filled 2017-12-14: qty 1

## 2017-12-14 MED ORDER — DOXYCYCLINE HYCLATE 100 MG PO TABS: 100.0000 mg | ORAL_TABLET | Freq: Two times a day (BID) | ORAL | 0 refills | Status: AC

## 2017-12-14 NOTE — Plan of Care (Signed)

## 2017-12-14 NOTE — Plan of Care (Signed)
  Problem: Pain Managment: Goal: General experience of comfort will improve Outcome: Progressing   Problem: Safety: Goal: Ability to remain free from injury will improve Outcome: Progressing   

## 2017-12-14 NOTE — Discharge Instructions (Signed)
Complete 5 more days of doxycycline 100 mg 2 times daily.  We will work on getting you set up with the lymphedema clinic and with plastic surgery at follow-up.  Continue taking her Lasix 40 mg daily.  Notify the clinic if you show any signs of worsening swelling.

## 2017-12-14 NOTE — Discharge Summary (Signed)
Johnny [name] Discharge Summary  [name] Medical record number: [id number] Date of birth: 12/15/1960 Age: 57 y.o. Gender: male Date of Admission: 12/12/2017  Date of Discharge: 12/14/2017 Admitting Physician: Blane Ohara McDiarmid, MD  Primary Care Provider: [place] Bing, DO Consultants: none  Indication for Hospitalization: right thigh pannus edema and possible cellulitis  Discharge Diagnoses/Problem List:  Lymphedema of right thigh pannus with possible cellulitis Lymphedema with venous stasis Bilateral hydrocele  Left varicocele CKD stage IV Hypertension LVH OSA Morbid obesity  Disposition: home  Discharge Condition: stable, improved  Discharge Exam:  General: well nourished, well developed, NAD with non-toxic appearance HEENT: normocephalic, atraumatic, moist mucous membranes Cardiovascular: regular rate and rhythm without murmurs, rubs, or gallops Lungs: clear to auscultation bilaterally with normal work of breathing on room air Abdomen: morbidly obese, soft, non-tender, non-distended, normoactive bowel sounds Skin: warm, dry, no rashes or lesions, cap refill < 2 seconds Extremities: warm and well perfused, normal tone, bilateral 1+ pitting edema lower extremities with large right thigh pannus without erythema, no bruit or bowel sounds present on pannus  Brief [name] Course:  Johnny [name] a 56 y.o.malepresenting with leg swelling. PMH is significant forCKD stage IV,HTN, venous stasis, LVH.  Patient [facility] to [facility] ED on 10/21 for 1 week of increased swelling of his right thigh pannus with concerns for possible cellulitis.  She was started on broad-spectrum in the ED and transition to Ancef on the floor.  He did not show signs of infection based on exam and his vitals remained stable without fever throughout hospitalization.  There was low suspicion for infection, however decision was made to continue on doxycycline  outpatient.  Patient continued his home Lasix at 40 mg daily during hospitalization but did not show signs of fluid overload.  He was without signs of heart failure including shortness of breath or chest pain during hospitalization.  Swelling of right pannus thought to be related to uncontrolled lymphedema.  This was confirmed on CT and did not show signs of abscess formation.  There was some signs of testicular edema prompting ultrasound revealing bilateral hydroceles and a left varicocele.  Concerning the right pannus, we talked with general surgery considering removing the area however they suggested patient follow-up with plastics.  Issues for Follow Up:  1. Should complete 5 more days of doxycycline at discharge for 7 day treatment for presumed cellulitis of right pannus though this was unlikely the source of increased edea. 2. Consult plastics to remove right thigh pannus per general surgery. 3. Patient needs to re-establish with lymphedema clinic outpatient. Last seen over 1 year ago. 4. Did not change diuretic plan. Continued home Lasix 40 mg daily. 5. Patient received his flu vaccine during hoispitallization. 6. Patient not adherent to CPAP with known OSA.  Would likely improve with compliance.  Significant Procedures: none  Significant Labs and Imaging:  Recent Labs  Lab 12/12/17 1152  12/12/17 1354 12/13/17 0541 12/14/17 0154  WBC 7.5  --   --  6.9 7.2  HGB 11.6*   < > 11.6* 10.9* 10.1*  HCT 35.2*   < > 34.0* 32.3* 29.9*  PLT 338  --   --  328 323   < > = values in this interval not displayed.   Recent Labs  Lab 12/12/17 1152 12/12/17 1200 12/12/17 1354 12/13/17 0541 12/14/17 0154  NA 138 135  135 137 135 132*  K 4.8 6.9*  6.9* 5.1 5.0 4.9  CL 99 101  101 99 99 100  CO2 27  --   --  25 20*  GLUCOSE 109* 108*  108* 102* 120* 104*  BUN 55* 81*  81* 53* 51* 53*  CREATININE 4.06* 4.50*  4.50* 4.50* 3.75* 3.91*  CALCIUM 9.0  --   --  8.9 8.6*  ALKPHOS 47  --   --    --   --   AST 14*  --   --   --   --   ALT 12  --   --   --   --   ALBUMIN 2.7*  --   --   --   --    HIV antibody: Nonreactive  CT OF THE LOWER RIGHT EXTREMITY WITHOUT CONTRAST (12/12/2017) IMPRESSION: 1. Massive pedunculated fatty appearing enlargement of the inner upper thigh soft tissues. There is marked skin thickening and severe diffuse edema/infiltrated appearance of the fatty mass but without well-defined rim enhancing fluid collection to suggest focal abscess. 2. No acute osseous abnormality 3. Large scrotal fluid collection 4. Inguinal adenopathy.  ULTRASOUND OF SCROTUM (12/13/2017) IMPRESSION: 1. Large bilateral hydroceles. 2. Unremarkable appearance of the testicles. 3. Left varicocele.  Results/Tests Pending at Time of Discharge: none  Discharge Medications:  Allergies as of 12/14/2017      Reactions   Lisinopril Swelling   Angioedema      Medication List    STOP taking these medications   cholecalciferol 1000 units tablet Commonly known as:  VITAMIN D   levofloxacin 750 MG tablet Commonly known as:  LEVAQUIN   RAYALDEE 30 MCG Cpcr Generic drug:  Calcifediol ER   sodium polystyrene powder Commonly known as:  KAYEXALATE     TAKE these medications   acetaminophen 500 MG tablet Commonly known as:  TYLENOL Take 1,000 mg by mouth every 6 (six) hours as needed for mild pain.   amLODipine 5 MG tablet Commonly known as:  NORVASC Take 5 mg by mouth at bedtime.   aspirin 81 MG chewable tablet Chew 81 mg by mouth daily.   carvedilol 12.5 MG tablet Commonly known as:  COREG TAKE ONE TABLET BY MOUTH TWICE DAILY WITH MEALS   carvedilol 25 MG tablet Commonly known as:  COREG   doxycycline 100 MG tablet Commonly known as:  VIBRA-TABS Take 1 tablet (100 mg total) by mouth every 12 (twelve) hours for 5 days. What changed:  when to take this   furosemide 40 MG tablet Commonly known as:  LASIX Take 1 tablet (40 mg total) by mouth daily.    hydrochlorothiazide 25 MG tablet Commonly known as:  HYDRODIURIL TAKE ONE TABLET BY MOUTH  DAILY   spironolactone 100 MG tablet Commonly known as:  ALDACTONE Take 100 mg by mouth daily.       Discharge Instructions: Please refer to Patient Instructions section of EMR for full details.  Patient was counseled important signs and symptoms that should prompt return to medical care, changes in medications, dietary instructions, activity restrictions, and follow up appointments.   Follow-Up Appointments: Follow-up Information    [place]. Go on 12/28/2017.   Why:  Go to appointment at 4:10 PM. Please bring medications and arrive 15 minutes early. Contact information: [address]          Guadalupe Dawn, MD 12/14/2017, 11:24 AM PGY-3, [place]

## 2017-12-28 ENCOUNTER — Other Ambulatory Visit: Payer: Self-pay

## 2017-12-28 ENCOUNTER — Ambulatory Visit (INDEPENDENT_AMBULATORY_CARE_PROVIDER_SITE_OTHER): Payer: Medicaid Other | Admitting: Family Medicine

## 2017-12-28 ENCOUNTER — Encounter: Payer: Self-pay | Admitting: Family Medicine

## 2017-12-28 VITALS — BP 138/90 | HR 66 | Temp 97.6°F | Ht 69.0 in | Wt 319.0 lb

## 2017-12-28 DIAGNOSIS — L03115 Cellulitis of right lower limb: Secondary | ICD-10-CM

## 2017-12-28 DIAGNOSIS — G4730 Sleep apnea, unspecified: Secondary | ICD-10-CM

## 2017-12-28 DIAGNOSIS — E65 Localized adiposity: Secondary | ICD-10-CM

## 2017-12-28 NOTE — Patient Instructions (Signed)
I am pleased to hear that you made such an improvement with your weight.  We will work on getting you set up with plastic surgery to have your thigh looked at.  They should recheck you within the next month.  We will also have you undergo another sleep study to see if your sleep apnea has resolved.

## 2017-12-28 NOTE — Assessment & Plan Note (Signed)
Resolved.  Secondary to large right-sided pannus.  Would benefit from removal to prevent recurrence of cellulitis. - See plan for abdominal pannus - Reviewed return precautions

## 2017-12-28 NOTE — Progress Notes (Signed)
Subjective   Patient ID: Johnny [name]    DOB: 1960-12-29, 57 y.o. male   MRN: [id number]  CC: "Hospital follow-up"  HPI: Johnny [name] is a 57 y.o. male who presents to clinic today for the following:  Cellulitis of right pannus: Patient recently discharged from hospital after having increased erythema and warmth around his right thigh.  He was admitted and treated with IV antibiotics and transition to doxycycline and completed 5-day course for a 7-day total of antibiotics.  She states his symptoms resolved after completing the antibiotics and he feels back to his baseline.  He denies fevers or chills, nausea or vomiting, swelling.  He continues to take his Lasix 40 mg daily with good urine output he denies chest pain or shortness of breath.  Enlarged right pannus: Patient evaluated by general surgery during recent hospitalization with recommendations to see plastic surgery to remove excessive tissue on right pannus.  Sleep apnea: With known OSA not adherent to CPAP.  He does have a 95 pound weight loss since his last nocturnal polysomnogram back in 2015.  Patient feels that his sleep apnea has resolved any denies nocturnal awakenings or daytime somnolence.  ROS: see HPI for pertinent.  [place]: Severe obesity, HTN, CKD stage IV, LVH, OSA nonadherent to CPAP, h/o PE, acquired neural lymphedema, cystocele.  Surgical history unremarkable.  Family history cancer (father, colon), HTN.  Smoking status reviewed. Medications reviewed.  Objective   BP 138/90   Pulse 66   Temp 97.6 F (36.4 C) (Oral)   Ht 5\' 9"  (1.753 m)   Wt (!) 319 lb (144.7 kg)   SpO2 99%   BMI 47.11 kg/m  Vitals and nursing note reviewed.  General: well nourished, well developed, NAD with non-toxic appearance HEENT: normocephalic, atraumatic, moist mucous membranes Cardiovascular: regular rate and rhythm without murmurs, rubs, or gallops Lungs: clear to auscultation bilaterally with normal work of breathing Abdomen:  soft, non-tender, non-distended, normoactive bowel sounds, large right sided pannus without overlying erythema or increased calor Skin: warm, dry, no rashes or lesions, cap refill < 2 seconds Extremities: warm and well perfused, normal tone, no edema  Assessment & Plan   Cellulitis of right thigh Resolved.  Secondary to large right-sided pannus.  Would benefit from removal to prevent recurrence of cellulitis. - See plan for abdominal pannus - Reviewed return precautions  Pannus, abdominal Chronic.  Significant size right-sided pannus.  Likely secondary to acquired lymphedema exacerbated by excessive weight loss over the last several years due to diet and exercise.  Recent bout of cellulitis, now resolved.  Evaluated by general surgery recommending removal by plastics. - Ambulatory referral to plastics  Sleep apnea Chronic.  Not adherent to CPAP.  Does have a significant weight loss of 95 pounds. - Ambulatory referral for repeat nocturnal polysomnogram  Orders Placed This Encounter  Procedures  . Ambulatory referral to Plastic Surgery    Referral Priority:   Routine    Referral Type:   Surgical    Referral Reason:   Specialty Services Required    Requested Specialty:   Plastic Surgery    Number of Visits Requested:   1  . Nocturnal polysomnography (NPSG)    Standing Status:   Future    Standing Expiration Date:   12/29/2018    Order Specific Question:   Where should this test be performed:    Answer:   [facility]   No orders of the defined types were placed in this encounter.  Harriet Butte, [place], PGY-3 12/28/2017, 4:56 PM

## 2017-12-28 NOTE — Assessment & Plan Note (Addendum)
Chronic.  Not adherent to CPAP.  Does have a significant weight loss of 95 pounds. - Ambulatory referral for repeat nocturnal polysomnogram

## 2017-12-28 NOTE — Assessment & Plan Note (Signed)
Chronic.  Significant size right-sided pannus.  Likely secondary to acquired lymphedema exacerbated by excessive weight loss over the last several years due to diet and exercise.  Recent bout of cellulitis, now resolved.  Evaluated by general surgery recommending removal by plastics. - Ambulatory referral to plastics

## 2018-01-09 DIAGNOSIS — N184 Chronic kidney disease, stage 4 (severe): Secondary | ICD-10-CM | POA: Diagnosis not present

## 2018-01-16 DIAGNOSIS — D631 Anemia in chronic kidney disease: Secondary | ICD-10-CM | POA: Diagnosis not present

## 2018-01-16 DIAGNOSIS — I129 Hypertensive chronic kidney disease with stage 1 through stage 4 chronic kidney disease, or unspecified chronic kidney disease: Secondary | ICD-10-CM | POA: Diagnosis not present

## 2018-01-16 DIAGNOSIS — N184 Chronic kidney disease, stage 4 (severe): Secondary | ICD-10-CM | POA: Diagnosis not present

## 2018-01-16 DIAGNOSIS — N2581 Secondary hyperparathyroidism of renal origin: Secondary | ICD-10-CM | POA: Diagnosis not present

## 2018-01-24 ENCOUNTER — Ambulatory Visit: Payer: Medicaid Other | Admitting: Plastic Surgery

## 2018-01-24 ENCOUNTER — Encounter: Payer: Self-pay | Admitting: Plastic Surgery

## 2018-01-24 VITALS — BP 142/88 | HR 64 | Resp 20 | Ht 69.0 in | Wt 308.6 lb

## 2018-01-24 DIAGNOSIS — E881 Lipodystrophy, not elsewhere classified: Secondary | ICD-10-CM | POA: Diagnosis not present

## 2018-01-24 NOTE — Progress Notes (Addendum)
     Patient ID: Johnny [name], male    DOB: 1960-07-25, 57 y.o.   MRN: [id number]   Chief Complaint  Patient presents with  . Skin Problem    The patient is a 57 year old male here for evaluation of his medial legs and thighs.  He was 400 pounds 2 years ago.  He with diet and exercise he is now down to 308 pounds.  He is 5 feet 9 inches tall.  He denies diabetes.  He has been able to maintain this weight for over 6 months.  He finds that it is difficult to do any heavy exercising and walking because of the pendulous pannus on the medial aspect of both legs.  It gets worse as he loses weight.   Review of Systems  Constitutional: Negative.   HENT: Negative.   Eyes: Negative.   Respiratory: Negative.   Gastrointestinal: Negative.   Endocrine: Negative.   Genitourinary: Negative.   Musculoskeletal: Positive for gait problem.  Skin: Negative.   Psychiatric/Behavioral: Negative.     Past Medical History:  Diagnosis Date  . Acquired lymphedema 12/13/2017  . Diverticulosis of large intestine without hemorrhage 01/31/2012  . Essential HTN 04/21/2006  . Hypertension   . OSA 05/21/2013  . 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 COLONOSCOPY  01/31/2012   Procedure: COLONOSCOPY;  Surgeon: Inda Castle, MD;  Location: [facility];  Service: Endoscopy;  Laterality: N/A;  bmi is 37  . NO PAST SURGERIES        Current Outpatient Medications:  .  acetaminophen (TYLENOL) 500 MG tablet, Take 1,000 mg by mouth every 6 (six) hours as needed for mild pain., Disp: , Rfl:  .  amLODipine (NORVASC) 5 MG tablet, Take 5 mg by mouth at bedtime., Disp: , Rfl: 6 .  aspirin 81 MG chewable tablet, Chew 81 mg by mouth daily.  , Disp: , Rfl:  .  carvedilol (COREG) 25 MG tablet, , Disp: , Rfl: 12 .  carvedilol (COREG) 25 MG tablet, Take 1 tablet (25 mg total) by mouth 2 (two) times daily with a meal., Disp: 30 tablet, Rfl: 0 .  furosemide (LASIX) 40 MG tablet,  Take 1 tablet (40 mg total) by mouth daily., Disp: 10 tablet, Rfl: 0 .  hydrochlorothiazide (HYDRODIURIL) 25 MG tablet, TAKE ONE TABLET BY MOUTH  DAILY, Disp: 90 tablet, Rfl: 0 .  spironolactone (ALDACTONE) 100 MG tablet, Take 100 mg by mouth daily., Disp: , Rfl: 9   Objective:   Vitals:   01/24/18 1101  BP: (!) 142/88  Pulse: 64  Resp: 20  SpO2: 99%    Physical Exam  Constitutional: He appears well-developed and well-nourished.  HENT:  Head: Normocephalic and atraumatic.  Eyes: Pupils are equal, round, and reactive to light. EOM are normal.  Cardiovascular: Normal rate.  Pulmonary/Chest: Effort normal.  Abdominal: Soft.  Musculoskeletal: Normal range of motion. He exhibits deformity.  Skin: Skin is warm.  Psychiatric: He has a normal mood and affect. His behavior is normal. Judgment and thought content normal.    Assessment & Plan:  Lipodystrophy  The patient has done extremely well with his weight loss with diet and exercise.  Recommend reduction /excision of medial thighs.  His risks are high and this was expressed to him. He understands that he will likely have some wound problems but most likely we can get him through that to healing.  [place], DO

## 2018-02-23 ENCOUNTER — Ambulatory Visit (HOSPITAL_BASED_OUTPATIENT_CLINIC_OR_DEPARTMENT_OTHER): Payer: Medicaid Other | Attending: Family Medicine

## 2018-04-07 ENCOUNTER — Encounter: Payer: Self-pay | Admitting: Family Medicine

## 2018-04-10 DIAGNOSIS — N184 Chronic kidney disease, stage 4 (severe): Secondary | ICD-10-CM | POA: Diagnosis not present

## 2018-04-14 ENCOUNTER — Ambulatory Visit (INDEPENDENT_AMBULATORY_CARE_PROVIDER_SITE_OTHER): Payer: Self-pay | Admitting: Plastic Surgery

## 2018-04-14 ENCOUNTER — Encounter: Payer: Self-pay | Admitting: Plastic Surgery

## 2018-04-14 VITALS — BP 149/95 | HR 68 | Ht 69.0 in | Wt 315.0 lb

## 2018-04-14 DIAGNOSIS — Z01810 Encounter for preprocedural cardiovascular examination: Secondary | ICD-10-CM | POA: Diagnosis not present

## 2018-04-14 DIAGNOSIS — E881 Lipodystrophy, not elsewhere classified: Secondary | ICD-10-CM

## 2018-04-14 DIAGNOSIS — R9431 Abnormal electrocardiogram [ECG] [EKG]: Secondary | ICD-10-CM | POA: Diagnosis not present

## 2018-04-14 LAB — BASIC METABOLIC PANEL
Anion gap: 10 (ref 5–15)
BUN: 48 mg/dL — ABNORMAL HIGH (ref 6–20)
CHLORIDE: 103 mmol/L (ref 98–111)
CO2: 24 mmol/L (ref 22–32)
CREATININE: 4.24 mg/dL — AB (ref 0.61–1.24)
Calcium: 9 mg/dL (ref 8.9–10.3)
GFR calc non Af Amer: 15 mL/min — ABNORMAL LOW (ref >60)
GFR, EST AFRICAN AMERICAN: 17 mL/min — AB (ref >60)
Glucose, Bld: 103 mg/dL — ABNORMAL HIGH (ref 70–99)
POTASSIUM: 4.7 mmol/L (ref 3.5–5.1)
Sodium: 137 mmol/L (ref 135–145)

## 2018-04-14 LAB — CBC
HEMATOCRIT: 36.7 % — AB (ref 39.0–52.0)
HEMOGLOBIN: 12.3 g/dL — AB (ref 13.0–17.0)
MCH: 29.4 pg (ref 26.0–34.0)
MCHC: 33.5 g/dL (ref 30.0–36.0)
MCV: 87.8 fL (ref 80.0–100.0)
NRBC: 0 % (ref 0.0–0.2)
Platelets: 227 10*3/uL (ref 150–400)
RBC: 4.18 MIL/uL — AB (ref 4.22–5.81)
RDW: 13.6 % (ref 11.5–15.5)
WBC: 5 10*3/uL (ref 4.0–10.5)

## 2018-04-14 LAB — PREALBUMIN: Prealbumin: 27.2 mg/dL (ref 18–38)

## 2018-04-14 MED ORDER — HYDROCODONE-ACETAMINOPHEN 10-325 MG PO TABS: 1.0000 | ORAL_TABLET | Freq: Three times a day (TID) | ORAL | 0 refills | Status: DC | PRN

## 2018-04-14 MED ORDER — ONDANSETRON HCL 4 MG PO TABS: 4.0000 mg | ORAL_TABLET | Freq: Three times a day (TID) | ORAL | 0 refills | Status: AC | PRN

## 2018-04-14 MED ORDER — CEPHALEXIN 500 MG PO CAPS: 500.0000 mg | ORAL_CAPSULE | Freq: Four times a day (QID) | ORAL | 0 refills | Status: AC

## 2018-04-14 NOTE — H&P (View-Only) (Signed)
Patient ID: Johnny [name], male    DOB: 08-Dec-1960, 58 y.o.   MRN: [id number]   Chief Complaint  Patient presents with  . Skin Problem    The patient is a 58 year old male here for a history and physical for medial thigh resection.  He has lost 80 pounds in the past two years.  He was 400 pounds 2 years ago and is now 308 pounds.  He is 5 feet 9 inches tall he does not have diabetes and his weight has been maintained over the past 6 months.  The medial aspect of both legs has restricted his activity.    Review of Systems  Constitutional: Negative.   HENT: Negative.   Eyes: Negative.   Respiratory: Negative.   Cardiovascular: Negative.   Gastrointestinal: Negative.   Endocrine: Negative.   Genitourinary: Negative.   Musculoskeletal: Negative.   Skin: Negative.   Hematological: Negative.     Past Medical History:  Diagnosis Date  . Acquired lymphedema 12/13/2017  . Diverticulosis of large intestine without hemorrhage 01/31/2012  . Essential HTN 04/21/2006  . Hypertension   . OSA 05/21/2013  . 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 COLONOSCOPY  01/31/2012   Procedure: COLONOSCOPY;  Surgeon: Inda Castle, MD;  Location: [facility];  Service: Endoscopy;  Laterality: N/A;  bmi is 65  . NO PAST SURGERIES        Current Outpatient Medications:  .  aspirin 81 MG chewable tablet, Chew 81 mg by mouth daily.  , Disp: , Rfl:  .  carvedilol (COREG) 25 MG tablet, Take 1 tablet (25 mg total) by mouth 2 (two) times daily with a meal., Disp: 30 tablet, Rfl: 0 .  cephALEXin (KEFLEX) 500 MG capsule, Take 1 capsule (500 mg total) by mouth 4 (four) times daily for 5 days., Disp: 20 capsule, Rfl: 0 .  furosemide (LASIX) 40 MG tablet, Take 1 tablet (40 mg total) by mouth daily., Disp: 10 tablet, Rfl: 0 .  hydrochlorothiazide (HYDRODIURIL) 25 MG tablet, TAKE ONE TABLET BY MOUTH  DAILY (Patient not taking: Reported on 04/13/2018), Disp: 90  tablet, Rfl: 0 .  HYDROcodone-acetaminophen (NORCO) 10-325 MG tablet, Take 1 tablet by mouth every 8 (eight) hours as needed., Disp: 30 tablet, Rfl: 0 .  hydroxypropyl methylcellulose / hypromellose (ISOPTO TEARS / GONIOVISC) 2.5 % ophthalmic solution, Place 1 drop into both eyes 2 (two) times daily as needed for dry eyes., Disp: , Rfl:  .  ondansetron (ZOFRAN) 4 MG tablet, Take 1 tablet (4 mg total) by mouth every 8 (eight) hours as needed for up to 5 days for nausea or vomiting., Disp: 15 tablet, Rfl: 0 .  RAYALDEE 30 MCG CPCR, Take 30 mcg by mouth daily., Disp: , Rfl: 1 .  spironolactone (ALDACTONE) 100 MG tablet, Take 100 mg by mouth daily., Disp: , Rfl: 9   Objective:   Vitals:   04/14/18 1456  BP: (!) 149/95  Pulse: 68  SpO2: 100%    Physical Exam Vitals signs and nursing note reviewed.  Constitutional:      Appearance: Normal appearance.  HENT:     Head: Normocephalic and atraumatic.  Cardiovascular:     Rate and Rhythm: Normal rate.     Pulses: Normal pulses.  Pulmonary:     Effort: Pulmonary effort is normal.  Skin:    General: Skin is warm.  Neurological:     General: No focal deficit present.  Mental Status: He is alert.  Psychiatric:        Mood and Affect: Mood normal.        Judgment: Judgment normal.     Assessment & Plan:  Lipodystrophy Plan for medial thigh excision.  Prescriptions sent in to pharmacy. We discussed that he has a high chance of skin breakdown after the surgery.  The risks that can be encountered with and after excision of a skin lesion were discussed and include the following but not limited to these: bleeding, infection, delayed healing, anesthesia risks, skin sensation changes, injury to structures including nerves, blood vessels, and muscles which may be temporary or permanent, allergies to tape, suture materials and glues, blood products, topical preparations or injected agents, skin contour irregularities, skin discoloration and  swelling, deep vein thrombosis, cardiac and pulmonary complications, pain, which may persist, persistent pain, recurrence of the lesion, poor healing of the incision, possible need for revisional surgery or staged procedures.    [place], DO

## 2018-04-14 NOTE — Progress Notes (Signed)
Patient ID: Johnny [name], male    DOB: 03-Feb-1961, 58 y.o.   MRN: [id number]   Chief Complaint  Patient presents with  . Skin Problem    The patient is a 58 year old male here for a history and physical for medial thigh resection.  He has lost 80 pounds in the past two years.  He was 400 pounds 2 years ago and is now 308 pounds.  He is 5 feet 9 inches tall he does not have diabetes and his weight has been maintained over the past 6 months.  The medial aspect of both legs has restricted his activity.    Review of Systems  Constitutional: Negative.   HENT: Negative.   Eyes: Negative.   Respiratory: Negative.   Cardiovascular: Negative.   Gastrointestinal: Negative.   Endocrine: Negative.   Genitourinary: Negative.   Musculoskeletal: Negative.   Skin: Negative.   Hematological: Negative.     Past Medical History:  Diagnosis Date  . Acquired lymphedema 12/13/2017  . Diverticulosis of large intestine without hemorrhage 01/31/2012  . Essential HTN 04/21/2006  . Hypertension   . OSA 05/21/2013  . 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 COLONOSCOPY  01/31/2012   Procedure: COLONOSCOPY;  Surgeon: Inda Castle, MD;  Location: [facility];  Service: Endoscopy;  Laterality: N/A;  bmi is 83  . NO PAST SURGERIES        Current Outpatient Medications:  .  aspirin 81 MG chewable tablet, Chew 81 mg by mouth daily.  , Disp: , Rfl:  .  carvedilol (COREG) 25 MG tablet, Take 1 tablet (25 mg total) by mouth 2 (two) times daily with a meal., Disp: 30 tablet, Rfl: 0 .  cephALEXin (KEFLEX) 500 MG capsule, Take 1 capsule (500 mg total) by mouth 4 (four) times daily for 5 days., Disp: 20 capsule, Rfl: 0 .  furosemide (LASIX) 40 MG tablet, Take 1 tablet (40 mg total) by mouth daily., Disp: 10 tablet, Rfl: 0 .  hydrochlorothiazide (HYDRODIURIL) 25 MG tablet, TAKE ONE TABLET BY MOUTH  DAILY (Patient not taking: Reported on 04/13/2018), Disp: 90  tablet, Rfl: 0 .  HYDROcodone-acetaminophen (NORCO) 10-325 MG tablet, Take 1 tablet by mouth every 8 (eight) hours as needed., Disp: 30 tablet, Rfl: 0 .  hydroxypropyl methylcellulose / hypromellose (ISOPTO TEARS / GONIOVISC) 2.5 % ophthalmic solution, Place 1 drop into both eyes 2 (two) times daily as needed for dry eyes., Disp: , Rfl:  .  ondansetron (ZOFRAN) 4 MG tablet, Take 1 tablet (4 mg total) by mouth every 8 (eight) hours as needed for up to 5 days for nausea or vomiting., Disp: 15 tablet, Rfl: 0 .  RAYALDEE 30 MCG CPCR, Take 30 mcg by mouth daily., Disp: , Rfl: 1 .  spironolactone (ALDACTONE) 100 MG tablet, Take 100 mg by mouth daily., Disp: , Rfl: 9   Objective:   Vitals:   04/14/18 1456  BP: (!) 149/95  Pulse: 68  SpO2: 100%    Physical Exam Vitals signs and nursing note reviewed.  Constitutional:      Appearance: Normal appearance.  HENT:     Head: Normocephalic and atraumatic.  Cardiovascular:     Rate and Rhythm: Normal rate.     Pulses: Normal pulses.  Pulmonary:     Effort: Pulmonary effort is normal.  Skin:    General: Skin is warm.  Neurological:     General: No focal deficit present.  Mental Status: He is alert.  Psychiatric:        Mood and Affect: Mood normal.        Judgment: Judgment normal.     Assessment & Plan:  Lipodystrophy Plan for medial thigh excision.  Prescriptions sent in to pharmacy. We discussed that he has a high chance of skin breakdown after the surgery.  The risks that can be encountered with and after excision of a skin lesion were discussed and include the following but not limited to these: bleeding, infection, delayed healing, anesthesia risks, skin sensation changes, injury to structures including nerves, blood vessels, and muscles which may be temporary or permanent, allergies to tape, suture materials and glues, blood products, topical preparations or injected agents, skin contour irregularities, skin discoloration and  swelling, deep vein thrombosis, cardiac and pulmonary complications, pain, which may persist, persistent pain, recurrence of the lesion, poor healing of the incision, possible need for revisional surgery or staged procedures.    [place], DO

## 2018-04-17 NOTE — Pre-Procedure Instructions (Signed)
Johnny [name]  04/17/2018      [place], [place] - [address] [place] [address] Phone: [phone] Fax: [phone]    Your procedure is scheduled on Thurs.m Feb. 27, 2020 from 1:15PM-4:45PM  Report to [facility] Entrance "A" at 11:15AM  Call this number if you have problems the morning of surgery:  [phone]   Remember:  Do not eat or drink except medicines after midnight on Feb. 26th    Take these medicines the morning of surgery with A SIP OF WATER: Carvedilol (COREG)  If needed: HYDROcodone-acetaminophen ([place]), Ondansetron (ZOFRAN), and Hydroxypropyl methylcellulose / hypromellose (ISOPTO TEARS / GONIOVISC)    Follow your surgeon's instructions on when to stop Aspirin.  If no instructions were given by your surgeon then you will need to call the office to get those instructions.    As of today, stop taking all Other Aspirin Products, Vitamins, Fish oils, and Herbal medications. Also stop all NSAIDS i.e. Advil, Ibuprofen, Motrin, Aleve, Anaprox, Naproxen, BC, Goody Powders, and all Supplements.    Do not wear jewelry.  Do not wear lotions, powders, colognes, or deodorant.  Do not shave 48 hours prior to surgery.  Men may shave face.  Do not bring valuables to the hospital.  [facility] is not responsible for any belongings or valuables.  Contacts, dentures or bridgework may not be worn into surgery.  Leave your suitcase in the car.  After surgery it may be brought to your room.  For patients admitted to the hospital, discharge time will be determined by your treatment team.  Patients discharged the day of surgery will not be allowed to drive home.   Special instructions:   - Preparing For Surgery  Before surgery, you can play an important role. Because skin is not sterile, your skin needs to be as free of germs as possible. You can reduce the number of germs on your skin by  washing with CHG (chlorahexidine gluconate) Soap before surgery.  CHG is an antiseptic cleaner which kills germs and bonds with the skin to continue killing germs even after washing.    Oral Hygiene is also important to reduce your risk of infection.  Remember - BRUSH YOUR TEETH THE MORNING OF SURGERY WITH YOUR REGULAR TOOTHPASTE  Please do not use if you have an allergy to CHG or antibacterial soaps. If your skin becomes reddened/irritated stop using the CHG.  Do not shave (including legs and underarms) for at least 48 hours prior to first CHG shower. It is OK to shave your face.  Please follow these instructions carefully.   1. Shower the NIGHT BEFORE SURGERY and the MORNING OF SURGERY with CHG.   2. If you chose to wash your hair, wash your hair first as usual with your normal shampoo.  3. After you shampoo, rinse your hair and body thoroughly to remove the shampoo.  4. Use CHG as you would any other liquid soap. You can apply CHG directly to the skin and wash gently with a scrungie or a clean washcloth.   5. Apply the CHG Soap to your body ONLY FROM THE NECK DOWN.  Do not use on open wounds or open sores. Avoid contact with your eyes, ears, mouth and genitals (private parts). Wash Face and genitals (private parts)  with your normal soap.  6. Wash thoroughly, paying special attention to the area where your surgery will be performed.  7. Thoroughly  rinse your body with warm water from the neck down.  8. DO NOT shower/wash with your normal soap after using and rinsing off the CHG Soap.  9. Pat yourself dry with a CLEAN TOWEL.  10. Wear CLEAN PAJAMAS to bed the night before surgery, wear comfortable clothes the morning of surgery  11. Place CLEAN SHEETS on your bed the night of your first shower and DO NOT SLEEP WITH PETS.  Day of Surgery:  Do not apply any deodorants/lotions.  Please wear clean clothes to the hospital/surgery center.   Remember to brush your teeth WITH YOUR  REGULAR TOOTHPASTE.  Please read over the following fact sheets that you were given. Pain Booklet, Coughing and Deep Breathing and Surgical Site Infection Prevention

## 2018-04-18 ENCOUNTER — Encounter (HOSPITAL_COMMUNITY): Payer: Self-pay

## 2018-04-18 ENCOUNTER — Other Ambulatory Visit: Payer: Self-pay

## 2018-04-18 ENCOUNTER — Encounter (HOSPITAL_COMMUNITY)
Admission: RE | Admit: 2018-04-18 | Discharge: 2018-04-18 | Disposition: A | Discharge status: Home / Self Care | Payer: Medicaid Other | Source: Ambulatory Visit | Attending: Plastic Surgery | Admitting: Plastic Surgery

## 2018-04-18 DIAGNOSIS — R9431 Abnormal electrocardiogram [ECG] [EKG]: Secondary | ICD-10-CM | POA: Insufficient documentation

## 2018-04-18 DIAGNOSIS — Z01810 Encounter for preprocedural cardiovascular examination: Secondary | ICD-10-CM | POA: Insufficient documentation

## 2018-04-18 HISTORY — DX: Chronic kidney disease, unspecified: N18.9

## 2018-04-18 NOTE — Progress Notes (Addendum)
PCP - Harriet Butte, DO Cardiologist - pt denies  Chest x-ray - n/a EKG - 04/18/2018  Stress Test -  Pt denies ECHO - 2009 in EPIC  Cardiac Cath - pt denies  Sleep Study - Yes-2015 in EPIC CPAP - no-he lost weight and no longer uses  Fasting Blood Sugar - n/a Checks Blood Sugar _____ times a day-n/a  Blood Thinner Instructions: n/a Aspirin Instructions: Aspirin 81 mg-  No instructions, patient calling office today , he will not take Aspirin tomorrow or day of surgery  Anesthesia review: Yes-abnormal labs 04/14/2018-in Epic, EKG  Patient denies shortness of breath, fever, cough and chest pain at PAT appointment  Patient verbalized understanding of instructions that were given to them at the PAT appointment. Patient was also instructed that they will need to review over the PAT instructions again at home before surgery.

## 2018-04-19 DIAGNOSIS — I129 Hypertensive chronic kidney disease with stage 1 through stage 4 chronic kidney disease, or unspecified chronic kidney disease: Secondary | ICD-10-CM | POA: Diagnosis not present

## 2018-04-19 DIAGNOSIS — D631 Anemia in chronic kidney disease: Secondary | ICD-10-CM | POA: Diagnosis not present

## 2018-04-19 DIAGNOSIS — N184 Chronic kidney disease, stage 4 (severe): Secondary | ICD-10-CM | POA: Diagnosis not present

## 2018-04-19 DIAGNOSIS — N2581 Secondary hyperparathyroidism of renal origin: Secondary | ICD-10-CM | POA: Diagnosis not present

## 2018-04-19 MED ORDER — DEXTROSE 5 % IV SOLN
3.0000 g | INTRAVENOUS | Status: AC
  Administered 2018-04-20: 3 g via INTRAVENOUS
  Filled 2018-04-19: qty 3

## 2018-04-19 NOTE — Anesthesia Preprocedure Evaluation (Addendum)
Anesthesia Evaluation  Patient identified by MRN, date of birth, ID band Patient awake    Reviewed: Allergy & Precautions, NPO status , Patient's Chart, lab work & pertinent test results, reviewed documented beta blocker date and time   Airway Mallampati: I  TM Distance: >3 FB Neck ROM: Full    Dental  (+) Upper Dentures, Lower Dentures   Pulmonary sleep apnea ,    breath sounds clear to auscultation       Cardiovascular hypertension, Pt. on medications and Pt. on home beta blockers  Rhythm:Regular Rate:Normal     Neuro/Psych negative neurological ROS  negative psych ROS   GI/Hepatic negative GI ROS, Neg liver ROS,   Endo/Other  negative endocrine ROS  Renal/GU CRFRenal disease     Musculoskeletal negative musculoskeletal ROS (+)   Abdominal (+) + obese,   Peds  Hematology negative hematology ROS (+)   Anesthesia Other Findings   Reproductive/Obstetrics                           Lab Results  Component Value Date   WBC 5.0 04/14/2018   HGB 12.3 (L) 04/14/2018   HCT 36.7 (L) 04/14/2018   MCV 87.8 04/14/2018   PLT 227 04/14/2018   Lab Results  Component Value Date   CREATININE 4.24 (H) 04/14/2018   BUN 48 (H) 04/14/2018   NA 137 04/14/2018   K 4.7 04/14/2018   CL 103 04/14/2018   CO2 24 04/14/2018   Lab Results  Component Value Date   INR 2.2 12/28/2010   INR 2.2 11/30/2010   INR 2.6 11/02/2010   EKG: normal sinus rhythm. Anesthesia Physical Anesthesia Plan  ASA: III  Anesthesia Plan: General   Post-op Pain Management:    Induction: Intravenous  PONV Risk Score and Plan: 3 and Ondansetron, Dexamethasone and Midazolam  Airway Management Planned: Oral ETT  Additional Equipment: None  Intra-op Plan:   Post-operative Plan: Extubation in OR  Informed Consent: I have reviewed the patients History and Physical, chart, labs and discussed the procedure including the  risks, benefits and alternatives for the proposed anesthesia with the patient or authorized representative who has indicated his/her understanding and acceptance.     Dental advisory given  Plan Discussed with: CRNA  Anesthesia Plan Comments: (Pt follows with Dr. Elmarie Shiley for CKD IV. I called [facility] and Dr. Posey Pronto confirmed he was aware of creatinine 4.24on 2/21  and that the pt is cleared for surgery from nephrology standpoint. )      Anesthesia Quick Evaluation

## 2018-04-20 ENCOUNTER — Ambulatory Visit (HOSPITAL_COMMUNITY)
Admission: RE | Admit: 2018-04-20 | Discharge: 2018-04-20 | Disposition: A | Discharge status: Home / Self Care | Payer: Medicaid Other | Attending: Plastic Surgery | Admitting: Plastic Surgery

## 2018-04-20 ENCOUNTER — Ambulatory Visit (HOSPITAL_COMMUNITY): Payer: Medicaid Other | Admitting: Anesthesiology

## 2018-04-20 ENCOUNTER — Other Ambulatory Visit: Payer: Self-pay

## 2018-04-20 ENCOUNTER — Encounter (HOSPITAL_COMMUNITY): Payer: Self-pay | Admitting: General Practice

## 2018-04-20 ENCOUNTER — Ambulatory Visit (HOSPITAL_COMMUNITY): Payer: Medicaid Other | Admitting: Physician Assistant

## 2018-04-20 ENCOUNTER — Encounter (HOSPITAL_COMMUNITY)
Admission: RE | Disposition: A | Discharge status: Home / Self Care | Payer: Self-pay | Source: Home / Self Care | Attending: Plastic Surgery

## 2018-04-20 DIAGNOSIS — G4733 Obstructive sleep apnea (adult) (pediatric): Secondary | ICD-10-CM | POA: Diagnosis not present

## 2018-04-20 DIAGNOSIS — I129 Hypertensive chronic kidney disease with stage 1 through stage 4 chronic kidney disease, or unspecified chronic kidney disease: Secondary | ICD-10-CM | POA: Diagnosis not present

## 2018-04-20 DIAGNOSIS — Z6841 Body Mass Index (BMI) 40.0 and over, adult: Secondary | ICD-10-CM | POA: Diagnosis not present

## 2018-04-20 DIAGNOSIS — I10 Essential (primary) hypertension: Secondary | ICD-10-CM | POA: Diagnosis not present

## 2018-04-20 DIAGNOSIS — Z86711 Personal history of pulmonary embolism: Secondary | ICD-10-CM | POA: Diagnosis not present

## 2018-04-20 DIAGNOSIS — Z79899 Other long term (current) drug therapy: Secondary | ICD-10-CM | POA: Diagnosis not present

## 2018-04-20 DIAGNOSIS — Z7982 Long term (current) use of aspirin: Secondary | ICD-10-CM | POA: Diagnosis not present

## 2018-04-20 DIAGNOSIS — E881 Lipodystrophy, not elsewhere classified: Secondary | ICD-10-CM | POA: Insufficient documentation

## 2018-04-20 DIAGNOSIS — N184 Chronic kidney disease, stage 4 (severe): Secondary | ICD-10-CM | POA: Diagnosis not present

## 2018-04-20 HISTORY — PX: PANNICULECTOMY: SHX5360

## 2018-04-20 LAB — TYPE AND SCREEN
ABO/RH(D): A POS
Antibody Screen: NEGATIVE

## 2018-04-20 SURGERY — PANNICULECTOMY
Anesthesia: General | Site: Thigh | Laterality: Bilateral

## 2018-04-20 MED ORDER — SODIUM CHLORIDE (PF) 0.9 % IJ SOLN
INTRAMUSCULAR | Status: DC | PRN
  Administered 2018-04-20: 50 mL

## 2018-04-20 MED ORDER — SODIUM CHLORIDE 0.9 % IV SOLN: 250.0000 mL | INTRAVENOUS | Status: DC | PRN

## 2018-04-20 MED ORDER — LIDOCAINE-EPINEPHRINE 1 %-1:100000 IJ SOLN
INTRAMUSCULAR | Status: DC | PRN
  Administered 2018-04-20: 10 mL

## 2018-04-20 MED ORDER — SODIUM CHLORIDE 0.9 % IV SOLN
INTRAVENOUS | Status: DC | PRN
  Administered 2018-04-20: 50 ug/min via INTRAVENOUS

## 2018-04-20 MED ORDER — EPHEDRINE SULFATE-NACL 50-0.9 MG/10ML-% IV SOSY
PREFILLED_SYRINGE | INTRAVENOUS | Status: DC | PRN
  Administered 2018-04-20 (×3): 10 mg via INTRAVENOUS

## 2018-04-20 MED ORDER — ACETAMINOPHEN 325 MG PO TABS: 325.0000 mg | ORAL_TABLET | Freq: Once | ORAL | Status: DC

## 2018-04-20 MED ORDER — ACETAMINOPHEN 10 MG/ML IV SOLN: 1000.0000 mg | Freq: Once | INTRAVENOUS | Status: DC | PRN

## 2018-04-20 MED ORDER — LACTATED RINGERS IV SOLN: INTRAVENOUS | Status: DC

## 2018-04-20 MED ORDER — VECURONIUM BROMIDE 10 MG IV SOLR
INTRAVENOUS | Status: DC | PRN
  Administered 2018-04-20 (×2): 5 mg via INTRAVENOUS

## 2018-04-20 MED ORDER — CHLORHEXIDINE GLUCONATE CLOTH 2 % EX PADS: 6.0000 | MEDICATED_PAD | Freq: Once | CUTANEOUS | Status: DC

## 2018-04-20 MED ORDER — VECURONIUM BROMIDE 10 MG IV SOLR
INTRAVENOUS | Status: AC
  Filled 2018-04-20: qty 10

## 2018-04-20 MED ORDER — SUCCINYLCHOLINE CHLORIDE 200 MG/10ML IV SOSY
PREFILLED_SYRINGE | INTRAVENOUS | Status: AC
  Filled 2018-04-20: qty 10

## 2018-04-20 MED ORDER — OXYCODONE HCL 5 MG PO TABS
ORAL_TABLET | ORAL | Status: AC
  Filled 2018-04-20: qty 2

## 2018-04-20 MED ORDER — MIDAZOLAM HCL 2 MG/2ML IJ SOLN
INTRAMUSCULAR | Status: AC
  Filled 2018-04-20: qty 2

## 2018-04-20 MED ORDER — SODIUM CHLORIDE 0.9 % IV SOLN
INTRAVENOUS | Status: DC
  Administered 2018-04-20 (×2): via INTRAVENOUS

## 2018-04-20 MED ORDER — ACETAMINOPHEN 160 MG/5ML PO SOLN: 325.0000 mg | Freq: Once | ORAL | Status: DC

## 2018-04-20 MED ORDER — FENTANYL CITRATE (PF) 250 MCG/5ML IJ SOLN
INTRAMUSCULAR | Status: DC | PRN
  Administered 2018-04-20: 150 ug via INTRAVENOUS
  Administered 2018-04-20: 50 ug via INTRAVENOUS

## 2018-04-20 MED ORDER — SODIUM CHLORIDE 0.9% FLUSH: 3.0000 mL | Freq: Two times a day (BID) | INTRAVENOUS | Status: DC

## 2018-04-20 MED ORDER — HYDROMORPHONE HCL 1 MG/ML IJ SOLN: 0.2500 mg | INTRAMUSCULAR | Status: DC | PRN

## 2018-04-20 MED ORDER — CEFAZOLIN SODIUM 1 G IJ SOLR
INTRAMUSCULAR | Status: AC
  Filled 2018-04-20: qty 30

## 2018-04-20 MED ORDER — LIDOCAINE-EPINEPHRINE 1 %-1:100000 IJ SOLN
INTRAMUSCULAR | Status: AC
  Filled 2018-04-20: qty 1

## 2018-04-20 MED ORDER — PHENYLEPHRINE HCL 10 MG/ML IJ SOLN
INTRAMUSCULAR | Status: AC
  Filled 2018-04-20: qty 1

## 2018-04-20 MED ORDER — PROPOFOL 10 MG/ML IV BOLUS
INTRAVENOUS | Status: AC
  Filled 2018-04-20: qty 20

## 2018-04-20 MED ORDER — ACETAMINOPHEN 650 MG RE SUPP: 650.0000 mg | RECTAL | Status: DC | PRN

## 2018-04-20 MED ORDER — BUPIVACAINE-EPINEPHRINE (PF) 0.5% -1:200000 IJ SOLN
INTRAMUSCULAR | Status: AC
  Filled 2018-04-20: qty 30

## 2018-04-20 MED ORDER — LIDOCAINE 2% (20 MG/ML) 5 ML SYRINGE
INTRAMUSCULAR | Status: DC | PRN
  Administered 2018-04-20: 60 mg via INTRAVENOUS

## 2018-04-20 MED ORDER — PHENYLEPHRINE 40 MCG/ML (10ML) SYRINGE FOR IV PUSH (FOR BLOOD PRESSURE SUPPORT)
PREFILLED_SYRINGE | INTRAVENOUS | Status: DC | PRN
  Administered 2018-04-20 (×2): 80 ug via INTRAVENOUS

## 2018-04-20 MED ORDER — DEXAMETHASONE SODIUM PHOSPHATE 10 MG/ML IJ SOLN
INTRAMUSCULAR | Status: AC
  Filled 2018-04-20: qty 1

## 2018-04-20 MED ORDER — ALBUMIN HUMAN 5 % IV SOLN
INTRAVENOUS | Status: DC | PRN
  Administered 2018-04-20: 16:00:00 via INTRAVENOUS

## 2018-04-20 MED ORDER — PROPOFOL 10 MG/ML IV BOLUS
INTRAVENOUS | Status: DC | PRN
  Administered 2018-04-20: 160 mg via INTRAVENOUS

## 2018-04-20 MED ORDER — MIDAZOLAM HCL 2 MG/2ML IJ SOLN
INTRAMUSCULAR | Status: DC | PRN
  Administered 2018-04-20: 2 mg via INTRAVENOUS

## 2018-04-20 MED ORDER — SUGAMMADEX SODIUM 500 MG/5ML IV SOLN
INTRAVENOUS | Status: AC
  Filled 2018-04-20: qty 5

## 2018-04-20 MED ORDER — DEXAMETHASONE SODIUM PHOSPHATE 10 MG/ML IJ SOLN
INTRAMUSCULAR | Status: DC | PRN
  Administered 2018-04-20: 10 mg via INTRAVENOUS

## 2018-04-20 MED ORDER — LIDOCAINE 2% (20 MG/ML) 5 ML SYRINGE
INTRAMUSCULAR | Status: AC
  Filled 2018-04-20: qty 5

## 2018-04-20 MED ORDER — ONDANSETRON HCL 4 MG/2ML IJ SOLN
INTRAMUSCULAR | Status: AC
  Filled 2018-04-20: qty 2

## 2018-04-20 MED ORDER — SODIUM CHLORIDE (PF) 0.9 % IJ SOLN
INTRAMUSCULAR | Status: AC
  Filled 2018-04-20: qty 10

## 2018-04-20 MED ORDER — BUPIVACAINE-EPINEPHRINE (PF) 0.5% -1:200000 IJ SOLN
INTRAMUSCULAR | Status: DC | PRN
  Administered 2018-04-20: 15 mL

## 2018-04-20 MED ORDER — MEPERIDINE HCL 50 MG/ML IJ SOLN: 6.2500 mg | INTRAMUSCULAR | Status: DC | PRN

## 2018-04-20 MED ORDER — SUGAMMADEX SODIUM 200 MG/2ML IV SOLN
INTRAVENOUS | Status: DC | PRN
  Administered 2018-04-20: 500 mg via INTRAVENOUS

## 2018-04-20 MED ORDER — ROCURONIUM BROMIDE 50 MG/5ML IV SOSY
PREFILLED_SYRINGE | INTRAVENOUS | Status: AC
  Filled 2018-04-20: qty 20

## 2018-04-20 MED ORDER — OXYCODONE HCL 5 MG PO TABS
5.0000 mg | ORAL_TABLET | ORAL | Status: DC | PRN
  Administered 2018-04-20: 10 mg via ORAL

## 2018-04-20 MED ORDER — ROCURONIUM BROMIDE 10 MG/ML (PF) SYRINGE
PREFILLED_SYRINGE | INTRAVENOUS | Status: DC | PRN
  Administered 2018-04-20 (×3): 50 mg via INTRAVENOUS

## 2018-04-20 MED ORDER — ONDANSETRON HCL 4 MG/2ML IJ SOLN
INTRAMUSCULAR | Status: DC | PRN
  Administered 2018-04-20: 4 mg via INTRAVENOUS

## 2018-04-20 MED ORDER — PROMETHAZINE HCL 25 MG/ML IJ SOLN: 6.2500 mg | INTRAMUSCULAR | Status: DC | PRN

## 2018-04-20 MED ORDER — SODIUM CHLORIDE 0.9% FLUSH: 3.0000 mL | INTRAVENOUS | Status: DC | PRN

## 2018-04-20 MED ORDER — ACETAMINOPHEN 325 MG PO TABS: 650.0000 mg | ORAL_TABLET | ORAL | Status: DC | PRN

## 2018-04-20 MED ORDER — 0.9 % SODIUM CHLORIDE (POUR BTL) OPTIME
TOPICAL | Status: DC | PRN
  Administered 2018-04-20 (×2): 1000 mL

## 2018-04-20 MED ORDER — FENTANYL CITRATE (PF) 250 MCG/5ML IJ SOLN
INTRAMUSCULAR | Status: AC
  Filled 2018-04-20: qty 5

## 2018-04-20 SURGICAL SUPPLY — 65 items
APPLIER CLIP 13 LRG OPEN (CLIP) ×6
APPLIER CLIP 9.375 MED OPEN (MISCELLANEOUS) ×9
APR CLP LRG 13 20 CLIP (CLIP) ×2
APR CLP MED 9.3 20 MLT OPN (MISCELLANEOUS) ×3
BAG DECANTER FOR FLEXI CONT (MISCELLANEOUS) ×1 IMPLANT
BANDAGE ACE 4X5 VEL STRL LF (GAUZE/BANDAGES/DRESSINGS) ×4 IMPLANT
BLADE 10 SAFETY STRL DISP (BLADE) ×3 IMPLANT
BNDG GAUZE ELAST 4 BULKY (GAUZE/BANDAGES/DRESSINGS) ×2 IMPLANT
CANISTER SUCT 3000ML PPV (MISCELLANEOUS) ×3 IMPLANT
CHLORAPREP W/TINT 26ML (MISCELLANEOUS) ×13 IMPLANT
CLIP APPLIE 13 LRG OPEN (CLIP) IMPLANT
CLIP APPLIE 9.375 MED OPEN (MISCELLANEOUS) ×1 IMPLANT
CLOSURE WOUND 1/2 X4 (GAUZE/BANDAGES/DRESSINGS)
CORDS BIPOLAR (ELECTRODE) ×1 IMPLANT
COVER SURGICAL LIGHT HANDLE (MISCELLANEOUS) ×1 IMPLANT
COVER WAND RF STERILE (DRAPES) ×1 IMPLANT
DRAIN CHANNEL 19F RND (DRAIN) ×2 IMPLANT
DRAPE HALF SHEET 40X57 (DRAPES) ×6 IMPLANT
DRAPE ORTHO SPLIT 77X108 STRL (DRAPES) ×6
DRAPE SURG 17X11 SM STRL (DRAPES) ×6 IMPLANT
DRAPE SURG ORHT 6 SPLT 77X108 (DRAPES) ×2 IMPLANT
DRAPE WARM FLUID 44X44 (DRAPE) ×3 IMPLANT
DRESSING PREVENA PLUS CUSTOM (GAUZE/BANDAGES/DRESSINGS) IMPLANT
DRSG PAD ABDOMINAL 8X10 ST (GAUZE/BANDAGES/DRESSINGS) ×4 IMPLANT
DRSG PREVENA PLUS CUSTOM (GAUZE/BANDAGES/DRESSINGS) ×3
ELECT BLADE 6.5 EXT (BLADE) IMPLANT
ELECT CAUTERY BLADE 6.4 (BLADE) ×3 IMPLANT
ELECT REM PT RETURN 9FT ADLT (ELECTROSURGICAL) ×3
ELECTRODE REM PT RTRN 9FT ADLT (ELECTROSURGICAL) ×1 IMPLANT
EVACUATOR SILICONE 100CC (DRAIN) ×2 IMPLANT
GAUZE SPONGE 4X4 12PLY STRL (GAUZE/BANDAGES/DRESSINGS) ×6 IMPLANT
GLOVE BIO SURGEON STRL SZ 6.5 (GLOVE) ×10 IMPLANT
GLOVE BIO SURGEONS STRL SZ 6.5 (GLOVE) ×9
GOWN STRL REUS W/ TWL LRG LVL3 (GOWN DISPOSABLE) ×2 IMPLANT
GOWN STRL REUS W/TWL LRG LVL3 (GOWN DISPOSABLE) ×6
KIT BASIN OR (CUSTOM PROCEDURE TRAY) ×7 IMPLANT
KIT DRSG PREVENA PLUS 7DAY 125 (MISCELLANEOUS) ×2 IMPLANT
MARKER SKIN DUAL TIP RULER LAB (MISCELLANEOUS) ×3 IMPLANT
NS IRRIG 1000ML POUR BTL (IV SOLUTION) ×5 IMPLANT
PACK GENERAL/GYN (CUSTOM PROCEDURE TRAY) ×3 IMPLANT
PAD ARMBOARD 7.5X6 YLW CONV (MISCELLANEOUS) ×5 IMPLANT
SPONGE LAP 18X18 RF (DISPOSABLE) ×10 IMPLANT
STAPLER VISISTAT 35W (STAPLE) ×3 IMPLANT
STRIP CLOSURE SKIN 1/2X4 (GAUZE/BANDAGES/DRESSINGS) IMPLANT
SUT ETHIBOND NAB CTX #1 30IN (SUTURE) IMPLANT
SUT ETHILON 2 0 FS 18 (SUTURE) IMPLANT
SUT MNCRL AB 3-0 PS2 18 (SUTURE) ×4 IMPLANT
SUT MNCRL AB 4-0 PS2 18 (SUTURE) ×6 IMPLANT
SUT MON AB 3-0 SH 27 (SUTURE) ×6
SUT MON AB 3-0 SH27 (SUTURE) IMPLANT
SUT MON AB 5-0 PS2 18 (SUTURE) ×10 IMPLANT
SUT PDS AB 2-0 CT1 27 (SUTURE) IMPLANT
SUT PDS AB 3-0 SH 27 (SUTURE) IMPLANT
SUT SILK 4 0 (SUTURE) ×3
SUT SILK 4 0 PS 2 (SUTURE) ×2 IMPLANT
SUT SILK 4-0 TF-4 18XBRD (SUTURE) IMPLANT
SUT VIC AB 3-0 PS2 18 (SUTURE)
SUT VIC AB 3-0 PS2 18XBRD (SUTURE) IMPLANT
SUT VIC AB 3-0 SH 18 (SUTURE) IMPLANT
SUT VICRYL 4-0 PS2 18IN ABS (SUTURE) IMPLANT
TOWEL OR 17X24 6PK STRL BLUE (TOWEL DISPOSABLE) ×5 IMPLANT
TOWEL OR 17X26 10 PK STRL BLUE (TOWEL DISPOSABLE) ×5 IMPLANT
TRAY FOLEY METER SIL LF 16FR (CATHETERS) ×4 IMPLANT
TRAY FOLEY MTR SLVR 14FR STAT (SET/KITS/TRAYS/PACK) IMPLANT
WATER STERILE IRR 1000ML POUR (IV SOLUTION) ×2 IMPLANT

## 2018-04-20 NOTE — Discharge Instructions (Signed)
Keep VAC in place and don't remove. Keep leg elevated as able. Drain Care daily. Sink bath or baby wipes. Don't get dressing wet. Follow up in office.

## 2018-04-20 NOTE — Transfer of Care (Signed)
Immediate Anesthesia Transfer of Care Note  Patient: Johnny [name]  Procedure(s) Performed: Excision of right inner thighs (Bilateral Thigh)  Patient Location: PACU  Anesthesia Type:General  Level of Consciousness: awake, alert  and oriented  Airway & Oxygen Therapy: Patient Spontanous Breathing and Patient connected to face mask oxygen  Post-op Assessment: Report given to RN and Post -op Vital signs reviewed and stable  Post vital signs: Reviewed and stable  Last Vitals:  Vitals Value Taken Time  BP 125/42 04/20/2018  5:09 PM  Temp    Pulse 76 04/20/2018  5:10 PM  Resp 16 04/20/2018  5:10 PM  SpO2 95 % 04/20/2018  5:10 PM  Vitals shown include unvalidated device data.  Last Pain:  Vitals:   04/20/18 1148  TempSrc:   PainSc: 0-No pain      Patients Stated Pain Goal: 2 (41/28/20 8138)  Complications: No apparent anesthesia complications

## 2018-04-20 NOTE — Op Note (Signed)
DATE OF OPERATION: 04/20/2018  LOCATION: [facility]  PREOPERATIVE DIAGNOSIS: right thigh lipodystrophy  POSTOPERATIVE DIAGNOSIS: Same  PROCEDURE: Excision of right thigh lipodystrophy  SURGEON: Lyndee Leo Sanger Hyrum Shaneyfelt, DO  ASSISTANT: Bonita Cox, RNFA  EBL: 250 cc  CONDITION: Stable  COMPLICATIONS: None  INDICATION: The patient, Johnny [name], is a 58 y.o. male born on 1960/10/20, is here for treatment of a chronic right thigh lipodystrophy effecting his ability to walk and exercise after massive weight reduction.  I had a long discussion with the patient and family prior to the start of the case.  That we would likely do one side today due to the side.  The patient agreed and asked for the right side to be done.  PROCEDURE DETAILS:  The patient was seen prior to surgery and marked.  The IV antibiotics were given. The patient was taken to the operating room and given a general anesthetic. A standard time out was performed and all information was confirmed by those in the room. SCDs were placed.   Both thighs were prepped and draped.  The legs were placed on boards for stabilization.  The right inner thigh was marked with a pen for resection.  Tumescent was placed in the anterior and posterior flap incision that we marked.  The bovie and #10 blade was used to make the incision anteriorly.  The bovie was then used to dissect through the fat to the posterior marked area.  As vessels were encountered they was clipped using medium and large clips.  Nerves were avoid and the dissect was kept superficial to the muscle fascia. Several times the area was checked to be sure we could get the edges closed prior to making the incision of the posterior flap.  The entire excess was then excised and was 11 pounds.  A drain was placed and secured with a 4-0 Silk.  The deep layers were closed with a 3-0 Monocryl.  The 4-0 Monocryl and 5-0 Monocryl was used for the subcuticular layers.  The prevena  was applied to the incision site.  The lower leg was wrapped with kerlex and an ace wrap.  The patient was allowed to wake up and taken to recovery room in stable condition at the end of the case. The family was notified at the end of the case.   The RNFA assisted throughout the case.  The RNFA was essential in retraction and counter traction when needed to make the case progress smoothly.  This retraction and assistance made it possible to see the tissue plans for the procedure.  The assistance was needed for blood control, tissue re-approximation and assisted with closure of the incision site.

## 2018-04-20 NOTE — Interval H&P Note (Signed)
History and Physical Interval Note:  04/20/2018 1:29 PM  Johnny [name]  has presented today for surgery, with the diagnosis of pannus of abdomen, excess skin of bilateral upper leg  The various methods of treatment have been discussed with the patient and family. After consideration of risks, benefits and other options for treatment, the patient has consented to  Procedure(s) with comments: Excision of bilateral inner thighs/panniculectomy (Bilateral) - please adjust case length to 210 or 240 min as a surgical intervention .  The patient's history has been reviewed, patient examined, no change in status, stable for surgery.  I have reviewed the patient's chart and labs.  Questions were answered to the patient's satisfaction.     Johnny [name]

## 2018-04-20 NOTE — Anesthesia Procedure Notes (Signed)
Procedure Name: Intubation Date/Time: 04/20/2018 2:05 PM Performed by: Teressa Lower., CRNA Pre-anesthesia Checklist: Patient identified, Emergency Drugs available, Suction available and Patient being monitored Patient Re-evaluated:Patient Re-evaluated prior to induction Oxygen Delivery Method: Circle system utilized Preoxygenation: Pre-oxygenation with 100% oxygen Induction Type: IV induction Ventilation: Mask ventilation without difficulty and Oral airway inserted - appropriate to patient size Laryngoscope Size: Mac and 4 Grade View: Grade I Tube type: Oral Tube size: 8.0 mm Number of attempts: 1 Airway Equipment and Method: Stylet and Oral airway Placement Confirmation: ETT inserted through vocal cords under direct vision,  positive ETCO2 and breath sounds checked- equal and bilateral Secured at: 23 cm Tube secured with: Tape Dental Injury: Teeth and Oropharynx as per pre-operative assessment

## 2018-04-21 ENCOUNTER — Encounter (HOSPITAL_COMMUNITY): Payer: Self-pay | Admitting: Plastic Surgery

## 2018-04-21 ENCOUNTER — Emergency Department (HOSPITAL_COMMUNITY)
Admission: EM | Admit: 2018-04-21 | Discharge: 2018-04-21 | Disposition: A | Discharge status: Home / Self Care | Payer: Medicaid Other | Attending: Emergency Medicine | Admitting: Emergency Medicine

## 2018-04-21 DIAGNOSIS — I129 Hypertensive chronic kidney disease with stage 1 through stage 4 chronic kidney disease, or unspecified chronic kidney disease: Secondary | ICD-10-CM | POA: Diagnosis not present

## 2018-04-21 DIAGNOSIS — Z79899 Other long term (current) drug therapy: Secondary | ICD-10-CM | POA: Diagnosis not present

## 2018-04-21 DIAGNOSIS — Z7982 Long term (current) use of aspirin: Secondary | ICD-10-CM | POA: Diagnosis not present

## 2018-04-21 DIAGNOSIS — Z5189 Encounter for other specified aftercare: Secondary | ICD-10-CM

## 2018-04-21 DIAGNOSIS — N184 Chronic kidney disease, stage 4 (severe): Secondary | ICD-10-CM | POA: Diagnosis not present

## 2018-04-21 DIAGNOSIS — S71101D Unspecified open wound, right thigh, subsequent encounter: Secondary | ICD-10-CM | POA: Insufficient documentation

## 2018-04-21 DIAGNOSIS — X58XXXD Exposure to other specified factors, subsequent encounter: Secondary | ICD-10-CM | POA: Diagnosis not present

## 2018-04-21 NOTE — Anesthesia Postprocedure Evaluation (Signed)
Anesthesia Post Note  Patient: Johnny [name]  Procedure(s) Performed: Excision of right inner thighs (Bilateral Thigh)     Patient location during evaluation: PACU Anesthesia Type: General Level of consciousness: awake and alert Pain management: pain level controlled Vital Signs Assessment: post-procedure vital signs reviewed and stable Respiratory status: spontaneous breathing, nonlabored ventilation, respiratory function stable and patient connected to nasal cannula oxygen Cardiovascular status: blood pressure returned to baseline and stable Postop Assessment: no apparent nausea or vomiting Anesthetic complications: no    Last Vitals:  Vitals:   04/20/18 1740 04/20/18 1750  BP: 113/66 108/63  Pulse: 65 61  Resp: 19 18  Temp: 36.5 C   SpO2: 97% 97%    Last Pain:  Vitals:   04/20/18 1716  TempSrc:   PainSc: 4                  [name]

## 2018-04-21 NOTE — ED Notes (Signed)
Per RN Aaron Edelman pt wants to stay in a wheel chair

## 2018-04-21 NOTE — ED Notes (Signed)
New Prevana Wound vac placed and appears to be working properly. Old wound vac given to patient to return with him on visit to his provider

## 2018-04-21 NOTE — ED Notes (Signed)
Patient verbalizes understanding of discharge instructions. Opportunity for questioning and answers were provided. Patient discharged from ED via w/c to POV

## 2018-04-21 NOTE — ED Provider Notes (Signed)
[facility] EMERGENCY DEPARTMENT Provider Note  CSN: [id number] Arrival date & time: 04/21/18 0044  Chief Complaint(s) Post-op Problem  HPI Johnny [name] is a 58 y.o. male with a history of obesity who had redundant tissue excision by plastic surgery earlier today and a wound VAC was placed.  Patient presents to the emergency department for replacement of the [facility] machine.  He reports that while trying to get up, the machine fell on the floor and broke.  He called the on-call provider who recommended coming to the emergency department.  Patient is endorsing postoperative discomfort without any other physical complaints.  HPI  Past Medical History Past Medical History:  Diagnosis Date  . Acquired lymphedema 12/13/2017  . Chronic kidney disease    Chronic Kidney disease-stage unknown  . Diverticulosis of large intestine without hemorrhage 01/31/2012  . Essential HTN 04/21/2006  . Hypertension   . OSA 05/21/2013   does not wear CPAP any longer  . 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 Acquired lymphedema, bilateral thighs 12/13/2017  . Scrotal mass 12/13/2017  . Cystocele, male   . Cellulitis of right thigh 12/12/2017  . Chronic kidney disease   . Hyperkalemia 08/27/2015  . Hyponatremia 08/27/2015  . Cellulitis of left lower extremity without foot 08/27/2015  . Sleep apnea 05/21/2013  . Severe obesity (BMI >= 40) ([place]) 12/02/2011  . Venous stasis of lower extremity 12/02/2011  . LVH (left ventricular hypertrophy) 07/28/2009  . 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 Essential hypertension 04/21/2006   Home Medication(s) Prior to Admission medications   Medication Sig Start Date End Date Taking? Authorizing Provider  aspirin 81 MG chewable tablet Chew 81 mg by mouth daily.      [provider]  carvedilol (COREG) 25 MG tablet  Take 1 tablet (25 mg total) by mouth 2 (two) times daily with a meal. 12/14/17   Guadalupe Dawn, MD  furosemide (LASIX) 40 MG tablet Take 1 tablet (40 mg total) by mouth daily. 10/22/15   [place] Bing, DO  hydrochlorothiazide (HYDRODIURIL) 25 MG tablet TAKE ONE TABLET BY MOUTH  DAILY Patient not taking: Reported on 04/13/2018 03/24/15   Patrecia Pour, MD  HYDROcodone-acetaminophen (NORCO) 10-325 MG tablet Take 1 tablet by mouth every 8 (eight) hours as needed. 04/14/18   Dillingham, Loel Lofty, DO  hydroxypropyl methylcellulose / hypromellose (ISOPTO TEARS / GONIOVISC) 2.5 % ophthalmic solution Place 1 drop into both eyes 2 (two) times daily as needed for dry eyes.    [provider]  RAYALDEE 30 MCG CPCR Take 30 mcg by mouth daily. 01/17/18   [provider]  spironolactone (ALDACTONE) 100 MG tablet Take 100 mg by mouth daily. 11/30/17   [provider]  Past Surgical History Past Surgical History:  Procedure Laterality Date  . COLONOSCOPY  01/31/2012   Procedure: COLONOSCOPY;  Surgeon: Inda Castle, MD;  Location: [facility] ENDOSCOPY;  Service: Endoscopy;  Laterality: N/A;  bmi is 56  . NO PAST SURGERIES     Family History Family History  Problem Relation Age of Onset  . Breast cancer Mother   . Hypertension Mother   . Colon cancer Father        died at 54  . Hypertension Brother     Social History Social History   Tobacco Use  . Smoking status: Never Smoker  . Smokeless tobacco: Never Used  Substance Use Topics  . Alcohol use: Not Currently    Comment: social-once a week  . Drug use: No    Comment: stopped marijuana about Sep or Oct 2013   Allergies Lisinopril  Review of Systems Review of Systems All other systems are reviewed and are negative for acute change except as noted in the HPI  Physical Exam Vital Signs  I  have reviewed the triage vital signs BP 137/82 (BP Location: Left Arm)   Pulse 65   Temp (!) 97.5 F (36.4 C) (Oral)   Resp 18   SpO2 98%   Physical Exam Vitals signs reviewed.  Constitutional:      General: He is not in acute distress.    Appearance: He is well-developed. He is not diaphoretic.     Comments: Obese  HENT:     Head: Normocephalic and atraumatic.     Jaw: No trismus.     Right Ear: External ear normal.     Left Ear: External ear normal.     Nose: Nose normal.  Eyes:     General: No scleral icterus.    Conjunctiva/sclera: Conjunctivae normal.  Neck:     Musculoskeletal: Normal range of motion.     Trachea: Phonation normal.  Cardiovascular:     Rate and Rhythm: Normal rate and regular rhythm.  Pulmonary:     Effort: Pulmonary effort is normal. No respiratory distress.     Breath sounds: No stridor.  Abdominal:     General: There is no distension.  Musculoskeletal: Normal range of motion.       Legs:  Neurological:     Mental Status: He is alert and oriented to person, place, and time.  Psychiatric:        Behavior: Behavior normal.     ED Results and Treatments Labs (all labs ordered are listed, but only abnormal results are displayed) Labs Reviewed - No data to display                                                                                                                       EKG  EKG Interpretation  Date/Time:    Ventricular Rate:    PR Interval:    QRS Duration:   QT Interval:    QTC Calculation:   R Axis:  Text Interpretation:        Radiology No results found. Pertinent labs & imaging results that were available during my care of the patient were reviewed by me and considered in my medical decision making (see chart for details).  Medications Ordered in ED Medications - No data to display                                                                                                                                    Procedures Procedures  (including critical care time)  Medical Decision Making / ED Course I have reviewed the nursing notes for this encounter and the patient's prior records (if available in EHR or on provided paperwork).    Wound VAC machine replaced and functioning appropriately.  JP drain drained 35 cc of serosanguineous fluid.  No need for intervention at this time.  The patient is safe for discharge with strict return precautions.   Final Clinical Impression(s) / ED Diagnoses Final diagnoses:  Visit for wound check   Disposition: Discharge  Condition: Good  I have discussed the results, Dx and Tx plan with the patient who expressed understanding and agree(s) with the plan. Discharge instructions discussed at great length. The patient was given strict return precautions who verbalized understanding of the instructions. No further questions at time of discharge.    ED Discharge Orders    None       Follow Up: Wallace Going, DO [place] [address] [place] [address] [phone]   as scheduled      This chart was dictated using voice recognition software.  Despite best efforts to proofread,  errors can occur which can change the documentation meaning.   Fatima Blank, MD 04/21/18 [phone]

## 2018-04-21 NOTE — Discharge Instructions (Addendum)
Please continue your postoperative wound care recommended by your plastic surgeon.

## 2018-04-21 NOTE — ED Triage Notes (Signed)
Pt had wound vac placed today post op.  At home pt dropped device and it was not functioning.  Pt called on call OR staff who stated to come to ED to get new one.  Device turned back on and connected to tubing. Device has no alerts on it and seems to be functioning properly. A&Ox4 no complaints

## 2018-04-28 ENCOUNTER — Ambulatory Visit (INDEPENDENT_AMBULATORY_CARE_PROVIDER_SITE_OTHER): Payer: Medicaid Other | Admitting: Plastic Surgery

## 2018-04-28 ENCOUNTER — Encounter: Payer: Self-pay | Admitting: Plastic Surgery

## 2018-04-28 VITALS — BP 137/88 | HR 71 | Temp 97.6°F | Ht 69.0 in | Wt 298.0 lb

## 2018-04-28 DIAGNOSIS — I890 Lymphedema, not elsewhere classified: Secondary | ICD-10-CM

## 2018-04-28 DIAGNOSIS — E881 Lipodystrophy, not elsewhere classified: Secondary | ICD-10-CM

## 2018-04-28 NOTE — Progress Notes (Signed)
   Subjective:    Patient ID: Johnny [name], male    DOB: 1960-10-13, 58 y.o.   MRN: [id number]  The patient is a 58 year old male here for follow-up on his right thigh excision.  Eleven pounds was removed.  An incisional VAC was placed.  When he stood up postop the VAC fell and came off.  He went to the ER and it was reapplied.  It has stayed on since.  I removed the VAC today.  The drain has too much output to remove today.  There is no sign of infection, hematoma or seroma.  Overall he is very pleased.  He has been wrapping his lower leg and an Ace wrap.   Review of Systems  Constitutional: Positive for activity change. Negative for appetite change.  HENT: Negative.   Eyes: Negative.   Respiratory: Negative.   Gastrointestinal: Negative.  Negative for abdominal pain.  Musculoskeletal: Negative.   Psychiatric/Behavioral: Negative.        Objective:   Physical Exam Vitals signs and nursing note reviewed.  Constitutional:      Appearance: Normal appearance.  HENT:     Head: Normocephalic and atraumatic.  Cardiovascular:     Rate and Rhythm: Normal rate.  Pulmonary:     Effort: Pulmonary effort is normal.  Neurological:     Mental Status: He is alert.  Psychiatric:        Mood and Affect: Mood normal.        Thought Content: Thought content normal.         Assessment & Plan:  Acquired lymphedema, bilateral thighs  Lipodystrophy  Continue with wrapping his lower leg.  We will continue with the drain for another week.  I would like to see him back in a week.  Call with any questions or concerns.  Xeroform to the right incision with dry gauze dressing.

## 2018-05-01 ENCOUNTER — Telehealth: Payer: Self-pay | Admitting: *Deleted

## 2018-05-01 NOTE — Telephone Encounter (Signed)
Called and spoke with the patient and he stated that everything has been taking care of.//AB/CMA

## 2018-05-01 NOTE — Telephone Encounter (Signed)
-----   Message from [facility] sent at 05/01/2018  8:23 AM EDT ----- Please call patient and advice him on bandaging

## 2018-05-05 ENCOUNTER — Other Ambulatory Visit: Payer: Self-pay

## 2018-05-05 ENCOUNTER — Encounter: Payer: Self-pay | Admitting: Plastic Surgery

## 2018-05-05 ENCOUNTER — Ambulatory Visit (INDEPENDENT_AMBULATORY_CARE_PROVIDER_SITE_OTHER): Payer: Medicaid Other | Admitting: Plastic Surgery

## 2018-05-05 VITALS — BP 130/84 | HR 73 | Temp 98.7°F | Ht 69.0 in | Wt 298.0 lb

## 2018-05-05 DIAGNOSIS — E881 Lipodystrophy, not elsewhere classified: Secondary | ICD-10-CM

## 2018-05-05 NOTE — Progress Notes (Signed)
   Subjective:    Patient ID: Johnny [name], male    DOB: [birth year], 58 y.o.   MRN: [id number]  The patient is a 58 year old male here for follow-up on his right thigh adipose excision.  The drain is still in place.  Drain output is too high for removal at this point.  Overall he is very pleased.  He is doing very well.  The incisions are closed.  There is some swelling around the medial right knee.  He is doing well to keep the wrap on the lower leg but cannot come up higher.  This would be difficult to keep in place.  There is no sign of infection.  He is washing his leg at the incision site with alcohol.   Review of Systems  Constitutional: Negative.   HENT: Negative.   Respiratory: Negative.   Cardiovascular: Negative.   Musculoskeletal: Negative.        Objective:   Physical Exam Vitals signs and nursing note reviewed.  Constitutional:      Appearance: Normal appearance.  HENT:     Head: Normocephalic and atraumatic.  Cardiovascular:     Rate and Rhythm: Normal rate.     Pulses: Normal pulses.  Pulmonary:     Effort: Pulmonary effort is normal.  Neurological:     Mental Status: He is alert.  Psychiatric:        Mood and Affect: Mood normal.        Thought Content: Thought content normal.        Judgment: Judgment normal.       Assessment & Plan:  Lipodystrophy  Recommend soap and water for washing the leg and incision area.  Then Vaseline and a dry gauze.  I would like to see him back in 1 week.  Call with any questions or concerns.  We will hopefully be able to take the drain out next week.

## 2018-05-10 ENCOUNTER — Telehealth: Payer: Self-pay | Admitting: *Deleted

## 2018-05-10 NOTE — Telephone Encounter (Signed)
Patient called on (05/08/18) stating that his leg tubing came out and he don't know what to do.  Called patient back and he stated that he went to the drug store and when he got back home he felt something running down his leg and it was fluid from the wound, and the tube had come out.  He said he put vaseline,gauze and tape on the area.  He stated that he didn't know if he needed to come in to be seen.  Asked the patient when is his next follow-up, and he stated this Friday.  Informed the patient that I will go speak with Dr.Dillingham.  Informed the patient that Dr. Marla Roe said it was okay that the tube has come out.  He is to continue using the vaseline,gauze, and tape and we will see him on Friday.  She also said that he can go ahead and take a shower.  Patient verbalized understanding and agreed.//AB/CMA

## 2018-05-12 ENCOUNTER — Other Ambulatory Visit: Payer: Self-pay

## 2018-05-12 ENCOUNTER — Encounter: Payer: Self-pay | Admitting: Plastic Surgery

## 2018-05-12 ENCOUNTER — Ambulatory Visit (INDEPENDENT_AMBULATORY_CARE_PROVIDER_SITE_OTHER): Payer: Medicaid Other | Admitting: Plastic Surgery

## 2018-05-12 VITALS — BP 129/85 | HR 71 | Temp 98.4°F | Ht 69.0 in | Wt 300.0 lb

## 2018-05-12 DIAGNOSIS — E65 Localized adiposity: Secondary | ICD-10-CM

## 2018-05-12 MED ORDER — DOXYCYCLINE HYCLATE 100 MG PO TABS: 100.0000 mg | ORAL_TABLET | Freq: Two times a day (BID) | ORAL | 0 refills | Status: AC

## 2018-05-12 NOTE — Progress Notes (Signed)
The patient is a 58 year old male here for follow-up on his right medial thigh panniculectomy.  Overall he is doing well.  The drain came out a few days ago.  He has a little bit of swelling distally and on the posterior aspect.  This is as expected.  I did put a needle in it and got about 10 cc of a little bit thick serous drainage.  He does not have a fever there is no redness there is no cellulitis.  I will call in an antibiotic to be safe.  Follow-up in 2 weeks unless he needs me sooner.  Continue with Vaseline to the area dry gauze and shower daily.

## 2018-05-26 ENCOUNTER — Encounter: Payer: Self-pay | Admitting: Plastic Surgery

## 2018-05-26 ENCOUNTER — Other Ambulatory Visit: Payer: Self-pay

## 2018-05-26 ENCOUNTER — Ambulatory Visit (INDEPENDENT_AMBULATORY_CARE_PROVIDER_SITE_OTHER): Payer: Medicaid Other | Admitting: Plastic Surgery

## 2018-05-26 VITALS — BP 153/95 | HR 79 | Temp 97.6°F | Ht 69.0 in | Wt 298.0 lb

## 2018-05-26 DIAGNOSIS — E881 Lipodystrophy, not elsewhere classified: Secondary | ICD-10-CM

## 2018-05-26 NOTE — Progress Notes (Signed)
The patient is a 58 year old male here for follow-up on his right thigh panniculectomy.  He has had a little bit of opening at the incision site in 3 places.  It is about a centimeter in size.  It appears to be superficial.  He has been using hydrogen peroxide and witch hazel to keep it clean.  I have asked him to stop using that and just use Dial soap.  He is very pleased with his progress.  The area does appear to be getting a little bit softer.  There is still some swelling as expected.  Nothing appears to be infected.  I recommend Vaseline and can shower and get it wet.  Call me with any change otherwise I will see him back in 1 month.

## 2018-06-27 ENCOUNTER — Other Ambulatory Visit: Payer: Self-pay

## 2018-06-27 ENCOUNTER — Ambulatory Visit (INDEPENDENT_AMBULATORY_CARE_PROVIDER_SITE_OTHER): Payer: Medicaid Other | Admitting: Plastic Surgery

## 2018-06-27 ENCOUNTER — Encounter: Payer: Self-pay | Admitting: Plastic Surgery

## 2018-06-27 VITALS — BP 140/88 | HR 79 | Temp 98.7°F | Ht 69.0 in | Wt 311.6 lb

## 2018-06-27 DIAGNOSIS — E881 Lipodystrophy, not elsewhere classified: Secondary | ICD-10-CM

## 2018-06-27 NOTE — Progress Notes (Signed)
   Subjective:    Patient ID: Johnny [name], male    DOB: 1960-05-21, 58 y.o.   MRN: [id number]  The patient is a 58 year old male here for follow-up on his right thigh panniculectomy.  He has completely healed.  There is no drainage.  The skin is softening and the hyperpigmentation is improving.  He is thrilled with the results.  There is no sign of infection or seroma.  He is being really good about compression stockings and tight.   Review of Systems  Constitutional: Negative for activity change.  HENT: Negative.   Eyes: Negative.   Respiratory: Negative for chest tightness and shortness of breath.   Cardiovascular: Positive for leg swelling.  Gastrointestinal: Negative.   Genitourinary: Negative.   Musculoskeletal: Negative.   Skin: Positive for color change. Negative for wound.  Psychiatric/Behavioral: Negative.        Objective:   Physical Exam Vitals signs and nursing note reviewed.  Constitutional:      Appearance: Normal appearance.  HENT:     Head: Normocephalic and atraumatic.  Cardiovascular:     Rate and Rhythm: Normal rate.     Pulses: Normal pulses.  Pulmonary:     Effort: Pulmonary effort is normal.  Skin:    General: Skin is warm.  Neurological:     General: No focal deficit present.     Mental Status: He is alert.  Psychiatric:        Mood and Affect: Mood normal.        Behavior: Behavior normal.        Assessment & Plan:  Lipodystrophy  We are thrilled with his progress.  Follow-up in July and we will talk about surgery for the left thigh. Picture taken and placed in chart with patient permission.

## 2018-08-07 DIAGNOSIS — N184 Chronic kidney disease, stage 4 (severe): Secondary | ICD-10-CM | POA: Diagnosis not present

## 2018-08-14 DIAGNOSIS — I129 Hypertensive chronic kidney disease with stage 1 through stage 4 chronic kidney disease, or unspecified chronic kidney disease: Secondary | ICD-10-CM | POA: Diagnosis not present

## 2018-08-14 DIAGNOSIS — D631 Anemia in chronic kidney disease: Secondary | ICD-10-CM | POA: Diagnosis not present

## 2018-08-14 DIAGNOSIS — N184 Chronic kidney disease, stage 4 (severe): Secondary | ICD-10-CM | POA: Diagnosis not present

## 2018-08-14 DIAGNOSIS — N2581 Secondary hyperparathyroidism of renal origin: Secondary | ICD-10-CM | POA: Diagnosis not present

## 2018-09-05 ENCOUNTER — Ambulatory Visit (INDEPENDENT_AMBULATORY_CARE_PROVIDER_SITE_OTHER): Payer: Medicaid Other | Admitting: Plastic Surgery

## 2018-09-05 ENCOUNTER — Other Ambulatory Visit: Payer: Self-pay

## 2018-09-05 ENCOUNTER — Encounter: Payer: Self-pay | Admitting: Plastic Surgery

## 2018-09-05 VITALS — BP 138/93 | HR 79 | Temp 97.7°F | Ht 69.0 in | Wt 316.6 lb

## 2018-09-05 DIAGNOSIS — E881 Lipodystrophy, not elsewhere classified: Secondary | ICD-10-CM | POA: Diagnosis not present

## 2018-09-05 NOTE — Progress Notes (Signed)
   Subjective:    Patient ID: Johnny [name], male    DOB: 1961/02/10, 58 y.o.   MRN: [id number]  The patient is a 58 yrs old bm here for follow up and further evaluation of his thighs.  He underwent a resection of the right medial thigh Feb 2020.  He has done extremely well.  He is completely healed.  There has been significant improvement in the lower leg swelling and pain.  He has been able to be more active and his gait is improving.  The weight has not changed significantly with the quarantine and the gyms closed.  He is now interested in having the left side resected.  There is swelling and firmness in the tissue.  Pictures show the condition well.       Review of Systems  Constitutional: Positive for activity change. Negative for appetite change.  HENT: Negative for dental problem.   Eyes: Negative.   Respiratory: Negative for chest tightness and shortness of breath.   Cardiovascular: Positive for leg swelling.  Gastrointestinal: Negative for abdominal pain and constipation.  Genitourinary: Positive for scrotal swelling.  Musculoskeletal: Positive for back pain and gait problem.  Hematological: Negative.   Psychiatric/Behavioral: Negative.        Objective:   Physical Exam Vitals signs and nursing note reviewed.  Constitutional:      Appearance: Normal appearance.  HENT:     Head: Normocephalic.     Nose: Nose normal.  Eyes:     Extraocular Movements: Extraocular movements intact.     Pupils: Pupils are equal, round, and reactive to light.  Neck:     Musculoskeletal: Normal range of motion.  Cardiovascular:     Rate and Rhythm: Normal rate.     Pulses: Normal pulses.  Pulmonary:     Effort: Pulmonary effort is normal. No respiratory distress.  Abdominal:     General: Abdomen is flat. There is no distension.     Tenderness: There is no abdominal tenderness.  Musculoskeletal:        General: Swelling present. No deformity.  Skin:    General: Skin is warm.   Neurological:     General: No focal deficit present.     Mental Status: He is alert. Mental status is at baseline.  Psychiatric:        Mood and Affect: Mood normal.        Behavior: Behavior normal.        Thought Content: Thought content normal.        Assessment & Plan:     ICD-10-CM   1. Lipodystrophy  E88.1     Recommend excision of left thigh lipodystrophy May need 23 hours observation. Pictures were obtained of the patient and placed in the chart with the patient's or guardian's permission.

## 2018-09-25 NOTE — H&P (View-Only) (Signed)
Patient ID: Johnny [name], male    DOB: 1960/05/11, 58 y.o.   MRN: [id number]  Chief Complaint  Patient presents with  . Pre-op Exam    excision of (L) thigh lipodystrophy      ICD-10-CM   1. 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  male  with a history of lipodystrophy. He underwent a right medial thigh resection in February 2020.  He presents for preoperative evaluation for upcoming procedure, excision of left thigh lipodystrophy, scheduled for 10/11/18 with Dr. Marla Roe.  He has a history of PE in 2011, OSA, HTN, CKD. Pre-albumin 27.2 on 04/14/18. No FMHx of blood clots, dvt, pe.  He reports feeling well overall. No recent illnesses or colds. He continues to eat healthy. He spoke with his nephrologist in regards to surgery and reported there were no issues with this and would f/u with him after surgery.   The patient has not had problems with anesthesia. He does not smoke.  Past Medical History: Allergies: Allergies  Allergen Reactions  . Lisinopril Swelling    Angioedema   Current Medications:  Current Outpatient Medications:  .  aspirin 81 MG chewable tablet, Chew 81 mg by mouth daily.  , Disp: , Rfl:  .  carvedilol (COREG) 25 MG tablet, Take 1 tablet (25 mg total) by mouth 2 (two) times daily with a meal., Disp: 30 tablet, Rfl: 0 .  furosemide (LASIX) 40 MG tablet, Take 1 tablet (40 mg total) by mouth daily., Disp: 10 tablet, Rfl: 0 .  hydrochlorothiazide (HYDRODIURIL) 25 MG tablet, TAKE ONE TABLET BY MOUTH  DAILY, Disp: 90 tablet, Rfl: 0 .  RAYALDEE 30 MCG CPCR, Take 30 mcg by mouth daily., Disp: , Rfl: 1 .  spironolactone (ALDACTONE) 100 MG tablet, Take 100 mg by mouth daily., Disp: , Rfl: 9  Past Medical Problems: Past Medical History:  Diagnosis Date  . Acquired lymphedema 12/13/2017  . Chronic kidney disease    Chronic Kidney disease-stage unknown  . Diverticulosis of large intestine without hemorrhage 01/31/2012  .  Essential HTN 04/21/2006  . Hypertension   . OSA 05/21/2013   does not wear CPAP any longer  . 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 COLONOSCOPY  01/31/2012   Procedure: COLONOSCOPY;  Surgeon: Inda Castle, MD;  Location: [facility];  Service: Endoscopy;  Laterality: N/A;  bmi is 57  . NO PAST SURGERIES    . PANNICULECTOMY Bilateral 04/20/2018   Procedure: Excision of right inner thighs;  Surgeon: Wallace Going, DO;  Location: [facility];  Service: Plastics;  Laterality: Bilateral;  Excision of right inner thighs    Social History: Social History   Socioeconomic History  . Marital status: Single    Spouse name: Not on file  . Number of children: 1  . Years of education: Not on file  . Highest education level: Not on file  Occupational History  . Occupation: disabled    Fish farm manager: UNEMPLOYED  Social Needs  . Financial resource strain: Not on file  . Food insecurity    Worry: Not on file    Inability: Not on file  . Transportation needs    Medical: Not on file    Non-medical: Not on file  Tobacco Use  . Smoking status: Never Smoker  . Smokeless tobacco: Never Used  Substance and Sexual Activity  . Alcohol  use: Not Currently    Comment: social-once a week  . Drug use: No    Comment: stopped marijuana about Sep or Oct 2013  . Sexual activity: Not on file  Lifestyle  . Physical activity    Days per week: Not on file    Minutes per session: Not on file  . Stress: Not on file  Relationships  . Social Herbalist on phone: Not on file    Gets together: Not on file    Attends religious service: Not on file    Active member of club or organization: Not on file    Attends meetings of clubs or organizations: Not on file    Relationship status: Not on file  . Intimate partner violence    Fear of current or ex partner: Not on file    Emotionally abused: Not on file    Physically abused:  Not on file    Forced sexual activity: Not on file  Other Topics Concern  . Not on file  Social History Narrative  . Not on file    Family History: Family History  Problem Relation Age of Onset  . Breast cancer Mother   . Hypertension Mother   . Colon cancer Father        died at 19  . Hypertension Brother     Review of Systems: Review of Systems  Constitutional: Positive for weight loss. Negative for chills and fever.  HENT: Negative.   Eyes: Negative.   Respiratory: Negative.   Cardiovascular: Negative.   Gastrointestinal: Negative.   Genitourinary: Negative.   Musculoskeletal: Positive for joint pain (L knee). Negative for back pain, myalgias and neck pain.  Skin: Negative.  Negative for itching and rash.  Neurological: Negative.   Psychiatric/Behavioral: Negative.    Physical Exam: Vital Signs BP (!) 145/87 (BP Location: Left Arm, Patient Position: Sitting, Cuff Size: Large)   Pulse 74   Temp (!) 97.5 F (36.4 C) (Temporal)   Ht 5\' 9"  (1.753 m)   Wt (!) 319 lb 3.2 oz (144.8 kg)   SpO2 97%   BMI 47.14 kg/m   Physical Exam Constitutional:      General: He is not in acute distress.    Appearance: Normal appearance. He is obese. He is not ill-appearing or toxic-appearing.  HENT:     Head: Normocephalic and atraumatic.  Eyes:     Pupils: Pupils are equal, round, and reactive to light.  Neck:     Musculoskeletal: Normal range of motion and neck supple. No neck rigidity or muscular tenderness.  Cardiovascular:     Rate and Rhythm: Normal rate and regular rhythm.     Pulses: Normal pulses.     Heart sounds: Normal heart sounds.  Pulmonary:     Effort: Pulmonary effort is normal. No respiratory distress.     Breath sounds: Normal breath sounds. No wheezing.  Chest:     Chest wall: No tenderness.  Abdominal:     General: Abdomen is flat. Bowel sounds are normal. There is no distension.     Palpations: Abdomen is soft.     Tenderness: There is no abdominal  tenderness.  Musculoskeletal: Normal range of motion.        General: No swelling or tenderness.     Right lower leg: No edema.     Left lower leg: No edema.       Legs:     Comments: Compression socks on.   Lymphadenopathy:  Cervical: No cervical adenopathy.  Skin:    General: Skin is warm and dry.     Capillary Refill: Capillary refill takes less than 2 seconds.  Neurological:     General: No focal deficit present.     Mental Status: He is alert and oriented to person, place, and time. Mental status is at baseline.  Psychiatric:        Mood and Affect: Mood normal.        Behavior: Behavior normal.        Thought Content: Thought content normal.        Judgment: Judgment normal.    Assessment/Plan: Johnny [name] is scheduled for Excision of left thigh lipodystrophy with Dr. Marla Roe on 10/11/18.  Risks, benefits, and alternatives of procedure discussed, questions answered and consent obtained.    Caprini score of 7. Patient previously had a panniculectomy of his right thigh on 04/20/18 and did not require any ppx measures other than compression SEDs. We extensively talked about this and he feels comfortable with the current plan of SEDs and being active post-operatively. He reports last time he had no difficulty on POD1 ambulating and staying active.  He is aware to stop his aspirin day before and day of surgery, which he did prior to his last surgery in February.  He is scheduled for COVID test on 8/15 Scheduled for labs, etc at [facility] on 8/12.  The risks that can be encountered with and after excision of skin were discussed and include the following but not limited to these: bleeding, infection, delayed healing, anesthesia risks, skin sensation changes, injury to structures including nerves, blood vessels, and muscles which may be temporary or permanent, allergies to tape, suture materials and glues, blood products, topical preparations or injected agents, skin contour  irregularities, skin discoloration and swelling, deep vein thrombosis, cardiac and pulmonary complications, pain, which may persist, persistent pain, recurrence of the lesion, poor healing of the incision, possible need for revisional surgery or staged procedures.    Electronically signed by: Carola Rhine Jafeth Mustin, PA-C 09/26/2018 9:12 AM

## 2018-09-25 NOTE — Progress Notes (Signed)
Patient ID: Johnny [name], male    DOB: 20-Sep-1960, 58 y.o.   MRN: [id number]  Chief Complaint  Patient presents with  . Pre-op Exam    excision of (L) thigh lipodystrophy      ICD-10-CM   1. 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  male  with a history of lipodystrophy. Johnny [name] underwent a right medial thigh resection in February 2020.  Johnny [name] presents for preoperative evaluation for upcoming procedure, excision of left thigh lipodystrophy, scheduled for 10/11/18 with Dr. Marla Roe.  Johnny [name] has a history of PE in 2011, OSA, HTN, CKD. Pre-albumin 27.2 on 04/14/18. No FMHx of blood clots, dvt, pe.  Johnny [name] reports feeling well overall. No recent illnesses or colds. Johnny [name] continues to eat healthy. Johnny [name] spoke with his nephrologist in regards to surgery and reported there were no issues with this and would f/u with him after surgery.   The patient has not had problems with anesthesia. Johnny [name] does not smoke.  Past Medical History: Allergies: Allergies  Allergen Reactions  . Lisinopril Swelling    Angioedema   Current Medications:  Current Outpatient Medications:  .  aspirin 81 MG chewable tablet, Chew 81 mg by mouth daily.  , Disp: , Rfl:  .  carvedilol (COREG) 25 MG tablet, Take 1 tablet (25 mg total) by mouth 2 (two) times daily with a meal., Disp: 30 tablet, Rfl: 0 .  furosemide (LASIX) 40 MG tablet, Take 1 tablet (40 mg total) by mouth daily., Disp: 10 tablet, Rfl: 0 .  hydrochlorothiazide (HYDRODIURIL) 25 MG tablet, TAKE ONE TABLET BY MOUTH  DAILY, Disp: 90 tablet, Rfl: 0 .  RAYALDEE 30 MCG CPCR, Take 30 mcg by mouth daily., Disp: , Rfl: 1 .  spironolactone (ALDACTONE) 100 MG tablet, Take 100 mg by mouth daily., Disp: , Rfl: 9  Past Medical Problems: Past Medical History:  Diagnosis Date  . Acquired lymphedema 12/13/2017  . Chronic kidney disease    Chronic Kidney disease-stage unknown  . Diverticulosis of large intestine without hemorrhage 01/31/2012  .  Essential HTN 04/21/2006  . Hypertension   . OSA 05/21/2013   does not wear CPAP any longer  . 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 COLONOSCOPY  01/31/2012   Procedure: COLONOSCOPY;  Surgeon: Inda Castle, MD;  Location: [facility];  Service: Endoscopy;  Laterality: N/A;  bmi is 27  . NO PAST SURGERIES    . PANNICULECTOMY Bilateral 04/20/2018   Procedure: Excision of right inner thighs;  Surgeon: Wallace Going, DO;  Location: [facility];  Service: Plastics;  Laterality: Bilateral;  Excision of right inner thighs    Social History: Social History   Socioeconomic History  . Marital status: Single    Spouse name: Not on file  . Number of children: 1  . Years of education: Not on file  . Highest education level: Not on file  Occupational History  . Occupation: disabled    Fish farm manager: UNEMPLOYED  Social Needs  . Financial resource strain: Not on file  . Food insecurity    Worry: Not on file    Inability: Not on file  . Transportation needs    Medical: Not on file    Non-medical: Not on file  Tobacco Use  . Smoking status: Never Smoker  . Smokeless tobacco: Never Used  Substance and Sexual Activity  . Alcohol  use: Not Currently    Comment: social-once a week  . Drug use: No    Comment: stopped marijuana about Sep or Oct 2013  . Sexual activity: Not on file  Lifestyle  . Physical activity    Days per week: Not on file    Minutes per session: Not on file  . Stress: Not on file  Relationships  . Social Herbalist on phone: Not on file    Gets together: Not on file    Attends religious service: Not on file    Active member of club or organization: Not on file    Attends meetings of clubs or organizations: Not on file    Relationship status: Not on file  . Intimate partner violence    Fear of current or ex partner: Not on file    Emotionally abused: Not on file    Physically abused:  Not on file    Forced sexual activity: Not on file  Other Topics Concern  . Not on file  Social History Narrative  . Not on file    Family History: Family History  Problem Relation Age of Onset  . Breast cancer Mother   . Hypertension Mother   . Colon cancer Father        died at 26  . Hypertension Brother     Review of Systems: Review of Systems  Constitutional: Positive for weight loss. Negative for chills and fever.  HENT: Negative.   Eyes: Negative.   Respiratory: Negative.   Cardiovascular: Negative.   Gastrointestinal: Negative.   Genitourinary: Negative.   Musculoskeletal: Positive for joint pain (L knee). Negative for back pain, myalgias and neck pain.  Skin: Negative.  Negative for itching and rash.  Neurological: Negative.   Psychiatric/Behavioral: Negative.    Physical Exam: Vital Signs BP (!) 145/87 (BP Location: Left Arm, Patient Position: Sitting, Cuff Size: Large)   Pulse 74   Temp (!) 97.5 F (36.4 C) (Temporal)   Ht 5\' 9"  (1.753 m)   Wt (!) 319 lb 3.2 oz (144.8 kg)   SpO2 97%   BMI 47.14 kg/m   Physical Exam Constitutional:      General: Johnny [name] is not in acute distress.    Appearance: Normal appearance. Johnny [name] is obese. Johnny [name] is not ill-appearing or toxic-appearing.  HENT:     Head: Normocephalic and atraumatic.  Eyes:     Pupils: Pupils are equal, round, and reactive to light.  Neck:     Musculoskeletal: Normal range of motion and neck supple. No neck rigidity or muscular tenderness.  Cardiovascular:     Rate and Rhythm: Normal rate and regular rhythm.     Pulses: Normal pulses.     Heart sounds: Normal heart sounds.  Pulmonary:     Effort: Pulmonary effort is normal. No respiratory distress.     Breath sounds: Normal breath sounds. No wheezing.  Chest:     Chest wall: No tenderness.  Abdominal:     General: Abdomen is flat. Bowel sounds are normal. There is no distension.     Palpations: Abdomen is soft.     Tenderness: There is no abdominal  tenderness.  Musculoskeletal: Normal range of motion.        General: No swelling or tenderness.     Right lower leg: No edema.     Left lower leg: No edema.       Legs:     Comments: Compression socks on.   Lymphadenopathy:  Cervical: No cervical adenopathy.  Skin:    General: Skin is warm and dry.     Capillary Refill: Capillary refill takes less than 2 seconds.  Neurological:     General: No focal deficit present.     Mental Status: Johnny [name] is alert and oriented to person, place, and time. Mental status is at baseline.  Psychiatric:        Mood and Affect: Mood normal.        Behavior: Behavior normal.        Thought Content: Thought content normal.        Judgment: Judgment normal.    Assessment/Plan: Johnny [name] is scheduled for Excision of left thigh lipodystrophy with Dr. Marla Roe on 10/11/18.  Risks, benefits, and alternatives of procedure discussed, questions answered and consent obtained.    Caprini score of 7. Patient previously had a panniculectomy of his right thigh on 04/20/18 and did not require any ppx measures other than compression SEDs. We extensively talked about this and Johnny [name] feels comfortable with the current plan of SEDs and being active post-operatively. Johnny [name] reports last time Johnny [name] had no difficulty on POD1 ambulating and staying active.  Johnny [name] is aware to stop his aspirin day before and day of surgery, which Johnny [name] did prior to his last surgery in February.  Johnny [name] is scheduled for COVID test on 8/15 Scheduled for labs, etc at [facility] on 8/12.  The risks that can be encountered with and after excision of skin were discussed and include the following but not limited to these: bleeding, infection, delayed healing, anesthesia risks, skin sensation changes, injury to structures including nerves, blood vessels, and muscles which may be temporary or permanent, allergies to tape, suture materials and glues, blood products, topical preparations or injected agents, skin contour  irregularities, skin discoloration and swelling, deep vein thrombosis, cardiac and pulmonary complications, pain, which may persist, persistent pain, recurrence of the lesion, poor healing of the incision, possible need for revisional surgery or staged procedures.    Electronically signed by: Carola Rhine Aldrin Engelhard, PA-C 09/26/2018 9:12 AM

## 2018-09-26 ENCOUNTER — Other Ambulatory Visit: Payer: Self-pay

## 2018-09-26 ENCOUNTER — Encounter: Payer: Medicaid Other | Admitting: Surgical

## 2018-09-26 ENCOUNTER — Ambulatory Visit (INDEPENDENT_AMBULATORY_CARE_PROVIDER_SITE_OTHER): Payer: Medicaid Other | Admitting: Surgical

## 2018-09-26 ENCOUNTER — Encounter: Payer: Self-pay | Admitting: Surgical

## 2018-09-26 VITALS — BP 145/87 | HR 74 | Temp 97.5°F | Ht 69.0 in | Wt 319.2 lb

## 2018-09-26 DIAGNOSIS — E881 Lipodystrophy, not elsewhere classified: Secondary | ICD-10-CM

## 2018-09-26 MED ORDER — ONDANSETRON HCL 4 MG PO TABS: 4.0000 mg | ORAL_TABLET | Freq: Three times a day (TID) | ORAL | 0 refills | Status: AC | PRN

## 2018-09-26 MED ORDER — HYDROCODONE-ACETAMINOPHEN 5-325 MG PO TABS: 1.0000 | ORAL_TABLET | Freq: Three times a day (TID) | ORAL | 0 refills | Status: AC | PRN

## 2018-09-26 MED ORDER — CEPHALEXIN 500 MG PO CAPS: 500.0000 mg | ORAL_CAPSULE | Freq: Four times a day (QID) | ORAL | 0 refills | Status: AC

## 2018-09-26 NOTE — Addendum Note (Signed)
Addended by: Wallace Going on: 09/26/2018 05:53 PM   Modules accepted: Orders

## 2018-10-03 NOTE — Pre-Procedure Instructions (Signed)
[place] ([place]), [place] - [address] [place]) [place] [address] Phone: [phone] Fax: [place] [address] [place]), [address] [place] ([place])  [address] Phone: [phone] Fax: [phone]      Your procedure is scheduled on  10-11-18  Report to [facility] Main Entrance "A" at  [place].M., and check in at the Admitting office.  Call this number if you have problems the morning of surgery:  [phone]  Call [phone] if you have any questions prior to your surgery date Monday-Friday 8am-4pm    Remember:  Do not eat or drink after midnight the night before your surgery   Take these medicines the morning of surgery with A SIP OF WATER : carvedilol (COREG) HYDROcodone-acetaminophen (NORCO)as needed  Follow your surgeon's instructions on when to stop Aspirin.  If no instructions were given by your surgeon then you will need to call the office to get those instructions.    7 days prior to surgery STOP taking any Aspirin (unless otherwise instructed by your surgeon), Aleve, Naproxen, Ibuprofen, Motrin, Advil, Goody's, BC's, all herbal medications, fish oil, and all vitamins.   The Morning of Surgery  Do not wear jewelry, make-up or nail polish.  Do not wear lotions, powders, or colognes, or deodorant  Men may shave face and neck.  Do not bring valuables to the hospital.  [facility] is not responsible for any belongings or valuables.  If you are a smoker, DO NOT Smoke 24 hours prior to surgery IF you wear a CPAP at night please bring your mask, tubing, and machine the morning of surgery   Remember that you must have someone to transport you home after your surgery, and remain with you for 24 hours if you are discharged the same day.  Contacts, glasses, hearing aids, dentures or bridgework may not be worn into surgery.   Leave your suitcase in the  car.  After surgery it may be brought to your room.  For patients admitted to the hospital, discharge time will be determined by your treatment team.  Patients discharged the day of surgery will not be allowed to drive home.    Special instructions:   - Preparing For Surgery  Before surgery, you can play an important role. Because skin is not sterile, your skin needs to be as free of germs as possible. You can reduce the number of germs on your skin by washing with CHG (chlorahexidine gluconate) Soap before surgery.  CHG is an antiseptic cleaner which kills germs and bonds with the skin to continue killing germs even after washing.    Oral Hygiene is also important to reduce your risk of infection.  Remember - BRUSH YOUR TEETH THE MORNING OF SURGERY WITH YOUR REGULAR TOOTHPASTE  Please do not use if you have an allergy to CHG or antibacterial soaps. If your skin becomes reddened/irritated stop using the CHG.  Do not shave (including legs and underarms) for at least 48 hours prior to first CHG shower. It is OK to shave your face.  Please follow these instructions carefully.   1. Shower the NIGHT BEFORE SURGERY and the MORNING OF SURGERY with CHG Soap.   2. If you chose to wash your hair, wash your hair first as usual with your normal shampoo.  3. After you shampoo, rinse your hair and body thoroughly to remove the shampoo.  4.  Use CHG as you would any other liquid soap. You can apply CHG directly to the skin and wash gently with a scrungie or a clean washcloth.   5. Apply the CHG Soap to your body ONLY FROM THE NECK DOWN.  Do not use on open wounds or open sores. Avoid contact with your eyes, ears, mouth and genitals (private parts). Wash Face and genitals (private parts)  with your normal soap.   6. Wash thoroughly, paying special attention to the area where your surgery will be performed.  7. Thoroughly rinse your body with warm water from the neck down.  8. DO NOT  shower/wash with your normal soap after using and rinsing off the CHG Soap.  9. Pat yourself dry with a CLEAN TOWEL.  10. Wear CLEAN PAJAMAS to bed the night before surgery, wear comfortable clothes the morning of surgery  11. Place CLEAN SHEETS on your bed the night of your first shower and DO NOT SLEEP WITH PETS.  Day of Surgery:  Do not apply any deodorants/lotions. Please shower the morning of surgery with the CHG soap  Please wear clean clothes to the hospital/surgery center.   Remember to brush your teeth WITH YOUR REGULAR TOOTHPASTE.   Please read over the  fact sheets that you were given.

## 2018-10-04 ENCOUNTER — Encounter (HOSPITAL_COMMUNITY): Payer: Self-pay

## 2018-10-04 ENCOUNTER — Encounter (HOSPITAL_COMMUNITY)
Admission: RE | Admit: 2018-10-04 | Discharge: 2018-10-04 | Disposition: A | Discharge status: Home / Self Care | Payer: Medicaid Other | Source: Ambulatory Visit | Attending: Plastic Surgery | Admitting: Plastic Surgery

## 2018-10-04 ENCOUNTER — Other Ambulatory Visit: Payer: Self-pay

## 2018-10-04 DIAGNOSIS — Z01812 Encounter for preprocedural laboratory examination: Secondary | ICD-10-CM | POA: Diagnosis not present

## 2018-10-04 HISTORY — DX: Presence of dental prosthetic device (complete) (partial): Z97.2

## 2018-10-04 HISTORY — DX: Excessive and redundant skin and subcutaneous tissue: L98.7

## 2018-10-04 LAB — CBC
HCT: 36.9 % — ABNORMAL LOW (ref 39.0–52.0)
Hemoglobin: 12.3 g/dL — ABNORMAL LOW (ref 13.0–17.0)
MCH: 29.9 pg (ref 26.0–34.0)
MCHC: 33.3 g/dL (ref 30.0–36.0)
MCV: 89.8 fL (ref 80.0–100.0)
Platelets: 216 10*3/uL (ref 150–400)
RBC: 4.11 MIL/uL — ABNORMAL LOW (ref 4.22–5.81)
RDW: 13.5 % (ref 11.5–15.5)
WBC: 4.8 10*3/uL (ref 4.0–10.5)
nRBC: 0 % (ref 0.0–0.2)

## 2018-10-04 LAB — BASIC METABOLIC PANEL
Anion gap: 12 (ref 5–15)
BUN: 43 mg/dL — ABNORMAL HIGH (ref 6–20)
CO2: 21 mmol/L — ABNORMAL LOW (ref 22–32)
Calcium: 8.9 mg/dL (ref 8.9–10.3)
Chloride: 104 mmol/L (ref 98–111)
Creatinine, Ser: 3.94 mg/dL — ABNORMAL HIGH (ref 0.61–1.24)
GFR calc Af Amer: 18 mL/min — ABNORMAL LOW (ref >60)
GFR calc non Af Amer: 16 mL/min — ABNORMAL LOW (ref >60)
Glucose, Bld: 116 mg/dL — ABNORMAL HIGH (ref 70–99)
Potassium: 4.6 mmol/L (ref 3.5–5.1)
Sodium: 137 mmol/L (ref 135–145)

## 2018-10-04 NOTE — Progress Notes (Signed)
Pt chart forwarded to PA, Anesthesiology, for review of abnormal labs and EKG.

## 2018-10-04 NOTE — Progress Notes (Signed)
Pt denies SOB, chest pain, and being under the care of a cardiologist. Pt PCP is Dr. Harriet Butte.  Pt denies having a cardiac cath and stress test. Pt denies recent labs. Pt denies having a chest x ray within the last year.  Pt stated that he was instructed to hold Aspirin the DOS. Pt denies that he and family members tested positive for COVID-19; pt scheduled to be tested 10/07/18  and reminded to quarantine.  Coronavirus Screening  Pt denies that he and family members experienced the following symptoms:  Cough yes/no: No Fever (>100.42F)  yes/no: No Runny nose yes/no: No Sore throat yes/no: No Difficulty breathing/shortness of breath  yes/no: No  Have you or a family member traveled in the last 14 days and where? yes/no: No  Pt reminded that hospital visitation restrictions are in effect and the importance of the restrictions.   Pt verbalized understanding of all pre-op instructions.

## 2018-10-05 NOTE — Anesthesia Preprocedure Evaluation (Addendum)
Anesthesia Evaluation  Patient identified by MRN, date of birth, ID band Patient awake    Reviewed: Allergy & Precautions, NPO status , Patient's Chart, lab work & pertinent test results  Airway Mallampati: I  TM Distance: >3 FB Neck ROM: Full    Dental  (+) Edentulous Upper, Edentulous Lower   Pulmonary sleep apnea and Continuous Positive Airway Pressure Ventilation ,    Pulmonary exam normal breath sounds clear to auscultation       Cardiovascular hypertension, Pt. on medications Normal cardiovascular exam Rhythm:Regular Rate:Normal     Neuro/Psych negative neurological ROS  negative psych ROS   GI/Hepatic negative GI ROS, Neg liver ROS,   Endo/Other  Morbid obesity  Renal/GU Renal InsufficiencyRenal disease  negative genitourinary   Musculoskeletal Lipodystrophy   Abdominal (+) + obese,   Peds  Hematology  (+) anemia ,   Anesthesia Other Findings   Reproductive/Obstetrics                           Anesthesia Physical Anesthesia Plan  ASA: III  Anesthesia Plan: General   Post-op Pain Management:    Induction: Intravenous  PONV Risk Score and Plan: 3 and Ondansetron, Treatment may vary due to age or medical condition, Dexamethasone and Midazolam  Airway Management Planned: Oral ETT  Additional Equipment:   Intra-op Plan:   Post-operative Plan: Extubation in OR  Informed Consent: I have reviewed the patients History and Physical, chart, labs and discussed the procedure including the risks, benefits and alternatives for the proposed anesthesia with the patient or authorized representative who has indicated his/her understanding and acceptance.       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 Elmarie Shiley for CKD IV. Prior to pt's recent surgery on 04/20/18 I called [facility] and Dr. Posey Pronto confirmed he was aware of pt's  creatinine (4.24 on 2/21, now 3.94 on 8/12) and he said pt was cleared surgery from nephrology standpoint. )      Anesthesia Quick Evaluation

## 2018-10-07 ENCOUNTER — Other Ambulatory Visit (HOSPITAL_COMMUNITY)
Admission: RE | Admit: 2018-10-07 | Discharge: 2018-10-07 | Disposition: A | Discharge status: Home / Self Care | Payer: Medicaid Other | Source: Ambulatory Visit | Attending: Plastic Surgery | Admitting: Plastic Surgery

## 2018-10-07 DIAGNOSIS — Z20828 Contact with and (suspected) exposure to other viral communicable diseases: Secondary | ICD-10-CM | POA: Diagnosis not present

## 2018-10-07 DIAGNOSIS — Z01812 Encounter for preprocedural laboratory examination: Secondary | ICD-10-CM | POA: Insufficient documentation

## 2018-10-07 LAB — SARS CORONAVIRUS 2 (TAT 6-24 HRS): SARS Coronavirus 2: NEGATIVE

## 2018-10-09 ENCOUNTER — Encounter: Payer: Self-pay | Admitting: Family Medicine

## 2018-10-10 MED ORDER — DEXTROSE 5 % IV SOLN
3.0000 g | INTRAVENOUS | Status: AC
  Administered 2018-10-11: 3 g via INTRAVENOUS
  Filled 2018-10-10: qty 3
  Filled 2018-10-10: qty 3000

## 2018-10-11 ENCOUNTER — Ambulatory Visit (HOSPITAL_COMMUNITY): Payer: Medicaid Other | Admitting: Anesthesiology

## 2018-10-11 ENCOUNTER — Ambulatory Visit (HOSPITAL_COMMUNITY)
Admission: RE | Admit: 2018-10-11 | Discharge: 2018-10-11 | Disposition: A | Discharge status: Home / Self Care | Payer: Medicaid Other | Attending: Plastic Surgery | Admitting: Plastic Surgery

## 2018-10-11 ENCOUNTER — Other Ambulatory Visit: Payer: Self-pay

## 2018-10-11 ENCOUNTER — Encounter (HOSPITAL_COMMUNITY): Payer: Self-pay

## 2018-10-11 ENCOUNTER — Encounter (HOSPITAL_COMMUNITY)
Admission: RE | Disposition: A | Discharge status: Home / Self Care | Payer: Self-pay | Source: Home / Self Care | Attending: Plastic Surgery

## 2018-10-11 ENCOUNTER — Ambulatory Visit (HOSPITAL_COMMUNITY): Payer: Medicaid Other | Admitting: Physician Assistant

## 2018-10-11 DIAGNOSIS — Z7982 Long term (current) use of aspirin: Secondary | ICD-10-CM | POA: Insufficient documentation

## 2018-10-11 DIAGNOSIS — Z86711 Personal history of pulmonary embolism: Secondary | ICD-10-CM | POA: Diagnosis not present

## 2018-10-11 DIAGNOSIS — Z79899 Other long term (current) drug therapy: Secondary | ICD-10-CM | POA: Diagnosis not present

## 2018-10-11 DIAGNOSIS — E881 Lipodystrophy, not elsewhere classified: Secondary | ICD-10-CM | POA: Diagnosis not present

## 2018-10-11 DIAGNOSIS — I2699 Other pulmonary embolism without acute cor pulmonale: Secondary | ICD-10-CM | POA: Diagnosis not present

## 2018-10-11 DIAGNOSIS — G4733 Obstructive sleep apnea (adult) (pediatric): Secondary | ICD-10-CM | POA: Diagnosis not present

## 2018-10-11 DIAGNOSIS — N189 Chronic kidney disease, unspecified: Secondary | ICD-10-CM | POA: Insufficient documentation

## 2018-10-11 DIAGNOSIS — N184 Chronic kidney disease, stage 4 (severe): Secondary | ICD-10-CM | POA: Diagnosis not present

## 2018-10-11 DIAGNOSIS — I129 Hypertensive chronic kidney disease with stage 1 through stage 4 chronic kidney disease, or unspecified chronic kidney disease: Secondary | ICD-10-CM | POA: Diagnosis not present

## 2018-10-11 HISTORY — PX: ABDOMINOPLASTY/PANNICULECTOMY WITH LIPOSUCTION: SHX5577

## 2018-10-11 SURGERY — PANNICULECTOMY, ABDOMINAL, WITH LIPOSUCTION
Anesthesia: General | Site: Thigh | Laterality: Left

## 2018-10-11 MED ORDER — SUGAMMADEX SODIUM 500 MG/5ML IV SOLN
INTRAVENOUS | Status: DC | PRN
  Administered 2018-10-11: 300 mg via INTRAVENOUS

## 2018-10-11 MED ORDER — MIDAZOLAM HCL 5 MG/5ML IJ SOLN
INTRAMUSCULAR | Status: DC | PRN
  Administered 2018-10-11: 2 mg via INTRAVENOUS

## 2018-10-11 MED ORDER — CARVEDILOL 12.5 MG PO TABS
ORAL_TABLET | ORAL | Status: AC
  Filled 2018-10-11: qty 2

## 2018-10-11 MED ORDER — ACETAMINOPHEN 325 MG PO TABS: 650.0000 mg | ORAL_TABLET | ORAL | Status: DC | PRN

## 2018-10-11 MED ORDER — PROPOFOL 10 MG/ML IV BOLUS
INTRAVENOUS | Status: DC | PRN
  Administered 2018-10-11: 20 mg via INTRAVENOUS
  Administered 2018-10-11: 30 mg via INTRAVENOUS
  Administered 2018-10-11: 200 mg via INTRAVENOUS
  Administered 2018-10-11: 50 mg via INTRAVENOUS

## 2018-10-11 MED ORDER — SODIUM BICARBONATE 4 % IV SOLN
INTRAVENOUS | Status: DC | PRN
  Administered 2018-10-11: 1000 mL via INTRAMUSCULAR

## 2018-10-11 MED ORDER — OXYCODONE HCL 5 MG PO TABS: 5.0000 mg | ORAL_TABLET | ORAL | Status: DC | PRN

## 2018-10-11 MED ORDER — ACETAMINOPHEN 650 MG RE SUPP: 650.0000 mg | RECTAL | Status: DC | PRN

## 2018-10-11 MED ORDER — SUGAMMADEX SODIUM 500 MG/5ML IV SOLN
INTRAVENOUS | Status: AC
  Filled 2018-10-11: qty 5

## 2018-10-11 MED ORDER — SUCCINYLCHOLINE CHLORIDE 20 MG/ML IJ SOLN
INTRAMUSCULAR | Status: DC | PRN
  Administered 2018-10-11: 140 mg via INTRAVENOUS

## 2018-10-11 MED ORDER — PHENYLEPHRINE 40 MCG/ML (10ML) SYRINGE FOR IV PUSH (FOR BLOOD PRESSURE SUPPORT)
PREFILLED_SYRINGE | INTRAVENOUS | Status: AC
  Filled 2018-10-11: qty 10

## 2018-10-11 MED ORDER — LIDOCAINE 2% (20 MG/ML) 5 ML SYRINGE
INTRAMUSCULAR | Status: AC
  Filled 2018-10-11: qty 5

## 2018-10-11 MED ORDER — ALBUMIN HUMAN 5 % IV SOLN
INTRAVENOUS | Status: DC | PRN
  Administered 2018-10-11 (×2): via INTRAVENOUS

## 2018-10-11 MED ORDER — LACTATED RINGERS IV SOLN
INTRAVENOUS | Status: DC | PRN
  Administered 2018-10-11: 08:00:00 via INTRAVENOUS

## 2018-10-11 MED ORDER — 0.9 % SODIUM CHLORIDE (POUR BTL) OPTIME
TOPICAL | Status: DC | PRN
  Administered 2018-10-11: 1000 mL

## 2018-10-11 MED ORDER — ONDANSETRON HCL 4 MG/2ML IJ SOLN: 4.0000 mg | Freq: Once | INTRAMUSCULAR | Status: DC | PRN

## 2018-10-11 MED ORDER — GLYCOPYRROLATE 0.2 MG/ML IJ SOLN
INTRAMUSCULAR | Status: DC | PRN
  Administered 2018-10-11: 0.2 mg via INTRAVENOUS

## 2018-10-11 MED ORDER — FENTANYL CITRATE (PF) 100 MCG/2ML IJ SOLN: 25.0000 ug | INTRAMUSCULAR | Status: DC | PRN

## 2018-10-11 MED ORDER — ROCURONIUM BROMIDE 50 MG/5ML IV SOSY
PREFILLED_SYRINGE | INTRAVENOUS | Status: DC | PRN
  Administered 2018-10-11: 20 mg via INTRAVENOUS
  Administered 2018-10-11: 30 mg via INTRAVENOUS
  Administered 2018-10-11: 60 mg via INTRAVENOUS
  Administered 2018-10-11: 20 mg via INTRAVENOUS

## 2018-10-11 MED ORDER — EPHEDRINE SULFATE 50 MG/ML IJ SOLN
INTRAMUSCULAR | Status: DC | PRN
  Administered 2018-10-11 (×2): 25 mg via INTRAVENOUS

## 2018-10-11 MED ORDER — PHENYLEPHRINE HCL (PRESSORS) 10 MG/ML IV SOLN
INTRAVENOUS | Status: AC
  Filled 2018-10-11: qty 1

## 2018-10-11 MED ORDER — SODIUM BICARBONATE 4 % IV SOLN
Freq: Once | INTRAVENOUS | Status: DC
  Filled 2018-10-11 (×2): qty 50

## 2018-10-11 MED ORDER — STERILE WATER FOR IRRIGATION IR SOLN
Status: DC | PRN
  Administered 2018-10-11: 1000 mL

## 2018-10-11 MED ORDER — SODIUM CHLORIDE 0.9% FLUSH: 3.0000 mL | INTRAVENOUS | Status: DC | PRN

## 2018-10-11 MED ORDER — ROCURONIUM BROMIDE 10 MG/ML (PF) SYRINGE
PREFILLED_SYRINGE | INTRAVENOUS | Status: AC
  Filled 2018-10-11: qty 10

## 2018-10-11 MED ORDER — SODIUM CHLORIDE 0.9% FLUSH: 3.0000 mL | Freq: Two times a day (BID) | INTRAVENOUS | Status: DC

## 2018-10-11 MED ORDER — SODIUM CHLORIDE 0.9 % IV SOLN: 250.0000 mL | INTRAVENOUS | Status: DC | PRN

## 2018-10-11 MED ORDER — PROPOFOL 10 MG/ML IV BOLUS
INTRAVENOUS | Status: AC
  Filled 2018-10-11: qty 40

## 2018-10-11 MED ORDER — DEXAMETHASONE SODIUM PHOSPHATE 10 MG/ML IJ SOLN
INTRAMUSCULAR | Status: DC | PRN
  Administered 2018-10-11: 5 mg via INTRAVENOUS

## 2018-10-11 MED ORDER — DEXAMETHASONE SODIUM PHOSPHATE 10 MG/ML IJ SOLN
INTRAMUSCULAR | Status: AC
  Filled 2018-10-11: qty 1

## 2018-10-11 MED ORDER — FENTANYL CITRATE (PF) 100 MCG/2ML IJ SOLN
INTRAMUSCULAR | Status: DC | PRN
  Administered 2018-10-11 (×2): 50 ug via INTRAVENOUS
  Administered 2018-10-11: 100 ug via INTRAVENOUS

## 2018-10-11 MED ORDER — ONDANSETRON HCL 4 MG/2ML IJ SOLN
INTRAMUSCULAR | Status: DC | PRN
  Administered 2018-10-11: 4 mg via INTRAVENOUS

## 2018-10-11 MED ORDER — CHLORHEXIDINE GLUCONATE CLOTH 2 % EX PADS: 6.0000 | MEDICATED_PAD | Freq: Once | CUTANEOUS | Status: DC

## 2018-10-11 MED ORDER — BUPIVACAINE-EPINEPHRINE 0.25% -1:200000 IJ SOLN
INTRAMUSCULAR | Status: DC | PRN
  Administered 2018-10-11: 30 mL

## 2018-10-11 MED ORDER — CARVEDILOL 12.5 MG PO TABS
25.0000 mg | ORAL_TABLET | Freq: Once | ORAL | Status: AC
  Administered 2018-10-11: 25 mg via ORAL

## 2018-10-11 MED ORDER — MIDAZOLAM HCL 2 MG/2ML IJ SOLN
INTRAMUSCULAR | Status: AC
  Filled 2018-10-11: qty 2

## 2018-10-11 MED ORDER — EPHEDRINE 5 MG/ML INJ
INTRAVENOUS | Status: AC
  Filled 2018-10-11: qty 10

## 2018-10-11 MED ORDER — PHENYLEPHRINE HCL (PRESSORS) 10 MG/ML IV SOLN
INTRAVENOUS | Status: DC | PRN
  Administered 2018-10-11 (×2): 80 ug via INTRAVENOUS
  Administered 2018-10-11 (×2): 120 ug via INTRAVENOUS
  Administered 2018-10-11 (×2): 80 ug via INTRAVENOUS

## 2018-10-11 MED ORDER — SODIUM CHLORIDE 0.9 % IV SOLN
INTRAVENOUS | Status: DC | PRN
  Administered 2018-10-11: 100 ug/min via INTRAVENOUS

## 2018-10-11 MED ORDER — LIDOCAINE 2% (20 MG/ML) 5 ML SYRINGE
INTRAMUSCULAR | Status: DC | PRN
  Administered 2018-10-11: 60 mg via INTRAVENOUS

## 2018-10-11 MED ORDER — ONDANSETRON HCL 4 MG/2ML IJ SOLN
INTRAMUSCULAR | Status: AC
  Filled 2018-10-11: qty 2

## 2018-10-11 MED ORDER — SUCCINYLCHOLINE CHLORIDE 200 MG/10ML IV SOSY
PREFILLED_SYRINGE | INTRAVENOUS | Status: AC
  Filled 2018-10-11: qty 10

## 2018-10-11 MED ORDER — BUPIVACAINE-EPINEPHRINE (PF) 0.25% -1:200000 IJ SOLN
INTRAMUSCULAR | Status: AC
  Filled 2018-10-11: qty 30

## 2018-10-11 MED ORDER — FENTANYL CITRATE (PF) 250 MCG/5ML IJ SOLN
INTRAMUSCULAR | Status: AC
  Filled 2018-10-11: qty 5

## 2018-10-11 SURGICAL SUPPLY — 64 items
ADH SKN CLS APL DERMABOND .7 (GAUZE/BANDAGES/DRESSINGS) ×1
APL PRP STRL LF DISP 70% ISPRP (MISCELLANEOUS) ×1
APL SKNCLS STERI-STRIP NONHPOA (GAUZE/BANDAGES/DRESSINGS) ×2
APPLIER CLIP 11 MED OPEN (CLIP) ×6
APR CLP MED 11 20 MLT OPN (CLIP) ×2
BAG DECANTER FOR FLEXI CONT (MISCELLANEOUS) ×2 IMPLANT
BENZOIN TINCTURE PRP APPL 2/3 (GAUZE/BANDAGES/DRESSINGS) ×6 IMPLANT
BINDER ABDOMINAL 12 ML 46-62 (SOFTGOODS) IMPLANT
BIOPATCH RED 1 DISK 7.0 (GAUZE/BANDAGES/DRESSINGS) ×1 IMPLANT
BIOPATCH RED 1IN DISK 7.0MM (GAUZE/BANDAGES/DRESSINGS) ×1
BLADE CLIPPER SURG (BLADE) ×3 IMPLANT
BLADE SURG 10 STRL SS (BLADE) ×12 IMPLANT
CHLORAPREP W/TINT 26 (MISCELLANEOUS) ×3 IMPLANT
CLIP APPLIE 11 MED OPEN (CLIP) IMPLANT
CLOSURE WOUND 1/2 X4 (GAUZE/BANDAGES/DRESSINGS)
COVER SURGICAL LIGHT HANDLE (MISCELLANEOUS) ×3 IMPLANT
COVER WAND RF STERILE (DRAPES) ×1 IMPLANT
DERMABOND ADVANCED (GAUZE/BANDAGES/DRESSINGS) ×2
DERMABOND ADVANCED .7 DNX12 (GAUZE/BANDAGES/DRESSINGS) ×2 IMPLANT
DRAIN CHANNEL 19F RND (DRAIN) ×4 IMPLANT
DRAPE HALF SHEET 40X57 (DRAPES) ×5 IMPLANT
DRAPE INCISE IOBAN 66X45 STRL (DRAPES) ×2 IMPLANT
DRAPE ORTHO SPLIT 77X108 STRL (DRAPES) ×3
DRAPE SURG 17X11 SM STRL (DRAPES) ×6 IMPLANT
DRAPE SURG ORHT 6 SPLT 77X108 (DRAPES) ×1 IMPLANT
DRESSING HYDROCOLLOID 4X4 (GAUZE/BANDAGES/DRESSINGS) ×2 IMPLANT
DRESSING PREVENA PLUS CUSTOM (GAUZE/BANDAGES/DRESSINGS) IMPLANT
DRSG PAD ABDOMINAL 8X10 ST (GAUZE/BANDAGES/DRESSINGS) ×12 IMPLANT
DRSG PREVENA PLUS CUSTOM (GAUZE/BANDAGES/DRESSINGS)
ELECT BLADE 6.5 EXT (BLADE) IMPLANT
ELECT CAUTERY BLADE 6.4 (BLADE) ×3 IMPLANT
ELECT REM PT RETURN 9FT ADLT (ELECTROSURGICAL) ×3
ELECTRODE REM PT RTRN 9FT ADLT (ELECTROSURGICAL) ×1 IMPLANT
EVACUATOR SILICONE 100CC (DRAIN) ×4 IMPLANT
GAUZE SPONGE 4X4 12PLY STRL (GAUZE/BANDAGES/DRESSINGS) ×6 IMPLANT
GAUZE XEROFORM 5X9 LF (GAUZE/BANDAGES/DRESSINGS) ×6 IMPLANT
GLOVE BIO SURGEON STRL SZ 6.5 (GLOVE) ×4 IMPLANT
GLOVE BIO SURGEONS STRL SZ 6.5 (GLOVE) ×2
GOWN STRL REUS W/ TWL LRG LVL3 (GOWN DISPOSABLE) ×3 IMPLANT
GOWN STRL REUS W/TWL LRG LVL3 (GOWN DISPOSABLE) ×9
KIT BASIN OR (CUSTOM PROCEDURE TRAY) ×3 IMPLANT
KIT DRSG PREVENA PLUS 7DAY 125 (MISCELLANEOUS) ×2 IMPLANT
KIT PREVENA INCISION MGT20CM45 (CANNISTER) ×2 IMPLANT
KIT TURNOVER KIT B (KITS) ×3 IMPLANT
MARKER SKIN DUAL TIP RULER LAB (MISCELLANEOUS) ×3 IMPLANT
NS IRRIG 1000ML POUR BTL (IV SOLUTION) ×6 IMPLANT
PACK GENERAL/GYN (CUSTOM PROCEDURE TRAY) ×3 IMPLANT
PAD ARMBOARD 7.5X6 YLW CONV (MISCELLANEOUS) ×6 IMPLANT
SPECIMEN JAR LARGE (MISCELLANEOUS) ×6 IMPLANT
SPONGE LAP 18X18 RF (DISPOSABLE) ×12 IMPLANT
STRIP CLOSURE SKIN 1/2X4 (GAUZE/BANDAGES/DRESSINGS) IMPLANT
SUT ETHILON 2 0 FS 18 (SUTURE) IMPLANT
SUT MNCRL AB 3-0 PS2 18 (SUTURE) ×12 IMPLANT
SUT MNCRL AB 4-0 PS2 18 (SUTURE) ×10 IMPLANT
SUT MON AB 5-0 P3 18 (SUTURE) ×12 IMPLANT
SUT SILK 3 0 SH 30 (SUTURE) ×4 IMPLANT
SUT VIC AB 3-0 SH 18 (SUTURE) IMPLANT
SYR 50ML SLIP (SYRINGE) ×2 IMPLANT
SYRINGE TOOMEY DISP (SYRINGE) ×2 IMPLANT
TOWEL GREEN STERILE (TOWEL DISPOSABLE) ×6 IMPLANT
TOWEL GREEN STERILE FF (TOWEL DISPOSABLE) ×3 IMPLANT
TRAY FOLEY W/BAG SLVR 16FR (SET/KITS/TRAYS/PACK)
TRAY FOLEY W/BAG SLVR 16FR ST (SET/KITS/TRAYS/PACK) ×1 IMPLANT
WATER STERILE IRR 1000ML POUR (IV SOLUTION) ×4 IMPLANT

## 2018-10-11 NOTE — Discharge Instructions (Signed)
[facility] Plastic Surgery Specialist  What is the benefit of having a drain?  During surgery your tissue layers are separated.  This raw surface stimulates your body to fill the space with serous fluid.  This is normal but you don't want that fluid to collect and prevent healing.  A fluid collection can also become infected.  The Jackson-Pratt (JP) drain is used to eliminate this collection of fluid and allow the tissue to heal together.    Jackson-Pratt (JP) bulb    How to care for your drainage and suction unit at home Your drainage catheter will be connected to a collection device. The vacuum caused when the device is compressed allows drainage to collect in the device.     Wash your hands with soap and water before and after touching the system.  Empty the JP drain every 12 hours once you get home from your procedure.  Record the fluid amount on the record sheet included.  Start with stripping the drain tube to push the clots or excess fluid to the bulb.  Do this by pinching the tube with one hand near your skin.  Then with the other hand squeeze the tubing and work it toward the bulb.  This should be done several times a day.  This may collapse the tube which will correct on its own.    Use a safety pin to attach your collection device to your clothing so there is no tension on the insertion site.    If you have drainage at the skin insertion site, you can apply a gauze dressing and secure it with tape.  If the drain falls out, apply a gauze dressing over the drain insertion site and secure with tape.   To empty the collection device:    Release the stopper on the top of the collection unit (bulb).   Pour contents into a measuring container such as a plastic medicine cup.   Record the day and amount of drainage on the attached sheet.  This should be done at least twice a day.    To compress the Jackson-Pratt Bulb:   Release the stopper at the top of the bulb.  Squeeze the bulb  tightly in your fist, squeezing air out of the bulb.   Replace the stopper while the bulb is compressed.   Be careful not to spill the contents when squeezing the bulb.  The drainage will start bright red and turn to pink and then yellow with time.  IMPORTANT: If the bulb is not squeezed before adding the stopper it will not draw out the fluid.  Care for the JP drain site and your skin daily:   You may shower three days after surgery.  Secure the drain to a ribbon or cloth around your waist while showering so it does not pull out while showering.  Be sure your hands are cleaned with soap and water.  Use a clean wet cotton swab to clean the skin around the drain site.   Use another cotton swab to place Vaseline or antibiotic ointment on the skin around the drain.     Contact your physician if any of the following occur:   The fluid in the bulb becomes cloudy.  Your temperature is greater than 101.4.   The incision opens.  If you have drainage at the skin insertion site, you can apply a gauze dressing and secure it with tape.  If the drain falls out, apply a gauze dressing over the  drain insertion site and secure with tape.   You will usually have more drainage when you are active than while you rest or are asleep. If the drainage increases significantly or is bloody call the physician                             Bring this record with you to each office visit Date  Drainage Volume  Date   Drainage volume

## 2018-10-11 NOTE — Interval H&P Note (Signed)
History and Physical Interval Note:  10/11/2018 7:34 AM  Johnny [name]  has presented today for surgery, with the diagnosis of Lipodystrophy.  The various methods of treatment have been discussed with the patient and family. After consideration of risks, benefits and other options for treatment, the patient has consented to  Procedure(s): Excision of left thigh lipodystrophy (Left) as a surgical intervention.  The patient's history has been reviewed, patient examined, no change in status, stable for surgery.  I have reviewed the patient's chart and labs.  Questions were answered to the patient's satisfaction.     [name]

## 2018-10-11 NOTE — Op Note (Addendum)
DATE OF OPERATION: 10/11/2018  LOCATION: [facility]  PREOPERATIVE DIAGNOSIS: Lipodystrophy of left thigh  POSTOPERATIVE DIAGNOSIS: Same  PROCEDURE: Excision of left medial thigh lipodystrophy  SURGEON:  Sanger , DO  ASSISTANT: Roetta Sessions, PA and [place], RNFA  EBL: 150 cc  CONDITION: Stable  COMPLICATIONS: None  INDICATION: The patient, Johnny [name], is a 58 y.o. male born on [birth year], is here for treatment of left thigh lipodystrophy.   PROCEDURE DETAILS:  The patient was seen prior to surgery and marked.  The IV antibiotics were given. The patient was taken to the operating room and given a general anesthetic. A standard time out was performed and all information was confirmed by those in the room. SCDs were placed.  The left thigh was prepped and draped.  The leg was marked.  Local with epinepherine was injected for intraoperative hemostasis and post operative pain control.  The #15 blade was used to make the anterior incision in the skin.  The bovie was used to start dissection to the fat and deep layers.  Hemostasis was achieved as we progressed.  The large vessels were clipped and then cut.  The dissection was carried to the posterior skin side.  The amount to be removed was checked and measured for closure.  The posterior aspect was excised with a #15 blade.  The weight of the tissue was 7 pounds.  Hemostasis was achieved and a drain was placed.  The drain was secured with the 3-0 Silk.  The skin edges were brought together with a staple for working in the tissue.  the staples were removed after the sutures were applied.  The 3-0 Monocryl was placed at the deep fascial layer.  The subcuticular and dermal layer were closed with the 4-0 and 5-0 Monocryl.  The derma bond was placed at the groin.  The prevena was applied to the remaining incision.  The patient was allowed to wake up and taken to recovery room in stable condition at the end  of the case. The family was notified at the end of the case.   The advanced practice practitioner (APP) assisted throughout the case.  The APP was essential in retraction and counter traction when needed to make the case progress smoothly.  This retraction and assistance made it possible to see the tissue plans for the procedure.  The assistance was needed for blood control, tissue re-approximation and assisted with closure of the incision site.

## 2018-10-11 NOTE — Anesthesia Postprocedure Evaluation (Signed)
Anesthesia Post Note  Patient: Johnny [name]  Procedure(s) Performed: Excision of left thigh lipodystrophy (Left Thigh)     Patient location during evaluation: PACU Anesthesia Type: General Level of consciousness: sedated Pain management: pain level controlled Vital Signs Assessment: post-procedure vital signs reviewed and stable Respiratory status: spontaneous breathing and respiratory function stable Cardiovascular status: stable Postop Assessment: no apparent nausea or vomiting Anesthetic complications: no    Last Vitals:  Vitals:   10/11/18 1139 10/11/18 1154  BP: 127/84 120/78  Pulse: 69 79  Resp: (!) 21 17  Temp: 36.6 C 36.6 C  SpO2: 93% 94%    Last Pain:  Vitals:   10/11/18 1154  TempSrc:   PainSc: 0-No pain                 Laikynn Pollio DANIEL

## 2018-10-11 NOTE — Anesthesia Procedure Notes (Addendum)
Procedure Name: Intubation Date/Time: 10/11/2018 8:41 AM Performed by: Scheryl Darter, CRNA Pre-anesthesia Checklist: Patient identified, Emergency Drugs available, Suction available and Patient being monitored Patient Re-evaluated:Patient Re-evaluated prior to induction Oxygen Delivery Method: Circle System Utilized Preoxygenation: Pre-oxygenation with 100% oxygen Induction Type: IV induction and Rapid sequence Ventilation: Mask ventilation without difficulty Laryngoscope Size: Miller and 4 Grade View: Grade I Tube type: Oral Tube size: 7.5 mm Number of attempts: 1 Airway Equipment and Method: Stylet and Oral airway Placement Confirmation: ETT inserted through vocal cords under direct vision,  positive ETCO2 and breath sounds checked- equal and bilateral Secured at: 22 cm Tube secured with: Tape Dental Injury: Teeth and Oropharynx as per pre-operative assessment

## 2018-10-11 NOTE — Transfer of Care (Signed)
Immediate Anesthesia Transfer of Care Note  Patient: Johnny [name]  Procedure(s) Performed: Excision of left thigh lipodystrophy (Left Thigh)  Patient Location: PACU  Anesthesia Type:General  Level of Consciousness: alert , oriented and patient cooperative  Airway & Oxygen Therapy: Patient Spontanous Breathing and Patient connected to nasal cannula oxygen  Post-op Assessment: Report given to RN and Post -op Vital signs reviewed and stable  Post vital signs: Reviewed and stable  Last Vitals:  Vitals Value Taken Time  BP 127/84 10/11/18 1139  Temp    Pulse 68 10/11/18 1142  Resp 27 10/11/18 1142  SpO2 93 % 10/11/18 1142  Vitals shown include unvalidated device data.  Last Pain:  Vitals:   10/11/18 0717  TempSrc: Oral  PainSc: 0-No pain      Patients Stated Pain Goal: 0 (88/11/03 1594)  Complications: No apparent anesthesia complications

## 2018-10-12 ENCOUNTER — Encounter (HOSPITAL_COMMUNITY): Payer: Self-pay | Admitting: Plastic Surgery

## 2018-10-16 ENCOUNTER — Other Ambulatory Visit: Payer: Self-pay

## 2018-10-16 ENCOUNTER — Encounter: Payer: Self-pay | Admitting: Nurse Practitioner

## 2018-10-16 ENCOUNTER — Ambulatory Visit (INDEPENDENT_AMBULATORY_CARE_PROVIDER_SITE_OTHER): Payer: Medicaid Other | Admitting: Nurse Practitioner

## 2018-10-16 VITALS — BP 131/84 | HR 75 | Temp 98.0°F

## 2018-10-16 DIAGNOSIS — E881 Lipodystrophy, not elsewhere classified: Secondary | ICD-10-CM

## 2018-10-16 NOTE — Progress Notes (Signed)
Patient ID: Johnny [name], male    DOB: 1960-08-22, 58 y.o.   MRN: [id number]   C.C.: bleeding at drain site  Johnny [name] is a 58 yo male POD #5 s/p panniculectomy of left inner thigh. He presents today with concerns of bleeding at the JP drain site. He states he spoke with Dr. Marla Roe over the weekend and was recommended to put gauze around the drain. He states the bleeding has continued to drain through several gauze pads and is leaking down his leg. He last emptied the drain bulb around 0300 this morning. He denies any pain at the site and has been comfortable overall. He has been walking without difficulty. Denies any fevers. Denies any trouble with the wound vac.    Review of Systems  Constitutional: Positive for activity change. Negative for chills and fever.  HENT: Negative.   Respiratory: Negative.   Cardiovascular: Negative.   Gastrointestinal: Negative.   Genitourinary: Negative.   Musculoskeletal:       Slightly stiff in left leg  Skin:       Left thigh incision    Past Medical History:  Diagnosis Date  . Acquired lymphedema 12/13/2017  . Chronic kidney disease    Chronic Kidney disease-stage unknown  . Diverticulosis of large intestine without hemorrhage 01/31/2012  . Essential HTN 04/21/2006  . Excess skin of thigh    left thigh adipose excess  . Hypertension   . OSA 05/21/2013   does not wear CPAP any longer  . 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 Wears dentures     Past Surgical History:  Procedure Laterality Date  . ABDOMINOPLASTY/PANNICULECTOMY WITH LIPOSUCTION Left 10/11/2018   Procedure: Excision of left thigh lipodystrophy;  Surgeon: Wallace Going, DO;  Location: [facility];  Service: Plastics;  Laterality: Left;  . COLONOSCOPY  01/31/2012   Procedure: COLONOSCOPY;  Surgeon: Inda Castle, MD;  Location: [facility];  Service: Endoscopy;  Laterality: N/A;  bmi is 38  . MULTIPLE TOOTH EXTRACTIONS    . NO PAST SURGERIES    . PANNICULECTOMY Bilateral  04/20/2018   Procedure: Excision of right inner thighs;  Surgeon: Wallace Going, DO;  Location: [facility];  Service: Plastics;  Laterality: Bilateral;  Excision of right inner thighs      Current Outpatient Medications:  .  aspirin EC 81 MG tablet, Take 81 mg by mouth daily., Disp: , Rfl:  .  carvedilol (COREG) 25 MG tablet, Take 1 tablet (25 mg total) by mouth 2 (two) times daily with a meal., Disp: 30 tablet, Rfl: 0 .  furosemide (LASIX) 40 MG tablet, Take 1 tablet (40 mg total) by mouth daily., Disp: 10 tablet, Rfl: 0 .  hydrochlorothiazide (HYDRODIURIL) 25 MG tablet, TAKE ONE TABLET BY MOUTH  DAILY (Patient not taking: Reported on 09/29/2018), Disp: 90 tablet, Rfl: 0 .  RAYALDEE 30 MCG CPCR, Take 30 mcg by mouth daily., Disp: , Rfl: 1 .  spironolactone (ALDACTONE) 100 MG tablet, Take 100 mg by mouth daily., Disp: , Rfl: 9   Objective:   Vitals:   10/16/18 1100  BP: 131/84  Pulse: 75  Temp: 98 F (36.7 C)  SpO2: 99%    Physical Exam  General: alert, calm, no acute distress HEENT: normocephalic  Neck: supple, full ROM Chest: symmetrical rise and fall Lungs: unlabored breathing Musculoskeletal: MAEx4 Extremities: left inner thigh with wound vac intact and suctioning, JP drain deactivated and full with serosanguinous output, few blood clots in drain tubing, no  drainage at JP insertion site Neuro: A&O x3, calm, cooperative, steady gait   Assessment & Plan:  Johnny [name] is a 58 yo male POD #5 s/p panniculectomy of left inner thigh. He had some drainage around his JP site. The drain was milked and few small blood clots moved through the tube. The drain was emptied for a total of 85 ml serosanguinous fluid. The leakage it most likely secondary to deactivation of the bulb. Patient was advised to empty the drain frequently and ensure it is always activated. Gauze was placed around the JP drain. The wound vac appears to be functioning property. No signs of infection. Overall, he  appears to be doing quite well. Return on 8/28 for scheduled follow up visit.       Alfredo Batty, NP

## 2018-10-19 ENCOUNTER — Encounter: Payer: Self-pay | Admitting: Surgical

## 2018-10-19 ENCOUNTER — Other Ambulatory Visit: Payer: Self-pay

## 2018-10-19 ENCOUNTER — Ambulatory Visit (INDEPENDENT_AMBULATORY_CARE_PROVIDER_SITE_OTHER): Payer: Medicaid Other | Admitting: Surgical

## 2018-10-19 VITALS — BP 143/85 | HR 71 | Temp 98.4°F | Ht 69.0 in | Wt 317.4 lb

## 2018-10-19 DIAGNOSIS — E881 Lipodystrophy, not elsewhere classified: Secondary | ICD-10-CM

## 2018-10-19 NOTE — Progress Notes (Signed)
   Subjective:     Patient ID: Johnny [name], male    DOB: 1960-03-09, 58 y.o.   MRN: [id number]  Chief Complaint  Patient presents with  . Post-op Follow-up    for excision of (L) thigh lipodystropy    HPI: The patient is a 58 y.o. male here for follow-up after excision of left thigh lipodystrophy on 10/11/18 with Dr. Marla Roe.   He was recently seen due to bleeding around his JP drain site. He called yesterday and spoke with RN in regards to the battery on his prevena vac being low in battery.   Today he is doing well. His pain is adequately controlled and he is walking without much pain. His wound vac was removed and his incisions are c/d/i. There is no sign of drainage around the JP drain insertion site today, but he reports it typically occurs during the night when the bulb becomes full and it backs up. We emptied ~ 90 cc of serosanguinous fluid from the bulb today. Suture still in tact.  There is one area near the proximal midline aspect of his incision that has a small wound, but it appears superficial and is of no concern at this time.   No fevers, chills, n/v. He did not start his antibiotic yet, but he said he will today. He wasn't sure if he was supposed to or not.  Review of Systems  Constitutional: Negative for chills, diaphoresis, fever, malaise/fatigue and weight loss.  Respiratory: Negative.   Cardiovascular: Positive for leg swelling. Negative for chest pain and palpitations.  Genitourinary: Negative.   Musculoskeletal: Negative.   Skin: Negative for itching and rash.  Neurological: Negative.      Objective:   Vital Signs BP (!) 143/85 (BP Location: Left Arm, Patient Position: Sitting, Cuff Size: Large)   Pulse 71   Temp 98.4 F (36.9 C) (Oral)   Ht 5\' 9"  (1.753 m)   Wt (!) 317 lb 6.4 oz (144 kg)   SpO2 99%   BMI 46.87 kg/m  Vital Signs and Nursing Note Reviewed  Physical Exam  Constitutional: He is oriented to person, place, and time and well-developed,  well-nourished, and in no distress. No distress.  HENT:  Head: Normocephalic and atraumatic.  Cardiovascular: Normal rate.  Pulmonary/Chest: Effort normal.  Musculoskeletal: Normal range of motion.        General: Tenderness and edema present. No deformity.  Neurological: He is alert and oriented to person, place, and time.  Skin: Skin is warm and dry. No rash noted. He is not diaphoretic. No erythema. No pallor.     Psychiatric: Mood and affect normal.      Assessment/Plan:     ICD-10-CM   1. Lipodystrophy  E88.1    Wound vac removed. JP drain in place - still draining significant amount. Will remain at this time.  Start abx - he had forgotten. Continue to eat healthy, drink plenty of water.  Follow up next Friday - 10/27/18  Carola Rhine Mirjana Tarleton, PA-C 10/19/2018, 3:04 PM

## 2018-10-20 ENCOUNTER — Encounter: Payer: Medicaid Other | Admitting: Surgical

## 2018-10-26 ENCOUNTER — Telehealth: Payer: Self-pay

## 2018-10-26 NOTE — Telephone Encounter (Signed)

## 2018-10-27 ENCOUNTER — Ambulatory Visit (INDEPENDENT_AMBULATORY_CARE_PROVIDER_SITE_OTHER): Payer: Medicaid Other | Admitting: Plastic Surgery

## 2018-10-27 ENCOUNTER — Other Ambulatory Visit: Payer: Self-pay

## 2018-10-27 ENCOUNTER — Telehealth: Payer: Self-pay

## 2018-10-27 ENCOUNTER — Encounter: Payer: Self-pay | Admitting: Plastic Surgery

## 2018-10-27 DIAGNOSIS — Z719 Counseling, unspecified: Secondary | ICD-10-CM

## 2018-10-27 DIAGNOSIS — E881 Lipodystrophy, not elsewhere classified: Secondary | ICD-10-CM

## 2018-10-27 MED ORDER — HYDROCODONE-ACETAMINOPHEN 5-325 MG PO TABS: 1.0000 | ORAL_TABLET | Freq: Four times a day (QID) | ORAL | 0 refills | Status: AC | PRN

## 2018-10-27 NOTE — Telephone Encounter (Signed)
Patient called asking that prescription be called in for pain if possible.

## 2018-10-27 NOTE — Progress Notes (Signed)
   Subjective:    Patient ID: Johnny [name], male    DOB: 22-Oct-1960, 58 y.o.   MRN: [id number]  The patient is a 57 year old black male here for postsurgical follow-up.  He underwent excision of lipodystrophy of the left thigh.  He is having some drainage and swelling.  Nothing looks infected.  There is some fluid collection.  I was able to milk some of that out through the drain.  He is getting ready for his daughter's wedding tomorrow.     Review of Systems  Constitutional: Positive for activity change.  Eyes: Negative.   Respiratory: Negative.   Cardiovascular: Negative.   Gastrointestinal: Negative.   Musculoskeletal: Negative.   Skin: Positive for color change.       Objective:   Physical Exam Vitals signs and nursing note reviewed.  Constitutional:      Appearance: Normal appearance.  HENT:     Head: Normocephalic and atraumatic.  Cardiovascular:     Rate and Rhythm: Normal rate.  Pulmonary:     Effort: Pulmonary effort is normal.  Neurological:     General: No focal deficit present.     Mental Status: He is alert. Mental status is at baseline.  Psychiatric:        Mood and Affect: Mood normal.        Assessment & Plan:     ICD-10-CM   1. Lipodystrophy  E88.1     We put the patient in silver socks for compression.  And compression for his thigh.  He can shower today. Recommend light massage to the dependent area and elevation.  Follow-up in 1 week.  A refill was sent in for pain medicine.

## 2018-10-27 NOTE — Telephone Encounter (Signed)
Prescription for pain medication was sent to the pharmacy.//AB/CMA

## 2018-11-01 DIAGNOSIS — N184 Chronic kidney disease, stage 4 (severe): Secondary | ICD-10-CM | POA: Diagnosis not present

## 2018-11-02 ENCOUNTER — Telehealth: Payer: Self-pay

## 2018-11-02 NOTE — Telephone Encounter (Signed)

## 2018-11-03 ENCOUNTER — Other Ambulatory Visit: Payer: Self-pay

## 2018-11-03 ENCOUNTER — Ambulatory Visit (INDEPENDENT_AMBULATORY_CARE_PROVIDER_SITE_OTHER): Payer: Medicaid Other | Admitting: Surgical

## 2018-11-03 ENCOUNTER — Encounter: Payer: Self-pay | Admitting: Surgical

## 2018-11-03 VITALS — BP 106/70 | HR 86 | Temp 97.5°F

## 2018-11-03 DIAGNOSIS — E881 Lipodystrophy, not elsewhere classified: Secondary | ICD-10-CM

## 2018-11-03 NOTE — Progress Notes (Signed)
   Subjective:     Patient ID: Johnny [name], male    DOB: 22-Feb-1961, 58 y.o.   MRN: [id number]  Chief Complaint  Patient presents with  . Follow-up    1 week for Lipodystrophy    HPI: The patient is a 58 y.o. male here for follow-up after excision of left thigh lipodystrophy on 10/11/18 with Dr. Marla Roe. Last seen in this office on 7 days ago.  Over the past few days or so he has noticed increased drainage from the inferior aspect of his incision.  On exam there is a small wound that is draining serosanguineous fluid.  No sign of purulence, infection, hematoma.  His pain is significantly improved.  JP drain still in place along the medial aspect of his inner thigh.  Fluid in bulb is serosanguineous in nature.  He has had minimal output, this may be due to the drainage finding another route of exit through his wound.  No fevers, chills, nausea, vomiting.  He had went to his daughter's wedding since his last visit and he reports that it was a great time and he did well.  Pain medication has been helping with pain control.   Review of Systems  Constitutional: Negative for chills, diaphoresis, fever and malaise/fatigue.       Positive activity change  Respiratory: Negative for cough and shortness of breath.   Cardiovascular: Positive for leg swelling. Negative for chest pain, palpitations, orthopnea and claudication.  Genitourinary: Negative.   Musculoskeletal: Positive for myalgias.  Skin: Negative for itching and rash.       Positive wound  Neurological: Positive for sensory change. Negative for dizziness, speech change, focal weakness, weakness and headaches.     Objective:   Vital Signs BP 106/70 (BP Location: Left Arm, Patient Position: Sitting, Cuff Size: Large)   Pulse 86   Temp (!) 97.5 F (36.4 C) (Temporal)   SpO2 100%  Vital Signs and Nursing Note Reviewed  Physical Exam  Constitutional: He is oriented to person, place, and time and well-developed, well-nourished,  and in no distress.  HENT:  Head: Normocephalic and atraumatic.  Cardiovascular: Normal rate.  Pulmonary/Chest: Effort normal. No respiratory distress.  Abdominal: Soft.  Musculoskeletal:        General: Tenderness, deformity and edema present.       Legs:  Neurological: He is alert and oriented to person, place, and time. Gait normal.  Skin: Skin is warm. No rash noted. He is not diaphoretic. No erythema.  Psychiatric: Mood and affect normal.      Assessment/Plan:     ICD-10-CM   1. Lipodystrophy  E88.1     JP drain removed today.  Patient is having continuous drainage from the wound that is near his popliteal fossa.  Drainage is serosanguineous in nature.  Pain is adequately controlled with pain medication.  Patient understands only take as needed and to try to safer at night when pain is worst  Continue ambulating as tolerated.  Would like to see patient back in 3 days for follow-up on the wound.  Continue wearing silver sock on left foot to decrease swelling, elevate the leg when at home and when at rest.  Continue wrapping to the left thigh with Kerlix and Ace wraps.   Johnny Rhine Kiaya Hal[place]urton, PA-C 11/03/2018, 10:52 AM

## 2018-11-06 ENCOUNTER — Encounter: Payer: Self-pay | Admitting: Nurse Practitioner

## 2018-11-06 ENCOUNTER — Ambulatory Visit (INDEPENDENT_AMBULATORY_CARE_PROVIDER_SITE_OTHER): Payer: Medicaid Other | Admitting: Nurse Practitioner

## 2018-11-06 ENCOUNTER — Other Ambulatory Visit: Payer: Self-pay

## 2018-11-06 VITALS — BP 122/86 | HR 83 | Temp 97.3°F | Ht 69.0 in

## 2018-11-06 DIAGNOSIS — T8149XA Infection following a procedure, other surgical site, initial encounter: Secondary | ICD-10-CM

## 2018-11-06 DIAGNOSIS — E881 Lipodystrophy, not elsewhere classified: Secondary | ICD-10-CM

## 2018-11-06 MED ORDER — HYDROCODONE-ACETAMINOPHEN 5-325 MG PO TABS: 1.0000 | ORAL_TABLET | Freq: Four times a day (QID) | ORAL | 0 refills | Status: DC | PRN

## 2018-11-06 MED ORDER — DOXYCYCLINE HYCLATE 100 MG PO TABS: 100.0000 mg | ORAL_TABLET | Freq: Two times a day (BID) | ORAL | 0 refills | Status: AC

## 2018-11-06 NOTE — Progress Notes (Signed)
Patient ID: Johnny [name], male    DOB: Oct 14, 1960, 58 y.o.   MRN: [id number]   Chief Complaint  Patient presents with  . Follow-up    for Lipodystrophy   Johnny [name] is a 58 yo male who presents for follow up after excision of left thigh lipodystrophy on 10/11/18. The JP drain was removed last Friday. He developed a wound at the lateral aspect of the incision near the popliteal fossa that was draining. The drainage continued over the weekend. Patient states he has been going through $50/day worth of gauze. Denies and fever or chills. Patient states the left leg is painful, especially to touch. Patient states he needs more pain medication. Patient states he is still walking, but with a limp. Patient is wrapping the upper leg in gauze, ABD, kerlex, and ACE wrap. He is also wearing the silver sock. Patient states the left leg swelling is better than last week. Patient states he needs help with dressing changes and supplies.    Review of Systems  Constitutional: Positive for activity change. Negative for chills and fever.  HENT: Negative.   Respiratory: Negative.   Cardiovascular: Negative.   Gastrointestinal: Negative.   Genitourinary: Negative.   Musculoskeletal: Positive for gait problem.       Swelling and pain in left leg  Skin: Positive for wound.    Past Medical History:  Diagnosis Date  . Acquired lymphedema 12/13/2017  . Chronic kidney disease    Chronic Kidney disease-stage unknown  . Diverticulosis of large intestine without hemorrhage 01/31/2012  . Essential HTN 04/21/2006  . Excess skin of thigh    left thigh adipose excess  . Hypertension   . OSA 05/21/2013   does not wear CPAP any longer  . 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 Wears dentures     Past Surgical History:  Procedure Laterality Date  . ABDOMINOPLASTY/PANNICULECTOMY WITH LIPOSUCTION Left 10/11/2018   Procedure: Excision of left thigh lipodystrophy;  Surgeon: Wallace Going, DO;  Location: [place];   Service: Plastics;  Laterality: Left;  . COLONOSCOPY  01/31/2012   Procedure: COLONOSCOPY;  Surgeon: Inda Castle, MD;  Location: [facility];  Service: Endoscopy;  Laterality: N/A;  bmi is 27  . MULTIPLE TOOTH EXTRACTIONS    . NO PAST SURGERIES    . PANNICULECTOMY Bilateral 04/20/2018   Procedure: Excision of right inner thighs;  Surgeon: Wallace Going, DO;  Location: [place];  Service: Plastics;  Laterality: Bilateral;  Excision of right inner thighs      Current Outpatient Medications:  .  aspirin EC 81 MG tablet, Take 81 mg by mouth daily., Disp: , Rfl:  .  carvedilol (COREG) 25 MG tablet, Take 1 tablet (25 mg total) by mouth 2 (two) times daily with a meal., Disp: 30 tablet, Rfl: 0 .  furosemide (LASIX) 40 MG tablet, Take 1 tablet (40 mg total) by mouth daily., Disp: 10 tablet, Rfl: 0 .  hydrochlorothiazide (HYDRODIURIL) 25 MG tablet, TAKE ONE TABLET BY MOUTH  DAILY, Disp: 90 tablet, Rfl: 0 .  RAYALDEE 30 MCG CPCR, Take 30 mcg by mouth daily., Disp: , Rfl: 1 .  spironolactone (ALDACTONE) 100 MG tablet, Take 100 mg by mouth daily., Disp: , Rfl: 9 .  doxycycline (VIBRA-TABS) 100 MG tablet, Take 1 tablet (100 mg total) by mouth 2 (two) times daily for 7 days., Disp: 14 tablet, Rfl: 0 .  HYDROcodone-acetaminophen (NORCO) 5-325 MG tablet, Take 1 tablet by mouth every 6 (six) hours  as needed for moderate pain., Disp: 14 tablet, Rfl: 0   Objective:   Vitals:   11/06/18 0906  BP: 122/86  Pulse: 83  Temp: (!) 97.3 F (36.3 C)  SpO2: 99%    Physical Exam  General: alert, calm, no acute distress HEENT: normocephalic Neck: supple, full ROM Chest: symmetrical rise and fall Lungs: unlabored breathing Cardiac: +2 bilateral radial pulse, cap refill <3 sec Musculoskeletal: MAEx4 Neuro: A&O x3, calm, cooperative, steady gait Extremities: approximated surgical incision extending from left groin to left popliteal fossa, 2 cm x 1.5 cm open wound at popliteal fossa draining moderate to  large amount serosanguinous fluid with multiple purulent clumps, strong foul odor,  tender to touch, no surrounding erythema, Skin: "see extremity"    Assessment & Plan:  Lipodystrophy  Wound infection after surgery  Johnny [name] is a 58 yo male s/p excision of left thigh lipodystrophy on 10/16/18. He had a significant amount of drainage and thick purulent material expel from the wound with palpation of the area. A prescription for doxycycline was ordered. A few pills of norco were ordered. Patient was encouraged to limit the norco for severe pain. The left thigh was wrapped with gauze, ABD x2, kerlex, and ACE wrap. The silver sock was placed on the left foot and lower leg. Will attempt to obtain home health services for wound care. Return in 1 week.     Alfredo Batty, NP

## 2018-11-07 DIAGNOSIS — D631 Anemia in chronic kidney disease: Secondary | ICD-10-CM | POA: Diagnosis not present

## 2018-11-07 DIAGNOSIS — N2581 Secondary hyperparathyroidism of renal origin: Secondary | ICD-10-CM | POA: Diagnosis not present

## 2018-11-07 DIAGNOSIS — I129 Hypertensive chronic kidney disease with stage 1 through stage 4 chronic kidney disease, or unspecified chronic kidney disease: Secondary | ICD-10-CM | POA: Diagnosis not present

## 2018-11-07 DIAGNOSIS — N184 Chronic kidney disease, stage 4 (severe): Secondary | ICD-10-CM | POA: Diagnosis not present

## 2018-11-07 DIAGNOSIS — Z23 Encounter for immunization: Secondary | ICD-10-CM | POA: Diagnosis not present

## 2018-11-08 ENCOUNTER — Telehealth: Payer: Self-pay | Admitting: *Deleted

## 2018-11-08 NOTE — Telephone Encounter (Signed)
Faxed order form for wound supplies to [facility].  Along with a copy of the patient's demographics,insurance card, and last office note.  Confirmation received.  Patient aware.//AB/CMA

## 2018-11-10 ENCOUNTER — Telehealth: Payer: Self-pay

## 2018-11-10 DIAGNOSIS — N184 Chronic kidney disease, stage 4 (severe): Secondary | ICD-10-CM | POA: Diagnosis not present

## 2018-11-10 NOTE — Telephone Encounter (Signed)
Patient called with questions about bandages. Please contact and advise

## 2018-11-10 NOTE — Telephone Encounter (Signed)
Called and spoke with the patient and he wanted to know when the wound supplies will be delivered.  Informed him that they were to try to sent it over night.  Give the patient the number to [facility] and he will give them a call.//AB/CMA

## 2018-11-10 NOTE — Telephone Encounter (Signed)
Called and [facility] @ 11:46am @ [phone]) regarding the message below.//AB/CMA

## 2018-11-13 ENCOUNTER — Telehealth: Payer: Self-pay

## 2018-11-13 NOTE — Telephone Encounter (Signed)

## 2018-11-13 NOTE — Progress Notes (Signed)
   Subjective:     Patient ID: Johnny [name], male    DOB: [birth year], 58 y.o.   MRN: [id number]  Chief Complaint  Patient presents with  . Follow-up    1 week for Lipodystrophy    HPI: The patient is a 58 y.o. male here for follow-up after excision of left thigh Lipodystrophy on 10/11/2018.  At his last visit 1 week ago a fair amount of drainage was expressed from his wound.  He reports that since then the drainage has decreased.  He has not had any fevers, chills, nausea, vomiting.  The pain has improved but he is still taking the Norco prescribed on 14 September as needed.  No complaints.  The drainage appears to be serosanguineous. no sign of infection, purulence, erythema.   Overall his incision is healing well.  There is 2 small wounds approximately 0.2 x 0.2 cm along the length of the incision.  The wound where the drainage is coming from is approximately 0.2 x 3 cm.  Not erythematous.  Review of Systems  Constitutional: Positive for weight loss. Negative for chills, diaphoresis, fever and malaise/fatigue.  Respiratory: Negative for cough, shortness of breath and wheezing.   Cardiovascular: Negative.   Gastrointestinal: Negative.   Genitourinary: Negative.   Musculoskeletal: Negative.   Skin: Negative for itching and rash.       + wound  Neurological: Positive for sensory change. Negative for dizziness, weakness and headaches.     Objective:   Vital Signs BP 139/90 (BP Location: Left Arm, Patient Position: Sitting, Cuff Size: Large)   Pulse 69   Temp (!) 97.1 F (36.2 C) (Temporal)   Ht 5\' 9"  (1.753 m)   Wt (!) 302 lb (137 kg) Comment: Per patient  SpO2 100%   BMI 44.60 kg/m  Vital Signs and Nursing Note Reviewed  Physical Exam  Constitutional: He is oriented to person, place, and time and well-developed, well-nourished, and in no distress. No distress.  HENT:  Head: Normocephalic and atraumatic.  Cardiovascular: Normal rate.  Pulmonary/Chest: Effort normal.   Musculoskeletal:        General: Tenderness and edema present.     Left upper leg: He exhibits tenderness, swelling and edema.       Legs:  Neurological: He is alert and oriented to person, place, and time.  Skin: Skin is warm and dry. No rash noted. He is not diaphoretic. No erythema. No pallor.  Psychiatric: Mood normal.      Assessment/Plan:     ICD-10-CM   1. 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  Recommend saving these for pain at night.  Throughout the day he can use Tylenol and ibuprofen for pain control.  If he needs additional pain medication he can call the office.  Would like to hold off for now, patient agrees.  20 cc of serous fluid expressed from popliteal fossa wound. No sign of infection, seroma, hematoma. Prescription for doxycycline sent in for patient in case he develops any purulent drainage. He understands to only start it if he notices redness, fever,  Purulent drainage.  Follow-up in 2 weeks for reevaluation.  Overall he is doing really well and has made significant improvement since last visit.  Patient reports that he is feeling well and walking better.   Carola Rhine Avedis Bevis, PA-C 11/14/2018, 10:47 AM

## 2018-11-14 ENCOUNTER — Other Ambulatory Visit: Payer: Self-pay

## 2018-11-14 ENCOUNTER — Encounter: Payer: Self-pay | Admitting: Surgical

## 2018-11-14 ENCOUNTER — Ambulatory Visit (INDEPENDENT_AMBULATORY_CARE_PROVIDER_SITE_OTHER): Payer: Medicaid Other | Admitting: Surgical

## 2018-11-14 VITALS — BP 139/90 | HR 69 | Temp 97.1°F | Ht 69.0 in | Wt 302.0 lb

## 2018-11-14 DIAGNOSIS — E881 Lipodystrophy, not elsewhere classified: Secondary | ICD-10-CM

## 2018-11-14 MED ORDER — DOXYCYCLINE HYCLATE 100 MG PO TABS: 100.0000 mg | ORAL_TABLET | Freq: Two times a day (BID) | ORAL | 0 refills | Status: AC

## 2018-11-27 ENCOUNTER — Telehealth: Payer: Self-pay | Admitting: Surgical

## 2018-11-27 NOTE — Telephone Encounter (Signed)

## 2018-11-28 ENCOUNTER — Encounter: Payer: Self-pay | Admitting: Surgical

## 2018-11-28 ENCOUNTER — Ambulatory Visit (INDEPENDENT_AMBULATORY_CARE_PROVIDER_SITE_OTHER): Payer: Medicaid Other | Admitting: Surgical

## 2018-11-28 ENCOUNTER — Other Ambulatory Visit: Payer: Self-pay

## 2018-11-28 VITALS — BP 121/84 | HR 71 | Temp 97.1°F | Ht 69.0 in | Wt 307.2 lb

## 2018-11-28 DIAGNOSIS — E881 Lipodystrophy, not elsewhere classified: Secondary | ICD-10-CM

## 2018-11-28 NOTE — Progress Notes (Signed)
   Subjective:     Patient ID: Johnny [name], male    DOB: Oct 11, 1960, 58 y.o.   MRN: [id number]  Chief Complaint  Patient presents with  . Follow-up    2 weeks    HPI: The patient is a 58 y.o. male here for follow-up after left thigh excision. He is ~ 7 weeks post-op. . Overall he is doing really well.  He reports that the drainage has decreased and he has not had any fevers, chills, nausea, vomiting.  He has not taken any antibiotics.  His incision is C/D/I, no surrounding erythema, no evidence of seroma, hematoma.  No sign of infection.  He still has a small wound in his left popliteal fossa which is draining serous fluid throughout the day.  Over 24-hours, It has been approximately 5 cc or less.  He reports that he has not had to do dressing changes nearly as frequent as prior.  He is over all happy with his progress and has been walking a lot better, but reports he is having some difficulty with stamina due to being sedentary over the past few months and not as active as usual.   Review of Systems  Constitutional: Negative for chills, diaphoresis, fever and malaise/fatigue.  Respiratory: Negative for cough, sputum production, shortness of breath and wheezing.   Cardiovascular: Positive for leg swelling (improving). Negative for chest pain.  Gastrointestinal: Negative for abdominal pain, nausea and vomiting.  Genitourinary: Negative.   Musculoskeletal: Positive for myalgias.  Skin: Negative for itching and rash.  Neurological: Negative for dizziness, tingling, focal weakness, weakness and headaches.     Objective:   Vital Signs BP 121/84 (BP Location: Left Arm, Patient Position: Sitting, Cuff Size: Large)   Pulse 71   Temp (!) 97.1 F (36.2 C) (Temporal)   Ht 5\' 9"  (1.753 m)   Wt (!) 307 lb 3.2 oz (139.3 kg)   SpO2 100%   BMI 45.37 kg/m  Vital Signs and Nursing Note Reviewed  Physical Exam  Constitutional: He is oriented to person, place, and time and  well-developed, well-nourished, and in no distress.  HENT:  Head: Normocephalic and atraumatic.  Cardiovascular: Normal rate.  Pulmonary/Chest: Effort normal.  Musculoskeletal: Normal range of motion.       Legs:  Neurological: He is alert and oriented to person, place, and time. Gait normal.  Skin: Skin is warm and dry. No rash noted. He is not diaphoretic. No erythema. No pallor.  Psychiatric: Mood and affect normal.      Assessment/Plan:     ICD-10-CM   1. 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 Larch is doing really well.  The superior aspect of his incision has softened and the inferior aspect is beginning to soften as well.  It is still slightly hard and indurated but no sign of infection, seroma, hematoma.    Continue with local wound care with to the posterior popliteal wound with daily Vaseline and ABD dressing changes.  No fevers, chills, nausea, vomiting.  He is ambulating a lot better now, but not as well as previously because he reports being less active than normal in the past few months.  Follow-up in 1 month for reevaluation.  Call with questions or concerns prior to then.  We can see him sooner if needed.   Carola Rhine Sukhman Kocher, PA-C 11/28/2018, 11:43 AM

## 2018-12-05 ENCOUNTER — Other Ambulatory Visit: Payer: Self-pay | Admitting: Family Medicine

## 2018-12-06 NOTE — Telephone Encounter (Signed)
Will provide 1 month refill, however please have patient schedule annual exam as it has been >1 year since he has been seen in our office.

## 2018-12-29 ENCOUNTER — Ambulatory Visit (INDEPENDENT_AMBULATORY_CARE_PROVIDER_SITE_OTHER): Payer: Medicaid Other | Admitting: Surgical

## 2018-12-29 ENCOUNTER — Other Ambulatory Visit: Payer: Self-pay

## 2018-12-29 ENCOUNTER — Encounter: Payer: Self-pay | Admitting: Surgical

## 2018-12-29 VITALS — BP 151/100 | HR 80 | Temp 97.5°F | Ht 69.0 in | Wt 300.4 lb

## 2018-12-29 DIAGNOSIS — E881 Lipodystrophy, not elsewhere classified: Secondary | ICD-10-CM

## 2018-12-29 NOTE — Progress Notes (Signed)
   Subjective:     Patient ID: Johnny [name], male    DOB: 09/01/1960, 58 y.o.   MRN: [id number]  Chief Complaint  Patient presents with  . Follow-up    1 mos    HPI: The patient is a 58 y.o. male here for follow-up after excision of left thigh lipodystrophy on 10/16/2018.  Johnny [name] is doing really well.  He reports that the drainage has been very minimal and he has been able to just wear a Band-Aid over the left posterior knee wound.  The opening is approximately 2 mm in size and has a slight area of hypergranulation tissue preventing epithelialization at this time.   He has not had any fever, chills, nausea, vomiting.  He reports that he has been more active than usual and he is getting back to baseline and is very pleased with his progress.  He continues to wear compression socks.  His incision is C/D/I, no dehiscence noted.  No surrounding erythema.  The periincisional area is still hard and some areas, but he reports that has been softening up and he is confident that it well.  He reports that this happened on the other side and it softened up over time.  He is going to continue massaging the area.  He has no complaints or concerns at this time.  Review of Systems  Constitutional: Positive for activity change. Negative for appetite change, chills, diaphoresis, fatigue and fever.  Respiratory: Negative for cough, shortness of breath and wheezing.   Cardiovascular: Negative for leg swelling.  Gastrointestinal: Negative for abdominal distention, abdominal pain, nausea and vomiting.  Musculoskeletal: Negative.   Skin: Positive for wound. Negative for color change, pallor and rash.  Neurological: Negative.      Objective:   Vital Signs BP (!) 151/100 (BP Location: Left Arm, Patient Position: Sitting, Cuff Size: Large)   Pulse 80   Temp (!) 97.5 F (36.4 C) (Temporal)   Ht 5\' 9"  (1.753 m)   Wt (!) 300 lb 6.4 oz (136.3 kg)   SpO2 100%   BMI 44.36 kg/m  Vital Signs and Nursing  Note Reviewed  Physical Exam  Constitutional: He is oriented to person, place, and time and well-developed, well-nourished, and in no distress.  HENT:  Head: Normocephalic and atraumatic.  Cardiovascular: Normal rate.  Pulmonary/Chest: Effort normal.  Musculoskeletal: Normal range of motion.  Neurological: He is alert and oriented to person, place, and time. Gait normal.  Skin: Skin is warm. No rash noted. He is not diaphoretic. No erythema. No pallor.     Postop lipodystrophy incision is C/D/I.  No surrounding erythema.  Psychiatric: Mood and affect normal.      Assessment/Plan:     ICD-10-CM   1. 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 Wound infection after surgery  T81.49XA    No concerns at this time.  He is very happy with his progress.  Area of hyper granulated tissue in the popliteal fossa chemically cauterized with silver nitrate.  4 x 4 gauze and Medipore tape placed over the area to prevent drainage on patient's clothing.  There is no sign of infection, hematoma, seroma at this time  Continue to massage periincisional area to decrease induration and break up any fat necrosis.  Return in 3 months for follow-up visit.  Continue with healthy diet, high-protein low-carb/sugar.  Call with any questions or concerns prior to next visit.   Carola Rhine Marjon Doxtater, PA-C 12/29/2018, 11:33 AM

## 2019-01-11 ENCOUNTER — Other Ambulatory Visit: Payer: Self-pay | Admitting: Family Medicine

## 2019-01-12 NOTE — Telephone Encounter (Signed)
Please have patient make annual visit as it has been >1 year since he has been seen by our office. This will also provide opportunity for him to meet his new PCP. I will provide 1 month refill but after that he will need to be seen before more refills will be provided. Thank you.

## 2019-02-19 ENCOUNTER — Other Ambulatory Visit: Payer: Self-pay | Admitting: Family Medicine

## 2019-02-19 NOTE — Telephone Encounter (Signed)
I will refill this last time due to the holiday. Patient will need to be seen before any more refills will be provided.

## 2019-03-08 ENCOUNTER — Encounter (HOSPITAL_COMMUNITY): Payer: Self-pay | Admitting: Emergency Medicine

## 2019-03-08 ENCOUNTER — Other Ambulatory Visit: Payer: Self-pay

## 2019-03-08 ENCOUNTER — Emergency Department (HOSPITAL_COMMUNITY): Payer: Medicaid Other

## 2019-03-08 ENCOUNTER — Inpatient Hospital Stay (HOSPITAL_COMMUNITY)
Admission: EM | Admit: 2019-03-08 | Discharge: 2019-03-12 | DRG: 177 | Disposition: A | Discharge status: Home / Self Care | Payer: Medicaid Other | Attending: Internal Medicine | Admitting: Internal Medicine

## 2019-03-08 DIAGNOSIS — Z6841 Body Mass Index (BMI) 40.0 and over, adult: Secondary | ICD-10-CM

## 2019-03-08 DIAGNOSIS — U071 COVID-19: Principal | ICD-10-CM | POA: Diagnosis present

## 2019-03-08 DIAGNOSIS — Z79899 Other long term (current) drug therapy: Secondary | ICD-10-CM

## 2019-03-08 DIAGNOSIS — J189 Pneumonia, unspecified organism: Secondary | ICD-10-CM

## 2019-03-08 DIAGNOSIS — R0602 Shortness of breath: Secondary | ICD-10-CM | POA: Diagnosis not present

## 2019-03-08 DIAGNOSIS — Z8249 Family history of ischemic heart disease and other diseases of the circulatory system: Secondary | ICD-10-CM

## 2019-03-08 DIAGNOSIS — Z713 Dietary counseling and surveillance: Secondary | ICD-10-CM

## 2019-03-08 DIAGNOSIS — I890 Lymphedema, not elsewhere classified: Secondary | ICD-10-CM | POA: Diagnosis present

## 2019-03-08 DIAGNOSIS — G4733 Obstructive sleep apnea (adult) (pediatric): Secondary | ICD-10-CM | POA: Diagnosis present

## 2019-03-08 DIAGNOSIS — N189 Chronic kidney disease, unspecified: Secondary | ICD-10-CM | POA: Diagnosis present

## 2019-03-08 DIAGNOSIS — Z20822 Contact with and (suspected) exposure to covid-19: Secondary | ICD-10-CM

## 2019-03-08 DIAGNOSIS — N179 Acute kidney failure, unspecified: Secondary | ICD-10-CM | POA: Diagnosis present

## 2019-03-08 DIAGNOSIS — I10 Essential (primary) hypertension: Secondary | ICD-10-CM | POA: Diagnosis present

## 2019-03-08 DIAGNOSIS — Z888 Allergy status to other drugs, medicaments and biological substances status: Secondary | ICD-10-CM

## 2019-03-08 DIAGNOSIS — I129 Hypertensive chronic kidney disease with stage 1 through stage 4 chronic kidney disease, or unspecified chronic kidney disease: Secondary | ICD-10-CM | POA: Diagnosis present

## 2019-03-08 DIAGNOSIS — Z86711 Personal history of pulmonary embolism: Secondary | ICD-10-CM

## 2019-03-08 DIAGNOSIS — D631 Anemia in chronic kidney disease: Secondary | ICD-10-CM | POA: Diagnosis present

## 2019-03-08 DIAGNOSIS — E871 Hypo-osmolality and hyponatremia: Secondary | ICD-10-CM | POA: Diagnosis present

## 2019-03-08 DIAGNOSIS — E875 Hyperkalemia: Secondary | ICD-10-CM | POA: Diagnosis present

## 2019-03-08 DIAGNOSIS — J1282 Pneumonia due to coronavirus disease 2019: Secondary | ICD-10-CM | POA: Diagnosis present

## 2019-03-08 DIAGNOSIS — Z7982 Long term (current) use of aspirin: Secondary | ICD-10-CM

## 2019-03-08 DIAGNOSIS — E872 Acidosis: Secondary | ICD-10-CM | POA: Diagnosis present

## 2019-03-08 DIAGNOSIS — N184 Chronic kidney disease, stage 4 (severe): Secondary | ICD-10-CM | POA: Diagnosis present

## 2019-03-08 DIAGNOSIS — R7989 Other specified abnormal findings of blood chemistry: Secondary | ICD-10-CM

## 2019-03-08 LAB — CBC WITH DIFFERENTIAL/PLATELET
Abs Immature Granulocytes: 0.06 10*3/uL (ref 0.00–0.07)
Basophils Absolute: 0 10*3/uL (ref 0.0–0.1)
Basophils Relative: 0 %
Eosinophils Absolute: 0.1 10*3/uL (ref 0.0–0.5)
Eosinophils Relative: 2 %
HCT: 40.7 % (ref 39.0–52.0)
Hemoglobin: 12.7 g/dL — ABNORMAL LOW (ref 13.0–17.0)
Immature Granulocytes: 1 %
Lymphocytes Relative: 13 %
Lymphs Abs: 1 10*3/uL (ref 0.7–4.0)
MCH: 27.9 pg (ref 26.0–34.0)
MCHC: 31.2 g/dL (ref 30.0–36.0)
MCV: 89.3 fL (ref 80.0–100.0)
Monocytes Absolute: 0.6 10*3/uL (ref 0.1–1.0)
Monocytes Relative: 9 %
Neutro Abs: 5.5 10*3/uL (ref 1.7–7.7)
Neutrophils Relative %: 75 %
Platelets: 403 10*3/uL — ABNORMAL HIGH (ref 150–400)
RBC: 4.56 MIL/uL (ref 4.22–5.81)
RDW: 15.9 % — ABNORMAL HIGH (ref 11.5–15.5)
WBC: 7.3 10*3/uL (ref 4.0–10.5)
nRBC: 0 % (ref 0.0–0.2)

## 2019-03-08 LAB — D-DIMER, QUANTITATIVE: D-Dimer, Quant: 2.31 ug/mL-FEU — ABNORMAL HIGH (ref 0.00–0.50)

## 2019-03-08 LAB — POC SARS CORONAVIRUS 2 AG -  ED: SARS Coronavirus 2 Ag: NEGATIVE

## 2019-03-08 LAB — TROPONIN I (HIGH SENSITIVITY): Troponin I (High Sensitivity): 4 ng/L (ref <18)

## 2019-03-08 MED ORDER — DEXTROSE 50 % IV SOLN
1.0000 | Freq: Once | INTRAVENOUS | Status: AC
  Administered 2019-03-09: 50 mL via INTRAVENOUS
  Filled 2019-03-08: qty 50

## 2019-03-08 MED ORDER — CALCIUM GLUCONATE 10 % IV SOLN: 1.0000 g | Freq: Once | INTRAVENOUS | Status: DC

## 2019-03-08 MED ORDER — SODIUM BICARBONATE 8.4 % IV SOLN
50.0000 meq | Freq: Once | INTRAVENOUS | Status: AC
  Administered 2019-03-09: 50 meq via INTRAVENOUS

## 2019-03-08 MED ORDER — SODIUM CHLORIDE 0.9 % IV BOLUS
1000.0000 mL | Freq: Once | INTRAVENOUS | Status: AC
  Administered 2019-03-09: 1000 mL via INTRAVENOUS

## 2019-03-08 MED ORDER — CALCIUM GLUCONATE-NACL 1-0.675 GM/50ML-% IV SOLN
1.0000 g | Freq: Once | INTRAVENOUS | Status: AC
  Administered 2019-03-09: 1000 mg via INTRAVENOUS
  Filled 2019-03-08: qty 50

## 2019-03-08 MED ORDER — INSULIN ASPART 100 UNIT/ML IV SOLN
5.0000 [IU] | Freq: Once | INTRAVENOUS | Status: AC
  Administered 2019-03-09: 5 [IU] via INTRAVENOUS
  Filled 2019-03-08: qty 0.05

## 2019-03-08 MED ORDER — SODIUM CHLORIDE 0.9 % IV SOLN
500.0000 mg | Freq: Once | INTRAVENOUS | Status: AC
  Administered 2019-03-09: 01:00:00 500 mg via INTRAVENOUS
  Filled 2019-03-08: qty 500

## 2019-03-08 MED ORDER — SODIUM CHLORIDE 0.9 % IV SOLN: 1.0000 g | Freq: Once | INTRAVENOUS | Status: DC

## 2019-03-08 MED ORDER — ALBUTEROL SULFATE (2.5 MG/3ML) 0.083% IN NEBU
10.0000 mg | INHALATION_SOLUTION | Freq: Once | RESPIRATORY_TRACT | Status: DC
  Filled 2019-03-08: qty 12

## 2019-03-08 MED ORDER — SODIUM CHLORIDE 0.9 % IV SOLN
1.0000 g | Freq: Once | INTRAVENOUS | Status: AC
  Administered 2019-03-09: 02:00:00 1 g via INTRAVENOUS
  Filled 2019-03-08: qty 10

## 2019-03-08 NOTE — ED Provider Notes (Signed)
[facility] DEPT Provider Note   CSN: [id number] Arrival date & time: 03/08/19  1649     History Chief Complaint  Patient presents with  . Shortness of Breath  . Dizziness    Johnny [name] is a 59 y.o. male with PMHx HTN, OSA, CKD, hx of PE who presents to the ED today complaining of dyspnea on exertion x 3 weeks. Pt also complains of a cough. He states that he tested negative for COVID 19 2 weeks ago but has continued to have symptoms which has concerned him.  States he thinks he had a fever couple of weeks ago but this has subsided.  Patient denies any fevers currently, chills, headache, sore throat, ear pain, pain, abdominal pain, nausea, vomiting, diarrhea, any other associated symptoms. He has no other complaints at this time. 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 He is not currently on anticoagulation. No recent prolonged travel or immobilization. No active malignancy. No hemoptysis. No exogenous hormone use.   The history is provided by the patient.       Past Medical History:  Diagnosis Date  . Acquired lymphedema 12/13/2017  . Chronic kidney disease    Chronic Kidney disease-stage unknown  . Diverticulosis of large intestine without hemorrhage 01/31/2012  . Essential HTN 04/21/2006  . Excess skin of thigh    left thigh adipose excess  . Hypertension   . OSA 05/21/2013   does not wear CPAP any longer  . 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 Wears dentures     Patient Active Problem List   Diagnosis Date Noted  . Acute kidney injury superimposed on CKD ([place]) 03/09/2019  . 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 Acquired lymphedema, bilateral thighs 12/13/2017  . Scrotal mass 12/13/2017  . Cystocele, male   . Cellulitis of right thigh 12/12/2017  . Chronic kidney disease   . Hyperkalemia 08/27/2015  . Hyponatremia 08/27/2015  . Cellulitis of left lower extremity without foot 08/27/2015  . Sleep apnea  05/21/2013  . Severe obesity (BMI >= 40) ([place]) 12/02/2011  . Venous stasis of lower extremity 12/02/2011  . LVH (left ventricular hypertrophy) 07/28/2009  . CKD stage 4 secondary to hypertension ([place]) 07/28/2009  . Essential hypertension 04/21/2006    Past Surgical History:  Procedure Laterality Date  . ABDOMINOPLASTY/PANNICULECTOMY WITH LIPOSUCTION Left 10/11/2018   Procedure: Excision of left thigh lipodystrophy;  Surgeon: Wallace Going, DO;  Location: [facility];  Service: Plastics;  Laterality: Left;  . COLONOSCOPY  01/31/2012   Procedure: COLONOSCOPY;  Surgeon: Inda Castle, MD;  Location: [facility];  Service: Endoscopy;  Laterality: N/A;  bmi is 38  . MULTIPLE TOOTH EXTRACTIONS    . NO PAST SURGERIES    . PANNICULECTOMY Bilateral 04/20/2018   Procedure: Excision of right inner thighs;  Surgeon: Wallace Going, DO;  Location: [facility];  Service: Plastics;  Laterality: Bilateral;  Excision of right inner thighs       Family History  Problem Relation Age of Onset  . Breast cancer Mother   . Hypertension Mother   . Colon cancer Father        died at 31  . Hypertension Brother     Social History   Tobacco Use  . Smoking status: Never Smoker  . Smokeless tobacco: Never Used  Substance Use Topics  . Alcohol use: Yes    Comment: social-once a week  . Drug use: No  Comment: stopped marijuana about Sep or Oct 2013    Home Medications Prior to Admission medications   Medication Sig Start Date End Date Taking? Authorizing Provider  aspirin EC 81 MG tablet Take 81 mg by mouth daily.   Yes [provider]  carvedilol (COREG) 25 MG tablet TAKE 1 TABLET BY MOUTH TWICE DAILY WITH A MEAL Patient taking differently: Take 25 mg by mouth 2 (two) times daily with a meal.  02/19/19  Yes Mullis, Kiersten P, DO  furosemide (LASIX) 40 MG tablet Take 1 tablet (40 mg total) by mouth daily. 10/22/15  Yes Grand Detour Bing, DO  RAYALDEE 30 MCG CPCR Take 30 mcg by mouth  daily. 01/17/18  Yes [provider]  spironolactone (ALDACTONE) 100 MG tablet Take 100 mg by mouth daily. 11/30/17  Yes [provider]  hydrochlorothiazide (HYDRODIURIL) 25 MG tablet TAKE ONE TABLET BY MOUTH  DAILY Patient not taking: Reported on 03/08/2019 03/24/15   Patrecia Pour, MD  HYDROcodone-acetaminophen (NORCO) 5-325 MG tablet Take 1 tablet by mouth every 6 (six) hours as needed for moderate pain. Patient not taking: Reported on 03/08/2019 11/06/18   Dozier-Lineberger, Loleta Chance, NP    Allergies    Lisinopril  Review of Systems   Review of Systems  Constitutional: Negative for chills and fever.  Respiratory: Positive for cough and shortness of breath.   Cardiovascular: Negative for chest pain, palpitations and leg swelling.  Gastrointestinal: Negative for abdominal pain, diarrhea, nausea and vomiting.  All other systems reviewed and are negative.   Physical Exam Updated Vital Signs BP 131/83   Pulse 66   Temp 97.7 F (36.5 C)   Resp 18   SpO2 98%   Physical Exam Vitals and nursing note reviewed.  Constitutional:      Appearance: He is not ill-appearing or diaphoretic.  HENT:     Head: Normocephalic and atraumatic.  Eyes:     Conjunctiva/sclera: Conjunctivae normal.  Cardiovascular:     Rate and Rhythm: Normal rate and regular rhythm.     Pulses: Normal pulses.  Pulmonary:     Effort: Pulmonary effort is normal.     Breath sounds: Normal breath sounds. No decreased breath sounds, wheezing, rhonchi or rales.  Chest:     Chest wall: No tenderness.  Abdominal:     Palpations: Abdomen is soft.     Tenderness: There is no abdominal tenderness. There is no guarding or rebound.  Musculoskeletal:     Cervical back: Neck supple.  Skin:    General: Skin is warm and dry.  Neurological:     Mental Status: He is alert.     ED Results / Procedures / Treatments   Labs (all labs ordered are listed, but only abnormal results are displayed) Labs Reviewed   CBC WITH DIFFERENTIAL/PLATELET - Abnormal; Notable for the following components:      Result Value   Hemoglobin 12.7 (*)    RDW 15.9 (*)    Platelets 403 (*)    All other components within normal limits  BASIC METABOLIC PANEL - Abnormal; Notable for the following components:   Sodium 132 (*)    Potassium >7.5 (*)    CO2 9 (*)    BUN 116 (*)    Creatinine, Ser 5.42 (*)    GFR calc non Af Amer 11 (*)    GFR calc Af Amer 12 (*)    All other components within normal limits  D-DIMER, QUANTITATIVE (NOT AT [facility]) - Abnormal; Notable  for the following components:   D-Dimer, Quant 2.31 (*)    All other components within normal limits  RESPIRATORY PANEL BY RT PCR (FLU A&B, COVID)  POC SARS CORONAVIRUS 2 AG -  ED  TROPONIN I (HIGH SENSITIVITY)  TROPONIN I (HIGH SENSITIVITY)    EKG EKG Interpretation  Date/Time:  Thursday March 08 2019 17:09:42 EST Ventricular Rate:  82 PR Interval:  170 QRS Duration: 88 QT Interval:  368 QTC Calculation: 429 R Axis:     Text Interpretation: Sinus rhythm with occasional Premature ventricular complexes Low voltage QRS Cannot rule out Anterior infarct , age undetermined Artifact Abnormal ECG Confirmed by Carmin Muskrat [phone]) on 03/08/2019 11:32:27 PM   Radiology DG Chest 2 View  Result Date: 03/08/2019 CLINICAL DATA:  Shortness of breath EXAM: CHEST - 2 VIEW COMPARISON:  10/05/2015 FINDINGS: Patchy bilateral lower lobe airspace opacities compatible with pneumonia. Heart is normal size. No effusions or acute bony abnormality. IMPRESSION: Patchy bilateral lower lobe airspace disease compatible with pneumonia. Electronically Signed   By: Rolm Baptise M.D.   On: 03/08/2019 18:00    Procedures .Critical Care Performed by: Eustaquio Maize, PA-C Authorized by: Eustaquio Maize, PA-C   Critical care provider statement:    Critical care time (minutes):  45   Critical care was necessary to treat or prevent imminent or life-threatening deterioration of  the following conditions:  Metabolic crisis   Critical care was time spent personally by me on the following activities:  Discussions with consultants, evaluation of patient's response to treatment, examination of patient, ordering and performing treatments and interventions, ordering and review of laboratory studies, ordering and review of radiographic studies, pulse oximetry, re-evaluation of patient's condition, obtaining history from patient or surrogate and review of old charts   (including critical care time)  Medications Ordered in ED Medications  albuterol (PROVENTIL) (2.5 MG/3ML) 0.083% nebulizer solution 10 mg (0 mg Nebulization Hold 03/08/19 2353)  cefTRIAXone (ROCEPHIN) 1 g in sodium chloride 0.9 % 100 mL IVPB (has no administration in time range)  azithromycin (ZITHROMAX) 500 mg in sodium chloride 0.9 % 250 mL IVPB (has no administration in time range)  sodium bicarbonate injection 50 mEq (has no administration in time range)  calcium gluconate 1 g/ 50 mL sodium chloride IVPB (1,000 mg Intravenous New Bag/Given 03/09/19 0011)  insulin aspart (novoLOG) injection 5 Units (5 Units Intravenous Given 03/09/19 0004)    And  dextrose 50 % solution 50 mL (50 mLs Intravenous Given 03/09/19 0006)  sodium chloride 0.9 % bolus 1,000 mL (1,000 mLs Intravenous New Bag/Given 03/09/19 0011)    ED Course  I have reviewed the triage vital signs and the nursing notes.  Pertinent labs & imaging results that were available during my care of the patient were reviewed by me and considered in my medical decision making (see chart for details).  59 year old male who presents to the ED today complaining of shortness of breath that has been ongoing for about 3 weeks.  He states he had fevers earlier in the 3-week.  That have since resolved.  He states he had a negative Covid test 2 weeks ago but has continued to have dyspnea on exertion.  At rest he feels well and has no shortness of breath.  He is denying any  chest pain.  No other complaints at this time.  he does have history of PE unprovoked in the 90s.  Not currently anticoagulated.  On arrival to the ED patient is afebrile, nontachycardic and nontachypneic.  Have a history of high blood pressure and CKD stage III.  A chest x-ray was obtained prior to being seen which was findings of bilateral airspace opacities concerning for pneumonia.  His chest x-ray does appear consistent with Covid.  Will obtain point-of-care test at this time and may consider 2-hour test given patient is having symptoms if he needs admission.   Rapid Covid test negative.  CBC without leukocytosis.  Hemoglobin stable.  D-dimer elevated at 2.31.  Will order CTA at this time. Initial troponin of 4.   Staff called to inform of critical value, potassium greater than 7.5.  Creatinine elevated at 5.42, patient's baseline around 3.5.  Will give fluids at this time.  Will initiate hyperkalemia order set with calcium gluconate, insulin, glucose, bicarb, albuterol.  She will need to be admitted with this value.  Will consult nephrology at this time.  Unfortunately will not be able to obtain CTA with this worsening kidney function.  May need VQ scan during hospitalization.  Clinical Course as of Mar 08 42  Thu Mar 08, 2019  2314 D-Dimer, Quant(!): 2.31 [MV]  2339 Potassium(!!): >7.5 [MV]  2339 Creatinine(!): 5.42 [MV]    Clinical Course User Index [MV] Eustaquio Maize, PA-C   Discussed case with Dr. Jonelle Sidle who will admit patient. I am awaiting nephrology recommendations at this time.   12:44 AM At shift change case signed out to Joline Maxcy, PA-C, who will discuss case with nephrology. Dr. Jonelle Sidle should be updated on whether nephrology wants patient to stay here or at [facility].  MDM Rules/Calculators/A&P                       Final Clinical Impression(s) / ED Diagnoses Final diagnoses:  AKI (acute kidney injury) ([place])  Hyperkalemia  Shortness of breath  Elevated d-dimer  Person  under investigation for COVID-19  Pneumonia of both lungs due to infectious organism, unspecified part of lung    Rx / DC Orders ED Discharge Orders    None       Eustaquio Maize, PA-C 03/09/19 0045    Carmin Muskrat, MD 03/12/19 [phone]

## 2019-03-08 NOTE — ED Triage Notes (Signed)
Pt reports SOb with exertion for couple months. Reports worse in past 3 weeks with dizziness.

## 2019-03-08 NOTE — ED Notes (Signed)
Unsuccessful IV attempt x2. D-dimer clicked off in error. Primary RN made aware.

## 2019-03-08 NOTE — ED Notes (Signed)
Date and time results received: 03/08/19 11:22 PM  (use smartphrase ".now" to insert current time)  Test: Potassium Critical Value: Greater than 7.5  Name of Provider Notified: Carmin Muskrat, MD  Orders Received? Or Actions Taken?:

## 2019-03-09 DIAGNOSIS — Z86711 Personal history of pulmonary embolism: Secondary | ICD-10-CM | POA: Diagnosis not present

## 2019-03-09 DIAGNOSIS — I129 Hypertensive chronic kidney disease with stage 1 through stage 4 chronic kidney disease, or unspecified chronic kidney disease: Secondary | ICD-10-CM | POA: Diagnosis not present

## 2019-03-09 DIAGNOSIS — D631 Anemia in chronic kidney disease: Secondary | ICD-10-CM | POA: Diagnosis not present

## 2019-03-09 DIAGNOSIS — N184 Chronic kidney disease, stage 4 (severe): Secondary | ICD-10-CM | POA: Diagnosis not present

## 2019-03-09 DIAGNOSIS — J1282 Pneumonia due to coronavirus disease 2019: Secondary | ICD-10-CM | POA: Diagnosis not present

## 2019-03-09 DIAGNOSIS — G4733 Obstructive sleep apnea (adult) (pediatric): Secondary | ICD-10-CM | POA: Diagnosis not present

## 2019-03-09 DIAGNOSIS — Z8249 Family history of ischemic heart disease and other diseases of the circulatory system: Secondary | ICD-10-CM | POA: Diagnosis not present

## 2019-03-09 DIAGNOSIS — Z713 Dietary counseling and surveillance: Secondary | ICD-10-CM | POA: Diagnosis not present

## 2019-03-09 DIAGNOSIS — J168 Pneumonia due to other specified infectious organisms: Secondary | ICD-10-CM | POA: Diagnosis not present

## 2019-03-09 DIAGNOSIS — N179 Acute kidney failure, unspecified: Secondary | ICD-10-CM | POA: Diagnosis present

## 2019-03-09 DIAGNOSIS — E875 Hyperkalemia: Secondary | ICD-10-CM | POA: Diagnosis not present

## 2019-03-09 DIAGNOSIS — Z79899 Other long term (current) drug therapy: Secondary | ICD-10-CM | POA: Diagnosis not present

## 2019-03-09 DIAGNOSIS — I890 Lymphedema, not elsewhere classified: Secondary | ICD-10-CM | POA: Diagnosis not present

## 2019-03-09 DIAGNOSIS — R0602 Shortness of breath: Secondary | ICD-10-CM | POA: Diagnosis not present

## 2019-03-09 DIAGNOSIS — R7989 Other specified abnormal findings of blood chemistry: Secondary | ICD-10-CM

## 2019-03-09 DIAGNOSIS — U071 COVID-19: Secondary | ICD-10-CM | POA: Diagnosis not present

## 2019-03-09 DIAGNOSIS — Z888 Allergy status to other drugs, medicaments and biological substances status: Secondary | ICD-10-CM | POA: Diagnosis not present

## 2019-03-09 DIAGNOSIS — N189 Chronic kidney disease, unspecified: Secondary | ICD-10-CM | POA: Diagnosis not present

## 2019-03-09 DIAGNOSIS — Z6841 Body Mass Index (BMI) 40.0 and over, adult: Secondary | ICD-10-CM | POA: Diagnosis not present

## 2019-03-09 DIAGNOSIS — R918 Other nonspecific abnormal finding of lung field: Secondary | ICD-10-CM | POA: Diagnosis not present

## 2019-03-09 DIAGNOSIS — Z7982 Long term (current) use of aspirin: Secondary | ICD-10-CM | POA: Diagnosis not present

## 2019-03-09 DIAGNOSIS — E872 Acidosis: Secondary | ICD-10-CM | POA: Diagnosis not present

## 2019-03-09 DIAGNOSIS — I10 Essential (primary) hypertension: Secondary | ICD-10-CM | POA: Diagnosis not present

## 2019-03-09 DIAGNOSIS — J189 Pneumonia, unspecified organism: Secondary | ICD-10-CM | POA: Diagnosis not present

## 2019-03-09 DIAGNOSIS — E871 Hypo-osmolality and hyponatremia: Secondary | ICD-10-CM | POA: Diagnosis not present

## 2019-03-09 LAB — BASIC METABOLIC PANEL
Anion gap: 12 (ref 5–15)
Anion gap: 9 (ref 5–15)
BUN: 116 mg/dL — ABNORMAL HIGH (ref 6–20)
BUN: 103 mg/dL — ABNORMAL HIGH (ref 6–20)
CO2: 9 mmol/L — ABNORMAL LOW (ref 22–32)
CO2: 16 mmol/L — ABNORMAL LOW (ref 22–32)
Calcium: 9.6 mg/dL (ref 8.9–10.3)
Calcium: 9 mg/dL (ref 8.9–10.3)
Chloride: 111 mmol/L (ref 98–111)
Chloride: 111 mmol/L (ref 98–111)
Creatinine, Ser: 5.42 mg/dL — ABNORMAL HIGH (ref 0.61–1.24)
Creatinine, Ser: 4.87 mg/dL — ABNORMAL HIGH (ref 0.61–1.24)
GFR calc Af Amer: 12 mL/min — ABNORMAL LOW (ref >60)
GFR calc Af Amer: 14 mL/min — ABNORMAL LOW (ref >60)
GFR calc non Af Amer: 11 mL/min — ABNORMAL LOW (ref >60)
GFR calc non Af Amer: 12 mL/min — ABNORMAL LOW (ref >60)
Glucose, Bld: 93 mg/dL (ref 70–99)
Glucose, Bld: 110 mg/dL — ABNORMAL HIGH (ref 70–99)
Potassium: >7.5 mmol/L (ref 3.5–5.1)
Potassium: 6.7 mmol/L (ref 3.5–5.1)
Sodium: 132 mmol/L — ABNORMAL LOW (ref 135–145)
Sodium: 136 mmol/L (ref 135–145)

## 2019-03-09 LAB — CBC
HCT: 34.6 % — ABNORMAL LOW (ref 39.0–52.0)
Hemoglobin: 11.2 g/dL — ABNORMAL LOW (ref 13.0–17.0)
MCH: 28.3 pg (ref 26.0–34.0)
MCHC: 32.4 g/dL (ref 30.0–36.0)
MCV: 87.4 fL (ref 80.0–100.0)
Platelets: 351 10*3/uL (ref 150–400)
RBC: 3.96 MIL/uL — ABNORMAL LOW (ref 4.22–5.81)
RDW: 15.7 % — ABNORMAL HIGH (ref 11.5–15.5)
WBC: 5.6 10*3/uL (ref 4.0–10.5)
nRBC: 0 % (ref 0.0–0.2)

## 2019-03-09 LAB — RENAL FUNCTION PANEL
Albumin: 2.7 g/dL — ABNORMAL LOW (ref 3.5–5.0)
Anion gap: 8 (ref 5–15)
BUN: 97 mg/dL — ABNORMAL HIGH (ref 6–20)
CO2: 15 mmol/L — ABNORMAL LOW (ref 22–32)
Calcium: 8.7 mg/dL — ABNORMAL LOW (ref 8.9–10.3)
Chloride: 111 mmol/L (ref 98–111)
Creatinine, Ser: 4.83 mg/dL — ABNORMAL HIGH (ref 0.61–1.24)
GFR calc Af Amer: 14 mL/min — ABNORMAL LOW (ref >60)
GFR calc non Af Amer: 12 mL/min — ABNORMAL LOW (ref >60)
Glucose, Bld: 91 mg/dL (ref 70–99)
Phosphorus: 4.9 mg/dL — ABNORMAL HIGH (ref 2.5–4.6)
Potassium: 7.2 mmol/L (ref 3.5–5.1)
Sodium: 134 mmol/L — ABNORMAL LOW (ref 135–145)

## 2019-03-09 LAB — HIV ANTIBODY (ROUTINE TESTING W REFLEX): HIV Screen 4th Generation wRfx: NONREACTIVE

## 2019-03-09 LAB — TROPONIN I (HIGH SENSITIVITY): Troponin I (High Sensitivity): 4 ng/L (ref <18)

## 2019-03-09 LAB — RESPIRATORY PANEL BY RT PCR (FLU A&B, COVID)
Influenza A by PCR: NEGATIVE
Influenza B by PCR: NEGATIVE
SARS Coronavirus 2 by RT PCR: POSITIVE — AB

## 2019-03-09 MED ORDER — SODIUM POLYSTYRENE SULFONATE 15 GM/60ML PO SUSP: 30.0000 g | Freq: Once | ORAL | Status: DC

## 2019-03-09 MED ORDER — HEPARIN SODIUM (PORCINE) 5000 UNIT/ML IJ SOLN
5000.0000 [IU] | Freq: Three times a day (TID) | INTRAMUSCULAR | Status: DC
  Administered 2019-03-09 – 2019-03-12 (×11): 5000 [IU] via SUBCUTANEOUS
  Filled 2019-03-09 (×11): qty 1

## 2019-03-09 MED ORDER — SODIUM CHLORIDE 0.9 % IV BOLUS
500.0000 mL | Freq: Once | INTRAVENOUS | Status: AC
  Administered 2019-03-09: 08:00:00 500 mL via INTRAVENOUS

## 2019-03-09 MED ORDER — NEPRO/CARBSTEADY PO LIQD
237.0000 mL | Freq: Three times a day (TID) | ORAL | Status: DC | PRN
  Filled 2019-03-09: qty 237

## 2019-03-09 MED ORDER — SORBITOL 70 % SOLN
30.0000 mL | Status: DC | PRN
  Filled 2019-03-09: qty 30

## 2019-03-09 MED ORDER — HYDROXYZINE HCL 25 MG PO TABS: 25.0000 mg | ORAL_TABLET | Freq: Three times a day (TID) | ORAL | Status: DC | PRN

## 2019-03-09 MED ORDER — SODIUM BICARBONATE 8.4 % IV SOLN
50.0000 meq | Freq: Once | INTRAVENOUS | Status: AC
  Administered 2019-03-09: 07:00:00 50 meq via INTRAVENOUS
  Filled 2019-03-09: qty 50

## 2019-03-09 MED ORDER — ACETAMINOPHEN 650 MG RE SUPP: 650.0000 mg | Freq: Four times a day (QID) | RECTAL | Status: DC | PRN

## 2019-03-09 MED ORDER — ONDANSETRON HCL 4 MG/2ML IJ SOLN: 4.0000 mg | Freq: Four times a day (QID) | INTRAMUSCULAR | Status: DC | PRN

## 2019-03-09 MED ORDER — STERILE WATER FOR INJECTION IV SOLN
INTRAVENOUS | Status: DC
  Filled 2019-03-09 (×5): qty 850

## 2019-03-09 MED ORDER — ONDANSETRON HCL 4 MG PO TABS: 4.0000 mg | ORAL_TABLET | Freq: Four times a day (QID) | ORAL | Status: DC | PRN

## 2019-03-09 MED ORDER — SODIUM POLYSTYRENE SULFONATE 15 GM/60ML PO SUSP
15.0000 g | Freq: Once | ORAL | Status: AC
  Administered 2019-03-09: 14:00:00 15 g via ORAL
  Filled 2019-03-09: qty 60

## 2019-03-09 MED ORDER — FUROSEMIDE 10 MG/ML IJ SOLN
40.0000 mg | Freq: Once | INTRAMUSCULAR | Status: AC
  Administered 2019-03-09: 02:00:00 40 mg via INTRAVENOUS
  Filled 2019-03-09: qty 4

## 2019-03-09 MED ORDER — SODIUM POLYSTYRENE SULFONATE 15 GM/60ML PO SUSP
30.0000 g | Freq: Once | ORAL | Status: AC
  Administered 2019-03-09: 11:00:00 30 g via ORAL
  Filled 2019-03-09: qty 120

## 2019-03-09 MED ORDER — CAMPHOR-MENTHOL 0.5-0.5 % EX LOTN
1.0000 "application " | TOPICAL_LOTION | Freq: Three times a day (TID) | CUTANEOUS | Status: DC | PRN
  Filled 2019-03-09: qty 222

## 2019-03-09 MED ORDER — SODIUM CHLORIDE 0.45 % IV SOLN: INTRAVENOUS | Status: DC

## 2019-03-09 MED ORDER — SODIUM ZIRCONIUM CYCLOSILICATE 10 G PO PACK
10.0000 g | PACK | Freq: Every day | ORAL | Status: DC
  Administered 2019-03-09: 02:00:00 10 g via ORAL
  Filled 2019-03-09: qty 1

## 2019-03-09 MED ORDER — CALCIUM CARBONATE ANTACID 1250 MG/5ML PO SUSP
500.0000 mg | Freq: Four times a day (QID) | ORAL | Status: DC | PRN
  Filled 2019-03-09: qty 5

## 2019-03-09 MED ORDER — DOCUSATE SODIUM 283 MG RE ENEM
1.0000 | ENEMA | RECTAL | Status: DC | PRN
  Filled 2019-03-09: qty 1

## 2019-03-09 MED ORDER — ZOLPIDEM TARTRATE 5 MG PO TABS: 5.0000 mg | ORAL_TABLET | Freq: Every evening | ORAL | Status: DC | PRN

## 2019-03-09 MED ORDER — ACETAMINOPHEN 325 MG PO TABS: 650.0000 mg | ORAL_TABLET | Freq: Four times a day (QID) | ORAL | Status: DC | PRN

## 2019-03-09 MED ORDER — SODIUM POLYSTYRENE SULFONATE 15 GM/60ML PO SUSP
30.0000 g | Freq: Once | ORAL | Status: AC
  Administered 2019-03-09: 07:00:00 30 g via ORAL
  Filled 2019-03-09: qty 120

## 2019-03-09 MED ORDER — SODIUM ZIRCONIUM CYCLOSILICATE 10 G PO PACK
10.0000 g | PACK | Freq: Two times a day (BID) | ORAL | Status: DC
  Administered 2019-03-09 – 2019-03-11 (×4): 10 g via ORAL
  Filled 2019-03-09 (×4): qty 1

## 2019-03-09 MED ORDER — CALCIUM GLUCONATE-NACL 1-0.675 GM/50ML-% IV SOLN
1.0000 g | Freq: Once | INTRAVENOUS | Status: AC
  Administered 2019-03-09: 07:00:00 1000 mg via INTRAVENOUS
  Filled 2019-03-09: qty 50

## 2019-03-09 NOTE — ED Notes (Signed)
Debbie, Financial controller, paged the hospitalist in regards to the critically high potassium level

## 2019-03-09 NOTE — Progress Notes (Signed)
[facility] PROGRESS NOTE    Progress Note  Johnny [name]  GYI:[id number] DOB: 1960-11-15 DOA: 03/08/2019 PCP: Danna Hefty, DO     Brief Narrative:   Johnny [name] is an 59 y.o. male past medical history significant for chronic kidney disease stage IV being followed by nephrologist, LVH, diverticulosis obstructive sleep apnea essential hypertension comes into the hospital with progressive shortness of breath on exertion which has been going on for several days, he also reports lower extremity swelling with weakness and dizziness.  Was found in the ED with new hyperkalemia significantly elevated BUN and creatinine.  Renal has been consulted.  Assessment/Plan:   Hyperkalemia: Likely secondary to worsening renal failure, the patient did receive insulin D50, and oral Kayexalate. We will transfer the patient to [facility], nephrology has been consulted. His twelve-lead EKG showed peaked T waves no changes in interval, no bradycardia. We will give him Kayexalate every 4 hours.  IV fluids will give him IV Lasix to try to bring down his potassium fluid overload. He does not have a fistula, as when he saw Dr. Doren Custard as an outpatient he refused. We will start him on Kayexalate every 4 hours.  We will continue IV fluids.  Acute kidney injury on chronic kidney disease stage IV-V: With a baseline creatinine of 3.9-4.2. On admission 5.2, he was started on gentle IV fluid hydration his creatinine is marginally improved, will continue IV fluids with close monitoring.  High anion gap metabolic acidosis: Likely due to renal disease  Essential hypertension Blood pressure is ranging from 1 20-1 40, will give him a single dose of Lasix.  DVT prophylaxis: lovenox Family Communication:none Disposition Plan/Barrier to D/C: unable to determine Code Status:     Code Status Orders  (From admission, onward)         Start     Ordered   03/09/19 0121  Full code  Continuous     03/09/19 0120        Code Status History    Date Active Date Inactive Code Status Order ID Comments User Context   12/12/2017 2305 12/14/2017 1720 Full Code [id number]  Kathrene Alu, MD Inpatient   Advance Care Planning Activity        IV Access:    Peripheral IV   Procedures and diagnostic studies:   DG Chest 2 View  Result Date: 03/08/2019 CLINICAL DATA:  Shortness of breath EXAM: CHEST - 2 VIEW COMPARISON:  10/05/2015 FINDINGS: Patchy bilateral lower lobe airspace opacities compatible with pneumonia. Heart is normal size. No effusions or acute bony abnormality. IMPRESSION: Patchy bilateral lower lobe airspace disease compatible with pneumonia. Electronically Signed   By: Rolm Baptise M.D.   On: 03/08/2019 18:00     Medical Consultants:    None.  Anti-Infectives:   None  Subjective:    Johnny [name] no complaints.  Objective:    Vitals:   03/09/19 0400 03/09/19 0519 03/09/19 0600 03/09/19 0630  BP: (!) 144/61 125/81 121/77 127/82  Pulse: 67 65 68 72  Resp: (!) 21 20 (!) 22 17  Temp:      SpO2: 95% 96% 95% 96%   SpO2: 96 %   Intake/Output Summary (Last 24 hours) at 03/09/2019 0701 Last data filed at 03/09/2019 0257 Gross per 24 hour  Intake 1400 ml  Output --  Net 1400 ml   There were no vitals filed for this visit.  Exam: General exam: In no acute distress. Respiratory system: Good air movement and clear to  auscultation. Cardiovascular system: S1 & S2 heard, RRR. No JVD. Gastrointestinal system: Abdomen is nondistended, soft and nontender.  Central nervous system: Alert and oriented. No focal neurological deficits. Extremities: No pedal edema. Skin: No rashes, lesions or ulcers Psychiatry: Judgement and insight appear normal. Mood & affect appropriate.  Data Reviewed:    Labs: Basic Metabolic Panel: Recent Labs  Lab 03/08/19 2155 03/09/19 0517  NA 132* 134*  K >7.5* 7.2*  CL 111 111  CO2 9* 15*  GLUCOSE 93 91  BUN 116* PENDING  CREATININE  5.42* 4.83*  CALCIUM 9.6 8.7*  PHOS  --  4.9*   GFR CrCl cannot be calculated (Unknown ideal weight.). Liver Function Tests: Recent Labs  Lab 03/09/19 0517  ALBUMIN 2.7*   No results for input(s): LIPASE, AMYLASE in the last 168 hours. No results for input(s): AMMONIA in the last 168 hours. Coagulation profile No results for input(s): INR, PROTIME in the last 168 hours. COVID-19 Labs  Recent Labs    03/08/19 2241  DDIMER 2.31*    Lab Results  Component Value Date   SARSCOV2NAA POSITIVE (A) 03/09/2019   [facility] NEGATIVE 10/07/2018    CBC: Recent Labs  Lab 03/08/19 2155 03/09/19 0517  WBC 7.3 5.6  NEUTROABS 5.5  --   HGB 12.7* 11.2*  HCT 40.7 34.6*  MCV 89.3 87.4  PLT 403* 351   Cardiac Enzymes: No results for input(s): CKTOTAL, CKMB, CKMBINDEX, TROPONINI in the last 168 hours. BNP (last 3 results) No results for input(s): PROBNP in the last 8760 hours. CBG: No results for input(s): GLUCAP in the last 168 hours. D-Dimer: Recent Labs    03/08/19 2241  DDIMER 2.31*   Hgb A1c: No results for input(s): HGBA1C in the last 72 hours. Lipid Profile: No results for input(s): CHOL, HDL, LDLCALC, TRIG, CHOLHDL, LDLDIRECT in the last 72 hours. Thyroid function studies: No results for input(s): TSH, T4TOTAL, T3FREE, THYROIDAB in the last 72 hours.  Invalid input(s): FREET3 Anemia work up: No results for input(s): VITAMINB12, FOLATE, FERRITIN, TIBC, IRON, RETICCTPCT in the last 72 hours. Sepsis Labs: Recent Labs  Lab 03/08/19 2155 03/09/19 0517  WBC 7.3 5.6   Microbiology Recent Results (from the past 240 hour(s))  Respiratory Panel by RT PCR (Flu A&B, Covid) - Nasopharyngeal Swab     Status: Abnormal   Collection Time: 03/09/19 12:01 AM   Specimen: Nasopharyngeal Swab  Result Value Ref Range Status   SARS Coronavirus 2 by RT PCR POSITIVE (A) NEGATIVE Final    Comment: CRITICAL RESULT CALLED TO, READ BACK BY AND VERIFIED WITH: RN REBECCA AT 0410  03/09/19 [place] A (NOTE) SARS-CoV-2 target nucleic acids are DETECTED. SARS-CoV-2 RNA is generally detectable in upper respiratory specimens  during the acute phase of infection. Positive results are indicative of the presence of the identified virus, but do not rule out bacterial infection or co-infection with other pathogens not detected by the test. Clinical correlation with patient history and other diagnostic information is necessary to determine patient infection status. The expected result is Negative. Fact Sheet for Patients:  PinkCheek.be Fact Sheet for Healthcare Providers: GravelBags.it This test is not yet approved or cleared by the [place] FDA and  has been authorized for detection and/or diagnosis of SARS-CoV-2 by FDA under an Emergency Use Authorization (EUA).  This EUA will remain in effect (meaning this test  can be used) for the duration of  the COVID-19 declaration under Section 564(b)(1) of the Act, 21 U.S.C. section 360bbb-3(b)(1), unless  the authorization is terminated or revoked sooner.    Influenza A by PCR NEGATIVE NEGATIVE Final   Influenza B by PCR NEGATIVE NEGATIVE Final    Comment: (NOTE) The Xpert Xpress SARS-CoV-2/FLU/RSV assay is intended as an aid in  the diagnosis of influenza from Nasopharyngeal swab specimens and  should not be used as a sole basis for treatment. Nasal washings and  aspirates are unacceptable for Xpert Xpress SARS-CoV-2/FLU/RSV  testing. Fact Sheet for Patients: PinkCheek.be Fact Sheet for Healthcare Providers: GravelBags.it This test is not yet approved or cleared by the [place] FDA and  has been authorized for detection and/or diagnosis of SARS-CoV-2 by  FDA under an Emergency Use Authorization (EUA). This EUA will remain  in effect (meaning this test can be used) for the duration of the  Covid-19  declaration under Section 564(b)(1) of the Act, 21  U.S.C. section 360bbb-3(b)(1), unless the authorization is  terminated or revoked. Performed at [facility], [place] [address], [place], [place] [address]      Medications:   . albuterol  10 mg Nebulization Once  . heparin  5,000 Units Subcutaneous Q8H  . sodium zirconium cyclosilicate  10 g Oral Daily   Continuous Infusions: . sodium chloride 75 mL/hr at 03/09/19 0226  . calcium gluconate 1,000 mg (03/09/19 0634)      LOS: 0 days   Charlynne Cousins  [facility]  03/09/2019, 7:01 AM

## 2019-03-09 NOTE — ED Notes (Signed)
2nd call 0026,3rd call 0126

## 2019-03-09 NOTE — Progress Notes (Signed)
Patient transferred from [facility] ED to [facility] and I am in agreement with Dr. Louretta Shorten and Dr. Bess Harvest Ortiz's notes and assessments.  Awaiting Nephrology consultation currently.

## 2019-03-09 NOTE — ED Provider Notes (Signed)
59 year old male received at signout from [place] pending nephrology consult. Per her HPI:   "Alison Breeding is a 59 y.o. male with PMHx HTN, OSA, CKD, hx of PE who presents to the ED today complaining of dyspnea on exertion x 3 weeks. Pt also complains of a cough. He states that he tested negative for COVID 19 2 weeks ago but has continued to have symptoms which has concerned him.  States he thinks he had a fever couple of weeks ago but this has subsided.  Patient denies any fevers currently, chills, headache, sore throat, ear pain, pain, abdominal pain, nausea, vomiting, diarrhea, any other associated symptoms. He has no other complaints at this time. 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 He is not currently on anticoagulation. No recent prolonged travel or immobilization. No active malignancy. No hemoptysis. No exogenous hormone use.   The history is provided by the patient."  On my evaluation, the patient denies any recent weight gain.  Reports that he is actually lost weight.  Denies any abdominal swelling or distention or pain or swelling to the bilateral lower extremities.   Physical Exam  BP (!) 143/88   Pulse 66   Temp 97.7 F (36.5 C)   Resp 18   SpO2 99%   Physical Exam Vitals and nursing note reviewed.  Constitutional:      Appearance: He is well-developed.     Comments: Chronically ill-appearing.   HENT:     Head: Normocephalic.  Eyes:     Conjunctiva/sclera: Conjunctivae normal.  Cardiovascular:     Rate and Rhythm: Normal rate and regular rhythm.     Heart sounds: No murmur.  Pulmonary:     Effort: Pulmonary effort is normal.     Comments: No increased work of breathing on room air. Abdominal:     General: There is no distension.     Palpations: Abdomen is soft. There is no mass.     Tenderness: There is no abdominal tenderness. There is no right CVA tenderness, left CVA tenderness, guarding or rebound.     Hernia: No hernia is present.   Comments: Abdomen is obese, but soft, nontender, nondistended.  Musculoskeletal:     Cervical back: Neck supple.     Right lower leg: No edema.     Left lower leg: No edema.  Skin:    General: Skin is warm and dry.  Neurological:     Mental Status: He is alert.  Psychiatric:        Behavior: Behavior normal.     ED Course/Procedures   Clinical Course as of Mar 08 40  Thu Mar 08, 2019  2314 D-Dimer, Quant(!): 2.31 [MV]  2339 Potassium(!!): >7.5 [MV]  2339 Creatinine(!): 5.42 [MV]    Clinical Course User Index [MV] Eustaquio Maize, PA-C    Procedures  MDM   59 year old male with PMHx HTN, OSA, CKD, hx of PE received at signout from [place] pending nephrology consult.  Please see her note for further work-up and medical decision making.  The patient was also previously seen and evaluated by Dr. Vanita Panda with [facility].  The patient is followed in the outpatient setting by Dr. Posey Pronto.  He has not currently on hemodialysis.  Dr. Jonelle Sidle plans to admit the patient following nephrology consult to determine if the patient needs to be transferred to [facility] for hemodialysis.  Spoke with Dr. Marval Regal, nephrology, regarding the patient.  In addition to medications already given,  he recommends giving the patient a dose of Lokelma and IV Lasix and transferring the patient to [facility].  He request that the patient's potassium level be rechecked once he is transferred to determine if the patient will need emergent hemodialysis.  Paged Dr. Jonelle Sidle, but unfortunately his shift has ended.  Spoke with Dr. Shanon Brow regarding need for transfer to [facility] who will accept the patient for admission with transfer to [facility].  Repeat COVID-19 test is pending since point-of-care test was negative.  The patient appears reasonably stabilized for admission considering the current resources, flow, and capabilities available in the ED at this time, and I doubt any other [facility] requiring further screening  and/or treatment in the ED prior to admission.       Joline Maxcy A, PA-C 03/09/19 0221    Shanon Rosser, MD 03/09/19 [phone]

## 2019-03-09 NOTE — H&P (Signed)
History and Physical   Johnny [name] BHA:[id number] DOB: September 23, 1960 DOA: 03/08/2019  Referring MD/NP/PA: Dr. Vanita Panda  PCP: Danna Hefty, DO   Outpatient Specialists: Dr. Posey Pronto, nephrology  Patient coming from: Home  Chief Complaint: Shortness of breath and exertion  HPI: Johnny [name] is a 59 y.o. male with medical history significant of chronic kidney disease stage IV being followed by nephrology, history of LVH, diverticulosis, obstructive sleep apnea, hypertension, and acquired lymphedema who came in with progressive shortness of breath with exertion which has been going on for a few months but got worse in the last day or 2.  Patient also reported worsening swelling.  He is still making some urine.  But noted not as much as he used to.  He is also felt weak and dizzy.  He was seen in the ER and evaluated.  Potassium is more than 7.5 and BUN and creatinine have gotten worse.  Patient is being admitted with significant hyperkalemia and acute on chronic kidney failure.  He is denying any open sores in NSAID use.  No recent nausea vomiting or diarrhea.  He appears dehydrated however..  ED Course: Temperature is 97.7 blood pressure 143/88 pulse 75 respiratory 24 and oxygen sat 98% on room air.  White count 7.3 hemoglobin 12.7 and platelets 403.  Sodium 132 potassium more than 7.5 chloride 111 CO2 of 9 BUN 169 creatinine 5.42.  Calcium 9.6.  Creatinine was 3.94 in August of last year.  Patient is being admitted to the hospital for treatment.  He has received calcium carbonate as well as insulin and D50 in the ER.  Review of Systems: As per HPI otherwise 10 point review of systems negative.    Past Medical History:  Diagnosis Date  . Acquired lymphedema 12/13/2017  . Chronic kidney disease    Chronic Kidney disease-stage unknown  . Diverticulosis of large intestine without hemorrhage 01/31/2012  . Essential HTN 04/21/2006  . Excess skin of thigh    left thigh adipose excess  .  Hypertension   . OSA 05/21/2013   does not wear CPAP any longer  . 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 Wears dentures     Past Surgical History:  Procedure Laterality Date  . ABDOMINOPLASTY/PANNICULECTOMY WITH LIPOSUCTION Left 10/11/2018   Procedure: Excision of left thigh lipodystrophy;  Surgeon: Wallace Going, DO;  Location: [facility];  Service: Plastics;  Laterality: Left;  . COLONOSCOPY  01/31/2012   Procedure: COLONOSCOPY;  Surgeon: Inda Castle, MD;  Location: [facility];  Service: Endoscopy;  Laterality: N/A;  bmi is 28  . MULTIPLE TOOTH EXTRACTIONS    . NO PAST SURGERIES    . PANNICULECTOMY Bilateral 04/20/2018   Procedure: Excision of right inner thighs;  Surgeon: Wallace Going, DO;  Location: [facility];  Service: Plastics;  Laterality: Bilateral;  Excision of right inner thighs     reports that he has never smoked. He has never used smokeless tobacco. He reports current alcohol use. He reports that he does not use drugs.  Allergies  Allergen Reactions  . Lisinopril Swelling    Angioedema    Family History  Problem Relation Age of Onset  . Breast cancer Mother   . Hypertension Mother   . Colon cancer Father        died at 40  . Hypertension Brother      Prior to Admission medications   Medication Sig Start Date End Date Taking? Authorizing Provider  aspirin EC 81 MG tablet Take 81  mg by mouth daily.   Yes [provider]  carvedilol (COREG) 25 MG tablet TAKE 1 TABLET BY MOUTH TWICE DAILY WITH A MEAL Patient taking differently: Take 25 mg by mouth 2 (two) times daily with a meal.  02/19/19  Yes Mullis, Kiersten P, DO  furosemide (LASIX) 40 MG tablet Take 1 tablet (40 mg total) by mouth daily. 10/22/15  Yes [place] Bing, DO  RAYALDEE 30 MCG CPCR Take 30 mcg by mouth daily. 01/17/18  Yes [provider]  spironolactone (ALDACTONE) 100 MG tablet Take 100 mg by mouth daily. 11/30/17  Yes [provider]  hydrochlorothiazide  (HYDRODIURIL) 25 MG tablet TAKE ONE TABLET BY MOUTH  DAILY Patient not taking: Reported on 03/08/2019 03/24/15   Patrecia Pour, MD  HYDROcodone-acetaminophen (NORCO) 5-325 MG tablet Take 1 tablet by mouth every 6 (six) hours as needed for moderate pain. Patient not taking: Reported on 03/08/2019 11/06/18   Cliffton Asters, NP    Physical Exam: Vitals:   03/08/19 2001 03/08/19 2030 03/08/19 2215 03/08/19 2356  BP: (!) 131/99 133/76 131/83 (!) 143/88  Pulse: 66 63 66 66  Resp: 18  18 18   Temp:      SpO2: 100% 100% 98% 99%      Constitutional: NAD, chronically ill looking Vitals:   03/08/19 2001 03/08/19 2030 03/08/19 2215 03/08/19 2356  BP: (!) 131/99 133/76 131/83 (!) 143/88  Pulse: 66 63 66 66  Resp: 18  18 18   Temp:      SpO2: 100% 100% 98% 99%   Eyes: PERRL, lids and conjunctivae normal, periorbital edema ENMT: Mucous membranes are moist. Posterior pharynx clear of any exudate or lesions.Normal dentition.  Neck: normal, supple, no masses, no thyromegaly Respiratory: clear to auscultation bilaterally, no wheezing, no crackles. Normal respiratory effort. No accessory muscle use.  Cardiovascular: Regular rate and rhythm, no murmurs / rubs / gallops.  1+ pedal edema. 2+ pedal pulses. No carotid bruits.  Abdomen: no tenderness, no masses palpated. No hepatosplenomegaly. Bowel sounds positive.  Musculoskeletal: no clubbing / cyanosis. No joint deformity upper and lower extremities. Good ROM, no contractures. Normal muscle tone.  Skin: no rashes, lesions, ulcers. No induration Neurologic: CN 2-12 grossly intact. Sensation intact, DTR normal. Strength 5/5 in all 4.  Psychiatric: Normal judgment and insight. Alert and oriented x 3. Normal mood.     Labs on Admission: I have personally reviewed following labs and imaging studies  CBC: Recent Labs  Lab 03/08/19 2155  WBC 7.3  NEUTROABS 5.5  HGB 12.7*  HCT 40.7  MCV 89.3  PLT 509*   Basic Metabolic Panel: Recent  Labs  Lab 03/08/19 2155  NA 132*  K >7.5*  CL 111  CO2 9*  GLUCOSE 93  BUN 116*  CREATININE 5.42*  CALCIUM 9.6   GFR: CrCl cannot be calculated (Unknown ideal weight.). Liver Function Tests: No results for input(s): AST, ALT, ALKPHOS, BILITOT, PROT, ALBUMIN in the last 168 hours. No results for input(s): LIPASE, AMYLASE in the last 168 hours. No results for input(s): AMMONIA in the last 168 hours. Coagulation Profile: No results for input(s): INR, PROTIME in the last 168 hours. Cardiac Enzymes: No results for input(s): CKTOTAL, CKMB, CKMBINDEX, TROPONINI in the last 168 hours. BNP (last 3 results) No results for input(s): PROBNP in the last 8760 hours. HbA1C: No results for input(s): HGBA1C in the last 72 hours. CBG: No results for input(s): GLUCAP in the last 168 hours. Lipid Profile: No results for  input(s): CHOL, HDL, LDLCALC, TRIG, CHOLHDL, LDLDIRECT in the last 72 hours. Thyroid Function Tests: No results for input(s): TSH, T4TOTAL, FREET4, T3FREE, THYROIDAB in the last 72 hours. Anemia Panel: No results for input(s): VITAMINB12, FOLATE, FERRITIN, TIBC, IRON, RETICCTPCT in the last 72 hours. Urine analysis:    Component Value Date/Time   COLORURINE YELLOW 10/05/2015 0603   APPEARANCEUR TURBID (A) 10/05/2015 0603   LABSPEC 1.014 10/05/2015 0603   PHURINE 5.5 10/05/2015 0603   GLUCOSEU NEGATIVE 10/05/2015 0603   HGBUR LARGE (A) 10/05/2015 0603   BILIRUBINUR NEGATIVE 10/05/2015 0603   BILIRUBINUR NEG 09/12/2014 1623   KETONESUR NEGATIVE 10/05/2015 0603   PROTEINUR 100 (A) 10/05/2015 0603   UROBILINOGEN 0.2 09/12/2014 1623   UROBILINOGEN 2.0 (H) 11/08/2009 1705   NITRITE POSITIVE (A) 10/05/2015 0603   LEUKOCYTESUR LARGE (A) 10/05/2015 0603   Sepsis Labs: @LABRCNTIP (procalcitonin:4,lacticidven:4) )No results found for this or any previous visit (from the past 240 hour(s)).   Radiological Exams on Admission: DG Chest 2 View  Result Date: 03/08/2019 CLINICAL  DATA:  Shortness of breath EXAM: CHEST - 2 VIEW COMPARISON:  10/05/2015 FINDINGS: Patchy bilateral lower lobe airspace opacities compatible with pneumonia. Heart is normal size. No effusions or acute bony abnormality. IMPRESSION: Patchy bilateral lower lobe airspace disease compatible with pneumonia. Electronically Signed   By: Rolm Baptise M.D.   On: 03/08/2019 18:00    EKG: Independently reviewed.  It shows sinus rhythm with mildly prolonged QT and slightly peaked T waves with low voltage overall.  Assessment/Plan Principal Problem:   Hyperkalemia Active Problems:   Essential hypertension   Acute kidney injury superimposed on CKD ([place])     #1 hyperkalemia: Most likely secondary to worsening renal failure.  Patient is receiving bicarb as well as insulin and D50.  Nephrology consulted.  We may repeat lab work and may be given Kayexalate if necessary.  Will follow recommendations of nephrology.  #2 acute on chronic kidney disease stage IV: BUN and creatinine have worsened.  Gentle hydration and follow with nephrology.  Patient is headed towards hemodialysis.  #3 essential hypertension: Continue blood pressure control  #4 obstructive sleep apnea: We will offer CPAP if needed.   DVT prophylaxis: Heparin Code Status: Full code Family Communication: No family at bedside Disposition Plan: Home Consults called: Nephrology, Admission status: Inpatient  Severity of Illness: The appropriate patient status for this patient is INPATIENT. Inpatient status is judged to be reasonable and necessary in order to provide the required intensity of service to ensure the patient's safety. The patient's presenting symptoms, physical exam findings, and initial radiographic and laboratory data in the context of their chronic comorbidities is felt to place them at high risk for further clinical deterioration. Furthermore, it is not anticipated that the patient will be medically stable for discharge from the  hospital within 2 midnights of admission. The following factors support the patient status of inpatient.   " The patient's presenting symptoms include shortness of breath or leg swelling. " The worrisome physical exam findings include lower extremities. " The initial radiographic and laboratory data are worrisome because of potassium more than 7. " The chronic co-morbidities include chronic kidney disease stage IV.   * I certify that at the point of admission it is my clinical judgment that the patient will require inpatient hospital care spanning beyond 2 midnights from the point of admission due to high intensity of service, high risk for further deterioration and high frequency of surveillance required.Barbette Merino  MD [facility] Pager [phone]  If 7PM-7AM, please contact night-coverage [email or web address] Password [facility]  03/09/2019, 12:34 AM

## 2019-03-09 NOTE — ED Notes (Addendum)
Date and time results received: 03/09/19 6:01 AM  (use smartphrase ".now" to insert current time)  Test: Potassium Critical Value: 7.2  Name of Provider Notified: Dr. Maudie Mercury  Orders Received? Or Actions Taken?:

## 2019-03-09 NOTE — ED Notes (Signed)
Kayexalate held for patient transport. Medication was given to [facility] staff.

## 2019-03-09 NOTE — Plan of Care (Signed)

## 2019-03-09 NOTE — Consult Note (Signed)
[facility] Renal Consultation Note  Requesting MD: Aileen Fass Indication for Consultation: hyperkalemia and stage 4 /5 CKD  HPI:  Johnny [name] is a 59 y.o. male with PMhx significant for HTN, obesity with OSA and left ventricular hypertrophy, history of DVT/PE.  He also has CKD followed by Dr. Posey Pronto at [place] seen September 2020.  At that time he appeared to be doing well-  crt 4.75 and K of 5.9 on aldactone- he was treated transiently with veltassa -  Follow up K of 5.1. He presented to the ER at [facility] last evening with complaints of SOB and swelling (although pt suffers from chronic edema per Dr. Ena Dawley note)  He was found to be COVID positive.  Labs also remarkable for potassium of over 7.5-  BUN and crt 116 and 5.42.  He was transferred to [facility]- covid unit-  With medical management K is now down to 6.7-  BUN and crt 103 and 4.87.  He had been on aldactone as OP-- now held.  No UOP is recorded - no U/A.  BP has not been low, was given IVF-  Felt that his SOB was from [place].  I see him at approx 5 Pm on 1/15-  He says he feels great, much better.  Really the only intervention was IVF and kaexylate.  He tells me that he doesn't have edema- is on room air- getting 1/2 normal saline at 75 per hour.  Is making lots of urine per the patient and denies uremic sxms   Creat  Date/Time Value Ref Range Status  09/04/2015 10:35 AM 2.94 (H) 0.70 - 1.33 mg/dL Final    Comment:      For patients > or = 58 years of age: The upper reference limit for Creatinine is approximately 13% higher for people identified as African-American.     08/29/2015 11:18 AM 3.50 (H) 0.70 - 1.33 mg/dL Final    Comment:      For patients > or = 58 years of age: The upper reference limit for Creatinine is approximately 13% higher for people identified as African-American.     08/27/2015 02:26 PM 3.18 (H) 0.70 - 1.33 mg/dL Final    Comment:      For patients > or = 58 years of age: The upper reference  limit for Creatinine is approximately 13% higher for people identified as African-American.     08/22/2015 09:27 AM 3.96 (H) 0.70 - 1.33 mg/dL Final    Comment:      For patients > or = 58 years of age: The upper reference limit for Creatinine is approximately 13% higher for people identified as African-American.     03/13/2014 03:06 PM 2.52 (H) 0.50 - 1.35 mg/dL Final  09/05/2013 11:01 AM 2.02 (H) 0.50 - 1.35 mg/dL Final  04/25/2013 01:57 PM 1.73 (H) 0.50 - 1.35 mg/dL Final  03/13/2013 03:07 PM 1.59 (H) 0.50 - 1.35 mg/dL Final  12/02/2011 10:00 AM 1.88 (H) 0.50 - 1.35 mg/dL Final   Creatinine, Ser  Date/Time Value Ref Range Status  03/09/2019 10:00 AM 4.87 (H) 0.61 - 1.24 mg/dL Final  03/09/2019 05:17 AM 4.83 (H) 0.61 - 1.24 mg/dL Final  03/08/2019 09:55 PM 5.42 (H) 0.61 - 1.24 mg/dL Final  10/04/2018 10:36 AM 3.94 (H) 0.61 - 1.24 mg/dL Final  04/14/2018 03:58 PM 4.24 (H) 0.61 - 1.24 mg/dL Final  12/14/2017 01:54 AM 3.91 (H) 0.61 - 1.24 mg/dL Final  12/13/2017 05:41 AM 3.75 (H) 0.61 -  1.24 mg/dL Final  12/12/2017 01:54 PM 4.50 (H) 0.61 - 1.24 mg/dL Final  12/12/2017 12:00 PM 4.50 (H) 0.61 - 1.24 mg/dL Final  12/12/2017 12:00 PM 4.50 (H) 0.61 - 1.24 mg/dL Final  12/12/2017 11:52 AM 4.06 (H) 0.61 - 1.24 mg/dL Final  10/15/2016 11:14 AM 3.55 (H) 0.61 - 1.24 mg/dL Final  10/05/2015 03:18 AM 3.34 (H) 0.61 - 1.24 mg/dL Final  08/16/2015 10:25 AM 3.75 (H) 0.61 - 1.24 mg/dL Final  09/05/2014 07:27 PM 2.56 (H) 0.61 - 1.24 mg/dL Final  11/08/2009 03:55 AM 2.32 (H) 0.4 - 1.5 mg/dL Final  11/07/2009 11:43 PM 2.22 (H) 0.4 - 1.5 mg/dL Final  11/07/2009 05:51 PM 2.4 (H) 0.4 - 1.5 mg/dL Final  09/02/2009 03:30 PM 2.04 (H) 0.4 - 1.5 mg/dL Final  07/24/2009 08:14 PM 2.06 (H) 0.40 - 1.50 mg/dL Final    Comment:    See lab report for associated comment(s)  07/14/2009 08:48 PM 2.37 (H) 0.40 - 1.50 mg/dL Final    Comment:    See lab report for associated comment(s)  07/09/2009 06:45 PM 1.93  (H) 0.40 - 1.50 mg/dL Final  04/06/2007 11:01 PM 1.55 (H) 0.40 - 1.50 mg/dL Final     PMHx:   Past Medical History:  Diagnosis Date  . Acquired lymphedema 12/13/2017  . Chronic kidney disease    Chronic Kidney disease-stage unknown  . Diverticulosis of large intestine without hemorrhage 01/31/2012  . Essential HTN 04/21/2006  . Excess skin of thigh    left thigh adipose excess  . Hypertension   . OSA 05/21/2013   does not wear CPAP any longer  . 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 Wears dentures     Past Surgical History:  Procedure Laterality Date  . ABDOMINOPLASTY/PANNICULECTOMY WITH LIPOSUCTION Left 10/11/2018   Procedure: Excision of left thigh lipodystrophy;  Surgeon: Wallace Going, DO;  Location: [place];  Service: Plastics;  Laterality: Left;  . COLONOSCOPY  01/31/2012   Procedure: COLONOSCOPY;  Surgeon: Inda Castle, MD;  Location: [facility];  Service: Endoscopy;  Laterality: N/A;  bmi is 54  . MULTIPLE TOOTH EXTRACTIONS    . NO PAST SURGERIES    . PANNICULECTOMY Bilateral 04/20/2018   Procedure: Excision of right inner thighs;  Surgeon: Wallace Going, DO;  Location: [place];  Service: Plastics;  Laterality: Bilateral;  Excision of right inner thighs    Family Hx:  Family History  Problem Relation Age of Onset  . Breast cancer Mother   . Hypertension Mother   . Colon cancer Father        died at 51  . Hypertension Brother     Social History:  reports that he has never smoked. He has never used smokeless tobacco. He reports current alcohol use. He reports that he does not use drugs.  Allergies:  Allergies  Allergen Reactions  . Lisinopril Swelling    Angioedema    Medications: Prior to Admission medications   Medication Sig Start Date End Date Taking? Authorizing Provider  aspirin EC 81 MG tablet Take 81 mg by mouth daily.   Yes [provider]  carvedilol (COREG) 25 MG tablet TAKE 1 TABLET BY MOUTH TWICE DAILY WITH A MEAL Patient  taking differently: Take 25 mg by mouth 2 (two) times daily with a meal.  02/19/19  Yes Mullis, Kiersten P, DO  furosemide (LASIX) 40 MG tablet Take 1 tablet (40 mg total) by mouth daily. 10/22/15  Yes Gregory Bing, DO  RAYALDEE  30 MCG CPCR Take 30 mcg by mouth daily. 01/17/18  Yes [provider]  spironolactone (ALDACTONE) 100 MG tablet Take 100 mg by mouth daily. 11/30/17  Yes [provider]  hydrochlorothiazide (HYDRODIURIL) 25 MG tablet TAKE ONE TABLET BY MOUTH  DAILY Patient not taking: Reported on 03/08/2019 03/24/15   Patrecia Pour, MD  HYDROcodone-acetaminophen (NORCO) 5-325 MG tablet Take 1 tablet by mouth every 6 (six) hours as needed for moderate pain. Patient not taking: Reported on 03/08/2019 11/06/18   Dozier-Lineberger, Loleta Chance, NP    I have reviewed the patient's current medications.  Labs:  Results for orders placed or performed during the hospital encounter of 03/08/19 (from the past 48 hour(s))  CBC with Differential     Status: Abnormal   Collection Time: 03/08/19  9:55 PM  Result Value Ref Range   WBC 7.3 4.0 - 10.5 K/uL   RBC 4.56 4.22 - 5.81 MIL/uL   Hemoglobin 12.7 (L) 13.0 - 17.0 g/dL   HCT 40.7 39.0 - 52.0 %   MCV 89.3 80.0 - 100.0 fL   MCH 27.9 26.0 - 34.0 pg   MCHC 31.2 30.0 - 36.0 g/dL   RDW 15.9 (H) 11.5 - 15.5 %   Platelets 403 (H) 150 - 400 K/uL    Comment: REPEATED TO VERIFY   nRBC 0.0 0.0 - 0.2 %   Neutrophils Relative % 75 %   Neutro Abs 5.5 1.7 - 7.7 K/uL   Lymphocytes Relative 13 %   Lymphs Abs 1.0 0.7 - 4.0 K/uL   Monocytes Relative 9 %   Monocytes Absolute 0.6 0.1 - 1.0 K/uL   Eosinophils Relative 2 %   Eosinophils Absolute 0.1 0.0 - 0.5 K/uL   Basophils Relative 0 %   Basophils Absolute 0.0 0.0 - 0.1 K/uL   Immature Granulocytes 1 %   Abs Immature Granulocytes 0.06 0.00 - 0.07 K/uL    Comment: Performed at [facility], [place] [address] [place], [place] [address]  Basic metabolic panel     Status:  Abnormal   Collection Time: 03/08/19  9:55 PM  Result Value Ref Range   Sodium 132 (L) 135 - 145 mmol/L   Potassium >7.5 (HH) 3.5 - 5.1 mmol/L    Comment: CRITICAL RESULT CALLED TO, READ BACK BY AND VERIFIED WITH: REBECCA GOODWIN @ 2322 ON 03/08/19 C VARNER    Chloride 111 98 - 111 mmol/L   CO2 9 (L) 22 - 32 mmol/L   Glucose, Bld 93 70 - 99 mg/dL   BUN 116 (H) 6 - 20 mg/dL    Comment: RESULTS CONFIRMED BY MANUAL DILUTION   Creatinine, Ser 5.42 (H) 0.61 - 1.24 mg/dL   Calcium 9.6 8.9 - 10.3 mg/dL   GFR calc non Af Amer 11 (L) >60 mL/min   GFR calc Af Amer 12 (L) >60 mL/min   Anion gap 12 5 - 15    Comment: Performed at [facility], [place] [address] [place], [place] [address]  Troponin I (High Sensitivity)     Status: None   Collection Time: 03/08/19  9:55 PM  Result Value Ref Range   Troponin I (High Sensitivity) 4 <18 ng/L    Comment: (NOTE) Elevated high sensitivity troponin I (hsTnI) values and significant  changes across serial measurements may suggest ACS but many other  chronic and acute conditions are known to elevate hsTnI results.  Refer to the Links section for chest pain algorithms and additional  guidance.  Performed at [facility], [place] [address], [place], [place] [address]   D-dimer, quantitative (not at [facility])     Status: Abnormal   Collection Time: 03/08/19 10:41 PM  Result Value Ref Range   D-Dimer, Quant 2.31 (H) 0.00 - 0.50 ug/mL-FEU    Comment: (NOTE) At the manufacturer cut-off of 0.50 ug/mL FEU, this assay has been documented to exclude PE with a sensitivity and negative predictive value of 97 to 99%.  At this time, this assay has not been approved by the FDA to exclude DVT/VTE. Results should be correlated with clinical presentation. Performed at [facility], [place] [address], [place],  [address]   POC SARS Coronavirus 2 Ag-ED - Nasal Swab (BD Veritor Kit)     Status: None   Collection  Time: 03/08/19 10:44 PM  Result Value Ref Range   SARS Coronavirus 2 Ag NEGATIVE NEGATIVE    Comment: (NOTE) SARS-CoV-2 antigen NOT DETECTED.  Negative results are presumptive.  Negative results do not preclude SARS-CoV-2 infection and should not be used as the sole basis for treatment or other patient management decisions, including infection  control decisions, particularly in the presence of clinical signs and  symptoms consistent with COVID-19, or in those who have been in contact with the virus.  Negative results must be combined with clinical observations, patient history, and epidemiological information. The expected result is Negative. Fact Sheet for Patients: PodPark.tn Fact Sheet for Healthcare Providers: GiftContent.is This test is not yet approved or cleared by the [place] FDA and  has been authorized for detection and/or diagnosis of SARS-CoV-2 by FDA under an Emergency Use Authorization (EUA).  This EUA will remain in effect (meaning this test can be used) for the duration of  the COVID-19 de claration under Section 564(b)(1) of the Act, 21 U.S.C. section 360bbb-3(b)(1), unless the authorization is terminated or revoked sooner.   Respiratory Panel by RT PCR (Flu A&B, Covid) - Nasopharyngeal Swab     Status: Abnormal   Collection Time: 03/09/19 12:01 AM   Specimen: Nasopharyngeal Swab  Result Value Ref Range   SARS Coronavirus 2 by RT PCR POSITIVE (A) NEGATIVE    Comment: CRITICAL RESULT CALLED TO, READ BACK BY AND VERIFIED WITH: RN REBECCA AT 0410 03/09/19 CRUICKSHANK A (NOTE) SARS-CoV-2 target nucleic acids are DETECTED. SARS-CoV-2 RNA is generally detectable in upper respiratory specimens  during the acute phase of infection. Positive results are indicative of the presence of the identified virus, but do not rule out bacterial infection or co-infection with other pathogens not detected by the test.  Clinical correlation with patient history and other diagnostic information is necessary to determine patient infection status. The expected result is Negative. Fact Sheet for Patients:  PinkCheek.be Fact Sheet for Healthcare Providers: GravelBags.it This test is not yet approved or cleared by the [place] FDA and  has been authorized for detection and/or diagnosis of SARS-CoV-2 by FDA under an Emergency Use Authorization (EUA).  This EUA will remain in effect (meaning this test  can be used) for the duration of  the COVID-19 declaration under Section 564(b)(1) of the Act, 21 U.S.C. section 360bbb-3(b)(1), unless the authorization is terminated or revoked sooner.    Influenza A by PCR NEGATIVE NEGATIVE   Influenza B by PCR NEGATIVE NEGATIVE    Comment: (NOTE) The Xpert Xpress SARS-CoV-2/FLU/RSV assay is intended as an aid in  the diagnosis of influenza from Nasopharyngeal swab specimens and  should not be used as a  sole basis for treatment. Nasal washings and  aspirates are unacceptable for Xpert Xpress SARS-CoV-2/FLU/RSV  testing. Fact Sheet for Patients: PinkCheek.be Fact Sheet for Healthcare Providers: GravelBags.it This test is not yet approved or cleared by the [place] FDA and  has been authorized for detection and/or diagnosis of SARS-CoV-2 by  FDA under an Emergency Use Authorization (EUA). This EUA will remain  in effect (meaning this test can be used) for the duration of the  Covid-19 declaration under Section 564(b)(1) of the Act, 21  U.S.C. section 360bbb-3(b)(1), unless the authorization is  terminated or revoked. Performed at [facility], [place] [address] [place], [place] [address]   Troponin I (High Sensitivity)     Status: None   Collection Time: 03/09/19  3:11 AM  Result Value Ref Range   Troponin I (High Sensitivity) 4  <18 ng/L    Comment: (NOTE) Elevated high sensitivity troponin I (hsTnI) values and significant  changes across serial measurements may suggest ACS but many other  chronic and acute conditions are known to elevate hsTnI results.  Refer to the "Links" section for chest pain algorithms and additional  guidance. Performed at [facility], [place] [address], [place], [place] [address]   HIV Antibody (routine testing w rflx)     Status: None   Collection Time: 03/09/19  3:11 AM  Result Value Ref Range   HIV Screen 4th Generation wRfx NON REACTIVE NON REACTIVE    Comment: Performed at [place] [address] [place], [place] [address]  Renal function panel     Status: Abnormal   Collection Time: 03/09/19  5:17 AM  Result Value Ref Range   Sodium 134 (L) 135 - 145 mmol/L   Potassium 7.2 (HH) 3.5 - 5.1 mmol/L    Comment: CRITICAL RESULT CALLED TO, READ BACK BY AND VERIFIED WITH: REBECCA, RN @ 0600 ON 03/09/19 C VARNER    Chloride 111 98 - 111 mmol/L   CO2 15 (L) 22 - 32 mmol/L   Glucose, Bld 91 70 - 99 mg/dL   BUN 97 (H) 6 - 20 mg/dL    Comment: RESULTS CONFIRMED BY MANUAL DILUTION   Creatinine, Ser 4.83 (H) 0.61 - 1.24 mg/dL   Calcium 8.7 (L) 8.9 - 10.3 mg/dL   Phosphorus 4.9 (H) 2.5 - 4.6 mg/dL   Albumin 2.7 (L) 3.5 - 5.0 g/dL   GFR calc non Af Amer 12 (L) >60 mL/min   GFR calc Af Amer 14 (L) >60 mL/min   Anion gap 8 5 - 15    Comment: Performed at [facility], [place] [address], [place], [place] [address]  CBC     Status: Abnormal   Collection Time: 03/09/19  5:17 AM  Result Value Ref Range   WBC 5.6 4.0 - 10.5 K/uL   RBC 3.96 (L) 4.22 - 5.81 MIL/uL   Hemoglobin 11.2 (L) 13.0 - 17.0 g/dL   HCT 34.6 (L) 39.0 - 52.0 %   MCV 87.4 80.0 - 100.0 fL   MCH 28.3 26.0 - 34.0 pg   MCHC 32.4 30.0 - 36.0 g/dL   RDW 15.7 (H) 11.5 - 15.5 %   Platelets 351 150 - 400 K/uL   nRBC 0.0 0.0 - 0.2 %    Comment: Performed at [facility], [place] [address] [place], [place] [address]  Basic metabolic panel     Status: Abnormal   Collection Time: 03/09/19 10:00 AM  Result  Value Ref Range   Sodium 136 135 - 145 mmol/L   Potassium 6.7 (HH) 3.5 - 5.1 mmol/L    Comment: CRITICAL RESULT CALLED TO, READ BACK BY AND VERIFIED WITH: NO VISIBLE HEMOLYSIS HODGIN,K. RN @1157  03/09/19 BILLINGSLEY,L    Chloride 111 98 - 111 mmol/L   CO2 16 (L) 22 - 32 mmol/L   Glucose, Bld 110 (H) 70 - 99 mg/dL   BUN 103 (H) 6 - 20 mg/dL    Comment: RESULTS CONFIRMED BY MANUAL DILUTION   Creatinine, Ser 4.87 (H) 0.61 - 1.24 mg/dL   Calcium 9.0 8.9 - 10.3 mg/dL   GFR calc non Af Amer 12 (L) >60 mL/min   GFR calc Af Amer 14 (L) >60 mL/min   Anion gap 9 5 - 15    Comment: Performed at [facility], [place] [address], [place], [place] [address]     ROS:  A comprehensive review of systems was negative except for: was sob upon admit but now feels great  Physical Exam: Vitals:   03/09/19 1349 03/09/19 1440  BP: (!) 129/94 125/83  Pulse: 96 86  Resp: 17 (!) 23  Temp: 97.6 F (36.4 C) 97.8 F (36.6 C)  SpO2: 100% 98%     General: alert, talkative , NAD "I feel great" HEENT: PERRLA, EOMI, mucous membranes moist Neck: no JVD Heart: RRR Lungs: some scattered sounds Abdomen: soft, non tender Extremities: no peripheral edema Skin: warm and dry Neuro: alert and non focal  Assessment/Plan: 59 year old BM-  Know HTN and CKD- crt in the 4's followed by Posey Pronto.  He presents with COVID and mild A on CRF but with significant hyperkalemia 1.Renal- baseline crt in the 4's.  Now presents with mild A on CRF but mostly hyperkalemia.  Nonoliguric- does not appear uremic.  Potassium is coming down but still not normal with medical therapy.  I am hoping that the hyperkalemia is mostly due to his aldactone and will normalize off of it. Interesting that he now feels great.  Indicates that he is not uremic even with a BUN over 100  2.  Hyperkalemia-  Has received a couple of rounds of acute treatment. Last down to 6.7.  Will put him on scheduled lokelma and also a bicarb drip and stop the aldactone and follow-  This would be our only indication for RRT 3. Hypertension/volume  - really does not seem overloaded.  Now on 1/2 NS-  Will change to bicarb based fluids so sodium will not go down and to help with bicarb and potassium  4.  Metabolic acidosis -  Due to renal failure.  Give bicarb based fluids since not wet.   5.  Covid- per primary - not on specific treatment for it  that I can see-  Was given azithro and rocephin - did that lead to his improvement 6. Anemia  - not a major issue at this time   Louis Meckel 03/09/2019, 4:34 PM

## 2019-03-09 NOTE — ED Notes (Signed)
Pt was provided urinal for usage, pt voided in urinal.

## 2019-03-10 ENCOUNTER — Inpatient Hospital Stay (HOSPITAL_COMMUNITY): Payer: Medicaid Other

## 2019-03-10 DIAGNOSIS — N179 Acute kidney failure, unspecified: Secondary | ICD-10-CM

## 2019-03-10 DIAGNOSIS — I10 Essential (primary) hypertension: Secondary | ICD-10-CM

## 2019-03-10 DIAGNOSIS — J1282 Pneumonia due to coronavirus disease 2019: Secondary | ICD-10-CM

## 2019-03-10 DIAGNOSIS — U071 COVID-19: Principal | ICD-10-CM

## 2019-03-10 DIAGNOSIS — N189 Chronic kidney disease, unspecified: Secondary | ICD-10-CM

## 2019-03-10 LAB — C-REACTIVE PROTEIN: CRP: 6 mg/dL — ABNORMAL HIGH (ref <1.0)

## 2019-03-10 LAB — COMPREHENSIVE METABOLIC PANEL
ALT: 11 U/L (ref 0–44)
AST: 18 U/L (ref 15–41)
Albumin: 2.4 g/dL — ABNORMAL LOW (ref 3.5–5.0)
Alkaline Phosphatase: 40 U/L (ref 38–126)
Anion gap: 12 (ref 5–15)
BUN: 86 mg/dL — ABNORMAL HIGH (ref 6–20)
CO2: 19 mmol/L — ABNORMAL LOW (ref 22–32)
Calcium: 8.6 mg/dL — ABNORMAL LOW (ref 8.9–10.3)
Chloride: 103 mmol/L (ref 98–111)
Creatinine, Ser: 3.94 mg/dL — ABNORMAL HIGH (ref 0.61–1.24)
GFR calc Af Amer: 18 mL/min — ABNORMAL LOW (ref >60)
GFR calc non Af Amer: 16 mL/min — ABNORMAL LOW (ref >60)
Glucose, Bld: 173 mg/dL — ABNORMAL HIGH (ref 70–99)
Potassium: 4.3 mmol/L (ref 3.5–5.1)
Sodium: 134 mmol/L — ABNORMAL LOW (ref 135–145)
Total Bilirubin: 0.5 mg/dL (ref 0.3–1.2)
Total Protein: 7 g/dL (ref 6.5–8.1)

## 2019-03-10 LAB — CBC WITH DIFFERENTIAL/PLATELET
Abs Immature Granulocytes: 0.03 10*3/uL (ref 0.00–0.07)
Basophils Absolute: 0 10*3/uL (ref 0.0–0.1)
Basophils Relative: 0 %
Eosinophils Absolute: 0 10*3/uL (ref 0.0–0.5)
Eosinophils Relative: 1 %
HCT: 31 % — ABNORMAL LOW (ref 39.0–52.0)
Hemoglobin: 10.3 g/dL — ABNORMAL LOW (ref 13.0–17.0)
Immature Granulocytes: 1 %
Lymphocytes Relative: 13 %
Lymphs Abs: 0.7 10*3/uL (ref 0.7–4.0)
MCH: 27.8 pg (ref 26.0–34.0)
MCHC: 33.2 g/dL (ref 30.0–36.0)
MCV: 83.6 fL (ref 80.0–100.0)
Monocytes Absolute: 0.5 10*3/uL (ref 0.1–1.0)
Monocytes Relative: 9 %
Neutro Abs: 4.2 10*3/uL (ref 1.7–7.7)
Neutrophils Relative %: 76 %
Platelets: 347 10*3/uL (ref 150–400)
RBC: 3.71 MIL/uL — ABNORMAL LOW (ref 4.22–5.81)
RDW: 15.2 % (ref 11.5–15.5)
WBC: 5.5 10*3/uL (ref 4.0–10.5)
nRBC: 0 % (ref 0.0–0.2)

## 2019-03-10 LAB — GLUCOSE, CAPILLARY: Glucose-Capillary: 200 mg/dL — ABNORMAL HIGH (ref 70–99)

## 2019-03-10 LAB — PROCALCITONIN: Procalcitonin: 0.18 ng/mL

## 2019-03-10 LAB — PHOSPHORUS: Phosphorus: 4.1 mg/dL (ref 2.5–4.6)

## 2019-03-10 LAB — SEDIMENTATION RATE: Sed Rate: 95 mm/hr — ABNORMAL HIGH (ref 0–16)

## 2019-03-10 LAB — FERRITIN: Ferritin: 389 ng/mL — ABNORMAL HIGH (ref 24–336)

## 2019-03-10 LAB — LACTATE DEHYDROGENASE: LDH: 133 U/L (ref 98–192)

## 2019-03-10 LAB — D-DIMER, QUANTITATIVE: D-Dimer, Quant: 2.48 ug/mL-FEU — ABNORMAL HIGH (ref 0.00–0.50)

## 2019-03-10 LAB — FIBRINOGEN: Fibrinogen: 758 mg/dL — ABNORMAL HIGH (ref 210–475)

## 2019-03-10 LAB — MAGNESIUM: Magnesium: 1.6 mg/dL — ABNORMAL LOW (ref 1.7–2.4)

## 2019-03-10 MED ORDER — ALBUTEROL SULFATE HFA 108 (90 BASE) MCG/ACT IN AERS
1.0000 | INHALATION_SPRAY | RESPIRATORY_TRACT | Status: DC | PRN
  Filled 2019-03-10: qty 6.7

## 2019-03-10 MED ORDER — DEXAMETHASONE SODIUM PHOSPHATE 10 MG/ML IJ SOLN
6.0000 mg | INTRAMUSCULAR | Status: DC
  Administered 2019-03-10 – 2019-03-11 (×2): 6 mg via INTRAVENOUS
  Filled 2019-03-10 (×2): qty 1

## 2019-03-10 MED ORDER — DEXAMETHASONE SODIUM PHOSPHATE 10 MG/ML IJ SOLN: 6.0000 mg | Freq: Every day | INTRAMUSCULAR | Status: DC

## 2019-03-10 MED ORDER — VITAMIN C 500 MG/5ML PO SYRP
500.0000 mg | ORAL_SOLUTION | Freq: Every day | ORAL | Status: DC
  Administered 2019-03-10: 22:00:00 500 mg via ORAL
  Filled 2019-03-10 (×2): qty 5

## 2019-03-10 MED ORDER — SODIUM CHLORIDE 0.9 % IV SOLN
200.0000 mg | Freq: Once | INTRAVENOUS | Status: AC
  Administered 2019-03-10: 16:00:00 200 mg via INTRAVENOUS
  Filled 2019-03-10: qty 200

## 2019-03-10 MED ORDER — GUAIFENESIN-DM 100-10 MG/5ML PO SYRP: 10.0000 mL | ORAL_SOLUTION | ORAL | Status: DC | PRN

## 2019-03-10 MED ORDER — MAGNESIUM SULFATE IN D5W 1-5 GM/100ML-% IV SOLN
1.0000 g | Freq: Once | INTRAVENOUS | Status: AC
  Administered 2019-03-10: 1 g via INTRAVENOUS
  Filled 2019-03-10: qty 100

## 2019-03-10 MED ORDER — SODIUM CHLORIDE 0.9 % IV SOLN
100.0000 mg | Freq: Every day | INTRAVENOUS | Status: DC
  Administered 2019-03-11 – 2019-03-12 (×2): 100 mg via INTRAVENOUS
  Filled 2019-03-10 (×2): qty 20

## 2019-03-10 MED ORDER — ZINC SULFATE 220 (50 ZN) MG PO CAPS
220.0000 mg | ORAL_CAPSULE | Freq: Every day | ORAL | Status: DC
  Administered 2019-03-10 – 2019-03-12 (×3): 220 mg via ORAL
  Filled 2019-03-10 (×3): qty 1

## 2019-03-10 MED ORDER — HYDROCOD POLST-CPM POLST ER 10-8 MG/5ML PO SUER: 5.0000 mL | Freq: Two times a day (BID) | ORAL | Status: DC | PRN

## 2019-03-10 NOTE — Progress Notes (Signed)
Subjective:  Pt not physically seen today -  For some reason labs have not been done since yesterday morning even though potassium checks were supposed to be q 4 hours.  There are no chart notes that indicate any events.  I last saw him at around 5 PM he felt "great".   At least 700 of UOP  There are no labs past yesterday AM in spite of potassium being ordered q 4 hours ??? I have attempted to call over to 5W times 2 with no answer-  Also attempted to call pt room- no answer-  I will cont to contact and look for evidence that labs were drawn or resulted     Objective Vital signs in last 24 hours: Vitals:   03/10/19 0533 03/10/19 0539 03/10/19 0544 03/10/19 0948  BP:  118/84 118/84 119/87  Pulse:  85 86 84  Resp: (!) 21 20  (!) 22  Temp:  98.6 F (37 C)  97.7 F (36.5 C)  TempSrc:  Oral  Oral  SpO2:  97%  96%  Weight: 135 kg     Height:       Weight change:   Intake/Output Summary (Last 24 hours) at 03/10/2019 1052 Last data filed at 03/10/2019 0957 Gross per 24 hour  Intake 2793 ml  Output 1001 ml  Net 1792 ml    Assessment/Plan: 59 year old BM-  Know HTN and CKD- crt in the 4's followed by Posey Pronto.  He presents with COVID and mild A on CRF but with significant hyperkalemia 1.Renal- baseline crt in the 4's.  Now presents with mild A on CRF but mostly hyperkalemia.  Nonoliguric- does not appear uremic.  Potassium is coming down but still not normal with medical therapy.  I am hoping that the hyperkalemia is mostly due to his aldactone and will normalize off of it. Interesting that he now feels great.  Indicates that he is not uremic even with a BUN over 100  2. Hyperkalemia-  Has received a couple of rounds of acute treatment. Last down to 6.7.   put on scheduled lokelma and also a bicarb drip and stop the aldactone and follow-  This would be our only indication for RRT 3. Hypertension/volume  - really does not seem overloaded.  was on 1/2 NS-   changed to bicarb based fluids so sodium  will not go down and to help with bicarb and potassium  4.  Metabolic acidosis -  Due to renal failure.  Give bicarb based fluids since not wet.   5.  Covid- per primary - not on specific treatment for it  that I can see-  Was given azithro and rocephin - did that lead to his improvement ? 6. Anemia  - not a major issue at this time     [place]: Basic Metabolic Panel: Recent Labs  Lab 03/08/19 2155 03/09/19 0517 03/09/19 1000  NA 132* 134* 136  K >7.5* 7.2* 6.7*  CL 111 111 111  CO2 9* 15* 16*  GLUCOSE 93 91 110*  BUN 116* 97* 103*  CREATININE 5.42* 4.83* 4.87*  CALCIUM 9.6 8.7* 9.0  PHOS  --  4.9*  --    Liver Function Tests: Recent Labs  Lab 03/09/19 0517  ALBUMIN 2.7*   No results for input(s): LIPASE, AMYLASE in the last 168 hours. No results for input(s): AMMONIA in the last 168 hours. CBC: Recent Labs  Lab 03/08/19 2155 03/09/19 0517  WBC  7.3 5.6  NEUTROABS 5.5  --   HGB 12.7* 11.2*  HCT 40.7 34.6*  MCV 89.3 87.4  PLT 403* 351   Cardiac Enzymes: No results for input(s): CKTOTAL, CKMB, CKMBINDEX, TROPONINI in the last 168 hours. CBG: No results for input(s): GLUCAP in the last 168 hours.  Iron Studies: No results for input(s): IRON, TIBC, TRANSFERRIN, FERRITIN in the last 72 hours. Studies/Results: DG Chest 2 View  Result Date: 03/08/2019 CLINICAL DATA:  Shortness of breath EXAM: CHEST - 2 VIEW COMPARISON:  10/05/2015 FINDINGS: Patchy bilateral lower lobe airspace opacities compatible with pneumonia. Heart is normal size. No effusions or acute bony abnormality. IMPRESSION: Patchy bilateral lower lobe airspace disease compatible with pneumonia. Electronically Signed   By: Rolm Baptise M.D.   On: 03/08/2019 18:00   DG CHEST PORT 1 VIEW  Result Date: 03/10/2019 CLINICAL DATA:  Shortness of breath. Patient recently tested positive for COVID-19. EXAM: PORTABLE CHEST 1 VIEW COMPARISON:  Two-view chest x-ray 03/08/2019 FINDINGS: Heart  size is normal. Lung volumes are low. Patchy bibasilar airspace opacities have increased slightly. IMPRESSION: Slight increase in patchy bibasilar airspace disease. This is concerning for pneumonia. Electronically Signed   By: San Morelle M.D.   On: 03/10/2019 10:23   Medications: Infusions: .  sodium bicarbonate (isotonic) infusion in sterile water 75 mL/hr at 03/10/19 8299    Scheduled Medications: . albuterol  10 mg Nebulization Once  . heparin  5,000 Units Subcutaneous Q8H  . sodium zirconium cyclosilicate  10 g Oral BID    have reviewed scheduled and prn medications.  Physical Exam: I did not physically see the patient today.  Yesterday evening he looked good.  Awaiting repeat labs.  Attempted to call pt room- no answer   03/10/2019,10:52 AM  LOS: 1 day

## 2019-03-10 NOTE — Progress Notes (Signed)
PROGRESS NOTE    Johnny [name]  OEH:[id number] DOB: 1960-06-10 DOA: 03/08/2019 PCP: Danna Hefty, DO   Brief Narrative:  HPI per Dr. Gala Romney on 03/08/2018 Johnny [name] is a 59 y.o. male with medical history significant of chronic kidney disease stage IV being followed by nephrology, history of LVH, diverticulosis, obstructive sleep apnea, hypertension, and acquired lymphedema who came in with progressive shortness of breath with exertion which has been going on for a few months but got worse in the last day or 2.  Patient also reported worsening swelling.  He is still making some urine.  But noted not as much as he used to.  He is also felt weak and dizzy.  He was seen in the ER and evaluated.  Potassium is more than 7.5 and BUN and creatinine have gotten worse.  Patient is being admitted with significant hyperkalemia and acute on chronic kidney failure.  He is denying any open sores in NSAID use.  No recent nausea vomiting or diarrhea.  He appears dehydrated however..  ED Course: Temperature is 97.7 blood pressure 143/88 pulse 75 respiratory 24 and oxygen sat 98% on room air.  White count 7.3 hemoglobin 12.7 and platelets 403.  Sodium 132 potassium more than 7.5 chloride 111 CO2 of 9 BUN 169 creatinine 5.42.  Calcium 9.6.  Creatinine was 3.94 in August of last year.  Patient is being admitted to the hospital for treatment.  He has received calcium carbonate as well as insulin and D50 in the ER.  **Interim History She tested positive for coronavirus and was found to have a pneumonia.  He was initiated on treatment with remdesivir and steroids and his inflammatory markers were elevated.  His renal function is improving with fluid hydration and his potassium is now normalized after several doses of treatment of Kayexalate and the measures taken.  Assessment & Plan:   Principal Problem:   Hyperkalemia Active Problems:   Essential hypertension   Acute kidney injury superimposed on CKD  ([place])   Pneumonia 2/2 to COVID-19 Disease -Admitted to Inpatient Medical Telemetry due to Renal Fxn -Initial CXR showed "Patchy bilateral lower lobe airspace disease compatible with Pneumonia." -Repeat CXR showed "Slight increase in patchy bibasilar airspace disease. This is concerning for pneumonia." -Checked Baseline Inflammatory Markers and Trend Daily: Recent Labs    03/08/19 2241 03/10/19 1159  DDIMER 2.31* 2.48*  FERRITIN  --  389*  LDH  --  133  CRP  --  6.0*  -ESR was 95 -Fbirinogen was 758 -Check PCT and Blood Cx; PCT likely to be elevated in the setting of Renal Dysfunction Lab Results  Component Value Date   SARSCOV2NAA POSITIVE (A) 03/09/2019   [place] NEGATIVE 10/07/2018  -SpO2: 99 % -Started Treatment with 5 Days of Remdesivir and 10 Days of IV Decadron; Day 1/5 of Remdesivir and Day 1/10 of Steroids -C/w Antitussives with Guaifenesin-Dextromethorphan 10 mL po q4hprn Cough and Chlorpheniramine-Hydrocodone 5 mL po q12hprn -Check Blood Cx x2 and showed NGTD at 5 Days -Initially received IV ceftriaxone and azithromycin in the ED -Prone as much as possible  -C/w Zinc 220 mg po Daily and Vitamin C  -Airborne and Contact Precautions -C/w Albuterol 1-2 puff IH q4hprn -Add Flutter Valve and Incentive Spirometry  -Continue to monitor and trend inflammatory markers and repeat chest x-ray imaging in the a.m.  Hyperkalemia -Likely secondary to worsening renal failure, the patient did receive insulin D50, and oral Kayexalate. -Was Transferred to [facility] His twelve-lead EKG showed  peaked T waves no changes in interval, no bradycardia. -Given  Kayexalate every 4 hours.   -Given IV fluids will give him IV Lasix to try to bring down his potassium fluid overload. Now just on IVF  -He does not have a fistula, as when he saw Dr. Doren Custard as an outpatient he refused. -Improved significantly as his potassium went from greater than 7.5 and is now 4.3 -Endo monitor and  trend -Repeat CMP in the morning  Acute Kidney injury on chronic kidney disease stage IV-V: -With a baseline creatinine of 3.9-4.2. -On admission 5.2, he was started on gentle IV fluid hydration his creatinine is marginally improved, will continue IV fluids with close monitoring. -Renal has Held his Aldactone -Not Uremic  -Improving -C/w Sodium Bicarbonate 150 mEQ IV at 75 mL/hr -BUN/Cr went from -Avoid nephrotoxic medications, contrast dyes, hypotension, and renally adjust medications -Continue to monitor and trend renal function  -Repeat CMP in a.m.  Metabolic Acidosis -Likely due to renal disease -Improving.  Patient's CO2 went from 9, chloride level of 111, and anion gap of 12 and improved to a CO2 of 19, anion gap of 12, and a chloride level 103 -Continue sodium bicarbonate infusion as above  -Nephrology following appreciate further evaluation recommendations  Hyponatremia -Patient sodium is now 134  -Continue monitor and trend and continue with IV fluid hydration as above  -Repeat CMP in a.m.  Essential Hypertension -BP is now 123/76 -Given IV Lasix yesterday -His Aldactone has been stopped  -Continue to Monitor BP per Protocol  Hypomagnesemia -Patient's Mag Level was 1.6 -Replete with IV Mag Sulfate 2 grams -Continue to Monitor and Replete as Necessary -Repeat Mag Level in AM   Normocytic Anemia/Anemia of Chronic Kidney Disease -Patient's hemoglobin/hematocrit went from 12.7/40.7 is now 10.3/31.0 -Check anemia panel in a.m. -Continue to monitor for signs and symptoms of bleeding; currently no overt bleeding noted -Repeat CBC in a.m.  Morbid Obesity -Estimated body mass index is 43.95 kg/m as calculated from the following:   Height as of this encounter: 5' 9"  (1.753 m).   Weight as of this encounter: 135 kg. -Weight Loss and Dietary Counseling given   DVT prophylaxis: Heparin 5,000 units sq q8h  Code Status: FULL CODE  Family Communication: No family  present at bedside  Disposition Plan: Pending clinical improvement of his Renal Fxn and Inflammatory Markers  Consultants:   Nephrology    Procedures: None   Antimicrobials:  Anti-infectives (From admission, onward)   Start     Dose/Rate Route Frequency Ordered Stop   03/11/19 1000  remdesivir 100 mg in sodium chloride 0.9 % 100 mL IVPB     100 mg 200 mL/hr over 30 Minutes Intravenous Daily 03/10/19 1520 03/15/19 0959   03/10/19 1530  remdesivir 200 mg in sodium chloride 0.9% 250 mL IVPB     200 mg 580 mL/hr over 30 Minutes Intravenous Once 03/10/19 1520 03/10/19 1729   03/09/19 0030  cefTRIAXone (ROCEPHIN) 1 g in sodium chloride 0.9 % 100 mL IVPB     1 g 200 mL/hr over 30 Minutes Intravenous  Once 03/08/19 2335 03/09/19 0257   03/08/19 2345  azithromycin (ZITHROMAX) 500 mg in sodium chloride 0.9 % 250 mL IVPB     500 mg 250 mL/hr over 60 Minutes Intravenous  Once 03/08/19 2335 03/09/19 0216     Subjective: Seen and examined at bedside and states that he is feeling better.  Denies any chest pain, lightheadedness or dizziness.  No nausea or vomiting.  No shortness of breath currently.  No other concerns or complaints at this time.   Objective: Vitals:   03/10/19 0948 03/10/19 1141 03/10/19 1200 03/10/19 1619  BP: 119/87 122/82  123/76  Pulse: 84 92  88  Resp: (!) 22  19 20   Temp: 97.7 F (36.5 C) 98.3 F (36.8 C)  98.3 F (36.8 C)  TempSrc: Oral Oral  Oral  SpO2: 96% 99%  99%  Weight:      Height:        Intake/Output Summary (Last 24 hours) at 03/10/2019 1740 Last data filed at 03/10/2019 1531 Gross per 24 hour  Intake 1377.93 ml  Output 1225 ml  Net 152.93 ml   Filed Weights   03/09/19 1445 03/10/19 0533  Weight: 134.7 kg 135 kg   Examination: Physical Exam:  Constitutional: WN/WD morbidly obese African-American male currently in no acute distress appears calm watching television Eyes: Lids and conjunctivae normal, sclerae anicteric  ENMT: External Ears,  Nose appear normal. Grossly normal hearing. Mucous membranes are moist.   Neck: Appears normal, supple, no cervical masses, normal ROM, no appreciable thyromegaly neck: No JVD Respiratory: Diminished to auscultation bilaterally with coarse breath sounds, no wheezing, rales, rhonchi or crackles. Normal respiratory effort and patient is not tachypenic. No accessory muscle use.  Unlabored breathing Cardiovascular: RRR, no murmurs / rubs / gallops. S1 and S2 auscultated.  Slight extremity edema.  Abdomen: Soft, non-tender, distended secondary body habitus. Bowel sounds positive x4.  GU: Deferred. Musculoskeletal: No clubbing / cyanosis of digits/nails.Normal strength and muscle tone.  Skin: No rashes, lesions, ulcers on limited skin evaluation. No induration; Warm and dry.  Neurologic: CN 2-12 grossly intact with no focal deficits. Romberg sign and cerebellar reflexes not assessed.  Psychiatric: Normal judgment and insight. Alert and oriented x 3. Normal mood and appropriate affect.   Data Reviewed: I have personally reviewed following labs and imaging studies  CBC: Recent Labs  Lab 03/08/19 2155 03/09/19 0517 03/10/19 1159  WBC 7.3 5.6 5.5  NEUTROABS 5.5  --  4.2  HGB 12.7* 11.2* 10.3*  HCT 40.7 34.6* 31.0*  MCV 89.3 87.4 83.6  PLT 403* 351 735   Basic Metabolic Panel: Recent Labs  Lab 03/08/19 2155 03/09/19 0517 03/09/19 1000 03/10/19 1159  NA 132* 134* 136 134*  K >7.5* 7.2* 6.7* 4.3  CL 111 111 111 103  CO2 9* 15* 16* 19*  GLUCOSE 93 91 110* 173*  BUN 116* 97* 103* 86*  CREATININE 5.42* 4.83* 4.87* 3.94*  CALCIUM 9.6 8.7* 9.0 8.6*  MG  --   --   --  1.6*  PHOS  --  4.9*  --  4.1   GFR: Estimated Creatinine Clearance: 28.2 mL/min (A) (by C-G formula based on SCr of 3.94 mg/dL (H)). Liver Function Tests: Recent Labs  Lab 03/09/19 0517 03/10/19 1159  AST  --  18  ALT  --  11  ALKPHOS  --  40  BILITOT  --  0.5  PROT  --  7.0  ALBUMIN 2.7* 2.4*   No results for  input(s): LIPASE, AMYLASE in the last 168 hours. No results for input(s): AMMONIA in the last 168 hours. Coagulation Profile: No results for input(s): INR, PROTIME in the last 168 hours. Cardiac Enzymes: No results for input(s): CKTOTAL, CKMB, CKMBINDEX, TROPONINI in the last 168 hours. BNP (last 3 results) No results for input(s): PROBNP in the last 8760 hours. HbA1C: No results for input(s): HGBA1C in the last 72 hours.  CBG: No results for input(s): GLUCAP in the last 168 hours. Lipid Profile: No results for input(s): CHOL, HDL, LDLCALC, TRIG, CHOLHDL, LDLDIRECT in the last 72 hours. Thyroid Function Tests: No results for input(s): TSH, T4TOTAL, FREET4, T3FREE, THYROIDAB in the last 72 hours. Anemia Panel: Recent Labs    03/10/19 1159  FERRITIN 389*   Sepsis Labs: No results for input(s): PROCALCITON, LATICACIDVEN in the last 168 hours.  Recent Results (from the past 240 hour(s))  Respiratory Panel by RT PCR (Flu A&B, Covid) - Nasopharyngeal Swab     Status: Abnormal   Collection Time: 03/09/19 12:01 AM   Specimen: Nasopharyngeal Swab  Result Value Ref Range Status   SARS Coronavirus 2 by RT PCR POSITIVE (A) NEGATIVE Final    Comment: CRITICAL RESULT CALLED TO, READ BACK BY AND VERIFIED WITH: RN REBECCA AT 0410 03/09/19 CRUICKSHANK A (NOTE) SARS-CoV-2 target nucleic acids are DETECTED. SARS-CoV-2 RNA is generally detectable in upper respiratory specimens  during the acute phase of infection. Positive results are indicative of the presence of the identified virus, but do not rule out bacterial infection or co-infection with other pathogens not detected by the test. Clinical correlation with patient history and other diagnostic information is necessary to determine patient infection status. The expected result is Negative. Fact Sheet for Patients:  PinkCheek.be Fact Sheet for Healthcare Providers: GravelBags.it This  test is not yet approved or cleared by the [place] FDA and  has been authorized for detection and/or diagnosis of SARS-CoV-2 by FDA under an Emergency Use Authorization (EUA).  This EUA will remain in effect (meaning this test  can be used) for the duration of  the COVID-19 declaration under Section 564(b)(1) of the Act, 21 U.S.C. section 360bbb-3(b)(1), unless the authorization is terminated or revoked sooner.    Influenza A by PCR NEGATIVE NEGATIVE Final   Influenza B by PCR NEGATIVE NEGATIVE Final    Comment: (NOTE) The Xpert Xpress SARS-CoV-2/FLU/RSV assay is intended as an aid in  the diagnosis of influenza from Nasopharyngeal swab specimens and  should not be used as a sole basis for treatment. Nasal washings and  aspirates are unacceptable for Xpert Xpress SARS-CoV-2/FLU/RSV  testing. Fact Sheet for Patients: PinkCheek.be Fact Sheet for Healthcare Providers: GravelBags.it This test is not yet approved or cleared by the [place] FDA and  has been authorized for detection and/or diagnosis of SARS-CoV-2 by  FDA under an Emergency Use Authorization (EUA). This EUA will remain  in effect (meaning this test can be used) for the duration of the  Covid-19 declaration under Section 564(b)(1) of the Act, 21  U.S.C. section 360bbb-3(b)(1), unless the authorization is  terminated or revoked. Performed at [facility], [place] [address], [place], [place] [address]     Radiology Studies: DG Chest 2 View  Result Date: 03/08/2019 CLINICAL DATA:  Shortness of breath EXAM: CHEST - 2 VIEW COMPARISON:  10/05/2015 FINDINGS: Patchy bilateral lower lobe airspace opacities compatible with pneumonia. Heart is normal size. No effusions or acute bony abnormality. IMPRESSION: Patchy bilateral lower lobe airspace disease compatible with pneumonia. Electronically Signed   By: Rolm Baptise M.D.   On: 03/08/2019 18:00   DG  CHEST PORT 1 VIEW  Result Date: 03/10/2019 CLINICAL DATA:  Shortness of breath. Patient recently tested positive for COVID-19. EXAM: PORTABLE CHEST 1 VIEW COMPARISON:  Two-view chest x-ray 03/08/2019 FINDINGS: Heart size is normal. Lung volumes are low. Patchy bibasilar airspace opacities have increased slightly. IMPRESSION: Slight increase in patchy  bibasilar airspace disease. This is concerning for pneumonia. Electronically Signed   By: San Morelle M.D.   On: 03/10/2019 10:23   Scheduled Meds: . albuterol  10 mg Nebulization Once  . dexamethasone (DECADRON) injection  6 mg Intravenous Q24H  . heparin  5,000 Units Subcutaneous Q8H  . sodium zirconium cyclosilicate  10 g Oral BID   Continuous Infusions: . [START ON 03/11/2019] remdesivir 100 mg in NS 100 mL    .  sodium bicarbonate (isotonic) infusion in sterile water 75 mL/hr at 03/10/19 4114    LOS: 1 day   Kerney Elbe, DO [facility] PAGER is on [place]  If 7PM-7AM, please contact night-coverage [email or web address]

## 2019-03-10 NOTE — Progress Notes (Signed)
Pharmacy Consult - Remdesivir  89 yom presenting COVID-19 positive with respiratory symptoms requiring hospitalization. Pharmacy consulted to dose Remdesivir. ALT<220.  Plan: Remdesivir 200mg  IV x 1; then 100mg  IV q24h to complete 5 total doses Monitor clinical progress, ALT  Antonietta Jewel, PharmD, BCCCP Clinical Pharmacist  Phone: [phone]  Please check AMION for all [place] phone numbers After 10:00 PM, call [place] [phone] 03/10/2019 3:19 PM

## 2019-03-11 ENCOUNTER — Inpatient Hospital Stay (HOSPITAL_COMMUNITY): Payer: Medicaid Other

## 2019-03-11 LAB — PROCALCITONIN: Procalcitonin: 0.16 ng/mL

## 2019-03-11 LAB — CBC WITH DIFFERENTIAL/PLATELET
Abs Immature Granulocytes: 0.02 10*3/uL (ref 0.00–0.07)
Basophils Absolute: 0 10*3/uL (ref 0.0–0.1)
Basophils Relative: 0 %
Eosinophils Absolute: 0 10*3/uL (ref 0.0–0.5)
Eosinophils Relative: 0 %
HCT: 29.7 % — ABNORMAL LOW (ref 39.0–52.0)
Hemoglobin: 10.3 g/dL — ABNORMAL LOW (ref 13.0–17.0)
Immature Granulocytes: 1 %
Lymphocytes Relative: 8 %
Lymphs Abs: 0.3 10*3/uL — ABNORMAL LOW (ref 0.7–4.0)
MCH: 28.3 pg (ref 26.0–34.0)
MCHC: 34.7 g/dL (ref 30.0–36.0)
MCV: 81.6 fL (ref 80.0–100.0)
Monocytes Absolute: 0.1 10*3/uL (ref 0.1–1.0)
Monocytes Relative: 3 %
Neutro Abs: 3.4 10*3/uL (ref 1.7–7.7)
Neutrophils Relative %: 88 %
Platelets: 377 10*3/uL (ref 150–400)
RBC: 3.64 MIL/uL — ABNORMAL LOW (ref 4.22–5.81)
RDW: 14.8 % (ref 11.5–15.5)
WBC: 3.8 10*3/uL — ABNORMAL LOW (ref 4.0–10.5)
nRBC: 0 % (ref 0.0–0.2)

## 2019-03-11 LAB — COMPREHENSIVE METABOLIC PANEL
ALT: 13 U/L (ref 0–44)
AST: 16 U/L (ref 15–41)
Albumin: 2.3 g/dL — ABNORMAL LOW (ref 3.5–5.0)
Alkaline Phosphatase: 39 U/L (ref 38–126)
Anion gap: 10 (ref 5–15)
BUN: 75 mg/dL — ABNORMAL HIGH (ref 6–20)
CO2: 21 mmol/L — ABNORMAL LOW (ref 22–32)
Calcium: 8.7 mg/dL — ABNORMAL LOW (ref 8.9–10.3)
Chloride: 103 mmol/L (ref 98–111)
Creatinine, Ser: 3.45 mg/dL — ABNORMAL HIGH (ref 0.61–1.24)
GFR calc Af Amer: 22 mL/min — ABNORMAL LOW (ref >60)
GFR calc non Af Amer: 19 mL/min — ABNORMAL LOW (ref >60)
Glucose, Bld: 161 mg/dL — ABNORMAL HIGH (ref 70–99)
Potassium: 4.8 mmol/L (ref 3.5–5.1)
Sodium: 134 mmol/L — ABNORMAL LOW (ref 135–145)
Total Bilirubin: 0.5 mg/dL (ref 0.3–1.2)
Total Protein: 7 g/dL (ref 6.5–8.1)

## 2019-03-11 LAB — IRON AND TIBC
Iron: 43 ug/dL — ABNORMAL LOW (ref 45–182)
Saturation Ratios: 24 % (ref 17.9–39.5)
TIBC: 182 ug/dL — ABNORMAL LOW (ref 250–450)
UIBC: 139 ug/dL

## 2019-03-11 LAB — RETICULOCYTES
Immature Retic Fract: 11.1 % (ref 2.3–15.9)
RBC.: 3.6 MIL/uL — ABNORMAL LOW (ref 4.22–5.81)
Retic Count, Absolute: 47.9 10*3/uL (ref 19.0–186.0)
Retic Ct Pct: 1.3 % (ref 0.4–3.1)

## 2019-03-11 LAB — FIBRINOGEN: Fibrinogen: 769 mg/dL — ABNORMAL HIGH (ref 210–475)

## 2019-03-11 LAB — LACTATE DEHYDROGENASE: LDH: 120 U/L (ref 98–192)

## 2019-03-11 LAB — VITAMIN B12: Vitamin B-12: 506 pg/mL (ref 180–914)

## 2019-03-11 LAB — C-REACTIVE PROTEIN: CRP: 5.5 mg/dL — ABNORMAL HIGH (ref <1.0)

## 2019-03-11 LAB — FERRITIN: Ferritin: 387 ng/mL — ABNORMAL HIGH (ref 24–336)

## 2019-03-11 LAB — FOLATE: Folate: 5.5 ng/mL — ABNORMAL LOW (ref >5.9)

## 2019-03-11 LAB — SEDIMENTATION RATE: Sed Rate: 48 mm/hr — ABNORMAL HIGH (ref 0–16)

## 2019-03-11 LAB — D-DIMER, QUANTITATIVE: D-Dimer, Quant: 2.32 ug/mL-FEU — ABNORMAL HIGH (ref 0.00–0.50)

## 2019-03-11 LAB — PHOSPHORUS: Phosphorus: 3.1 mg/dL (ref 2.5–4.6)

## 2019-03-11 LAB — MAGNESIUM: Magnesium: 1.9 mg/dL (ref 1.7–2.4)

## 2019-03-11 MED ORDER — SODIUM BICARBONATE 650 MG PO TABS
650.0000 mg | ORAL_TABLET | Freq: Two times a day (BID) | ORAL | Status: DC
  Administered 2019-03-11 – 2019-03-12 (×3): 650 mg via ORAL
  Filled 2019-03-11 (×3): qty 1

## 2019-03-11 MED ORDER — SODIUM ZIRCONIUM CYCLOSILICATE 10 G PO PACK
10.0000 g | PACK | Freq: Every day | ORAL | Status: DC
  Administered 2019-03-12: 09:00:00 10 g via ORAL
  Filled 2019-03-11: qty 1

## 2019-03-11 MED ORDER — CARVEDILOL 25 MG PO TABS
25.0000 mg | ORAL_TABLET | Freq: Two times a day (BID) | ORAL | Status: DC
  Administered 2019-03-11 – 2019-03-12 (×2): 25 mg via ORAL
  Filled 2019-03-11 (×2): qty 1

## 2019-03-11 MED ORDER — ASCORBIC ACID 500 MG PO TABS
500.0000 mg | ORAL_TABLET | Freq: Every day | ORAL | Status: DC
  Administered 2019-03-11 – 2019-03-12 (×2): 500 mg via ORAL
  Filled 2019-03-11 (×2): qty 1

## 2019-03-11 NOTE — Progress Notes (Signed)
Subjective:  Pt looks good-   No c/o's- found to have elevated inflam markers so was started on remdesivir-  Kidney numbers and potassium much improved- 2 liters of UOP   Objective Vital signs in last 24 hours: Vitals:   03/11/19 0051 03/11/19 0458 03/11/19 0517 03/11/19 0903  BP: (!) 147/85  130/89 (!) 136/91  Pulse: 92  81 79  Resp: 20 20 16 17   Temp: (!) 97.5 F (36.4 C)  98.4 F (36.9 C) 97.7 F (36.5 C)  TempSrc: Oral  Oral Oral  SpO2: 97%  100% 96%  Weight:  134.2 kg    Height:       Weight change: -0.518 kg  Intake/Output Summary (Last 24 hours) at 03/11/2019 1125 Last data filed at 03/11/2019 5631 Gross per 24 hour  Intake --  Output 2225 ml  Net -2225 ml    Assessment/Plan: 59 year old BM-  Know HTN and CKD- crt in the 4's followed by Posey Pronto.  He presents with COVID and mild A on CRF band significant hyperkalemia 1.Renal- baseline crt in the 4's.  Now presents with mild A on CRF but mostly hyperkalemia.  Nonoliguric- not  uremic.  Potassium normalized with medical therapy.   the hyperkalemia was due to his aldactone and has normalized off of it.   2. Hyperkalemia-  normal now off aldactone-  I will stop the bicarb drip and dec the lokelma to daily- if k WNL next 2 days can stop it 3. Hypertension/volume  - really does not seem overloaded. Getting bicarb IVF-  Ok to stop 4.  Metabolic acidosis -  Due to renal failure.  stop iv bicarb but will put on pills  5.  Covid- per primary - now on steroids and remdesivir 6. Anemia  - not a major issue at this time  Renal will sign off-  Have stopped IV bicarb and put on oral bicarb.  Have dec lokelma to daily- if K normal next 2 days can stop it.  He had appt with Posey Pronto later this month- will need to be bumped until he is out of isolation-  Call with questions      Johnny [name]    Labs: Basic Metabolic Panel: Recent Labs  Lab 03/09/19 0517 03/09/19 0517 03/09/19 1000 03/10/19 1159 03/11/19 0305  NA 134*   < >  136 134* 134*  K 7.2*   < > 6.7* 4.3 4.8  CL 111   < > 111 103 103  CO2 15*   < > 16* 19* 21*  GLUCOSE 91   < > 110* 173* 161*  BUN 97*   < > 103* 86* 75*  CREATININE 4.83*   < > 4.87* 3.94* 3.45*  CALCIUM 8.7*   < > 9.0 8.6* 8.7*  PHOS 4.9*  --   --  4.1 3.1   < > = values in this interval not displayed.   Liver Function Tests: Recent Labs  Lab 03/09/19 0517 03/10/19 1159 03/11/19 0305  AST  --  18 16  ALT  --  11 13  ALKPHOS  --  40 39  BILITOT  --  0.5 0.5  PROT  --  7.0 7.0  ALBUMIN 2.7* 2.4* 2.3*   No results for input(s): LIPASE, AMYLASE in the last 168 hours. No results for input(s): AMMONIA in the last 168 hours. CBC: Recent Labs  Lab 03/08/19 2155 03/08/19 2155 03/09/19 0517 03/10/19 1159 03/11/19 0305  WBC 7.3   < > 5.6  5.5 3.8*  NEUTROABS 5.5  --   --  4.2 3.4  HGB 12.7*   < > 11.2* 10.3* 10.3*  HCT 40.7   < > 34.6* 31.0* 29.7*  MCV 89.3  --  87.4 83.6 81.6  PLT 403*   < > 351 347 377   < > = values in this interval not displayed.   Cardiac Enzymes: No results for input(s): CKTOTAL, CKMB, CKMBINDEX, TROPONINI in the last 168 hours. CBG: Recent Labs  Lab 03/10/19 2124  GLUCAP 200*    Iron Studies:  Recent Labs    03/11/19 0305  IRON 43*  TIBC 182*  FERRITIN 387*   Studies/Results: DG CHEST PORT 1 VIEW  Result Date: 03/11/2019 CLINICAL DATA:  COVID-19 positive. EXAM: PORTABLE CHEST 1 VIEW COMPARISON:  March 10, 2019. FINDINGS: The heart size and mediastinal contours are within normal limits. No pneumothorax or pleural effusion is noted. Stable bilateral patchy airspace opacities are noted concerning for multifocal pneumonia. The visualized skeletal structures are unremarkable. IMPRESSION: Stable bilateral patchy airspace opacities are noted concerning for multifocal pneumonia. Electronically Signed   By: Marijo Conception M.D.   On: 03/11/2019 10:03   DG CHEST PORT 1 VIEW  Result Date: 03/10/2019 CLINICAL DATA:  Shortness of breath. Patient  recently tested positive for COVID-19. EXAM: PORTABLE CHEST 1 VIEW COMPARISON:  Two-view chest x-ray 03/08/2019 FINDINGS: Heart size is normal. Lung volumes are low. Patchy bibasilar airspace opacities have increased slightly. IMPRESSION: Slight increase in patchy bibasilar airspace disease. This is concerning for pneumonia. Electronically Signed   By: San Morelle M.D.   On: 03/10/2019 10:23   Medications: Infusions: . remdesivir 100 mg in NS 100 mL 100 mg (03/11/19 0923)  .  sodium bicarbonate (isotonic) infusion in sterile water 75 mL/hr at 03/11/19 1003    Scheduled Medications: . albuterol  10 mg Nebulization Once  . ascorbic acid  500 mg Oral Daily  . dexamethasone (DECADRON) injection  6 mg Intravenous Q24H  . heparin  5,000 Units Subcutaneous Q8H  . sodium zirconium cyclosilicate  10 g Oral BID  . zinc sulfate  220 mg Oral Daily    have reviewed scheduled and prn medications.  Physical Exam: Gen-  Looks good, no c/o HEENT-  [place]la, eomi, MM moist Lungs- some scattered wheezing CV-  RRR abd-  Obese Ext-  No edema   03/11/2019,11:25 AM  LOS: 2 days

## 2019-03-11 NOTE — Progress Notes (Signed)
PROGRESS NOTE    Johnny [name]  JSH:[id number] DOB: Sep 30, 1960 DOA: 03/08/2019 PCP: Danna Hefty, DO   Brief Narrative:  HPI per Dr. Gala Romney on 03/08/2018 Johnny [name] is a 59 y.o. male with medical history significant of chronic kidney disease stage IV being followed by nephrology, history of LVH, diverticulosis, obstructive sleep apnea, hypertension, and acquired lymphedema who came in with progressive shortness of breath with exertion which has been going on for a few months but got worse in the last day or 2.  Patient also reported worsening swelling.  He is still making some urine.  But noted not as much as he used to.  He is also felt weak and dizzy.  He was seen in the ER and evaluated.  Potassium is more than 7.5 and BUN and creatinine have gotten worse.  Patient is being admitted with significant hyperkalemia and acute on chronic kidney failure.  He is denying any open sores in NSAID use.  No recent nausea vomiting or diarrhea.  He appears dehydrated however..  ED Course: Temperature is 97.7 blood pressure 143/88 pulse 75 respiratory 24 and oxygen sat 98% on room air.  White count 7.3 hemoglobin 12.7 and platelets 403.  Sodium 132 potassium more than 7.5 chloride 111 CO2 of 9 BUN 169 creatinine 5.42.  Calcium 9.6.  Creatinine was 3.94 in August of last year.  Patient is being admitted to the hospital for treatment.  He has received calcium carbonate as well as insulin and D50 in the ER.  **Interim History The patient tested positive for coronavirus and was found to have a pneumonia.  He was initiated on treatment with remdesivir and steroids and his inflammatory markers were elevated and now slowly ternding.  His renal function is improving with fluid hydration and his potassium is now normalized after several doses of treatment of Kayexalate and the measures taken.  Fluid hydration is to be now stopped as he is making good urine output and creatinine back to baseline.  Nephrology  hasdecreased his Milly Jakob to daily and if his potassium is within normal limits can stop within 2 days  Assessment & Plan:   Principal Problem:   Hyperkalemia Active Problems:   Essential hypertension   Acute kidney injury superimposed on CKD ([place])   Pneumonia 2/2 to COVID-19 Disease -Admitted to Inpatient Medical Telemetry due to Renal Fxn -Initial CXR showed "Patchy bilateral lower lobe airspace disease compatible with Pneumonia." -Repeat CXR this a.m. showed "Slight increase in patchy bibasilar airspace disease. This is concerning for pneumonia." -Checked Baseline Inflammatory Markers and Trend Daily: Recent Labs    03/08/19 2241 03/10/19 1159 03/11/19 0305  DDIMER 2.31* 2.48* 2.32*  FERRITIN  --  389* 387*  LDH  --  133 120  CRP  --  6.0* 5.5*  -ESR was 95 -> 48 -Fbirinogen was 758 -> 769 -Check PCT and Blood Cx; PCT likely to be elevated in the setting of Renal Dysfunction -PCT was 0.18 and now improved to 0.16 Lab Results  Component Value Date   SARSCOV2NAA POSITIVE (A) 03/09/2019   [place] NEGATIVE 10/07/2018  -SpO2: 100 % -Started Treatment with 5 Days of Remdesivir and 10 Days of IV Decadron; Day 2/5 of Remdesivir and Day 2/10 of Steroids -C/w Antitussives with Guaifenesin-Dextromethorphan 10 mL po q4hprn Cough and Chlorpheniramine-Hydrocodone 5 mL po q12hprn -Check Blood Cx x2 and showed NGTD at <12 Hours  -Initially received IV ceftriaxone and azithromycin in the ED but will hold further Abx -Prone as much  as possible  -C/w Zinc 220 mg po Daily and Vitamin C  -Airborne and Contact Precautions -C/w Albuterol 1-2 puff IH q4hprn -Add Flutter Valve and Incentive Spirometry  -Continue to monitor and trend inflammatory markers and repeat chest x-ray imaging in the a.m.  Hyperkalemia, improved  -Likely secondary to worsening renal failure, the patient did receive insulin D50, and oral Kayexalate. -Was Transferred to [facility] His twelve-lead EKG showed peaked  T waves no changes in interval, no bradycardia. -Given Kayexalate every 4 hours.   -Given IV fluids will give him IV Lasix to try to bring down his potassium fluid overload. Now just on IVF  -He does not have a fistula, as when he saw Dr. Doren Custard as an outpatient he refused. -Improved significantly as his potassium went from greater than 7.5 and was 4.3 yesterday and today is 4.8 -Nephrology has decreased his Lokelma to daily and his potassium remains normal after 2 days we recommend stopping -Continue to monitor and trend -Repeat CMP in the morning  Acute Kidney injury on chronic kidney disease stage IV-V: -With a baseline creatinine of 3.9-4.2. -On admission 5.2, he was started on gentle IV fluid hydration his creatinine is marginally improved, will continue IV fluids with close monitoring. -Renal has Held his Aldactone -Not Uremic  -Improving -C/w Sodium Bicarbonate 150 mEQ IV at 75 mL/hr for now  -BUN/Cr went from 97/4.83 -> 103/4.87 -> 86/3.94 -> 75/3.42 -Avoid nephrotoxic medications, contrast dyes, hypotension, and renally adjust medications -Continue to monitor and trend renal function  -Repeat CMP in a.m. -Initiated nephrology's help and assistance  Metabolic Acidosis -Likely due to renal disease -Improving.  Patient's CO2 went from 9, chloride level of 111, and anion gap of 12 and improved to a CO2 of 21, anion gap of 10, and a chloride level 103 today  -Continue sodium bicarbonate infusion as above  -Nephrology following appreciate further evaluation recommendations  Hyponatremia -Patient sodium is now 134 and the same as yesterday  -Continue monitor and trend and continue with IV fluid hydration as above  -Repeat CMP in a.m.  Essential Hypertension -BP is now 130/89 -Given IV Lasix the day before yesterday -His Aldactone has been stopped; will resume his carvedilol continue to hold nephrotoxic medications including his furosemide 40 mg p.o. daily, spironolactone 100  mg p.o. daily, and hydrochlorothiazide 25 mg p.o. daily -May need PRN Hydralazine if uncontrolled -Continue to Monitor BP per Protocol  Hypomagnesemia -Patient's Mag Level was 1.6 and is 1.9 -Continue to Monitor and Replete as Necessary -Repeat Mag Level in AM   Normocytic Anemia/Anemia of Chronic Kidney Disease -Patient's hemoglobin/hematocrit went from 12.7/40.7 is now 10.3/31.0 -Checked Anemia Panel in AM and showed an Iron Level of 43, UIBC of 139, TIBC of 182, Saturation Ratios of 24, Ferritin of 387, Folate of 5.5, and Vitamin B12 Level of 506 -Continue to monitor for signs and symptoms of bleeding; currently no overt bleeding noted -Repeat CBC in a.m.  Morbid Obesity -Estimated body mass index is 43.69 kg/m as calculated from the following:   Height as of this encounter: 5' 9"  (1.753 m).   Weight as of this encounter: 134.2 kg. -Weight Loss and Dietary Counseling given   DVT prophylaxis: Heparin 5,000 units sq q8h  Code Status: FULL CODE  Family Communication: No family present at bedside  Disposition Plan: Pending clinical improvement of his Renal Fxn and Inflammatory Markers: Patient is currently being treated for Covid pneumonia and anticipate discharging after course of remdesivir  Consultants:  Nephrology    Procedures: None   Antimicrobials:  Anti-infectives (From admission, onward)   Start     Dose/Rate Route Frequency Ordered Stop   03/11/19 1000  remdesivir 100 mg in sodium chloride 0.9 % 100 mL IVPB     100 mg 200 mL/hr over 30 Minutes Intravenous Daily 03/10/19 1520 03/15/19 0959   03/10/19 1530  remdesivir 200 mg in sodium chloride 0.9% 250 mL IVPB     200 mg 580 mL/hr over 30 Minutes Intravenous Once 03/10/19 1520 03/10/19 1729   03/09/19 0030  cefTRIAXone (ROCEPHIN) 1 g in sodium chloride 0.9 % 100 mL IVPB     1 g 200 mL/hr over 30 Minutes Intravenous  Once 03/08/19 2335 03/09/19 0257   03/08/19 2345  azithromycin (ZITHROMAX) 500 mg in sodium  chloride 0.9 % 250 mL IVPB     500 mg 250 mL/hr over 60 Minutes Intravenous  Once 03/08/19 2335 03/09/19 0216     Subjective: Seen and examined at bedside and he states he is doing well.  Denies any chest pain, lightheadedness or dizziness.  Does have a cough which is nonproductive.  No other concerns or complaints at this time and has been urinating significantly.  States that he feels well and better.  Objective: Vitals:   03/10/19 2123 03/11/19 0051 03/11/19 0458 03/11/19 0517  BP: (!) 141/88 (!) 147/85  130/89  Pulse: 91 92  81  Resp: 15 20 20 16   Temp: 98.1 F (36.7 C) (!) 97.5 F (36.4 C)  98.4 F (36.9 C)  TempSrc: Oral Oral  Oral  SpO2: 98% 97%  100%  Weight:   134.2 kg   Height:        Intake/Output Summary (Last 24 hours) at 03/11/2019 0824 Last data filed at 03/11/2019 0600 Gross per 24 hour  Intake -  Output 2125 ml  Net -2125 ml   Filed Weights   03/09/19 1445 03/10/19 0533 03/11/19 0458  Weight: 134.7 kg 135 kg 134.2 kg   Examination: Physical Exam:  Constitutional: WN/WD African-American male currently in no acute distress appears calm Eyes: Lids and conjunctivae normal, sclerae anicteric  ENMT: External Ears, Nose appear normal. Grossly normal hearing. Mucous membranes are moist.  Neck: Appears normal, supple, no cervical masses, normal ROM, no appreciable thyromegaly; no JVD  Respiratory: Diminished to auscultation bilaterally with coarse breath sounds, no wheezing, rales, rhonchi or crackles. Normal respiratory effort and patient is not tachypenic. No accessory muscle use. Unlabored breathing and not wearing any supplemental O2 via [place]  Cardiovascular: RRR, no murmurs / rubs / gallops. S1 and S2 auscultated. Trace to 1+ LE extremity edema.  Abdomen: Soft, non-tender, Distended 2/2 body habitus.  Bowel sounds positive x4.  GU: Deferred. Musculoskeletal: No clubbing / cyanosis of digits/nails. No joint deformity upper and lower extremities. Skin: No rashes,  lesions, ulcers on a limited skin evaluation. No induration; Warm and dry.  Neurologic: CN 2-12 grossly intact with no focal deficits. Romberg sign and cerebellar reflexes not assessed.  Psychiatric: Normal judgment and insight. Alert and oriented x 3. Normal mood and appropriate affect.   Data Reviewed: I have personally reviewed following labs and imaging studies  CBC: Recent Labs  Lab 03/08/19 2155 03/09/19 0517 03/10/19 1159 03/11/19 0305  WBC 7.3 5.6 5.5 3.8*  NEUTROABS 5.5  --  4.2 3.4  HGB 12.7* 11.2* 10.3* 10.3*  HCT 40.7 34.6* 31.0* 29.7*  MCV 89.3 87.4 83.6 81.6  PLT 403* 351 347 377   Basic  Metabolic Panel: Recent Labs  Lab 03/08/19 2155 03/09/19 0517 03/09/19 1000 03/10/19 1159 03/11/19 0305  NA 132* 134* 136 134* 134*  K >7.5* 7.2* 6.7* 4.3 4.8  CL 111 111 111 103 103  CO2 9* 15* 16* 19* 21*  GLUCOSE 93 91 110* 173* 161*  BUN 116* 97* 103* 86* 75*  CREATININE 5.42* 4.83* 4.87* 3.94* 3.45*  CALCIUM 9.6 8.7* 9.0 8.6* 8.7*  MG  --   --   --  1.6* 1.9  PHOS  --  4.9*  --  4.1 3.1   GFR: Estimated Creatinine Clearance: 32.1 mL/min (A) (by C-G formula based on SCr of 3.45 mg/dL (H)). Liver Function Tests: Recent Labs  Lab 03/09/19 0517 03/10/19 1159 03/11/19 0305  AST  --  18 16  ALT  --  11 13  ALKPHOS  --  40 39  BILITOT  --  0.5 0.5  PROT  --  7.0 7.0  ALBUMIN 2.7* 2.4* 2.3*   No results for input(s): LIPASE, AMYLASE in the last 168 hours. No results for input(s): AMMONIA in the last 168 hours. Coagulation Profile: No results for input(s): INR, PROTIME in the last 168 hours. Cardiac Enzymes: No results for input(s): CKTOTAL, CKMB, CKMBINDEX, TROPONINI in the last 168 hours. BNP (last 3 results) No results for input(s): PROBNP in the last 8760 hours. HbA1C: No results for input(s): HGBA1C in the last 72 hours. CBG: Recent Labs  Lab 03/10/19 2124  GLUCAP 200*   Lipid Profile: No results for input(s): CHOL, HDL, LDLCALC, TRIG, CHOLHDL,  LDLDIRECT in the last 72 hours. Thyroid Function Tests: No results for input(s): TSH, T4TOTAL, FREET4, T3FREE, THYROIDAB in the last 72 hours. Anemia Panel: Recent Labs    03/10/19 1159 03/11/19 0305  VITAMINB12  --  506  FOLATE  --  5.5*  FERRITIN 389* 387*  TIBC  --  182*  IRON  --  43*  RETICCTPCT  --  1.3   Sepsis Labs: Recent Labs  Lab 03/10/19 1905 03/11/19 0305  PROCALCITON 0.18 0.16    Recent Results (from the past 240 hour(s))  Respiratory Panel by RT PCR (Flu A&B, Covid) - Nasopharyngeal Swab     Status: Abnormal   Collection Time: 03/09/19 12:01 AM   Specimen: Nasopharyngeal Swab  Result Value Ref Range Status   SARS Coronavirus 2 by RT PCR POSITIVE (A) NEGATIVE Final    Comment: CRITICAL RESULT CALLED TO, READ BACK BY AND VERIFIED WITH: RN REBECCA AT 0410 03/09/19 CRUICKSHANK A (NOTE) SARS-CoV-2 target nucleic acids are DETECTED. SARS-CoV-2 RNA is generally detectable in upper respiratory specimens  during the acute phase of infection. Positive results are indicative of the presence of the identified virus, but do not rule out bacterial infection or co-infection with other pathogens not detected by the test. Clinical correlation with patient history and other diagnostic information is necessary to determine patient infection status. The expected result is Negative. Fact Sheet for Patients:  PinkCheek.be Fact Sheet for Healthcare Providers: GravelBags.it This test is not yet approved or cleared by the [place] FDA and  has been authorized for detection and/or diagnosis of SARS-CoV-2 by FDA under an Emergency Use Authorization (EUA).  This EUA will remain in effect (meaning this test  can be used) for the duration of  the COVID-19 declaration under Section 564(b)(1) of the Act, 21 U.S.C. section 360bbb-3(b)(1), unless the authorization is terminated or revoked sooner.    Influenza A by PCR  NEGATIVE NEGATIVE Final  Influenza B by PCR NEGATIVE NEGATIVE Final    Comment: (NOTE) The Xpert Xpress SARS-CoV-2/FLU/RSV assay is intended as an aid in  the diagnosis of influenza from Nasopharyngeal swab specimens and  should not be used as a sole basis for treatment. Nasal washings and  aspirates are unacceptable for Xpert Xpress SARS-CoV-2/FLU/RSV  testing. Fact Sheet for Patients: PinkCheek.be Fact Sheet for Healthcare Providers: GravelBags.it This test is not yet approved or cleared by the [place] FDA and  has been authorized for detection and/or diagnosis of SARS-CoV-2 by  FDA under an Emergency Use Authorization (EUA). This EUA will remain  in effect (meaning this test can be used) for the duration of the  Covid-19 declaration under Section 564(b)(1) of the Act, 21  U.S.C. section 360bbb-3(b)(1), unless the authorization is  terminated or revoked. Performed at [facility], [place] [address], [place], [place] [address]   Culture, blood (routine x 2)     Status: None (Preliminary result)   Collection Time: 03/10/19  7:05 PM   Specimen: BLOOD  Result Value Ref Range Status   Specimen Description BLOOD LEFT ANTECUBITAL  Final   Special Requests   Final    BOTTLES DRAWN AEROBIC ONLY Blood Culture adequate volume   Culture   Final    NO GROWTH < 12 HOURS Performed at [facility], [place] [address] [place], [place] [address]    Report Status PENDING  Incomplete  Culture, blood (routine x 2)     Status: None (Preliminary result)   Collection Time: 03/10/19  7:10 PM   Specimen: BLOOD LEFT HAND  Result Value Ref Range Status   Specimen Description BLOOD LEFT HAND  Final   Special Requests   Final    BOTTLES DRAWN AEROBIC ONLY Blood Culture adequate volume   Culture   Final    NO GROWTH < 12 HOURS Performed at [facility], [place] [address] [place], [place] [address]    Report Status  PENDING  Incomplete    Radiology Studies: DG CHEST PORT 1 VIEW  Result Date: 03/10/2019 CLINICAL DATA:  Shortness of breath. Patient recently tested positive for COVID-19. EXAM: PORTABLE CHEST 1 VIEW COMPARISON:  Two-view chest x-ray 03/08/2019 FINDINGS: Heart size is normal. Lung volumes are low. Patchy bibasilar airspace opacities have increased slightly. IMPRESSION: Slight increase in patchy bibasilar airspace disease. This is concerning for pneumonia. Electronically Signed   By: San Morelle M.D.   On: 03/10/2019 10:23   Scheduled Meds: . albuterol  10 mg Nebulization Once  . ascorbic acid  500 mg Oral Daily  . dexamethasone (DECADRON) injection  6 mg Intravenous Q24H  . heparin  5,000 Units Subcutaneous Q8H  . sodium zirconium cyclosilicate  10 g Oral BID  . zinc sulfate  220 mg Oral Daily   Continuous Infusions: . remdesivir 100 mg in NS 100 mL    .  sodium bicarbonate (isotonic) infusion in sterile water 75 mL/hr at 03/10/19 2136    LOS: 2 days   Kerney Elbe, DO [facility] PAGER is on [place]  If 7PM-7AM, please contact night-coverage [email or web address]

## 2019-03-12 LAB — CBC WITH DIFFERENTIAL/PLATELET
Abs Immature Granulocytes: 0.06 10*3/uL (ref 0.00–0.07)
Basophils Absolute: 0 10*3/uL (ref 0.0–0.1)
Basophils Relative: 0 %
Eosinophils Absolute: 0 10*3/uL (ref 0.0–0.5)
Eosinophils Relative: 0 %
HCT: 30.6 % — ABNORMAL LOW (ref 39.0–52.0)
Hemoglobin: 10.3 g/dL — ABNORMAL LOW (ref 13.0–17.0)
Immature Granulocytes: 1 %
Lymphocytes Relative: 7 %
Lymphs Abs: 0.6 10*3/uL — ABNORMAL LOW (ref 0.7–4.0)
MCH: 28 pg (ref 26.0–34.0)
MCHC: 33.7 g/dL (ref 30.0–36.0)
MCV: 83.2 fL (ref 80.0–100.0)
Monocytes Absolute: 0.3 10*3/uL (ref 0.1–1.0)
Monocytes Relative: 3 %
Neutro Abs: 8.1 10*3/uL — ABNORMAL HIGH (ref 1.7–7.7)
Neutrophils Relative %: 89 %
Platelets: 391 10*3/uL (ref 150–400)
RBC: 3.68 MIL/uL — ABNORMAL LOW (ref 4.22–5.81)
RDW: 14.7 % (ref 11.5–15.5)
WBC: 9.1 10*3/uL (ref 4.0–10.5)
nRBC: 0 % (ref 0.0–0.2)

## 2019-03-12 LAB — RENAL FUNCTION PANEL
Albumin: 2.3 g/dL — ABNORMAL LOW (ref 3.5–5.0)
Anion gap: 11 (ref 5–15)
BUN: 70 mg/dL — ABNORMAL HIGH (ref 6–20)
CO2: 23 mmol/L (ref 22–32)
Calcium: 8.6 mg/dL — ABNORMAL LOW (ref 8.9–10.3)
Chloride: 101 mmol/L (ref 98–111)
Creatinine, Ser: 3.33 mg/dL — ABNORMAL HIGH (ref 0.61–1.24)
GFR calc Af Amer: 23 mL/min — ABNORMAL LOW (ref >60)
GFR calc non Af Amer: 19 mL/min — ABNORMAL LOW (ref >60)
Glucose, Bld: 140 mg/dL — ABNORMAL HIGH (ref 70–99)
Phosphorus: 4.2 mg/dL (ref 2.5–4.6)
Potassium: 4.7 mmol/L (ref 3.5–5.1)
Sodium: 135 mmol/L (ref 135–145)

## 2019-03-12 LAB — SEDIMENTATION RATE: Sed Rate: 94 mm/hr — ABNORMAL HIGH (ref 0–16)

## 2019-03-12 LAB — MAGNESIUM: Magnesium: 1.9 mg/dL (ref 1.7–2.4)

## 2019-03-12 LAB — FIBRINOGEN: Fibrinogen: 627 mg/dL — ABNORMAL HIGH (ref 210–475)

## 2019-03-12 LAB — PHOSPHORUS: Phosphorus: 3.9 mg/dL (ref 2.5–4.6)

## 2019-03-12 LAB — FERRITIN: Ferritin: 389 ng/mL — ABNORMAL HIGH (ref 24–336)

## 2019-03-12 LAB — C-REACTIVE PROTEIN: CRP: 2.9 mg/dL — ABNORMAL HIGH (ref <1.0)

## 2019-03-12 LAB — LACTATE DEHYDROGENASE: LDH: 124 U/L (ref 98–192)

## 2019-03-12 LAB — PROCALCITONIN: Procalcitonin: 0.15 ng/mL

## 2019-03-12 LAB — D-DIMER, QUANTITATIVE: D-Dimer, Quant: 2.26 ug/mL-FEU — ABNORMAL HIGH (ref 0.00–0.50)

## 2019-03-12 MED ORDER — ZINC SULFATE 220 (50 ZN) MG PO CAPS: 220.0000 mg | ORAL_CAPSULE | Freq: Every day | ORAL | 0 refills | Status: DC

## 2019-03-12 MED ORDER — ASCORBIC ACID 500 MG PO TABS: 500.0000 mg | ORAL_TABLET | Freq: Every day | ORAL | 0 refills | Status: DC

## 2019-03-12 MED ORDER — ONDANSETRON HCL 4 MG PO TABS: 4.0000 mg | ORAL_TABLET | Freq: Four times a day (QID) | ORAL | 0 refills | Status: DC | PRN

## 2019-03-12 MED ORDER — SODIUM ZIRCONIUM CYCLOSILICATE 10 G PO PACK: 10.0000 g | PACK | Freq: Every day | ORAL | 0 refills | Status: DC

## 2019-03-12 MED ORDER — DEXAMETHASONE 6 MG PO TABS: 6.0000 mg | ORAL_TABLET | Freq: Every day | ORAL | 0 refills | Status: AC

## 2019-03-12 MED ORDER — NEPRO/CARBSTEADY PO LIQD: 237.0000 mL | Freq: Three times a day (TID) | ORAL | 0 refills | Status: DC | PRN

## 2019-03-12 MED ORDER — SODIUM BICARBONATE 650 MG PO TABS: 650.0000 mg | ORAL_TABLET | Freq: Two times a day (BID) | ORAL | 0 refills | Status: DC

## 2019-03-12 MED ORDER — ALBUTEROL SULFATE HFA 108 (90 BASE) MCG/ACT IN AERS: 1.0000 | INHALATION_SPRAY | RESPIRATORY_TRACT | 0 refills | Status: DC | PRN

## 2019-03-12 MED ORDER — GUAIFENESIN-DM 100-10 MG/5ML PO SYRP: 10.0000 mL | ORAL_SOLUTION | ORAL | 0 refills | Status: DC | PRN

## 2019-03-12 NOTE — Progress Notes (Signed)
Patient scheduled for outpatient Remdesivir infusion at 1:00 PM on Tuesday 1/19 and Wednesday 1/20.  Please advise them to report to [facility] at [address]  Drive to the security guard and tell them you are here for an infusion. They will direct you to the front entrance where we will come and get you.  For questions call [phone].  Thanks

## 2019-03-12 NOTE — Discharge Instructions (Signed)
You are scheduled for an outpatient infusion of Remdesivir at 1:00 PM on Tuesday 1/19 and Wednesday 1/20.  Please report to [facility] at [address]  Drive to the security guard and tell them you are here for an infusion. They will direct you to the front entrance where we will come and get you.  For questions call [phone].  Thanks

## 2019-03-12 NOTE — Progress Notes (Signed)
Pt given discharge instructions, prescriptions, and care notes. Pt verbalized understanding AEB no further questions or concerns at this time. IV was discontinued, no redness, pain, or swelling noted at this time. Telemetry discontinued and Centralized Telemetry was notified. Pt left the floor via wheelchair with staff in stable condition. 

## 2019-03-12 NOTE — Progress Notes (Signed)
SATURATION QUALIFICATIONS: (This note is used to comply with regulatory documentation for home oxygen)  Patient Saturations on Room Air at Rest = 98%  Patient Saturations on Room Air while Ambulating = 96%  Please briefly explain why patient needs home oxygen: N/A, Pt does not meet requirements of home oxygen needs.

## 2019-03-12 NOTE — Discharge Summary (Signed)
Physician Discharge Summary  Humzah Harty FXT:[id number] DOB: [birth year] DOA: 03/08/2019  PCP: Danna Hefty, DO  Admit date: 03/08/2019 Discharge date: 03/12/2019  Admitted From: Home Disposition: Home  Recommendations for Outpatient Follow-up:  1. Follow up with PCP in 1-2 weeks 2. Complete Remdesivir Infusion at [facility] for Infusion on 1/19 and 1/20 3. Follow-up with nephrology in the outpatient setting in 1 to 2 weeks 4. Please obtain CMP/CBC, Mag, Phos in one week 5. Please follow up on the following pending results:  Home Health: No  Equipment/Devices: None    Discharge Condition: Stable CODE STATUS: FULL CODE Diet recommendation: Renal Diet with 1200 mL Fluid Restriction  Brief/Interim Summary: HPI per Dr. Gala Romney on 03/08/2018 Johnny [name] a 59 y.o.malewith medical history significant ofchronic kidney disease stage IV being followed by nephrology, history of LVH, diverticulosis, obstructive sleep apnea, hypertension, and acquired lymphedema who came in with progressive shortness of breath with exertion which has been going on for a few months but got worse in the last day or 2. Patient also reported worsening swelling. He is still making some urine. But noted not as much as he used to. He is also felt weak and dizzy. He was seen in the ER and evaluated. Potassium is more than 7.5 and BUN and creatinine have gotten worse. Patient is being admitted with significant hyperkalemia and acute on chronic kidney failure. He is denying any open sores in NSAID use. No recent nausea vomiting or diarrhea. He appears dehydrated however..  ED Course:Temperature is 97.7 blood pressure 143/88 pulse 75 respiratory 24 and oxygen sat 98% on room air. White count 7.3 hemoglobin 12.7 and platelets 403. Sodium 132 potassium more than 7.5 chloride 111 CO2 of 9 BUN 169 creatinine 5.42. Calcium 9.6. Creatinine was 3.94 in August of last year. Patient is being  admitted to the hospital for treatment. He has received calcium carbonate as well as insulin and D50 in the ER.  **Interim History The patient tested positive for coronavirus and was found to have a pneumonia.  He was initiated on treatment with remdesivir and steroids and his inflammatory markers were elevated and now slowly ternding.  His renal function is improving with fluid hydration and his potassium is now normalized after several doses of treatment of Kayexalate and the measures taken.  Fluid hydration is to be now stopped as he is making good urine output and creatinine back to baseline.  Nephrology has decreased his Lokelma to daily and if his potassium is within normal limits can stop within 1 day days.  Patient's inflammatory markers are trending down and he received 3 doses remdesivir.  He can continue his remdesivir treatment in the outpatient setting as he is stable and not hypoxic or complaining of any shortness of breath.  He does continue to have a cough but this is secondary to his Covid pneumonia.  He is stable for discharge and was recommended to home isolate for 21 days and will continue remdesivir for 5 days total and has an appointment on 119 and 120 at 1 PM.  He will be written for the rest of the dexamethasone treatment for 10 days total.  He is stable for discharge only to follow-up with PCP, nephrology within 1 week.  Discharge Diagnoses:  Principal Problem:   Hyperkalemia Active Problems:   Essential hypertension   Acute kidney injury superimposed on CKD ([place])  Pneumonia 2/2 to COVID-19 Disease -Admitted to Inpatient Medical Telemetry due to Renal Fxn -Initial  CXR showed"Patchy bilateral lower lobe airspace disease compatible with Pneumonia." -Repeat CXR this 03/11/19 showed "Slight increase in patchy bibasilar airspace disease. This is concerning for pneumonia." -Checked Baseline Inflammatory Markers and Trend Daily: Recent Labs    03/10/19 1159 03/11/19 0305  03/12/19 0332  DDIMER 2.48* 2.32* 2.26*  FERRITIN 389* 387* 389*  LDH 133 120 124  CRP 6.0* 5.5* 2.9*  -ESR was 95 -> 48 -> 94 -Fbirinogen was 758 -> 769 -PCT was 0.18 and now improved to 0.16 -As his inflammatory markers are trending down and stable will discharge home and have the patient complete his remdesivir course since the outpatient setting as patient has arrangement to the [facility] clinic Lab Results  Component Value Date   [place] (A) 03/09/2019   [place] NEGATIVE 10/07/2018  -SpO2: 96 % -Started Treatment with 5 Days of Remdesivir and 10 Days of IV Decadron; Day3/5 of Remdesivir and Day3/10 of Steroids; patient to go out for the remdesivir infusion clinic and has an appointment scheduled on 119 and 120 at 1 PM; will change dexamethasone to p.o. for completion of 7 more days for total of 10 days -C/w Antitussives with Guaifenesin-Dextromethorphan 10 mL po q4hprn Cough and Chlorpheniramine-Hydrocodone 5 mL po q12hprn -Check Blood Cx x2 and showed NGTD at 2 Days   -Initially received IV ceftriaxone and azithromycin in the ED but will hold further Abx -Prone as much as possible  -C/w Zinc 220 mg po Daily and Vitamin C  -Airborne and Contact Precautions -C/w Albuterol 1-2 puff IH q4hprn -Add Flutter Valve and Incentive Spirometry  -Continue to monitor and trend inflammatory markers and repeat chest x-ray imaging in the a.m.  Hyperkalemia, improved  -Likely secondary to worsening renal failure, the patient did receive insulin D50, and oral Kayexalate. -Was Transferred to [facility] His twelve-lead EKG showed peaked T waves no changes in interval, nobradycardia. -Given Kayexalate every 4 hours. -Given IV fluids will give him IV Lasix to try to bring down his potassium fluid overload.  IV fluid hydration has now stopped -He does not have a fistula, as when he saw Dr. Doren Custard as an outpatient he refused. -Improved significantly as his potassium went from greater  than 7.5 and was 4.3  and today it was 4.7; -Nephrology has decreased his Lokelma to daily and his potassium remains normal after 2 days we recommend stopping -Continue to monitor and trend -Repeat CMP in the morning  Acute Kidney injury on chronic kidney disease stage IV-V: -With a baseline creatinine of 3.9-4.2. -On admission 5.2, he was started on gentle IV fluid hydration his creatinine is marginally improved, will continue IV fluids with close monitoring.  -Renal has Held his Aldactone -Not Uremic   -Improving -C/w Sodium Bicarbonate 150 mEQ IV at 75 mL/hr for now  -BUN/Cr went from 97/4.83 -> 103/4.87 -> 86/3.94 -> 75/3.42 -> 70/3.33 -Avoid nephrotoxic medications, contrast dyes, hypotension, and renally adjust medications -Continue to monitor and trend renal function  -Repeat CMP within 1 week -Appreciated nephrology evaluation and assistance patient to follow-up with his primary nephrologist within 1 week Plan  Metabolic Acidosis -Likely due to renal disease -Improving.  Patient's CO2 went from 9, chloride level of 111, and anion gap of 12 and improved to a CO2 of  23, anion gap of  11, and a chloride level 101 today  -Continue sodium bicarbonate infusion as above  -Nephrology following appreciate further evaluation recommendations  Hyponatremia, improved -Patient sodium is now 135 -IV fluid hydration is now stopped -  Repeat CMP within 1 week  Essential Hypertension -BP is now 142/88 -Given IV Lasix the day before yesterday -His Aldactone has been stopped; will resume his carvedilol continue to hold nephrotoxic medications including his furosemide 40 mg p.o. daily, spironolactone 100 mg p.o. daily, and hydrochlorothiazide 25 mg p.o. daily -Patient no longer takes hydrochlorothiazide and will resume his furosemide but continue to hold spironolactone; will continue carvedilol at discharge and further blood pressure management per nephrology -May need PRN Hydralazine if  uncontrolled -Continue to Monitor BP per Protocol  Hypomagnesemia -Patient's Mag Level was 1.6 and is 1.9 -Continue to Monitor and Replete as Necessary -Repeat Mag Level in AM   Normocytic Anemia/Anemia of Chronic Kidney Disease -Patient's hemoglobin/hematocrit went from 12.7/40.7 is now 10.3/30.6 -Checked Anemia Panel in AM and showed an Iron Level of 43, UIBC of 139, TIBC of 182, Saturation Ratios of 24, Ferritin of 387, Folate of 5.5, and Vitamin B12 Level of 506 -Continue to monitor for signs and symptoms of bleeding; currently no overt bleeding noted -Repeat CBC within 1 week  Morbid Obesity -Estimated body mass index is 43.69 kg/m as calculated from the following:   Height as of this encounter: 5' 9"  (1.753 m).   Weight as of this encounter: 134.2 kg. -Weight Loss and Dietary Counseling given    Discharge Instructions Discharge Instructions    Call MD for:  difficulty breathing, headache or visual disturbances   Complete by: As directed    Call MD for:  extreme fatigue   Complete by: As directed    Call MD for:  hives   Complete by: As directed    Call MD for:  persistant dizziness or light-headedness   Complete by: As directed    Call MD for:  persistant nausea and vomiting   Complete by: As directed    Call MD for:  redness, tenderness, or signs of infection (pain, swelling, redness, odor or green/yellow discharge around incision site)   Complete by: As directed    Call MD for:  severe uncontrolled pain   Complete by: As directed    Call MD for:  temperature >100.4   Complete by: As directed    Diet - low sodium heart healthy   Complete by: As directed    RENAL 1200 mL Fluid Restriction   Discharge instructions   Complete by: As directed    You were cared for by a hospitalist during your hospital stay. If you have any questions about your discharge medications or the care you received while you were in the hospital after you are discharged, you can call the  unit and ask to speak with the hospitalist on call if the hospitalist that took care of you is not available. Once you are discharged, your primary care physician will handle any further medical issues. Please note that NO REFILLS for any discharge medications will be authorized once you are discharged, as it is imperative that you return to your primary care physician (or establish a relationship with a primary care physician if you do not have one) for your aftercare needs so that they can reassess your need for medications and monitor your lab values.  Follow up with PCP and Nephrology. Take all medications as prescribed and please complete Remedesivir Infustion at the Clinic at [facility] (1/19) and (1/20) at 1:00 pm. If symptoms change or worsen please return to the ED for evaluation   ____________________________________________________________ Infection Prevention Recommendations for Individuals Confirmed to have, or Being Evaluated for,  2019 Novel Coronavirus (COVID-19) Infection Who Receive Care at Home  Individuals who are confirmed to have, or are being evaluated for, COVID-19 should follow the prevention steps below until a healthcare provider or local or state health department says they can return to normal activities.  Stay home except to get medical care You should restrict activities outside your home, except for getting medical care. Do not go to work, school, or public areas, and do not use public transportation or taxis.  Call ahead before visiting your doctor Before your medical appointment, call the healthcare provider and tell them that you have, or are being evaluated for, COVID-19 infection. This will help the healthcare provider's office take steps to keep other people from getting infected. Ask your healthcare provider to call the local or state health department.  Monitor your symptoms Seek prompt medical attention if your illness is worsening (e.g., difficulty  breathing). Before going to your medical appointment, call the healthcare provider and tell them that you have, or are being evaluated for, COVID-19 infection. Ask your healthcare provider to call the local or state health department.  Wear a facemask You should wear a facemask that covers your nose and mouth when you are in the same room with other people and when you visit a healthcare provider. People who live with or visit you should also wear a facemask while they are in the same room with you.  Separate yourself from other people in your home As much as possible, you should stay in a different room from other people in your home. Also, you should use a separate bathroom, if available.  Avoid sharing household items You should not share dishes, drinking glasses, cups, eating utensils, towels, bedding, or other items with other people in your home. After using these items, you should wash them thoroughly with soap and water.  Cover your coughs and sneezes Cover your mouth and nose with a tissue when you cough or sneeze, or you can cough or sneeze into your sleeve. Throw used tissues in a lined trash can, and immediately wash your hands with soap and water for at least 20 seconds or use an alcohol-based hand rub.  Wash your [facility] your hands often and thoroughly with soap and water for at least 20 seconds. You can use an alcohol-based hand sanitizer if soap and water are not available and if your hands are not visibly dirty. Avoid touching your eyes, nose, and mouth with unwashed hands.   Prevention Steps for Caregivers and Household Members of Individuals Confirmed to have, or Being Evaluated for, COVID-19 Infection Being Cared for in the Home  If you live with, or provide care at home for, a person confirmed to have, or being evaluated for, COVID-19 infection please follow these guidelines to prevent infection:  Follow healthcare provider's instructions Make sure that you  understand and can help the patient follow any healthcare provider instructions for all care.  Provide for the patient's basic needs You should help the patient with basic needs in the home and provide support for getting groceries, prescriptions, and other personal needs.  Monitor the patient's symptoms If they are getting sicker, call his or her medical provider and tell them that the patient has, or is being evaluated for, COVID-19 infection. This will help the healthcare provider's office take steps to keep other people from getting infected. Ask the healthcare provider to call the local or state health department.  Limit the number of people who have contact  with the patient If possible, have only one caregiver for the patient. Other household members should stay in another home or place of residence. If this is not possible, they should stay in another room, or be separated from the patient as much as possible. Use a separate bathroom, if available. Restrict visitors who do not have an essential need to be in the home.  Keep older adults, very young children, and other sick people away from the patient Keep older adults, very young children, and those who have compromised immune systems or chronic health conditions away from the patient. This includes people with chronic heart, lung, or kidney conditions, diabetes, and cancer.  Ensure good ventilation Make sure that shared spaces in the home have good air flow, such as from an air conditioner or an opened window, weather permitting.  Wash your hands often Wash your hands often and thoroughly with soap and water for at least 20 seconds. You can use an alcohol based hand sanitizer if soap and water are not available and if your hands are not visibly dirty. Avoid touching your eyes, nose, and mouth with unwashed hands. Use disposable paper towels to dry your hands. If not available, use dedicated cloth towels and replace them when they  become wet.  Wear a facemask and gloves Wear a disposable facemask at all times in the room and gloves when you touch or have contact with the patient's blood, body fluids, and/or secretions or excretions, such as sweat, saliva, sputum, nasal mucus, vomit, urine, or feces.  Ensure the mask fits over your nose and mouth tightly, and do not touch it during use. Throw out disposable facemasks and gloves after using them. Do not reuse. Wash your hands immediately after removing your facemask and gloves. If your personal clothing becomes contaminated, carefully remove clothing and launder. Wash your hands after handling contaminated clothing. Place all used disposable facemasks, gloves, and other waste in a lined container before disposing them with other household waste. Remove gloves and wash your hands immediately after handling these items.  Do not share dishes, glasses, or other household items with the patient Avoid sharing household items. You should not share dishes, drinking glasses, cups, eating utensils, towels, bedding, or other items with a patient who is confirmed to have, or being evaluated for, COVID-19 infection. After the person uses these items, you should wash them thoroughly with soap and water.  Wash laundry thoroughly Immediately remove and wash clothes or bedding that have blood, body fluids, and/or secretions or excretions, such as sweat, saliva, sputum, nasal mucus, vomit, urine, or feces, on them. Wear gloves when handling laundry from the patient. Read and follow directions on labels of laundry or clothing items and detergent. In general, wash and dry with the warmest temperatures recommended on the label.  Clean all areas the individual has used often Clean all touchable surfaces, such as counters, tabletops, doorknobs, bathroom fixtures, toilets, phones, keyboards, tablets, and bedside tables, every day. Also, clean any surfaces that may have blood, body fluids, and/or  secretions or excretions on them. Wear gloves when cleaning surfaces the patient has come in contact with. Use a diluted bleach solution (e.g., dilute bleach with 1 part bleach and 10 parts water) or a household disinfectant with a label that says EPA-registered for coronaviruses. To make a bleach solution at home, add 1 tablespoon of bleach to 1 quart (4 cups) of water. For a larger supply, add  cup of bleach to 1 gallon (16 cups) of  water. Read labels of cleaning products and follow recommendations provided on product labels. Labels contain instructions for safe and effective use of the cleaning product including precautions you should take when applying the product, such as wearing gloves or eye protection and making sure you have good ventilation during use of the product. Remove gloves and wash hands immediately after cleaning.  Monitor yourself for signs and symptoms of illness Caregivers and household members are considered close contacts, should monitor their health, and will be asked to limit movement outside of the home to the extent possible. Follow the monitoring steps for close contacts listed on the symptom monitoring form.   ? If you have additional questions, contact your local health department or call the epidemiologist on call at [phone] (available 24/7). ? This guidance is subject to change. For the most up-to-date guidance from [facility], please refer to their website: YouBlogs.pl  COVID-19 Vaccine Information can be found at: ShippingScam.co.uk For questions related to vaccine distribution or appointments, please [email or web address] or [phone].   Increase activity slowly   Complete by: As directed      Allergies as of 03/12/2019      Reactions   Lisinopril Swelling   Angioedema      Medication List    STOP taking these  medications   hydrochlorothiazide 25 MG tablet Commonly known as: HYDRODIURIL   HYDROcodone-acetaminophen 5-325 MG tablet Commonly known as: Norco   spironolactone 100 MG tablet Commonly known as: ALDACTONE     TAKE these medications   albuterol 108 (90 Base) MCG/ACT inhaler Commonly known as: VENTOLIN HFA Inhale 1-2 puffs into the lungs every 4 (four) hours as needed for wheezing or shortness of breath.   ascorbic acid 500 MG tablet Commonly known as: VITAMIN C Take 1 tablet (500 mg total) by mouth daily. Start taking on: March 13, 2019   aspirin EC 81 MG tablet Take 81 mg by mouth daily.   carvedilol 25 MG tablet Commonly known as: COREG TAKE 1 TABLET BY MOUTH TWICE DAILY WITH A MEAL What changed: See the new instructions.   dexamethasone 6 MG tablet Commonly known as: DECADRON Take 1 tablet (6 mg total) by mouth daily for 7 days.   feeding supplement (NEPRO CARB STEADY) Liqd Take 237 mLs by mouth 3 (three) times daily as needed (Supplement).   furosemide 40 MG tablet Commonly known as: LASIX Take 1 tablet (40 mg total) by mouth daily.   guaiFENesin-dextromethorphan 100-10 MG/5ML syrup Commonly known as: ROBITUSSIN DM Take 10 mLs by mouth every 4 (four) hours as needed for cough.   ondansetron 4 MG tablet Commonly known as: ZOFRAN Take 1 tablet (4 mg total) by mouth every 6 (six) hours as needed for nausea.   Rayaldee 30 MCG Cpcr Generic drug: Calcifediol ER Take 30 mcg by mouth daily.   sodium bicarbonate 650 MG tablet Take 1 tablet (650 mg total) by mouth 2 (two) times daily.   sodium zirconium cyclosilicate 10 g Pack packet Commonly known as: LOKELMA Take 10 g by mouth daily. Start taking on: March 13, 2019   zinc sulfate 220 (50 Zn) MG capsule Take 1 capsule (220 mg total) by mouth daily. Start taking on: March 13, 2019      Follow-up Information    Danna Hefty, DO. Call.   Specialty: Family Medicine Why: Follow up within 1 week  at D/C  Contact information: [address] [place] [address] [phone]        [place]  [place] Follow up.   Why: Scheduled for outpatient Remdesivir infusion at 1:00 PM on Tuesday 1/19 and Wednesday 1/20.  Please advise them to report to [facility] at [address]  Drive to the security guard and tell them you are here for an infusion.  [phone] Contact information: [place] [address] [phone]         Allergies  Allergen Reactions  . Lisinopril Swelling    Angioedema   Consultations:  Nephrology    Procedures/Studies: DG Chest 2 View  Result Date: 03/08/2019 CLINICAL DATA:  Shortness of breath EXAM: CHEST - 2 VIEW COMPARISON:  10/05/2015 FINDINGS: Patchy bilateral lower lobe airspace opacities compatible with pneumonia. Heart is normal size. No effusions or acute bony abnormality. IMPRESSION: Patchy bilateral lower lobe airspace disease compatible with pneumonia. Electronically Signed   By: Rolm Baptise M.D.   On: 03/08/2019 18:00   DG CHEST PORT 1 VIEW  Result Date: 03/11/2019 CLINICAL DATA:  COVID-19 positive. EXAM: PORTABLE CHEST 1 VIEW COMPARISON:  March 10, 2019. FINDINGS: The heart size and mediastinal contours are within normal limits. No pneumothorax or pleural effusion is noted. Stable bilateral patchy airspace opacities are noted concerning for multifocal pneumonia. The visualized skeletal structures are unremarkable. IMPRESSION: Stable bilateral patchy airspace opacities are noted concerning for multifocal pneumonia. Electronically Signed   By: Marijo Conception M.D.   On: 03/11/2019 10:03   DG CHEST PORT 1 VIEW  Result Date: 03/10/2019 CLINICAL DATA:  Shortness of breath. Patient recently tested positive for COVID-19. EXAM: PORTABLE CHEST 1 VIEW COMPARISON:  Two-view chest x-ray 03/08/2019 FINDINGS: Heart size is normal. Lung volumes are low. Patchy bibasilar airspace  opacities have increased slightly. IMPRESSION: Slight increase in patchy bibasilar airspace disease. This is concerning for pneumonia. Electronically Signed   By: San Morelle M.D.   On: 03/10/2019 10:23    Subjective: Seen and examined at bedside and he is doing well.  Denies any chest pain, lightheadedness or dizziness.  Continues to have a slight cough but is not feeling short of breath.  No other concerns or complaints at this time.  Discharge Exam: Vitals:   03/12/19 0530 03/12/19 0848  BP: (!) 143/85 (!) 142/88  Pulse: 62 63  Resp: 20 20  Temp: (!) 97.4 F (36.3 C)   SpO2: 97% 96%   Vitals:   03/11/19 2358 03/12/19 0515 03/12/19 0530 03/12/19 0848  BP: 136/88  (!) 143/85 (!) 142/88  Pulse: 67  62 63  Resp: (!) 21  20 20   Temp: 97.6 F (36.4 C)  (!) 97.4 F (36.3 C)   TempSrc: Oral  Oral   SpO2: 97%  97% 96%  Weight:  134.2 kg    Height:       General: Pt is alert, awake, not in acute distress Cardiovascular: RRR, S1/S2 +, no rubs, no gallops Respiratory: Diminished bilaterally, no wheezing, no rhonchi; Unlabored breathing  Abdominal: Soft, NT, Distended due to body habitus, bowel sounds + Extremities: no appreciable edema, no cyanosis  The results of significant diagnostics from this hospitalization (including imaging, microbiology, ancillary and laboratory) are listed below for reference.    Microbiology: Recent Results (from the past 240 hour(s))  Respiratory Panel by RT PCR (Flu A&B, Covid) - Nasopharyngeal Swab     Status: Abnormal   Collection Time: 03/09/19 12:01 AM   Specimen: Nasopharyngeal Swab  Result Value Ref Range Status   SARS Coronavirus 2 by RT PCR  POSITIVE (A) NEGATIVE Final    Comment: CRITICAL RESULT CALLED TO, READ BACK BY AND VERIFIED WITH: RN REBECCA AT 0410 03/09/19 CRUICKSHANK A (NOTE) SARS-CoV-2 target nucleic acids are DETECTED. SARS-CoV-2 RNA is generally detectable in upper respiratory specimens  during the acute phase of  infection. Positive results are indicative of the presence of the identified virus, but do not rule out bacterial infection or co-infection with other pathogens not detected by the test. Clinical correlation with patient history and other diagnostic information is necessary to determine patient infection status. The expected result is Negative. Fact Sheet for Patients:  PinkCheek.be Fact Sheet for Healthcare Providers: GravelBags.it This test is not yet approved or cleared by the [place] FDA and  has been authorized for detection and/or diagnosis of SARS-CoV-2 by FDA under an Emergency Use Authorization (EUA).  This EUA will remain in effect (meaning this test  can be used) for the duration of  the COVID-19 declaration under Section 564(b)(1) of the Act, 21 U.S.C. section 360bbb-3(b)(1), unless the authorization is terminated or revoked sooner.    Influenza A by PCR NEGATIVE NEGATIVE Final   Influenza B by PCR NEGATIVE NEGATIVE Final    Comment: (NOTE) The Xpert Xpress SARS-CoV-2/FLU/RSV assay is intended as an aid in  the diagnosis of influenza from Nasopharyngeal swab specimens and  should not be used as a sole basis for treatment. Nasal washings and  aspirates are unacceptable for Xpert Xpress SARS-CoV-2/FLU/RSV  testing. Fact Sheet for Patients: PinkCheek.be Fact Sheet for Healthcare Providers: GravelBags.it This test is not yet approved or cleared by the [place] FDA and  has been authorized for detection and/or diagnosis of SARS-CoV-2 by  FDA under an Emergency Use Authorization (EUA). This EUA will remain  in effect (meaning this test can be used) for the duration of the  Covid-19 declaration under Section 564(b)(1) of the Act, 21  U.S.C. section 360bbb-3(b)(1), unless the authorization is  terminated or revoked. Performed at [facility], [place] [address], [place], [place] [address]   Culture, blood (routine x 2)     Status: None (Preliminary result)   Collection Time: 03/10/19  7:05 PM   Specimen: BLOOD  Result Value Ref Range Status   Specimen Description BLOOD LEFT ANTECUBITAL  Final   Special Requests   Final    BOTTLES DRAWN AEROBIC ONLY Blood Culture adequate volume   Culture   Final    NO GROWTH 2 DAYS Performed at [facility], [place] [address] [place], [place] [address]    Report Status PENDING  Incomplete  Culture, blood (routine x 2)     Status: None (Preliminary result)   Collection Time: 03/10/19  7:10 PM   Specimen: BLOOD LEFT HAND  Result Value Ref Range Status   Specimen Description BLOOD LEFT HAND  Final   Special Requests   Final    BOTTLES DRAWN AEROBIC ONLY Blood Culture adequate volume   Culture   Final    NO GROWTH 2 DAYS Performed at [facility], [place] [address], , [place] [address]    Report Status PENDING  Incomplete    Labs: BNP (last 3 results) No results for input(s): BNP in the last 8760 hours. Basic Metabolic Panel: Recent Labs  Lab 03/09/19 0517 03/09/19 1000 03/10/19 1159 03/11/19 0305 03/12/19 0332 03/12/19 0911  NA 134* 136 134* 134* 135  --   K 7.2* 6.7* 4.3 4.8 4.7  --   CL 111 111 103  103 101  --   CO2 15* 16* 19* 21* 23  --   GLUCOSE 91 110* 173* 161* 140*  --   BUN 97* 103* 86* 75* 70*  --   CREATININE 4.83* 4.87* 3.94* 3.45* 3.33*  --   CALCIUM 8.7* 9.0 8.6* 8.7* 8.6*  --   MG  --   --  1.6* 1.9  --  1.9  PHOS 4.9*  --  4.1 3.1 4.2 3.9   Liver Function Tests: Recent Labs  Lab 03/09/19 0517 03/10/19 1159 03/11/19 0305 03/12/19 0332  AST  --  18 16  --   ALT  --  11 13  --   ALKPHOS  --  40 39  --   BILITOT  --  0.5 0.5  --   PROT  --  7.0 7.0  --   ALBUMIN 2.7* 2.4* 2.3* 2.3*   No results for input(s): LIPASE, AMYLASE in the last 168 hours. No results for input(s): AMMONIA in the last 168 hours. CBC: Recent Labs  Lab  03/08/19 2155 03/09/19 0517 03/10/19 1159 03/11/19 0305 03/12/19 0911  WBC 7.3 5.6 5.5 3.8* 9.1  NEUTROABS 5.5  --  4.2 3.4 8.1*  HGB 12.7* 11.2* 10.3* 10.3* 10.3*  HCT 40.7 34.6* 31.0* 29.7* 30.6*  MCV 89.3 87.4 83.6 81.6 83.2  PLT 403* 351 347 377 391   Cardiac Enzymes: No results for input(s): CKTOTAL, CKMB, CKMBINDEX, TROPONINI in the last 168 hours. BNP: Invalid input(s): POCBNP CBG: Recent Labs  Lab 03/10/19 2124  GLUCAP 200*   D-Dimer Recent Labs    03/11/19 0305 03/12/19 0332  DDIMER 2.32* 2.26*   Hgb A1c No results for input(s): HGBA1C in the last 72 hours. Lipid Profile No results for input(s): CHOL, HDL, LDLCALC, TRIG, CHOLHDL, LDLDIRECT in the last 72 hours. Thyroid function studies No results for input(s): TSH, T4TOTAL, T3FREE, THYROIDAB in the last 72 hours.  Invalid input(s): FREET3 Anemia work up Recent Labs    03/11/19 0305 03/12/19 0332  VITAMINB12 506  --   FOLATE 5.5*  --   FERRITIN 387* 389*  TIBC 182*  --   IRON 43*  --   RETICCTPCT 1.3  --    Urinalysis    Component Value Date/Time   COLORURINE YELLOW 10/05/2015 0603   APPEARANCEUR TURBID (A) 10/05/2015 0603   LABSPEC 1.014 10/05/2015 0603   PHURINE 5.5 10/05/2015 0603   GLUCOSEU NEGATIVE 10/05/2015 0603   HGBUR LARGE (A) 10/05/2015 0603   BILIRUBINUR NEGATIVE 10/05/2015 0603   BILIRUBINUR NEG 09/12/2014 1623   KETONESUR NEGATIVE 10/05/2015 0603   PROTEINUR 100 (A) 10/05/2015 0603   UROBILINOGEN 0.2 09/12/2014 1623   UROBILINOGEN 2.0 (H) 11/08/2009 1705   NITRITE POSITIVE (A) 10/05/2015 0603   LEUKOCYTESUR LARGE (A) 10/05/2015 0603   Sepsis Labs Invalid input(s): PROCALCITONIN,  WBC,  LACTICIDVEN Microbiology Recent Results (from the past 240 hour(s))  Respiratory Panel by RT PCR (Flu A&B, Covid) - Nasopharyngeal Swab     Status: Abnormal   Collection Time: 03/09/19 12:01 AM   Specimen: Nasopharyngeal Swab  Result Value Ref Range Status   SARS Coronavirus 2 by RT PCR  POSITIVE (A) NEGATIVE Final    Comment: CRITICAL RESULT CALLED TO, READ BACK BY AND VERIFIED WITH: RN REBECCA AT 0410 03/09/19 CRUICKSHANK A (NOTE) SARS-CoV-2 target nucleic acids are DETECTED. SARS-CoV-2 RNA is generally detectable in upper respiratory specimens  during the acute phase of infection. Positive results are indicative of the presence of the identified  virus, but do not rule out bacterial infection or co-infection with other pathogens not detected by the test. Clinical correlation with patient history and other diagnostic information is necessary to determine patient infection status. The expected result is Negative. Fact Sheet for Patients:  PinkCheek.be Fact Sheet for Healthcare Providers: GravelBags.it This test is not yet approved or cleared by the [place] FDA and  has been authorized for detection and/or diagnosis of SARS-CoV-2 by FDA under an Emergency Use Authorization (EUA).  This EUA will remain in effect (meaning this test  can be used) for the duration of  the COVID-19 declaration under Section 564(b)(1) of the Act, 21 U.S.C. section 360bbb-3(b)(1), unless the authorization is terminated or revoked sooner.    Influenza A by PCR NEGATIVE NEGATIVE Final   Influenza B by PCR NEGATIVE NEGATIVE Final    Comment: (NOTE) The Xpert Xpress SARS-CoV-2/FLU/RSV assay is intended as an aid in  the diagnosis of influenza from Nasopharyngeal swab specimens and  should not be used as a sole basis for treatment. Nasal washings and  aspirates are unacceptable for Xpert Xpress SARS-CoV-2/FLU/RSV  testing. Fact Sheet for Patients: PinkCheek.be Fact Sheet for Healthcare Providers: GravelBags.it This test is not yet approved or cleared by the [place] FDA and  has been authorized for detection and/or diagnosis of SARS-CoV-2 by  FDA under an Emergency Use  Authorization (EUA). This EUA will remain  in effect (meaning this test can be used) for the duration of the  Covid-19 declaration under Section 564(b)(1) of the Act, 21  U.S.C. section 360bbb-3(b)(1), unless the authorization is  terminated or revoked. Performed at [facility], [place] [address] [place], [place] [address]   Culture, blood (routine x 2)     Status: None (Preliminary result)   Collection Time: 03/10/19  7:05 PM   Specimen: BLOOD  Result Value Ref Range Status   Specimen Description BLOOD LEFT ANTECUBITAL  Final   Special Requests   Final    BOTTLES DRAWN AEROBIC ONLY Blood Culture adequate volume   Culture   Final    NO GROWTH 2 DAYS Performed at [facility], [place] [address], [place], [place] [address]    Report Status PENDING  Incomplete  Culture, blood (routine x 2)     Status: None (Preliminary result)   Collection Time: 03/10/19  7:10 PM   Specimen: BLOOD LEFT HAND  Result Value Ref Range Status   Specimen Description BLOOD LEFT HAND  Final   Special Requests   Final    BOTTLES DRAWN AEROBIC ONLY Blood Culture adequate volume   Culture   Final    NO GROWTH 2 DAYS Performed at [facility], [place] [address] [place], [place] [address]    Report Status PENDING  Incomplete   Time coordinating discharge: 35 minutes  SIGNED:  Kerney Elbe, DO [facility] 03/12/2019, 12:18 PM Pager is on [place]  If 7PM-7AM, please contact night-coverage [email or web address] Password [email or web address]

## 2019-03-12 NOTE — Care Management (Signed)
CM spoke with pt - pt denied concerns with returning to the home.  Pt informed he has support in the home.  Pt has PCP and denied barriers with paying for discharge medications.  Pt confirmed that he is aware of needing to go to the infusion clinic to finish his remedesivir.starting Tuesday  CM read aloud instructions for clinic appt to pt via phone.  No CM needs determined - no consults for CM ordered - discharge order is signed- CM signing off

## 2019-03-13 ENCOUNTER — Ambulatory Visit (HOSPITAL_COMMUNITY)
Admission: RE | Admit: 2019-03-13 | Discharge: 2019-03-13 | Disposition: A | Discharge status: Home / Self Care | Payer: Medicaid Other | Source: Ambulatory Visit | Attending: Pulmonary Disease | Admitting: Pulmonary Disease

## 2019-03-13 VITALS — BP 145/83 | HR 63 | Temp 97.8°F | Resp 18

## 2019-03-13 DIAGNOSIS — U071 COVID-19: Secondary | ICD-10-CM | POA: Diagnosis present

## 2019-03-13 MED ORDER — SODIUM CHLORIDE 0.9 % IV SOLN
INTRAVENOUS | Status: AC
  Filled 2019-03-13: qty 20

## 2019-03-13 MED ORDER — DIPHENHYDRAMINE HCL 50 MG/ML IJ SOLN: 50.0000 mg | Freq: Once | INTRAMUSCULAR | Status: DC | PRN

## 2019-03-13 MED ORDER — FAMOTIDINE IN NACL 20-0.9 MG/50ML-% IV SOLN: 20.0000 mg | Freq: Once | INTRAVENOUS | Status: DC | PRN

## 2019-03-13 MED ORDER — SODIUM CHLORIDE 0.9 % IV SOLN: INTRAVENOUS | Status: DC | PRN

## 2019-03-13 MED ORDER — ALBUTEROL SULFATE HFA 108 (90 BASE) MCG/ACT IN AERS: 2.0000 | INHALATION_SPRAY | Freq: Once | RESPIRATORY_TRACT | Status: DC | PRN

## 2019-03-13 MED ORDER — METHYLPREDNISOLONE SODIUM SUCC 125 MG IJ SOLR: 125.0000 mg | Freq: Once | INTRAMUSCULAR | Status: DC | PRN

## 2019-03-13 MED ORDER — EPINEPHRINE 0.3 MG/0.3ML IJ SOAJ: 0.3000 mg | Freq: Once | INTRAMUSCULAR | Status: DC | PRN

## 2019-03-13 MED ORDER — SODIUM CHLORIDE 0.9 % IV SOLN
100.0000 mg | Freq: Once | INTRAVENOUS | Status: AC
  Administered 2019-03-13: 13:00:00 100 mg via INTRAVENOUS

## 2019-03-13 NOTE — Progress Notes (Signed)
Patient ID: Johnny [name], male   DOB: [birth year], 59 y.o.   MRN: [id number]  Diagnosis: [id number]  Physician:  Procedure: [facility] Infusion Clinic Med: remdesivir infusion.  Complications: No immediate complications noted.  Discharge: Discharged home   Heide Scales 03/13/2019

## 2019-03-13 NOTE — Progress Notes (Deleted)
  Diagnosis: COVID-19  Physician: Dr. Joya Gaskins  Procedure: Covid Infusion Clinic Med: remdesivir infusion.  Complications: No immediate complications noted.  Discharge: Discharged home   Tia Masker 03/13/2019

## 2019-03-13 NOTE — Discharge Instructions (Signed)
10 Things You Can Do to Manage Your COVID-19 Symptoms at Home If you have possible or confirmed COVID-19: 1. Stay home from work and school. And stay away from other public places. If you must go out, avoid using any kind of public transportation, ridesharing, or taxis. 2. Monitor your symptoms carefully. If your symptoms get worse, call your healthcare provider immediately. 3. Get rest and stay hydrated. 4. If you have a medical appointment, call the healthcare provider ahead of time and tell them that you have or may have COVID-19. 5. For medical emergencies, call 911 and notify the dispatch personnel that you have or may have COVID-19. 6. Cover your cough and sneezes with a tissue or use the inside of your elbow. 7. Wash your hands often with soap and water for at least 20 seconds or clean your hands with an alcohol-based hand sanitizer that contains at least 60% alcohol. 8. As much as possible, stay in a specific room and away from other people in your home. Also, you should use a separate bathroom, if available. If you need to be around other people in or outside of the home, wear a mask. 9. Avoid sharing personal items with other people in your household, like dishes, towels, and bedding. 10. Clean all surfaces that are touched often, like counters, tabletops, and doorknobs. Use household cleaning sprays or wipes according to the label instructions. cdc.gov/coronavirus 08/23/2018 This information is not intended to replace advice given to you by your health care provider. Make sure you discuss any questions you have with your health care provider. Document Revised: 01/25/2019 Document Reviewed: 01/25/2019 Elsevier Patient Education  2020 Elsevier Inc.  

## 2019-03-14 ENCOUNTER — Ambulatory Visit (HOSPITAL_COMMUNITY)
Admit: 2019-03-14 | Discharge: 2019-03-14 | Disposition: A | Discharge status: Home / Self Care | Payer: Medicaid Other | Attending: Pulmonary Disease | Admitting: Pulmonary Disease

## 2019-03-14 DIAGNOSIS — U071 COVID-19: Secondary | ICD-10-CM

## 2019-03-14 MED ORDER — EPINEPHRINE 0.3 MG/0.3ML IJ SOAJ: 0.3000 mg | Freq: Once | INTRAMUSCULAR | Status: DC | PRN

## 2019-03-14 MED ORDER — ALBUTEROL SULFATE HFA 108 (90 BASE) MCG/ACT IN AERS: 2.0000 | INHALATION_SPRAY | Freq: Once | RESPIRATORY_TRACT | Status: DC | PRN

## 2019-03-14 MED ORDER — SODIUM CHLORIDE 0.9 % IV SOLN
INTRAVENOUS | Status: AC
  Administered 2019-03-14: 13:00:00 100 mg via INTRAVENOUS
  Filled 2019-03-14: qty 20

## 2019-03-14 MED ORDER — SODIUM CHLORIDE 0.9 % IV SOLN: 100.0000 mg | Freq: Once | INTRAVENOUS | Status: AC

## 2019-03-14 MED ORDER — FAMOTIDINE IN NACL 20-0.9 MG/50ML-% IV SOLN: 20.0000 mg | Freq: Once | INTRAVENOUS | Status: DC | PRN

## 2019-03-14 MED ORDER — SODIUM CHLORIDE 0.9 % IV SOLN: INTRAVENOUS | Status: DC | PRN

## 2019-03-14 MED ORDER — METHYLPREDNISOLONE SODIUM SUCC 125 MG IJ SOLR: 125.0000 mg | Freq: Once | INTRAMUSCULAR | Status: DC | PRN

## 2019-03-14 MED ORDER — DIPHENHYDRAMINE HCL 50 MG/ML IJ SOLN: 50.0000 mg | Freq: Once | INTRAMUSCULAR | Status: DC | PRN

## 2019-03-14 NOTE — Progress Notes (Signed)
  Diagnosis: COVID-19  Physician: Dr. Mina Marble  Procedure: Covid Infusion Clinic Med: remdesivir infusion.  Complications: No immediate complications noted.  Discharge: Discharged home   Johnny [name] 03/14/2019

## 2019-03-15 LAB — CULTURE, BLOOD (ROUTINE X 2)
Culture: NO GROWTH
Culture: NO GROWTH
Special Requests: ADEQUATE
Special Requests: ADEQUATE

## 2019-03-27 ENCOUNTER — Other Ambulatory Visit: Payer: Self-pay | Admitting: Family Medicine

## 2019-03-28 NOTE — Telephone Encounter (Signed)
Please have patient follow up as it has been >1 year since he followed up.

## 2019-03-29 NOTE — Telephone Encounter (Signed)
Patient states that this request "should have gone to his kidney doctor and that he will call back later to schedule an appointment at [facility]."  .Ozella Almond, CMA

## 2019-03-29 NOTE — Telephone Encounter (Signed)
Gotcha. Thank you for reaching out to him.

## 2019-03-30 ENCOUNTER — Other Ambulatory Visit: Payer: Self-pay | Admitting: Family Medicine

## 2019-03-30 ENCOUNTER — Ambulatory Visit (INDEPENDENT_AMBULATORY_CARE_PROVIDER_SITE_OTHER): Payer: Medicaid Other | Admitting: Plastic Surgery

## 2019-03-30 ENCOUNTER — Encounter: Payer: Self-pay | Admitting: Plastic Surgery

## 2019-03-30 ENCOUNTER — Other Ambulatory Visit: Payer: Self-pay

## 2019-03-30 VITALS — BP 151/102 | HR 76 | Temp 98.0°F | Ht 69.0 in | Wt 315.4 lb

## 2019-03-30 DIAGNOSIS — I890 Lymphedema, not elsewhere classified: Secondary | ICD-10-CM

## 2019-03-30 DIAGNOSIS — E881 Lipodystrophy, not elsewhere classified: Secondary | ICD-10-CM | POA: Diagnosis not present

## 2019-03-30 NOTE — Progress Notes (Signed)
   Subjective:    Patient ID: Johnny [name], male    DOB: 1960/03/27, 59 y.o.   MRN: [id number]  The patient is a very nice 59 year old man who has done an incredible job with his postop recovery.  He had lipodystrophy of the medial thighs.  These were excised at 2 different times.  The last procedure was in August and was on the left side.  It has improved a great deal.  There is still just a little bit of drainage coming from the most distal site.  There is a lot of swelling still.  It has improved.  He is doing great with wearing his compression socks.  There is no sign of infection.  He is not been able to get back to the gym due to Covid restrictions.  This is probably affecting the slower healing.     Review of Systems  Constitutional: Positive for activity change. Negative for appetite change.  Respiratory: Negative for chest tightness and shortness of breath.   Cardiovascular: Positive for leg swelling.  Gastrointestinal: Negative for abdominal distention.  Endocrine: Negative.   Genitourinary: Negative.   Musculoskeletal: Negative.   Psychiatric/Behavioral: Negative.        Objective:   Physical Exam Vitals and nursing note reviewed.  Constitutional:      Appearance: Normal appearance.  HENT:     Head: Normocephalic and atraumatic.  Cardiovascular:     Rate and Rhythm: Normal rate.     Pulses: Normal pulses.  Pulmonary:     Effort: Pulmonary effort is normal.  Musculoskeletal:       Legs:  Skin:    General: Skin is warm.  Neurological:     General: No focal deficit present.     Mental Status: He is alert and oriented to person, place, and time.  Psychiatric:        Mood and Affect: Mood normal.        Behavior: Behavior normal.        Thought Content: Thought content normal.        Assessment & Plan:     ICD-10-CM   1. 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  I am hopeful that this will help.  Continue with the compression socks.  Elevation as able and massage.  Follow-up in 3 months.  Call with any questions concerns or changes.   Pictures were obtained of the patient and placed in the chart with the patient's or guardian's permission.   The [place] was signed into law in 2016 which includes the topic of electronic health records.  This provides immediate access to information in MyChart.  This includes consultation notes, operative notes, office notes, lab results and pathology reports.  If you have any questions about what you read please let us know at your next visit or call us at the office.  We are right here with you.

## 2019-04-09 DIAGNOSIS — N184 Chronic kidney disease, stage 4 (severe): Secondary | ICD-10-CM | POA: Diagnosis not present

## 2019-04-20 DIAGNOSIS — N2581 Secondary hyperparathyroidism of renal origin: Secondary | ICD-10-CM | POA: Diagnosis not present

## 2019-04-20 DIAGNOSIS — D631 Anemia in chronic kidney disease: Secondary | ICD-10-CM | POA: Diagnosis not present

## 2019-04-20 DIAGNOSIS — N184 Chronic kidney disease, stage 4 (severe): Secondary | ICD-10-CM | POA: Diagnosis not present

## 2019-04-20 DIAGNOSIS — I129 Hypertensive chronic kidney disease with stage 1 through stage 4 chronic kidney disease, or unspecified chronic kidney disease: Secondary | ICD-10-CM | POA: Diagnosis not present

## 2019-06-11 ENCOUNTER — Other Ambulatory Visit: Payer: Self-pay

## 2019-06-11 ENCOUNTER — Encounter (HOSPITAL_COMMUNITY): Payer: Self-pay | Admitting: Emergency Medicine

## 2019-06-11 ENCOUNTER — Inpatient Hospital Stay (HOSPITAL_COMMUNITY)
Admission: EM | Admit: 2019-06-11 | Discharge: 2019-06-15 | DRG: 378 | Disposition: A | Discharge status: Home / Self Care | Payer: Medicaid Other | Attending: Family Medicine | Admitting: Family Medicine

## 2019-06-11 DIAGNOSIS — Z86711 Personal history of pulmonary embolism: Secondary | ICD-10-CM

## 2019-06-11 DIAGNOSIS — Z803 Family history of malignant neoplasm of breast: Secondary | ICD-10-CM

## 2019-06-11 DIAGNOSIS — E881 Lipodystrophy, not elsewhere classified: Secondary | ICD-10-CM | POA: Diagnosis present

## 2019-06-11 DIAGNOSIS — K635 Polyp of colon: Secondary | ICD-10-CM | POA: Diagnosis present

## 2019-06-11 DIAGNOSIS — D649 Anemia, unspecified: Secondary | ICD-10-CM

## 2019-06-11 DIAGNOSIS — I890 Lymphedema, not elsewhere classified: Secondary | ICD-10-CM | POA: Diagnosis not present

## 2019-06-11 DIAGNOSIS — Z8249 Family history of ischemic heart disease and other diseases of the circulatory system: Secondary | ICD-10-CM

## 2019-06-11 DIAGNOSIS — Z8701 Personal history of pneumonia (recurrent): Secondary | ICD-10-CM

## 2019-06-11 DIAGNOSIS — N179 Acute kidney failure, unspecified: Secondary | ICD-10-CM | POA: Diagnosis not present

## 2019-06-11 DIAGNOSIS — Z79899 Other long term (current) drug therapy: Secondary | ICD-10-CM | POA: Diagnosis not present

## 2019-06-11 DIAGNOSIS — Z800 Family history of malignant neoplasm of digestive organs: Secondary | ICD-10-CM

## 2019-06-11 DIAGNOSIS — K5731 Diverticulosis of large intestine without perforation or abscess with bleeding: Secondary | ICD-10-CM | POA: Diagnosis not present

## 2019-06-11 DIAGNOSIS — Z8616 Personal history of COVID-19: Secondary | ICD-10-CM | POA: Diagnosis not present

## 2019-06-11 DIAGNOSIS — R634 Abnormal weight loss: Secondary | ICD-10-CM | POA: Diagnosis present

## 2019-06-11 DIAGNOSIS — N189 Chronic kidney disease, unspecified: Secondary | ICD-10-CM

## 2019-06-11 DIAGNOSIS — R231 Pallor: Secondary | ICD-10-CM | POA: Diagnosis present

## 2019-06-11 DIAGNOSIS — E538 Deficiency of other specified B group vitamins: Secondary | ICD-10-CM | POA: Diagnosis present

## 2019-06-11 DIAGNOSIS — Z7982 Long term (current) use of aspirin: Secondary | ICD-10-CM | POA: Diagnosis not present

## 2019-06-11 DIAGNOSIS — D631 Anemia in chronic kidney disease: Secondary | ICD-10-CM | POA: Diagnosis present

## 2019-06-11 DIAGNOSIS — Z20822 Contact with and (suspected) exposure to covid-19: Secondary | ICD-10-CM | POA: Diagnosis not present

## 2019-06-11 DIAGNOSIS — D62 Acute posthemorrhagic anemia: Secondary | ICD-10-CM | POA: Diagnosis present

## 2019-06-11 DIAGNOSIS — K922 Gastrointestinal hemorrhage, unspecified: Secondary | ICD-10-CM

## 2019-06-11 DIAGNOSIS — Z888 Allergy status to other drugs, medicaments and biological substances status: Secondary | ICD-10-CM

## 2019-06-11 DIAGNOSIS — G4733 Obstructive sleep apnea (adult) (pediatric): Secondary | ICD-10-CM | POA: Diagnosis not present

## 2019-06-11 DIAGNOSIS — N184 Chronic kidney disease, stage 4 (severe): Secondary | ICD-10-CM | POA: Diagnosis present

## 2019-06-11 DIAGNOSIS — I129 Hypertensive chronic kidney disease with stage 1 through stage 4 chronic kidney disease, or unspecified chronic kidney disease: Secondary | ICD-10-CM | POA: Diagnosis present

## 2019-06-11 DIAGNOSIS — R600 Localized edema: Secondary | ICD-10-CM | POA: Diagnosis not present

## 2019-06-11 DIAGNOSIS — K648 Other hemorrhoids: Secondary | ICD-10-CM | POA: Diagnosis present

## 2019-06-11 DIAGNOSIS — Z6841 Body Mass Index (BMI) 40.0 and over, adult: Secondary | ICD-10-CM | POA: Diagnosis not present

## 2019-06-11 DIAGNOSIS — K625 Hemorrhage of anus and rectum: Secondary | ICD-10-CM | POA: Diagnosis not present

## 2019-06-11 DIAGNOSIS — R0602 Shortness of breath: Secondary | ICD-10-CM | POA: Diagnosis not present

## 2019-06-11 LAB — COMPREHENSIVE METABOLIC PANEL
ALT: 11 U/L (ref 0–44)
AST: 13 U/L — ABNORMAL LOW (ref 15–41)
Albumin: 2.7 g/dL — ABNORMAL LOW (ref 3.5–5.0)
Alkaline Phosphatase: 42 U/L (ref 38–126)
Anion gap: 9 (ref 5–15)
BUN: 70 mg/dL — ABNORMAL HIGH (ref 6–20)
CO2: 16 mmol/L — ABNORMAL LOW (ref 22–32)
Calcium: 7.9 mg/dL — ABNORMAL LOW (ref 8.9–10.3)
Chloride: 111 mmol/L (ref 98–111)
Creatinine, Ser: 4.37 mg/dL — ABNORMAL HIGH (ref 0.61–1.24)
GFR calc Af Amer: 16 mL/min — ABNORMAL LOW (ref >60)
GFR calc non Af Amer: 14 mL/min — ABNORMAL LOW (ref >60)
Glucose, Bld: 114 mg/dL — ABNORMAL HIGH (ref 70–99)
Potassium: 5.1 mmol/L (ref 3.5–5.1)
Sodium: 136 mmol/L (ref 135–145)
Total Bilirubin: 0.4 mg/dL (ref 0.3–1.2)
Total Protein: 5.5 g/dL — ABNORMAL LOW (ref 6.5–8.1)

## 2019-06-11 LAB — CBC
HCT: 16.9 % — ABNORMAL LOW (ref 39.0–52.0)
Hemoglobin: 5.4 g/dL — CL (ref 13.0–17.0)
MCH: 30.2 pg (ref 26.0–34.0)
MCHC: 32 g/dL (ref 30.0–36.0)
MCV: 94.4 fL (ref 80.0–100.0)
Platelets: 237 10*3/uL (ref 150–400)
RBC: 1.79 MIL/uL — ABNORMAL LOW (ref 4.22–5.81)
RDW: 15.4 % (ref 11.5–15.5)
WBC: 8 10*3/uL (ref 4.0–10.5)
nRBC: 0.3 % — ABNORMAL HIGH (ref 0.0–0.2)

## 2019-06-11 LAB — SARS CORONAVIRUS 2 (TAT 6-24 HRS): SARS Coronavirus 2: NEGATIVE

## 2019-06-11 LAB — PREPARE RBC (CROSSMATCH)

## 2019-06-11 MED ORDER — SODIUM BICARBONATE 650 MG PO TABS
650.0000 mg | ORAL_TABLET | Freq: Two times a day (BID) | ORAL | Status: DC
  Administered 2019-06-11 – 2019-06-13 (×4): 650 mg via ORAL
  Filled 2019-06-11 (×6): qty 1

## 2019-06-11 MED ORDER — CARVEDILOL 6.25 MG PO TABS
6.2500 mg | ORAL_TABLET | Freq: Two times a day (BID) | ORAL | Status: DC
  Administered 2019-06-12 – 2019-06-15 (×7): 6.25 mg via ORAL
  Filled 2019-06-11 (×7): qty 1

## 2019-06-11 MED ORDER — PEG-KCL-NACL-NASULF-NA ASC-C 100 G PO SOLR: 1.0000 | Freq: Once | ORAL | Status: DC

## 2019-06-11 MED ORDER — ALBUTEROL SULFATE (2.5 MG/3ML) 0.083% IN NEBU: 2.5000 mg | INHALATION_SOLUTION | RESPIRATORY_TRACT | Status: DC | PRN

## 2019-06-11 MED ORDER — PEG-KCL-NACL-NASULF-NA ASC-C 100 G PO SOLR
0.5000 | Freq: Once | ORAL | Status: AC
  Administered 2019-06-12: 18:00:00 100 g via ORAL
  Filled 2019-06-11: qty 1

## 2019-06-11 MED ORDER — PEG-KCL-NACL-NASULF-NA ASC-C 100 G PO SOLR
0.5000 | Freq: Once | ORAL | Status: DC
  Filled 2019-06-11: qty 1

## 2019-06-11 MED ORDER — ACETAMINOPHEN 325 MG PO TABS: 650.0000 mg | ORAL_TABLET | Freq: Four times a day (QID) | ORAL | Status: DC | PRN

## 2019-06-11 MED ORDER — SODIUM CHLORIDE 0.9 % IV SOLN
10.0000 mL/h | Freq: Once | INTRAVENOUS | Status: AC
  Administered 2019-06-11: 10 mL/h via INTRAVENOUS

## 2019-06-11 MED ORDER — PEG-KCL-NACL-NASULF-NA ASC-C 100 G PO SOLR
0.5000 | Freq: Once | ORAL | Status: AC
  Administered 2019-06-13: 100 g via ORAL
  Filled 2019-06-11: qty 1

## 2019-06-11 MED ORDER — BISACODYL 5 MG PO TBEC: 20.0000 mg | DELAYED_RELEASE_TABLET | Freq: Once | ORAL | Status: DC

## 2019-06-11 MED ORDER — PANTOPRAZOLE SODIUM 40 MG IV SOLR: 40.0000 mg | Freq: Two times a day (BID) | INTRAVENOUS | Status: DC

## 2019-06-11 MED ORDER — ACETAMINOPHEN 650 MG RE SUPP: 650.0000 mg | Freq: Four times a day (QID) | RECTAL | Status: DC | PRN

## 2019-06-11 MED ORDER — SODIUM CHLORIDE 0.9 % IV SOLN: INTRAVENOUS | Status: AC

## 2019-06-11 MED ORDER — WHITE PETROLATUM EX OINT
TOPICAL_OINTMENT | CUTANEOUS | Status: DC | PRN
  Filled 2019-06-11: qty 5
  Filled 2019-06-11: qty 28.35

## 2019-06-11 NOTE — ED Notes (Signed)
Lunch Tray Ordered @ 1737. 

## 2019-06-11 NOTE — H&P (View-Only) (Signed)
[facility] Consult: 2:14 PM 06/11/2019  LOS: 0 days    Referring Provider: Dr Higinio Plan MD resident int medicine Primary Care Physician:  Danna Hefty, DO Primary Gastroenterologist:  Dr Erskine Emery, performed colonoscopy 2013.    Reason for Consultation:  Painless hematochezia.  Blood loss anemia   HPI: Johnny [name] is a 59 y.o. male.  PMH CKD 4.  Obesity.  OSA, not on CPAP.   PE 2003, not on AC.  Lymphedema.  S/p panniculectomy of both thighs..   COVID-19 + pneumonia admission 1/14 -03/12/2019.  During admission he had AKI, hyperkalemia.  Anemia (Hgb 10.3) noted with low iron, low TIBC, ferritin 387, low folate, normal B12. Not on iron or folic acid.   01/2012 colonoscopy.  Screening study .  Showed diverticulosis in the descending, transverse, ascending colon.  Otherwise normal study.  Recommend repeat screening in 10 years.   Home meds include 81 ASA.    Presented to ED this morning with 2 days of painless hematochezia with stool, 3 episodes on Saturday another 3 episodes on Sunday.  Last episode was about 830 this morning and he feels like the interval between episodes has become longer but volume is still such that the commode water is completely red after he defecates.  Dizziness with activity.  No syncope.  No sweats, no chills.  No nausea, vomiting, abdominal pain.  No previous, even minor, bleeding per rectum.  Normally moves his bowels daily, brown stools.  No NSAIDs, just 81 mg aspirin daily. BP's low 100s/40s-60s.  Heart rate in the 70s.  Sats 100%.  Hgb 5.4, was 10.3 on 1/18.  MCV 94.  Normal platelets, normal WBCs.  2 PRBCs ordered. Normal LFTs. GFR 16, was 23 on 03/12/2019  Father had colon cancer in his 75s.  Rare alcohol consumption.  Past Medical History:  Diagnosis Date  .  Acquired lymphedema 12/13/2017  . Chronic kidney disease    Chronic Kidney disease-stage unknown  . Diverticulosis of large intestine without hemorrhage 01/31/2012  . Essential HTN 04/21/2006  . Excess skin of thigh    left thigh adipose excess  . Hypertension   . OSA 05/21/2013   does not wear CPAP any longer  . 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 Wears dentures     Past Surgical History:  Procedure Laterality Date  . ABDOMINOPLASTY/PANNICULECTOMY WITH LIPOSUCTION Left 10/11/2018   Procedure: Excision of left thigh lipodystrophy;  Surgeon: Wallace Going, DO;  Location: [facility];  Service: Plastics;  Laterality: Left;  . COLONOSCOPY  01/31/2012   Procedure: COLONOSCOPY;  Surgeon: Inda Castle, MD;  Location: [facility];  Service: Endoscopy;  Laterality: N/A;  bmi is 36  . MULTIPLE TOOTH EXTRACTIONS    . NO PAST SURGERIES    . PANNICULECTOMY Bilateral 04/20/2018   Procedure: Excision of right inner thighs;  Surgeon: Wallace Going, DO;  Location: [place];  Service: Plastics;  Laterality: Bilateral;  Excision of right inner thighs    Prior to Admission medications   Medication Sig Start Date End Date Taking? Authorizing  Provider  albuterol (VENTOLIN HFA) 108 (90 Base) MCG/ACT inhaler Inhale 1-2 puffs into the lungs every 4 (four) hours as needed for wheezing or shortness of breath. 03/12/19  Yes Sheikh, Omair Latif, DO  ascorbic acid (VITAMIN C) 500 MG tablet Take 1 tablet (500 mg total) by mouth daily. 03/13/19  Yes Sheikh, Omair Latif, DO  aspirin EC 81 MG tablet Take 81 mg by mouth daily.   Yes [provider]  carvedilol (COREG) 25 MG tablet Take 1 tablet (25 mg total) by mouth 2 (two) times daily with a meal. 03/31/19  Yes Mullis, Kiersten P, DO  furosemide (LASIX) 40 MG tablet Take 1 tablet (40 mg total) by mouth daily. 10/22/15  Yes [place] Bing, DO  RAYALDEE 30 MCG CPCR Take 30 mcg by mouth daily. 01/17/18  Yes [provider]  sodium bicarbonate 650 MG  tablet Take 1 tablet (650 mg total) by mouth 2 (two) times daily. 03/12/19  Yes Sheikh, Omair Latif, DO  sodium zirconium cyclosilicate (LOKELMA) 10 g PACK packet Take 10 g by mouth daily. Patient taking differently: Take 10 g by mouth once a week. On Friday 03/13/19  Yes Sheikh, Omair Latif, DO  zinc sulfate 220 (50 Zn) MG capsule Take 1 capsule (220 mg total) by mouth daily. 03/13/19  Yes Sheikh, Omair Latif, DO  guaiFENesin-dextromethorphan (ROBITUSSIN DM) 100-10 MG/5ML syrup Take 10 mLs by mouth every 4 (four) hours as needed for cough. Patient not taking: Reported on 06/11/2019 03/12/19   Raiford Noble Latif, DO  Nutritional Supplements (FEEDING SUPPLEMENT, NEPRO CARB STEADY,) LIQD Take 237 mLs by mouth 3 (three) times daily as needed (Supplement). Patient not taking: Reported on 06/11/2019 03/12/19   Raiford Noble Latif, DO  ondansetron (ZOFRAN) 4 MG tablet Take 1 tablet (4 mg total) by mouth every 6 (six) hours as needed for nausea. Patient not taking: Reported on 06/11/2019 03/12/19   Raiford Noble Latif, DO    Scheduled Meds: . sodium bicarbonate  650 mg Oral BID   Infusions: . sodium chloride    . sodium chloride     PRN Meds: acetaminophen **OR** acetaminophen, albuterol   Allergies as of 06/11/2019 - Review Complete 06/11/2019  Allergen Reaction Noted  . Lisinopril Swelling 09/05/2014    Family History  Problem Relation Age of Onset  . Breast cancer Mother   . Hypertension Mother   . Colon cancer Father        died at 60  . Hypertension Brother     Social History   Socioeconomic History  . Marital status: Single    Spouse name: Not on file  . Number of children: 1  . Years of education: Not on file  . Highest education level: Not on file  Occupational History  . Occupation: disabled    Fish farm manager: UNEMPLOYED  Tobacco Use  . Smoking status: Never Smoker  . Smokeless tobacco: Never Used  Substance and Sexual Activity  . Alcohol use: Yes    Comment: social-once a  week  . Drug use: No    Comment: stopped marijuana about Sep or Oct 2013  . Sexual activity: Not on file  Other Topics Concern  . Not on file  Social History Narrative  . Not on file   Social Determinants of Health   Financial Resource Strain:   . Difficulty of Paying Living Expenses:   Food Insecurity:   . Worried About Charity fundraiser in the Last Year:   . [employer]  in the Last Year:   Transportation Needs:   . Film/video editor (Medical):   Marland Kitchen Lack of Transportation (Non-Medical):   Physical Activity:   . Days of Exercise per Week:   . Minutes of Exercise per Session:   Stress:   . Feeling of Stress :   Social Connections:   . Frequency of Communication with Friends and Family:   . Frequency of Social Gatherings with Friends and Family:   . Attends Religious Services:   . Active Member of Clubs or Organizations:   . Attends Archivist Meetings:   Marland Kitchen Marital Status:   Intimate Partner Violence:   . Fear of Current or Ex-Partner:   . Emotionally Abused:   Marland Kitchen Physically Abused:   . Sexually Abused:     REVIEW OF SYSTEMS: Constitutional: No fatigue, no weakness. ENT:  No nose bleeds Pulm: Slight dyspnea.  No cough.  No lingering respiratory symptoms from his January 2021 Covid. CV:  No palpitations, no LE edema.  No angina GU: Still makes a lot of urine.  No hematuria, no frequency GI: No heartburn, no dysphagia.  See HPI. Heme: No excessive or unusual bleeding or bruising. Transfusions: First unit PRBC transfusing now. Neuro: No syncope.  Just dizziness.  No headaches, no peripheral tingling or numbness Derm:  No itching, no rash or sores.  Endocrine:  No sweats or chills.  No polyuria or dysuria Immunization: Reviewed. Travel:  None beyond local counties in last few months.    PHYSICAL EXAM: Vital signs in last 24 hours: Vitals:   06/11/19 1215 06/11/19 1400  BP: (!) 111/48 109/65  Pulse: 82 73  Resp: 18 15  Temp:    SpO2: 100% 100%    Wt Readings from Last 3 Encounters:  06/11/19 131.5 kg  03/30/19 (!) 143.1 kg  03/12/19 134.2 kg    General: This Head: No facial asymmetry or swelling.  No signs of head trauma. Eyes: No scleral icterus.  No conjunctival pallor.  EOMI. Ears: Not hard of hearing. Nose: No discharge, no congestion Mouth: Oral mucosa moist, pink, clear.  Tongue midline.  Good dentition. Neck: No masses, thyromegaly, JVD. Lungs: No labored breathing, no cough.  Lungs clear bilaterally. Heart: RRR.  No MRG.  S1, S2 present. Abdomen: Obese, soft, nontender.  Active bowel sounds.  No HSM, bruits, hernias appreciated..   Rectal: Not performed. Musc/Skeltl: No joint deformity. Extremities: Edema in the lower legs, feet and ankles. Neurologic: Alert.  Oriented x3.  Good historian.  No tremors or gross weakness.  No gross deficits.  Moves all 4 limbs. Skin: No open sores, rashes or suspicious lesions. Tattoos: None observed Nodes: No cervical adenopathy Psych: Calm, pleasant, cooperative.  Fluid speech.  Intake/Output from previous day: No intake/output data recorded. Intake/Output this shift: No intake/output data recorded.  LAB RESULTS: Recent Labs    06/11/19 1110  WBC 8.0  HGB 5.4*  HCT 16.9*  PLT 237   BMET Lab Results  Component Value Date   NA 136 06/11/2019   NA 135 03/12/2019   NA 134 (L) 03/11/2019   K 5.1 06/11/2019   K 4.7 03/12/2019   K 4.8 03/11/2019   CL 111 06/11/2019   CL 101 03/12/2019   CL 103 03/11/2019   CO2 16 (L) 06/11/2019   CO2 23 03/12/2019   CO2 21 (L) 03/11/2019   GLUCOSE 114 (H) 06/11/2019   GLUCOSE 140 (H) 03/12/2019   GLUCOSE 161 (H) 03/11/2019   BUN 70 (H)  06/11/2019   BUN 70 (H) 03/12/2019   BUN 75 (H) 03/11/2019   CREATININE 4.37 (H) 06/11/2019   CREATININE 3.33 (H) 03/12/2019   CREATININE 3.45 (H) 03/11/2019   CALCIUM 7.9 (L) 06/11/2019   CALCIUM 8.6 (L) 03/12/2019   CALCIUM 8.7 (L) 03/11/2019   LFT Recent Labs    06/11/19 1110  PROT  5.5*  ALBUMIN 2.7*  AST 13*  ALT 11  ALKPHOS 42  BILITOT 0.4   PT/INR Lab Results  Component Value Date   INR 2.2 12/28/2010   INR 2.2 11/30/2010   INR 2.6 11/02/2010   Hepatitis Panel No results for input(s): HEPBSAG, HCVAB, HEPAIGM, HEPBIGM in the last 72 hours. C-Diff No components found for: CDIFF Lipase  No results found for: LIPASE  Drugs of Abuse     Component Value Date/Time   LABOPIA NONE DETECTED 11/08/2009 0149   COCAINSCRNUR NONE DETECTED 11/08/2009 0149   LABBENZ NONE DETECTED 11/08/2009 0149   AMPHETMU NONE DETECTED 11/08/2009 0149   THCU NONE DETECTED 11/08/2009 0149   LABBARB  11/08/2009 0149    NONE DETECTED        DRUG SCREEN FOR MEDICAL PURPOSES ONLY.  IF CONFIRMATION IS NEEDED FOR ANY PURPOSE, NOTIFY LAB WITHIN 5 DAYS.        LOWEST DETECTABLE LIMITS FOR URINE DRUG SCREEN Drug Class       Cutoff (ng/mL) Amphetamine      1000 Barbiturate      200 Benzodiazepine   470 Tricyclics       962 Opiates          300 Cocaine          300 THC              50     RADIOLOGY STUDIES: No results found.   IMPRESSION:   *   Painless hematochezia.  By history this sounds like diverticular bleeding.  Known diverticulosis based on 2013 colonoscopy.  *    ABL anemia superimposed on anemia of chronic disease as well as anemia from folate deficiency.  *    Advanced CKD, currently stage V, no oliguria.    PLAN:     *   Allow clear liquids today and tomorrow.  Colonoscopy set for about 130 on Wednesday afternoon 06/13/2019.  Patient agreeable to proceed.  *    Follow-up Hgb after transfusion and in AM.   Check INR in Am.    *   This is a lower GI bleed therefore I stopped the IV Protonix.   Azucena Freed  06/11/2019, 2:14 PM Phone [phone]     Attending physician's note   I have taken an interval history, reviewed the chart and examined the patient. I agree with the Advanced Practitioner's note, impression and recommendations.   Likely  diverticular bleed. Had colon 2013 showing pancolonic diverticulosis. HD stable Acute on chronic anemia. Hb 10 to 5.4 s/p 2U Comorbid conditions include morbid obesity, OSA on CPAP, CKD 4 (GFR 68ml/min)  Plan: -Trend CBC.  Keep Hb>7. -If active brisk bleeding, RBC scan followed by mesenteric angiography (hopefully, he will not need that given very low GFR) -Colonoscopy 4/21 (scheduling constraints) for now.  Discussed risks and benefits. -OK with clear liquid diet.   Carmell Austria, MD Velora Heckler Fabienne Bruns [phone].

## 2019-06-11 NOTE — ED Provider Notes (Signed)
[facility] Provider Note   CSN: [id number] Arrival date & time: 06/11/19  0867     History Chief Complaint  Patient presents with  . Chest Pain    Johnny [name] is a 59 y.o. male.  HPI Patient presents with fatigue and shortness of breath.  Has had for last couple days.  States he had a bloody bowel movement yesterday.  Has had a history of GI bleeds.  He is not on blood thinners.  States he is nearing dialysis however.  States his nephrologist recently given some medicines to lower his potassium.  No abdominal pain.  No nausea or vomiting.  States he does not have a gastroenterologist.  States that he does not have a PCP but reviewing records has seen the family practice residents in the past.  States the nephrologist to most of the medical work now.    Past Medical History:  Diagnosis Date  . Acquired lymphedema 12/13/2017  . Chronic kidney disease    Chronic Kidney disease-stage unknown  . Diverticulosis of large intestine without hemorrhage 01/31/2012  . Essential HTN 04/21/2006  . Excess skin of thigh    left thigh adipose excess  . Hypertension   . OSA 05/21/2013   does not wear CPAP any longer  . 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 Wears dentures     Patient Active Problem List   Diagnosis Date Noted  . Acute kidney injury superimposed on CKD ([place]) 03/09/2019  . 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 Acquired lymphedema, bilateral thighs 12/13/2017  . Scrotal mass 12/13/2017  . Chronic kidney disease   . Hyperkalemia 08/27/2015  . Sleep apnea 05/21/2013  . Severe obesity (BMI >= 40) ([place]) 12/02/2011  . Venous stasis of lower extremity 12/02/2011  . LVH (left ventricular hypertrophy) 07/28/2009  . CKD stage 4 secondary to hypertension ([place]) 07/28/2009  . Essential hypertension 04/21/2006    Past Surgical History:  Procedure Laterality Date  . ABDOMINOPLASTY/PANNICULECTOMY WITH LIPOSUCTION Left 10/11/2018    Procedure: Excision of left thigh lipodystrophy;  Surgeon: Wallace Going, DO;  Location: [facility];  Service: Plastics;  Laterality: Left;  . COLONOSCOPY  01/31/2012   Procedure: COLONOSCOPY;  Surgeon: Inda Castle, MD;  Location: [facility];  Service: Endoscopy;  Laterality: N/A;  bmi is 93  . MULTIPLE TOOTH EXTRACTIONS    . NO PAST SURGERIES    . PANNICULECTOMY Bilateral 04/20/2018   Procedure: Excision of right inner thighs;  Surgeon: Wallace Going, DO;  Location: [facility];  Service: Plastics;  Laterality: Bilateral;  Excision of right inner thighs       Family History  Problem Relation Age of Onset  . Breast cancer Mother   . Hypertension Mother   . Colon cancer Father        died at 47  . Hypertension Brother     Social History   Tobacco Use  . Smoking status: Never Smoker  . Smokeless tobacco: Never Used  Substance Use Topics  . Alcohol use: Yes    Comment: social-once a week  . Drug use: No    Comment: stopped marijuana about Sep or Oct 2013    Home Medications Prior to Admission medications   Medication Sig Start Date End Date Taking? Authorizing Provider  albuterol (VENTOLIN HFA) 108 (90 Base) MCG/ACT inhaler Inhale 1-2 puffs into the lungs every 4 (four) hours as needed for wheezing or shortness of breath. 03/12/19   Kerney Elbe,  DO  ascorbic acid (VITAMIN C) 500 MG tablet Take 1 tablet (500 mg total) by mouth daily. 03/13/19   Raiford Noble Latif, DO  aspirin EC 81 MG tablet Take 81 mg by mouth daily.    [provider]  carvedilol (COREG) 25 MG tablet Take 1 tablet (25 mg total) by mouth 2 (two) times daily with a meal. 03/31/19   Mullis, Kiersten P, DO  furosemide (LASIX) 40 MG tablet Take 1 tablet (40 mg total) by mouth daily. 10/22/15   [place] Bing, DO  guaiFENesin-dextromethorphan (ROBITUSSIN DM) 100-10 MG/5ML syrup Take 10 mLs by mouth every 4 (four) hours as needed for cough. 03/12/19   Raiford Noble Latif, DO  Nutritional  Supplements (FEEDING SUPPLEMENT, NEPRO CARB STEADY,) LIQD Take 237 mLs by mouth 3 (three) times daily as needed (Supplement). 03/12/19   Sheikh, Omair Latif, DO  ondansetron (ZOFRAN) 4 MG tablet Take 1 tablet (4 mg total) by mouth every 6 (six) hours as needed for nausea. 03/12/19   Sheikh, Omair Latif, DO  RAYALDEE 30 MCG CPCR Take 30 mcg by mouth daily. 01/17/18   [provider]  sodium bicarbonate 650 MG tablet Take 1 tablet (650 mg total) by mouth 2 (two) times daily. 03/12/19   Raiford Noble Latif, DO  sodium zirconium cyclosilicate (LOKELMA) 10 g PACK packet Take 10 g by mouth daily. 03/13/19   Raiford Noble Latif, DO  zinc sulfate 220 (50 Zn) MG capsule Take 1 capsule (220 mg total) by mouth daily. 03/13/19   Raiford Noble Latif, DO    Allergies    Lisinopril  Review of Systems   Review of Systems  Constitutional: Negative for appetite change.  HENT: Negative for congestion.   Respiratory: Positive for shortness of breath.   Cardiovascular: Negative for chest pain.  Gastrointestinal: Positive for blood in stool.  Genitourinary: Negative for flank pain.  Skin: Negative for rash.  Neurological: Negative for weakness.  Psychiatric/Behavioral: Negative for confusion.    Physical Exam Updated Vital Signs BP (!) 111/48   Pulse 82   Temp 98.3 F (36.8 C) (Oral)   Resp 18   Ht 5\' 9"  (1.753 m)   Wt 131.5 kg   SpO2 100%   BMI 42.83 kg/m   Physical Exam Vitals and nursing note reviewed.  Cardiovascular:     Rate and Rhythm: Regular rhythm.  Pulmonary:     Breath sounds: No wheezing, rhonchi or rales.  Abdominal:     Tenderness: There is no abdominal tenderness.  Genitourinary:    Comments: Frank red blood on rectal exam.  No definite hemorrhoids palpated. Musculoskeletal:     Cervical back: Normal range of motion.     Right lower leg: No tenderness.     Left lower leg: No tenderness.  Skin:    Coloration: Skin is pale.  Neurological:     Mental Status: He is  alert and oriented to person, place, and time.     ED Results / Procedures / Treatments   Labs (all labs ordered are listed, but only abnormal results are displayed) Labs Reviewed  COMPREHENSIVE METABOLIC PANEL - Abnormal; Notable for the following components:      Result Value   CO2 16 (*)    Glucose, Bld 114 (*)    BUN 70 (*)    Creatinine, Ser 4.37 (*)    Calcium 7.9 (*)    Total Protein 5.5 (*)    Albumin 2.7 (*)    AST 13 (*)  GFR calc non Af Amer 14 (*)    GFR calc Af Amer 16 (*)    All other components within normal limits  CBC - Abnormal; Notable for the following components:   RBC 1.79 (*)    Hemoglobin 5.4 (*)    HCT 16.9 (*)    nRBC 0.3 (*)    All other components within normal limits  POC OCCULT BLOOD, ED  TYPE AND SCREEN    EKG EKG Interpretation  Date/Time:  Monday June 11 2019 09:19:34 EDT Ventricular Rate:  76 PR Interval:  164 QRS Duration: 82 QT Interval:  404 QTC Calculation: 454 R Axis:   39 Text Interpretation: Normal sinus rhythm Low voltage QRS Cannot rule out Anterior infarct , age undetermined Abnormal ECG Confirmed by Davonna Belling [id number]) on 06/11/2019 12:10:02 PM   Radiology No results found.  Procedures Procedures (including critical care time)  Medications Ordered in ED Medications - No data to display  ED Course  I have reviewed the triage vital signs and the nursing notes.  Pertinent labs & imaging results that were available during my care of the patient were reviewed by me and considered in my medical decision making (see chart for details).    MDM Rules/Calculators/A&P                      Patient presents with GI bleed.  History of GI bleeds.  Appears to been previously scoped vital of our GI but that was years ago.  States he does not have a gastroenterologist.  Also states he does not have a PCP but is seen family practice in the past.  Will discuss with them.  Hemoglobin 5.4.  Will require  transfusion.  CRITICAL CARE Performed by: Davonna Belling Total critical care time: 30 minutes Critical care time was exclusive of separately billable procedures and treating other patients. Critical care was necessary to treat or prevent imminent or life-threatening deterioration. Critical care was time spent personally by me on the following activities: development of treatment plan with patient and/or surrogate as well as nursing, discussions with consultants, evaluation of patient's response to treatment, examination of patient, obtaining history from patient or surrogate, ordering and performing treatments and interventions, ordering and review of laboratory studies, ordering and review of radiographic studies, pulse oximetry and re-evaluation of patient's condition.  Final Clinical Impression(s) / ED Diagnoses Final diagnoses:  Gastrointestinal hemorrhage, unspecified gastrointestinal hemorrhage type  Anemia, unspecified type    Rx / DC Orders ED Discharge Orders    None       Davonna Belling, MD 06/11/19 1245

## 2019-06-11 NOTE — H&P (Addendum)
[facility] Admission History and Physical Service Pager: [phone]  [name] Medical record number: [id number] Date of birth: Jul 12, 1960 Age: 59 y.o. Gender: male  Primary Care Provider: Danna Hefty, DO Consultants: GI Code Status: Full  Preferred Emergency Contact: Daughter, [id number]   Chief Complaint: Hematochezia/fatigue   Assessment and Plan: [name] is a 59 y.o. male presenting with fatigue/dyspnea in the setting of acute hematochezia. PMH is significant for diverticulosis, HTN, CKD stage IV, obesity, OSA, lipodystrophy.  Symptomatic anemia 2/2 Acute painless LGIB  Patient present with fatigue and DOE with recent BRBPR for 3 days. Has been light headed and dyspneic but without N/V, constipation/diarrhea, abdominal pain. No NSAID use. Last colonoscopy was 01/31/2012 w/ diverticulosis but otherwise unremarkable. Hbg 5.4 (hgb 10.3 in 02/2019) and BP 120/87 in ED. Was started on mIVF and waiting to received 2 units of packed RBCs after frank blood w/ rectal exam. GI consulted shortly following admission. On exam, appears tired and  has conjunctival pallor and a pale tongue. There are bowel sounds in all quadrants and abdomen is non tender. More likely due to an acute LGIB bleed given color characteristic. Potential sources could be AVM, Polyps, Diverticuliosis, malignancy, or internal hemorrhoids, with clinical presentation favoring a diverticular bleed. Patient is scheduled for colonoscopy 06/13/2019 @130pm . Will continue to monitor symptomatology along with hemoglobin s/p transfusion. Will place patient on clear liquid diet, bowel prep, and admit to progressive, FPTS. Consider ICU if patient becomes profoundly hypotensive depsite blood and fluid resuscitation. -Admit to FPTS, Progressive INP, Atttending Dr. Andria Frames -GI consulted, appreciate recommendation:   1. CLD  2. Colonoscopy 06/13/19 -Continue NS IVF 171mL/h x 12 hours  -Start  Moviprep tomorrow; CLD for now -AM CBC/RFP/PT-INR -f/u posttransfusion H/H -Monitor BP  AKI on CKD Stage IV  Cr 4.37 on admission. Baseline appears to be 3.4-3.9. Sees Dr. Posey Pronto at [facility]. Does not receive dialysis treatment as of yet. Suspect AKI in the setting of hypovolemia 2/2 GIB. Home medication regimen is Lokelma 10g 1x weekly on fridays, NaBicarb 650mg  BID, calcifediol 33mcg daily.  -Consider nephrology consult if not improving to baseline  -Continue NaBicarb -Hold lokelma and calcifediol for now -Continue to monitor on AM RFP -Avoid nephrotoxic drugs  Hypertension  BP 111/48 on admission. Repeat was 109/65, suspect will significantly improve with RBCs/fluids. Endorses lightheadness. Home medication is Coreg 25mg  daily.  -Restart home Coreg at reduced dose (6.25 BID) as to avoid rebound tachycardia/hypertension -Monitor BP for hypotension   OSA Does not wear CPAP. satting 100% RA.  -Continue Albuterol q4h PRN -Counseled on starting CPAP -pulse ox monitoring  Acquired lymphedema  lipodystrophy  Due to excessive weight loss. S/p excision and reduction of medial thighs. Last seen Dr. Marla Roe 03/30/2019 who recommended PT, compression stockings, and follow up in 3 months. Home medication is  Lasix 40mg  daily. -Hold lasix as patient is volume down, reassess daily  -Encourage compression stocking use -Follow up outpatient with Dr. Marla Roe -Vaseline for dry skin   FEN/GI: CLD, Moviprep Prophylaxis: SCDs   Disposition: Progressive, INP  History of Present Illness:  Johnny [name] is a 59 y.o. male presenting with new onset fatigue and dyspnea starting last week after noticing bright red stools.   States he initially noted a bright red bowel movement with blood in the toilet bowl on Friday morning, 4/17.  Has occasional straining with BM, not recently. Reports more episodes (around 2-3/day) of BRBPR over the weekend through today, so decided to  present to the ED for  evaluation. Has been feeling lightheaded/dyspneic after this started, otherwise denies having trouble with this complaint prior to this. Denies any abdominal pain, N/V, chest pain, hematuria, unexpected weight loss, fever, or difficulty urinating. Eating and drinking as normal. Denies history of GI bleed although has history of rectal bleeding 10/15/2016 where hgb was 11.4 and he was discharged with GI outpatient follow up. No known family history of IBD, reports father passed away of colon cancer at age 47.   ED Course: Hgb 5.4, Cr 4.37. Started on mIVF. Type and screen for 2U pRBCs. Rectal exam w/ frank red blood w/o palpated hemorrhoids.   Review Of Systems: Per HPI with the following additions:   Review of Systems  Constitutional: Negative for activity change, appetite change and fever.  Respiratory: Positive for shortness of breath. Negative for cough and chest tightness.   Cardiovascular: Negative for chest pain and palpitations.  Gastrointestinal: Positive for blood in stool. Negative for abdominal distention, abdominal pain and vomiting.  Genitourinary: Negative for dysuria and hematuria.  Musculoskeletal: Negative for myalgias.  Neurological: Positive for light-headedness. Negative for seizures, syncope, speech difficulty, weakness, numbness and headaches.     Patient Active Problem List   Diagnosis Date Noted  . Acute kidney injury superimposed on CKD ([place]) 03/09/2019  . 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 Acquired lymphedema, bilateral thighs 12/13/2017  . Scrotal mass 12/13/2017  . Chronic kidney disease   . Hyperkalemia 08/27/2015  . Sleep apnea 05/21/2013  . Severe obesity (BMI >= 40) ([place]) 12/02/2011  . Venous stasis of lower extremity 12/02/2011  . LVH (left ventricular hypertrophy) 07/28/2009  . CKD stage 4 secondary to hypertension ([place]) 07/28/2009  . Essential hypertension 04/21/2006   Past Medical History: Past Medical History:  Diagnosis  Date  . Acquired lymphedema 12/13/2017  . Chronic kidney disease    Chronic Kidney disease-stage unknown  . Diverticulosis of large intestine without hemorrhage 01/31/2012  . Essential HTN 04/21/2006  . Excess skin of thigh    left thigh adipose excess  . Hypertension   . OSA 05/21/2013   does not wear CPAP any longer  . 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 Wears dentures    Past Surgical History: Past Surgical History:  Procedure Laterality Date  . ABDOMINOPLASTY/PANNICULECTOMY WITH LIPOSUCTION Left 10/11/2018   Procedure: Excision of left thigh lipodystrophy;  Surgeon: Wallace Going, DO;  Location: [facility];  Service: Plastics;  Laterality: Left;  . COLONOSCOPY  01/31/2012   Procedure: COLONOSCOPY;  Surgeon: Inda Castle, MD;  Location: [facility];  Service: Endoscopy;  Laterality: N/A;  bmi is 68  . MULTIPLE TOOTH EXTRACTIONS    . NO PAST SURGERIES    . PANNICULECTOMY Bilateral 04/20/2018   Procedure: Excision of right inner thighs;  Surgeon: Wallace Going, DO;  Location: [place];  Service: Plastics;  Laterality: Bilateral;  Excision of right inner thighs   Social History: Social History   Tobacco Use  . Smoking status: Never Smoker  . Smokeless tobacco: Never Used  Substance Use Topics  . Alcohol use: Yes    Comment: social-once a week  . Drug use: No    Comment: stopped marijuana about Sep or Oct 2013   Additional social history: Non-smoker, occasional alcohol use.  Please also refer to relevant sections of EMR.  Family History: Family History  Problem Relation Age of Onset  . Breast cancer Mother   . Hypertension Mother   . Colon cancer  Father        died at 78  . Hypertension Brother    Allergies and Medications: Allergies  Allergen Reactions  . Lisinopril Swelling    Angioedema   No current facility-administered medications on file prior to encounter.   Current Outpatient Medications on File Prior to Encounter  Medication Sig Dispense Refill  .  albuterol (VENTOLIN HFA) 108 (90 Base) MCG/ACT inhaler Inhale 1-2 puffs into the lungs every 4 (four) hours as needed for wheezing or shortness of breath. 18 g 0  . ascorbic acid (VITAMIN C) 500 MG tablet Take 1 tablet (500 mg total) by mouth daily. 30 tablet 0  . aspirin EC 81 MG tablet Take 81 mg by mouth daily.    . carvedilol (COREG) 25 MG tablet Take 1 tablet (25 mg total) by mouth 2 (two) times daily with a meal. 60 tablet 2  . furosemide (LASIX) 40 MG tablet Take 1 tablet (40 mg total) by mouth daily. 10 tablet 0  . guaiFENesin-dextromethorphan (ROBITUSSIN DM) 100-10 MG/5ML syrup Take 10 mLs by mouth every 4 (four) hours as needed for cough. 118 mL 0  . Nutritional Supplements (FEEDING SUPPLEMENT, NEPRO CARB STEADY,) LIQD Take 237 mLs by mouth 3 (three) times daily as needed (Supplement). 1000 mL 0  . ondansetron (ZOFRAN) 4 MG tablet Take 1 tablet (4 mg total) by mouth every 6 (six) hours as needed for nausea. 20 tablet 0  . RAYALDEE 30 MCG CPCR Take 30 mcg by mouth daily.  1  . sodium bicarbonate 650 MG tablet Take 1 tablet (650 mg total) by mouth 2 (two) times daily. 60 tablet 0  . sodium zirconium cyclosilicate (LOKELMA) 10 g PACK packet Take 10 g by mouth daily. 1 packet 0  . zinc sulfate 220 (50 Zn) MG capsule Take 1 capsule (220 mg total) by mouth daily. 14 capsule 0   Objective: BP (!) 111/48   Pulse 82   Temp 98.3 F (36.8 C) (Oral)   Resp 18   Ht 5\' 9"  (1.753 m)   Wt 131.5 kg   SpO2 100%   BMI 42.83 kg/m  Exam:  General: Appears tired. No acute distress. Age appropriate. Eyes: PERRL. Conjunctival pallor.  ENTM: Pale white tongue otherwise unremarkable oropharynx Cardiovascular: RRR. Normal heart sounds. No murmurs.  Respiratory: CTAB. Normal effort. Gastrointestinal: +BS, mildly taught, non tender, no organomegaly MSK/Extremeties: Moves all limbs spontaneously. BLE w/ venous stasis skin changes.  Neuro: A&O x4   Labs and Imaging: CBC BMET  Recent Labs  Lab  06/11/19 1110  WBC 8.0  HGB 5.4*  HCT 16.9*  PLT 237   Recent Labs  Lab 06/11/19 1110  NA 136  K 5.1  CL 111  CO2 16*  BUN 70*  CREATININE 4.37*  GLUCOSE 114*  CALCIUM 7.9*     EKG: Normal sinus rhythm Low voltage QRS Cannot rule out Anterior infarct , age undetermined Abnormal ECG Vent. rate 76 BPM PR interval 164 ms QRS duration 82 ms QT/QTc 404/454 ms P-R-T axes 33 39 13  Autry-Lott, Simone, DO 06/11/2019, 4:17 PM PGY-1, [place] pager: [phone], text pages welcome  FPTS Upper-Level Resident Addendum   I have independently interviewed and examined the patient. I have discussed the above with the original author and agree with their documentation. My edits for correction/addition/clarification are in green. Please see also any attending notes.    Patriciaann Clan, DO  Family Medicine PGY-2

## 2019-06-11 NOTE — ED Triage Notes (Signed)
Pt arrives to ED with a C?O of rectal bleeding for 2 days pt states he has had about 3-4 bowel movements with bright red bleeding- pt denies any pain. Pt states yesterday he noticed with any exertion he was sob.

## 2019-06-11 NOTE — Consult Note (Addendum)
[facility] Consult: 2:14 PM 06/11/2019  LOS: 0 days    Referring Provider: Dr Higinio Plan MD resident int medicine Primary Care Physician:  Danna Hefty, DO Primary Gastroenterologist:  Dr Erskine Emery, performed colonoscopy 2013.    Reason for Consultation:  Painless hematochezia.  Blood loss anemia   HPI: Johnny [name] is a 59 y.o. male.  PMH CKD 4.  Obesity.  OSA, not on CPAP.   PE 2003, not on AC.  Lymphedema.  S/p panniculectomy of both thighs..   COVID-19 + pneumonia admission 1/14 -03/12/2019.  During admission he had AKI, hyperkalemia.  Anemia (Hgb 10.3) noted with low iron, low TIBC, ferritin 387, low folate, normal B12. Not on iron or folic acid.   01/2012 colonoscopy.  Screening study .  Showed diverticulosis in the descending, transverse, ascending colon.  Otherwise normal study.  Recommend repeat screening in 10 years.   Home meds include 81 ASA.    Presented to ED this morning with 2 days of painless hematochezia with stool, 3 episodes on Saturday another 3 episodes on Sunday.  Last episode was about 830 this morning and he feels like the interval between episodes has become longer but volume is still such that the commode water is completely red after he defecates.  Dizziness with activity.  No syncope.  No sweats, no chills.  No nausea, vomiting, abdominal pain.  No previous, even minor, bleeding per rectum.  Normally moves his bowels daily, brown stools.  No NSAIDs, just 81 mg aspirin daily. BP's low 100s/40s-60s.  Heart rate in the 70s.  Sats 100%.  Hgb 5.4, was 10.3 on 1/18.  MCV 94.  Normal platelets, normal WBCs.  2 PRBCs ordered. Normal LFTs. GFR 16, was 23 on 03/12/2019  Father had colon cancer in his 89s.  Rare alcohol consumption.  Past Medical History:  Diagnosis Date  .  Acquired lymphedema 12/13/2017  . Chronic kidney disease    Chronic Kidney disease-stage unknown  . Diverticulosis of large intestine without hemorrhage 01/31/2012  . Essential HTN 04/21/2006  . Excess skin of thigh    left thigh adipose excess  . Hypertension   . OSA 05/21/2013   does not wear CPAP any longer  . 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 Wears dentures     Past Surgical History:  Procedure Laterality Date  . ABDOMINOPLASTY/PANNICULECTOMY WITH LIPOSUCTION Left 10/11/2018   Procedure: Excision of left thigh lipodystrophy;  Surgeon: Wallace Going, DO;  Location: [facility];  Service: Plastics;  Laterality: Left;  . COLONOSCOPY  01/31/2012   Procedure: COLONOSCOPY;  Surgeon: Inda Castle, MD;  [facility]: [facility];  Service: Endoscopy;  Laterality: N/A;  bmi is 59  . MULTIPLE TOOTH EXTRACTIONS    . NO PAST SURGERIES    . PANNICULECTOMY Bilateral 04/20/2018   Procedure: Excision of right inner thighs;  Surgeon: Wallace Going, DO;  Location: [facility];  Service: Plastics;  Laterality: Bilateral;  Excision of right inner thighs    Prior to Admission medications   Medication Sig Start Date End Date Taking? Authorizing  Provider  albuterol (VENTOLIN HFA) 108 (90 Base) MCG/ACT inhaler Inhale 1-2 puffs into the lungs every 4 (four) hours as needed for wheezing or shortness of breath. 03/12/19  Yes Sheikh, Omair Latif, DO  ascorbic acid (VITAMIN C) 500 MG tablet Take 1 tablet (500 mg total) by mouth daily. 03/13/19  Yes Sheikh, Omair Latif, DO  aspirin EC 81 MG tablet Take 81 mg by mouth daily.   Yes [provider]  carvedilol (COREG) 25 MG tablet Take 1 tablet (25 mg total) by mouth 2 (two) times daily with a meal. 03/31/19  Yes Mullis, Kiersten P, DO  furosemide (LASIX) 40 MG tablet Take 1 tablet (40 mg total) by mouth daily. 10/22/15  Yes Fontanet Bing, DO  RAYALDEE 30 MCG CPCR Take 30 mcg by mouth daily. 01/17/18  Yes [provider]  sodium bicarbonate 650 MG  tablet Take 1 tablet (650 mg total) by mouth 2 (two) times daily. 03/12/19  Yes Sheikh, Omair Latif, DO  sodium zirconium cyclosilicate (LOKELMA) 10 g PACK packet Take 10 g by mouth daily. Patient taking differently: Take 10 g by mouth once a week. On Friday 03/13/19  Yes Sheikh, Omair Latif, DO  zinc sulfate 220 (50 Zn) MG capsule Take 1 capsule (220 mg total) by mouth daily. 03/13/19  Yes Sheikh, Omair Latif, DO  guaiFENesin-dextromethorphan (ROBITUSSIN DM) 100-10 MG/5ML syrup Take 10 mLs by mouth every 4 (four) hours as needed for cough. Patient not taking: Reported on 06/11/2019 03/12/19   Raiford Noble Latif, DO  Nutritional Supplements (FEEDING SUPPLEMENT, NEPRO CARB STEADY,) LIQD Take 237 mLs by mouth 3 (three) times daily as needed (Supplement). Patient not taking: Reported on 06/11/2019 03/12/19   Raiford Noble Latif, DO  ondansetron (ZOFRAN) 4 MG tablet Take 1 tablet (4 mg total) by mouth every 6 (six) hours as needed for nausea. Patient not taking: Reported on 06/11/2019 03/12/19   Raiford Noble Latif, DO    Scheduled Meds: . sodium bicarbonate  650 mg Oral BID   Infusions: . sodium chloride    . sodium chloride     PRN Meds: acetaminophen **OR** acetaminophen, albuterol   Allergies as of 06/11/2019 - Review Complete 06/11/2019  Allergen Reaction Noted  . Lisinopril Swelling 09/05/2014    Family History  Problem Relation Age of Onset  . Breast cancer Mother   . Hypertension Mother   . Colon cancer Father        died at 30  . Hypertension Brother     Social History   Socioeconomic History  . Marital status: Single    Spouse name: Not on file  . Number of children: 1  . Years of education: Not on file  . Highest education level: Not on file  Occupational History  . Occupation: disabled    Fish farm manager: UNEMPLOYED  Tobacco Use  . Smoking status: Never Smoker  . Smokeless tobacco: Never Used  Substance and Sexual Activity  . Alcohol use: Yes    Comment: social-once a  week  . Drug use: No    Comment: stopped marijuana about Sep or Oct 2013  . Sexual activity: Not on file  Other Topics Concern  . Not on file  Social History Narrative  . Not on file   Social Determinants of Health   Financial Resource Strain:   . Difficulty of Paying Living Expenses:   Food Insecurity:   . Worried About Charity fundraiser in the Last Year:   . [employer]  in the Last Year:   Transportation Needs:   . Film/video editor (Medical):   Marland Kitchen Lack of Transportation (Non-Medical):   Physical Activity:   . Days of Exercise per Week:   . Minutes of Exercise per Session:   Stress:   . Feeling of Stress :   Social Connections:   . Frequency of Communication with Friends and Family:   . Frequency of Social Gatherings with Friends and Family:   . Attends Religious Services:   . Active Member of Clubs or Organizations:   . Attends Archivist Meetings:   Marland Kitchen Marital Status:   Intimate Partner Violence:   . Fear of Current or Ex-Partner:   . Emotionally Abused:   Marland Kitchen Physically Abused:   . Sexually Abused:     REVIEW OF SYSTEMS: Constitutional: No fatigue, no weakness. ENT:  No nose bleeds Pulm: Slight dyspnea.  No cough.  No lingering respiratory symptoms from his January 2021 Covid. CV:  No palpitations, no LE edema.  No angina GU: Still makes a lot of urine.  No hematuria, no frequency GI: No heartburn, no dysphagia.  See HPI. Heme: No excessive or unusual bleeding or bruising. Transfusions: First unit PRBC transfusing now. Neuro: No syncope.  Just dizziness.  No headaches, no peripheral tingling or numbness Derm:  No itching, no rash or sores.  Endocrine:  No sweats or chills.  No polyuria or dysuria Immunization: Reviewed. Travel:  None beyond local counties in last few months.    PHYSICAL EXAM: Vital signs in last 24 hours: Vitals:   06/11/19 1215 06/11/19 1400  BP: (!) 111/48 109/65  Pulse: 82 73  Resp: 18 15  Temp:    SpO2: 100% 100%    Wt Readings from Last 3 Encounters:  06/11/19 131.5 kg  03/30/19 (!) 143.1 kg  03/12/19 134.2 kg    General: This Head: No facial asymmetry or swelling.  No signs of head trauma. Eyes: No scleral icterus.  No conjunctival pallor.  EOMI. Ears: Not hard of hearing. Nose: No discharge, no congestion Mouth: Oral mucosa moist, pink, clear.  Tongue midline.  Good dentition. Neck: No masses, thyromegaly, JVD. Lungs: No labored breathing, no cough.  Lungs clear bilaterally. Heart: RRR.  No MRG.  S1, S2 present. Abdomen: Obese, soft, nontender.  Active bowel sounds.  No HSM, bruits, hernias appreciated..   Rectal: Not performed. Musc/Skeltl: No joint deformity. Extremities: Edema in the lower legs, feet and ankles. Neurologic: Alert.  Oriented x3.  Good historian.  No tremors or gross weakness.  No gross deficits.  Moves all 4 limbs. Skin: No open sores, rashes or suspicious lesions. Tattoos: None observed Nodes: No cervical adenopathy Psych: Calm, pleasant, cooperative.  Fluid speech.  Intake/Output from previous day: No intake/output data recorded. Intake/Output this shift: No intake/output data recorded.  LAB RESULTS: Recent Labs    06/11/19 1110  WBC 8.0  HGB 5.4*  HCT 16.9*  PLT 237   BMET Lab Results  Component Value Date   NA 136 06/11/2019   NA 135 03/12/2019   NA 134 (L) 03/11/2019   K 5.1 06/11/2019   K 4.7 03/12/2019   K 4.8 03/11/2019   CL 111 06/11/2019   CL 101 03/12/2019   CL 103 03/11/2019   CO2 16 (L) 06/11/2019   CO2 23 03/12/2019   CO2 21 (L) 03/11/2019   GLUCOSE 114 (H) 06/11/2019   GLUCOSE 140 (H) 03/12/2019   GLUCOSE 161 (H) 03/11/2019   BUN 70 (H)  06/11/2019   BUN 70 (H) 03/12/2019   BUN 75 (H) 03/11/2019   CREATININE 4.37 (H) 06/11/2019   CREATININE 3.33 (H) 03/12/2019   CREATININE 3.45 (H) 03/11/2019   CALCIUM 7.9 (L) 06/11/2019   CALCIUM 8.6 (L) 03/12/2019   CALCIUM 8.7 (L) 03/11/2019   LFT Recent Labs    06/11/19 1110  PROT  5.5*  ALBUMIN 2.7*  AST 13*  ALT 11  ALKPHOS 42  BILITOT 0.4   PT/INR Lab Results  Component Value Date   INR 2.2 12/28/2010   INR 2.2 11/30/2010   INR 2.6 11/02/2010   Hepatitis Panel No results for input(s): HEPBSAG, HCVAB, HEPAIGM, HEPBIGM in the last 72 hours. C-Diff No components found for: CDIFF Lipase  No results found for: LIPASE  Drugs of Abuse     Component Value Date/Time   LABOPIA NONE DETECTED 11/08/2009 0149   COCAINSCRNUR NONE DETECTED 11/08/2009 0149   LABBENZ NONE DETECTED 11/08/2009 0149   AMPHETMU NONE DETECTED 11/08/2009 0149   THCU NONE DETECTED 11/08/2009 0149   LABBARB  11/08/2009 0149    NONE DETECTED        DRUG SCREEN FOR MEDICAL PURPOSES ONLY.  IF CONFIRMATION IS NEEDED FOR ANY PURPOSE, NOTIFY LAB WITHIN 5 DAYS.        LOWEST DETECTABLE LIMITS FOR URINE DRUG SCREEN Drug Class       Cutoff (ng/mL) Amphetamine      1000 Barbiturate      200 Benzodiazepine   568 Tricyclics       127 Opiates          300 Cocaine          300 THC              50     RADIOLOGY STUDIES: No results found.   IMPRESSION:   *   Painless hematochezia.  By history this sounds like diverticular bleeding.  Known diverticulosis based on 2013 colonoscopy.  *    ABL anemia superimposed on anemia of chronic disease as well as anemia from folate deficiency.  *    Advanced CKD, currently stage V, no oliguria.    PLAN:     *   Allow clear liquids today and tomorrow.  Colonoscopy set for about 130 on Wednesday afternoon 06/13/2019.  Patient agreeable to proceed.  *    Follow-up Hgb after transfusion and in AM.   Check INR in Am.    *   This is a lower GI bleed therefore I stopped the IV Protonix.   Azucena Freed  06/11/2019, 2:14 PM Phone [phone]     Attending physician's note   I have taken an interval history, reviewed the chart and examined the patient. I agree with the Advanced Practitioner's note, impression and recommendations.   Likely  diverticular bleed. Had colon 2013 showing pancolonic diverticulosis. HD stable Acute on chronic anemia. Hb 10 to 5.4 s/p 2U Comorbid conditions include morbid obesity, OSA on CPAP, CKD 4 (GFR 55ml/min)  Plan: -Trend CBC.  Keep Hb>7. -If active brisk bleeding, RBC scan followed by mesenteric angiography (hopefully, he will not need that given very low GFR) -Colonoscopy 4/21 (scheduling constraints) for now.  Discussed risks and benefits. -OK with clear liquid diet.   Carmell Austria, MD Velora Heckler Fabienne Bruns [phone].

## 2019-06-12 ENCOUNTER — Encounter (HOSPITAL_COMMUNITY): Payer: Self-pay | Admitting: Family Medicine

## 2019-06-12 DIAGNOSIS — D649 Anemia, unspecified: Secondary | ICD-10-CM | POA: Diagnosis not present

## 2019-06-12 DIAGNOSIS — K922 Gastrointestinal hemorrhage, unspecified: Secondary | ICD-10-CM | POA: Diagnosis not present

## 2019-06-12 LAB — CBC WITH DIFFERENTIAL/PLATELET
Abs Immature Granulocytes: 0.06 10*3/uL (ref 0.00–0.07)
Basophils Absolute: 0 10*3/uL (ref 0.0–0.1)
Basophils Relative: 0 %
Eosinophils Absolute: 0.1 10*3/uL (ref 0.0–0.5)
Eosinophils Relative: 1 %
HCT: 17.8 % — ABNORMAL LOW (ref 39.0–52.0)
Hemoglobin: 5.7 g/dL — CL (ref 13.0–17.0)
Immature Granulocytes: 1 %
Lymphocytes Relative: 11 %
Lymphs Abs: 1.2 10*3/uL (ref 0.7–4.0)
MCH: 30.6 pg (ref 26.0–34.0)
MCHC: 32 g/dL (ref 30.0–36.0)
MCV: 95.7 fL (ref 80.0–100.0)
Monocytes Absolute: 0.7 10*3/uL (ref 0.1–1.0)
Monocytes Relative: 7 %
Neutro Abs: 8.5 10*3/uL — ABNORMAL HIGH (ref 1.7–7.7)
Neutrophils Relative %: 80 %
Platelets: 209 10*3/uL (ref 150–400)
RBC: 1.86 MIL/uL — ABNORMAL LOW (ref 4.22–5.81)
RDW: 14.8 % (ref 11.5–15.5)
WBC: 10.6 10*3/uL — ABNORMAL HIGH (ref 4.0–10.5)
nRBC: 0.6 % — ABNORMAL HIGH (ref 0.0–0.2)

## 2019-06-12 LAB — RENAL FUNCTION PANEL
Albumin: 2.3 g/dL — ABNORMAL LOW (ref 3.5–5.0)
Anion gap: 8 (ref 5–15)
BUN: 67 mg/dL — ABNORMAL HIGH (ref 6–20)
CO2: 14 mmol/L — ABNORMAL LOW (ref 22–32)
Calcium: 7.5 mg/dL — ABNORMAL LOW (ref 8.9–10.3)
Chloride: 114 mmol/L — ABNORMAL HIGH (ref 98–111)
Creatinine, Ser: 3.94 mg/dL — ABNORMAL HIGH (ref 0.61–1.24)
GFR calc Af Amer: 18 mL/min — ABNORMAL LOW
GFR calc non Af Amer: 16 mL/min — ABNORMAL LOW
Glucose, Bld: 112 mg/dL — ABNORMAL HIGH (ref 70–99)
Phosphorus: 4.4 mg/dL (ref 2.5–4.6)
Potassium: 4.9 mmol/L (ref 3.5–5.1)
Sodium: 136 mmol/L (ref 135–145)

## 2019-06-12 LAB — HEMOGLOBIN AND HEMATOCRIT, BLOOD
HCT: 18.2 % — ABNORMAL LOW (ref 39.0–52.0)
HCT: 19.9 % — ABNORMAL LOW (ref 39.0–52.0)
HCT: 23.6 % — ABNORMAL LOW (ref 39.0–52.0)
Hemoglobin: 5.9 g/dL — CL (ref 13.0–17.0)
Hemoglobin: 5.9 g/dL — CL (ref 13.0–17.0)
Hemoglobin: 7.8 g/dL — ABNORMAL LOW (ref 13.0–17.0)

## 2019-06-12 LAB — PROTIME-INR
INR: 1.2 (ref 0.8–1.2)
Prothrombin Time: 15.1 s (ref 11.4–15.2)

## 2019-06-12 LAB — PREPARE RBC (CROSSMATCH)

## 2019-06-12 LAB — HEMOGLOBIN A1C
Hgb A1c MFr Bld: 5.3 % (ref 4.8–5.6)
Mean Plasma Glucose: 105.41 mg/dL

## 2019-06-12 MED ORDER — METOCLOPRAMIDE HCL 5 MG/ML IJ SOLN
10.0000 mg | Freq: Once | INTRAMUSCULAR | Status: AC
  Administered 2019-06-13: 10 mg via INTRAVENOUS
  Filled 2019-06-12: qty 2

## 2019-06-12 MED ORDER — SODIUM CHLORIDE 0.9% IV SOLUTION: Freq: Once | INTRAVENOUS | Status: DC

## 2019-06-12 MED ORDER — SODIUM CHLORIDE 0.9% IV SOLUTION: Freq: Once | INTRAVENOUS | Status: AC

## 2019-06-12 MED ORDER — SODIUM CHLORIDE 0.9 % IV SOLN: INTRAVENOUS | Status: DC

## 2019-06-12 MED ORDER — METOCLOPRAMIDE HCL 5 MG/ML IJ SOLN
10.0000 mg | Freq: Once | INTRAMUSCULAR | Status: AC
  Administered 2019-06-12: 10 mg via INTRAVENOUS
  Filled 2019-06-12: qty 2

## 2019-06-12 NOTE — Progress Notes (Signed)
Family Medicine Teaching Service Daily Progress Note Intern Pager: [phone]  [name] Medical record number: [id number] Date of birth: Jul 14, 1960 Age: 59 y.o. Gender: male  Primary Care Provider: Danna Hefty, DO Consultants: GI Code Status: [place]  Pt Overview and Major Events to Date: 4/19 Admitted, GI consulted  Assessment and Plan: Johnny [name] is a 59 y.o. male presenting with fatigue/dyspnea in the setting of acute hematochezia. PMH is significant for diverticulosis, HTN, CKD stage IV, obesity, OSA, lipodystrophy.  Symptomatic anemia 2/2 Acute painless LGIB  Improved symptomatically but hemoglobin remains low with clear PO and 3U of pRBCs. Had 2 bloody stools overnight hgb 5.9 s/p 2U pRBCs. Hgb 5.7 this morning following an additional unit. Will get another 2 units. Will follow up posttransfusion H/H. Consider fresh frozen plasma with next transfusion. -GI consulted, appreciate recommendation:              1. CLD, Moviprep, NPO @ [facility]              2. Colonoscopy 06/13/19; potential RBC scan -Consider IVF when NPO -AM CBC/RFP/PT-INR -f/u posttransfusion H/H -Monitor BP  AKI on CKD Stage IV  Cr 4.37 on admission, AM labs 3.94. Baseline appears to be 3.4-3.9. Sees Dr. Posey Pronto at [facility]. Does not receive dialysis treatment as of yet. Suspect AKI in the setting of hypovolemia 2/2 GIB. Home medication regimen is Lokelma 10g 1x weekly on fridays, NaBicarb 650mg  BID, calcifediol 41mcg daily.  -Continue NaBicarb -Hold lokelma and calcifediol for now -Continue to monitor on AM RFP -Avoid nephrotoxic drugs  Hypertension  BP 117/71 Home medication is Coreg 25mg  daily.  -Continue Coreg at reduced dose of 6.25 BID  -Monitor BP for hypotension   OSA Does not wear CPAP. satting 100% RA.  -Continue Albuterol q4h PRN -Counseled on starting CPAP -pulse ox monitoring  Acquired lymphedema  lipodystrophy  Due to excessive weight loss. S/p excision and  reduction of medial thighs. Last seen Dr. Marla Roe 03/30/2019 who recommended PT, compression stockings, and follow up in 3 months. Home medication is  Lasix 40mg  daily. -Hold lasix as patient is volume down, reassess daily  -Encourage compression stocking use -Follow up outpatient with Dr. Marla Roe -Vaseline for dry skin   FEN/GI: CLD, Moviprep Prophylaxis: SCDs   Disposition: Progressive, INP; home pending further medical work up  Subjective:  Feels better than yesterday. No longer endorses light headedness or shortness of breath. Walked to the restroom without difficulty.   Objective: Temp:  [97.5 F (36.4 C)-98.3 F (36.8 C)] 97.9 F (36.6 C) (04/20 0402) Pulse Rate:  [67-146] 74 (04/20 0715) Resp:  [12-21] 21 (04/20 0715) BP: (92-137)/(48-126) 117/71 (04/20 0715) SpO2:  [98 %-100 %] 100 % (04/20 0715) Weight:  [131.5 kg] 131.5 kg (04/19 0913)  Physical Exam: General: Appears well, no acute distress. Age appropriate. Cardiac: RRR, normal heart sounds, no murmurs Respiratory: CTAB, normal effort Abdomen: soft, nontender, moderately distended, +BS  Laboratory: Recent Labs  Lab 06/11/19 1110 06/11/19 1110 06/11/19 2330 06/12/19 0033 06/12/19 0743  WBC 8.0  --   --   --  10.6*  HGB 5.4*   < > 5.9* 5.9* 5.7*  HCT 16.9*   < > 19.9* 18.2* 17.8*  PLT 237  --   --   --  209   < > = values in this interval not displayed.   Recent Labs  Lab 06/11/19 1110 06/12/19 0743  NA 136 136  K 5.1 4.9  CL 111 114*  CO2 16* 14*  BUN 70* 67*  CREATININE 4.37* 3.94*  CALCIUM 7.9* 7.5*  PROT 5.5*  --   BILITOT 0.4  --   ALKPHOS 42  --   ALT 11  --   AST 13*  --   GLUCOSE 114* 112*    Imaging/Diagnostic Tests: Now new imaging at this time.   Gerlene Fee, DO 06/12/2019, 7:29 AM PGY-1, [place] Intern pager: [phone], text pages welcome

## 2019-06-12 NOTE — ED Notes (Signed)
Help get patient moved over to a hospital patient did well patient is back on the monitor call bell in reach

## 2019-06-12 NOTE — Hospital Course (Addendum)
Johnny [name] is a 59 y.o. male who presented with fatigue/dyspnea in the setting of acute hematochezia. PMH is significant for diverticulosis, HTN, CKD stage IV, obesity, OSA, lipodystrophy.   Symptomatic anemia 2/2 Acute painless LGIB  Patient present with fatigue and DOE with recent BRBPR for 3 days. Hbg 5.4 in ED. Was started on mIVF and received 2 units of packed RBCs after frank blood w/ rectal exam. GI consulted shortly following admission. Colonoscopy 06/13/19 showed that the patient likely had diverticular bleed from right colonic diverticulosis that stopped. Pancolonic diverticulosis predominantly in the sigmoid colon. One 6 mm polyp in the proximal transverse colon (not removed to avoid complicating current clinical picture). The examined portion of the ileum was normal. Non-bleeding internal hemorrhoids. At time of discharged, Hgb trended up to 9.0 s/p 7U pRBCs.

## 2019-06-12 NOTE — ED Notes (Signed)
This RN paged the Provider regarding ths pts most recent Blood Specimen results.

## 2019-06-12 NOTE — Progress Notes (Signed)
PT Cancellation Note  Patient Details Name: Johnny [name] MRN: [id number] DOB: 1961-02-08   Cancelled Treatment:    Reason Eval/Treat Not Completed: Medical issues which prohibited therapy  Pt with hgb of 5.7 at this time and getting PRBC.  Will follow up at later time. [name], PT Acute Rehab Services Pager [phone] [facility] [phone]    [name] 06/12/2019, 11:25 AM

## 2019-06-12 NOTE — Progress Notes (Addendum)
Daily Rounding Note  06/12/2019, 9:29 AM  LOS: 1 day   SUBJECTIVE:   Chief complaint:  Anemia.  Painless hematochezia.     3 episodes hemechezia yest, 1 this AM.  Feels ok, less weak and dizzy this AM when he walked to bathroom.  No pain.  No SOB  OBJECTIVE:         Vital signs in last 24 hours:    Temp:  [97.5 F (36.4 C)-98.3 F (36.8 C)] 97.9 F (36.6 C) (04/20 0402) Pulse Rate:  [67-146] 73 (04/20 0915) Resp:  [12-22] 19 (04/20 0915) BP: (92-137)/(48-126) 111/74 (04/20 0915) SpO2:  [98 %-100 %] 100 % (04/20 0915)   Filed Weights   06/11/19 0913  Weight: 131.5 kg   General: Looks well   Heart: RRR Chest: clear bil.  No SOB Abdomen: soft, obese, NT, ND  Extremities: no CCE Neuro/Psych:  Oriented x 3, no weakness, tremors or deficits.    Intake/Output from previous day: 04/19 0701 - 04/20 0700 In: 2473.3 [I.V.:1193.3; Blood:1280] Out: -   Intake/Output this shift: No intake/output data recorded.  Lab Results: Recent Labs    06/11/19 1110 06/11/19 1110 06/11/19 2330 06/12/19 0033 06/12/19 0743  WBC 8.0  --   --   --  10.6*  HGB 5.4*   < > 5.9* 5.9* 5.7*  HCT 16.9*   < > 19.9* 18.2* 17.8*  PLT 237  --   --   --  209   < > = values in this interval not displayed.   BMET Recent Labs    06/11/19 1110 06/12/19 0743  NA 136 136  K 5.1 4.9  CL 111 114*  CO2 16* 14*  GLUCOSE 114* 112*  BUN 70* 67*  CREATININE 4.37* 3.94*  CALCIUM 7.9* 7.5*   LFT Recent Labs    06/11/19 1110 06/12/19 0743  PROT 5.5*  --   ALBUMIN 2.7* 2.3*  AST 13*  --   ALT 11  --   ALKPHOS 42  --   BILITOT 0.4  --    PT/INR Recent Labs    06/12/19 0743  LABPROT 15.1  INR 1.2   Hepatitis Panel No results for input(s): HEPBSAG, HCVAB, HEPAIGM, HEPBIGM in the last 72 hours.  Studies/Results: No results found.  Scheduled Meds: . sodium chloride   Intravenous Once  . bisacodyl  20 mg Oral Once  . carvedilol   6.25 mg Oral BID WC  . peg 3350 powder  0.5 kit Oral Once   And  . [START ON 06/13/2019] peg 3350 powder  0.5 kit Oral Once  . sodium bicarbonate  650 mg Oral BID   Continuous Infusions: PRN Meds:.acetaminophen **OR** acetaminophen, albuterol, white petrolatum   ASSESMENT:   *   Painless hematochezia.  ongoing    *    ABL anemia superimposed on anemia of chronic disease as well as anemia from folate deficiency. Hgb has not budged much despite PRBC.   Hgb 5.4 >> 5.9 >> 5.7 after total of 3 PRBCs INR 1.2  *    Advanced CKD, currently stage V, no oliguria.   PLAN   *   Colonoscopy 4/21 Prep tonite and early AM.  Clears.   *   I ordered 2 more PRBCs.  Hgb at 2 PM.  *    ? nuc med RBC bleeding scan, but if Positive would lead to CT angio and not clear CT is safe for his  kidneys.       Azucena Freed  06/12/2019, 9:29 AM Phone [phone]   Attending physician's note   I have taken an interval history, reviewed the chart and examined the patient. I agree with the Advanced Practitioner's note, impression and recommendations.   No further bleeding S/P 5U PRBC to Hb 7.8 Has started drinking preparation for colon in AM  Plan: -Colonoscopy in a.m. Discussed risks and benefits. -Trend Hb. Keep Hb>7.  Carmell Austria, MD Velora Heckler Fabienne Bruns [phone].

## 2019-06-12 NOTE — ED Notes (Signed)
Lunch Tray Ordered @ 1032. 

## 2019-06-12 NOTE — ED Notes (Signed)
Patient is getting a unit of blood

## 2019-06-13 ENCOUNTER — Encounter (HOSPITAL_COMMUNITY)
Admission: EM | Disposition: A | Discharge status: Home / Self Care | Payer: Self-pay | Source: Home / Self Care | Attending: Family Medicine

## 2019-06-13 ENCOUNTER — Inpatient Hospital Stay (HOSPITAL_COMMUNITY): Payer: Medicaid Other | Admitting: Certified Registered"

## 2019-06-13 ENCOUNTER — Encounter (HOSPITAL_COMMUNITY): Payer: Self-pay | Admitting: Family Medicine

## 2019-06-13 DIAGNOSIS — K625 Hemorrhage of anus and rectum: Secondary | ICD-10-CM

## 2019-06-13 DIAGNOSIS — N179 Acute kidney failure, unspecified: Secondary | ICD-10-CM

## 2019-06-13 DIAGNOSIS — K635 Polyp of colon: Secondary | ICD-10-CM

## 2019-06-13 HISTORY — PX: COLONOSCOPY WITH PROPOFOL: SHX5780

## 2019-06-13 LAB — BASIC METABOLIC PANEL
Anion gap: 13 (ref 5–15)
BUN: 57 mg/dL — ABNORMAL HIGH (ref 6–20)
CO2: 11 mmol/L — ABNORMAL LOW (ref 22–32)
Calcium: 7.9 mg/dL — ABNORMAL LOW (ref 8.9–10.3)
Chloride: 115 mmol/L — ABNORMAL HIGH (ref 98–111)
Creatinine, Ser: 3.72 mg/dL — ABNORMAL HIGH (ref 0.61–1.24)
GFR calc Af Amer: 20 mL/min — ABNORMAL LOW (ref >60)
GFR calc non Af Amer: 17 mL/min — ABNORMAL LOW (ref >60)
Glucose, Bld: 103 mg/dL — ABNORMAL HIGH (ref 70–99)
Potassium: 4.8 mmol/L (ref 3.5–5.1)
Sodium: 139 mmol/L (ref 135–145)

## 2019-06-13 LAB — CBC
HCT: 21.3 % — ABNORMAL LOW (ref 39.0–52.0)
HCT: 24.9 % — ABNORMAL LOW (ref 39.0–52.0)
Hemoglobin: 7 g/dL — ABNORMAL LOW (ref 13.0–17.0)
Hemoglobin: 8.2 g/dL — ABNORMAL LOW (ref 13.0–17.0)
MCH: 29.5 pg (ref 26.0–34.0)
MCH: 30.7 pg (ref 26.0–34.0)
MCHC: 32.9 g/dL (ref 30.0–36.0)
MCHC: 32.9 g/dL (ref 30.0–36.0)
MCV: 89.9 fL (ref 80.0–100.0)
MCV: 93.3 fL (ref 80.0–100.0)
Platelets: 186 10*3/uL (ref 150–400)
Platelets: 224 10*3/uL (ref 150–400)
RBC: 2.37 MIL/uL — ABNORMAL LOW (ref 4.22–5.81)
RBC: 2.67 MIL/uL — ABNORMAL LOW (ref 4.22–5.81)
RDW: 15.8 % — ABNORMAL HIGH (ref 11.5–15.5)
RDW: 16.2 % — ABNORMAL HIGH (ref 11.5–15.5)
WBC: 7.2 10*3/uL (ref 4.0–10.5)
WBC: 6.9 10*3/uL (ref 4.0–10.5)
nRBC: 0.6 % — ABNORMAL HIGH (ref 0.0–0.2)
nRBC: 0.7 % — ABNORMAL HIGH (ref 0.0–0.2)

## 2019-06-13 SURGERY — COLONOSCOPY WITH PROPOFOL
Anesthesia: Monitor Anesthesia Care

## 2019-06-13 MED ORDER — SODIUM BICARBONATE 650 MG PO TABS
1300.0000 mg | ORAL_TABLET | Freq: Two times a day (BID) | ORAL | Status: DC
  Administered 2019-06-13 – 2019-06-15 (×4): 1300 mg via ORAL
  Filled 2019-06-13 (×4): qty 2

## 2019-06-13 MED ORDER — SODIUM BICARBONATE 650 MG PO TABS
650.0000 mg | ORAL_TABLET | Freq: Once | ORAL | Status: AC
  Administered 2019-06-13: 650 mg via ORAL
  Filled 2019-06-13: qty 1

## 2019-06-13 MED ORDER — PHENYLEPHRINE 40 MCG/ML (10ML) SYRINGE FOR IV PUSH (FOR BLOOD PRESSURE SUPPORT)
PREFILLED_SYRINGE | INTRAVENOUS | Status: DC | PRN
  Administered 2019-06-13: 80 ug via INTRAVENOUS

## 2019-06-13 MED ORDER — PROPOFOL 500 MG/50ML IV EMUL
INTRAVENOUS | Status: DC | PRN
  Administered 2019-06-13: 100 ug/kg/min via INTRAVENOUS

## 2019-06-13 MED ORDER — SODIUM CHLORIDE 0.9 % IV SOLN: INTRAVENOUS | Status: DC | PRN

## 2019-06-13 SURGICAL SUPPLY — 22 items

## 2019-06-13 NOTE — Transfer of Care (Signed)
Immediate Anesthesia Transfer of Care Note  Patient: Johnny [name]  Procedure(s) Performed: COLONOSCOPY WITH PROPOFOL (N/A )  Patient Location: Endoscopy Unit  Anesthesia Type:MAC  Level of Consciousness: awake and alert   Airway & Oxygen Therapy: Patient Spontanous Breathing  Post-op Assessment: Report given to RN and Post -op Vital signs reviewed and stable  Post vital signs: Reviewed and stable  Last Vitals:  Vitals Value Taken Time  BP 111/63 06/13/19 1606  Temp 36.6 C 06/13/19 1606  Pulse 87 06/13/19 1606  Resp 19 06/13/19 1606  SpO2 100 % 06/13/19 1606    Last Pain:  Vitals:   06/13/19 1606  TempSrc: Axillary  PainSc: 0-No pain         Complications: No apparent anesthesia complications

## 2019-06-13 NOTE — Evaluation (Signed)
Occupational Therapy Evaluation Patient Details Name: Johnny [name] MRN: [id number] DOB: 05/11/1960 Today's Date: 06/13/2019    History of Present Illness  59 y.o. male presenting with fatigue/dyspnea in the setting of acute hematochezia. PMH is significant for diverticulosis, HTN, CKD stage IV, obesity, OSA, lipodystrophy. Presented to ED with increased fatigue. Admitted 06/11/19 for symptomatic anemia 2/2 acute painless LGIB.   Clinical Impression   PTA pt living independently at home and in community, was lifting weights and very active. At time of eval, pt demonstrates ability to complete BADL at mod I level. He showed ability to complete functional mobility, standing at sink for wash up, and toilet transfer all without external assist. VSS on RA, no DOE noted. Educated pt on higher level ECS strategies, in regards to returning to IADL activities. Educated pt on how to pace self when it comes to returning to weight lifting and community based tasks. No further OT needs identified, OT will sign off. Thank you for this consult.    Follow Up Recommendations  No OT follow up    Equipment Recommendations  None recommended by OT    Recommendations for Other Services       Precautions / Restrictions Precautions Precautions: None Restrictions Weight Bearing Restrictions: No      Mobility Bed Mobility Overal bed mobility: Independent                Transfers Overall transfer level: Independent                    Balance Overall balance assessment: No apparent balance deficits (not formally assessed)                                         ADL either performed or assessed with clinical judgement   ADL Overall ADL's : Modified independent;At baseline                                       General ADL Comments: Pt demonstrates ability to complete BADL at mod I level. Pt was able to stand at the sink to complete UB/LB bathing without  LOB or DOE. Pt also completing functional mobility without any external assist or LOB.     Vision Patient Visual Report: No change from baseline       Perception     Praxis      Pertinent Vitals/Pain Pain Assessment: No/denies pain     Hand Dominance Right   Extremity/Trunk Assessment Upper Extremity Assessment Upper Extremity Assessment: Overall WFL for tasks assessed   Lower Extremity Assessment Lower Extremity Assessment: Overall WFL for tasks assessed       Communication Communication Communication: No difficulties   Cognition Arousal/Alertness: Awake/alert Behavior During Therapy: WFL for tasks assessed/performed Overall Cognitive Status: Within Functional Limits for tasks assessed                                     General Comments       Exercises     Shoulder Instructions      Home Living Family/patient expects to be discharged to:: Private residence Living Arrangements: Other relatives Available Help at Discharge: Family;Available 24 hours/day Type of Home: House Home Access: Stairs to  enter Entrance Stairs-Number of Steps: 4 Entrance Stairs-Rails: Can reach both Home Layout: One level     Bathroom Shower/Tub: Teacher, early years/pre: Standard Bathroom Accessibility: Yes   Home Equipment: Walker - 2 wheels;Cane - single point;Grab bars - tub/shower;Hand held shower head          Prior Functioning/Environment Level of Independence: Independent        Comments: works out a couple time a week, walks a few miles and lifts weights        OT Problem List: Decreased activity tolerance;Cardiopulmonary status limiting activity      OT Treatment/Interventions:      OT Goals(Current goals can be found in the care plan section) Acute Rehab OT Goals Patient Stated Goal: get back to the gym OT Goal Formulation: With patient Time For Goal Achievement: 06/27/19 Potential to Achieve Goals: Good  OT Frequency:      Barriers to D/C:            Co-evaluation              AM-PAC OT "6 Clicks" Daily Activity     Outcome Measure Help from another person eating meals?: None Help from another person taking care of personal grooming?: None Help from another person toileting, which includes using toliet, bedpan, or urinal?: None Help from another person bathing (including washing, rinsing, drying)?: None Help from another person to put on and taking off regular upper body clothing?: None Help from another person to put on and taking off regular lower body clothing?: None 6 Click Score: 24   End of Session Nurse Communication: Mobility status  Activity Tolerance: Patient tolerated treatment well Patient left: in bed  OT Visit Diagnosis: Other abnormalities of gait and mobility (R26.89)                Time: 5284-1324 OT Time Calculation (min): 8 min Charges:  OT General Charges $OT Visit: 1 Visit OT Evaluation $OT Eval Low Complexity: 1 Low  Zenovia Jarred, MSOT, OTR/L Acute Rehabilitation Services [facility] Office Number: [phone] Pager: [phone]  Zenovia Jarred 06/13/2019, 12:39 PM

## 2019-06-13 NOTE — Anesthesia Preprocedure Evaluation (Addendum)
Anesthesia Evaluation  Patient identified by MRN, date of birth, ID band Patient awake    Reviewed: Allergy & Precautions, NPO status , Patient's Chart, lab work & pertinent test results, reviewed documented beta blocker date and time   Airway Mallampati: II  TM Distance: >3 FB Neck ROM: Full    Dental  (+) Dental Advisory Given, Lower Dentures, Upper Dentures   Pulmonary sleep apnea ,    Pulmonary exam normal breath sounds clear to auscultation       Cardiovascular hypertension, Pt. on home beta blockers Normal cardiovascular exam Rhythm:Regular Rate:Normal     Neuro/Psych negative neurological ROS  negative psych ROS   GI/Hepatic Neg liver ROS, painless hematochezia   Endo/Other  Morbid obesity  Renal/GU Renal InsufficiencyRenal disease     Musculoskeletal negative musculoskeletal ROS (+)   Abdominal   Peds  Hematology  (+) Blood dyscrasia, anemia ,   Anesthesia Other Findings Day of surgery medications reviewed with the patient.  Reproductive/Obstetrics                            Anesthesia Physical Anesthesia Plan  ASA: III  Anesthesia Plan: MAC   Post-op Pain Management:    Induction: Intravenous  PONV Risk Score and Plan: 1 and Treatment may vary due to age or medical condition and Propofol infusion  Airway Management Planned: Natural Airway and Nasal Cannula  Additional Equipment:   Intra-op Plan:   Post-operative Plan:   Informed Consent: I have reviewed the patients History and Physical, chart, labs and discussed the procedure including the risks, benefits and alternatives for the proposed anesthesia with the patient or authorized representative who has indicated his/her understanding and acceptance.     Dental advisory given  Plan Discussed with: CRNA  Anesthesia Plan Comments:         Anesthesia Quick Evaluation

## 2019-06-13 NOTE — Op Note (Signed)
[facility] [name] Procedure Date : 06/13/2019 MRN: [id number] Attending MD: Jackquline Denmark , MD Date of Birth: 07/09/1960 CSN: [id number] Age: 59 Admit Type: Inpatient Procedure:                Colonoscopy Indications:              Rectal bleeding Providers:                Jackquline Denmark, MD, Glori Bickers, RN, Laverda Sorenson,                            Technician, Eugenia Mcalpine, CRNA Referring MD:              Medicines:                Monitored Anesthesia Care Complications:            No immediate complications. Estimated Blood Loss:     Estimated blood loss: none. Procedure:                Pre-Anesthesia Assessment:                           - Prior to the procedure, a History and Physical                            was performed, and patient medications and                            allergies were reviewed. The patient's tolerance of                            previous anesthesia was also reviewed. The risks                            and benefits of the procedure and the sedation                            options and risks were discussed with the patient.                            All questions were answered, and informed consent                            was obtained. Prior Anticoagulants: The patient has                            taken no previous anticoagulant or antiplatelet                            agents. ASA Grade Assessment: III - A patient with                            severe systemic disease. After reviewing the risks  and benefits, the patient was deemed in                            satisfactory condition to undergo the procedure.                           After obtaining informed consent, the colonoscope                            was passed under direct vision. Throughout the                            procedure, the patient's blood pressure, pulse, and                            oxygen saturations  were monitored continuously. The                            CF-HQ190L ([id number]) Olympus colonoscope was                            introduced through the anus and advanced to the 1                            cm into the ileum. The colonoscopy was performed                            without difficulty. The patient tolerated the                            procedure well. The quality of the bowel                            preparation was fair. The ileocecal valve,                            appendiceal orifice, and rectum were photographed. Scope In: 3:32:09 PM Scope Out: 3:56:25 PM Scope Withdrawal Time: 0 hours 20 minutes 20 seconds  Total Procedure Duration: 0 hours 24 minutes 16 seconds  Findings:      Blood was noted throughout the colon with few clots in the ascending       colon. This did limit examination.      Multiple medium-mouthed diverticula were found in the sigmoid colon,       transverse colon and ascending colon.      A 6 mm polyp was found in the proximal transverse colon. The polyp was       sessile. Not removed to avoid complicating current clinical picture.      The terminal ileum appeared normal. No blood in TI      Non-bleeding internal hemorrhoids were found during retroflexion. The       hemorrhoids were small. Impression:               -Patient likely had diverticular bleed from right  colonic diverticulosis. Appears to have stopped.                           -Pancolonic diverticulosis predominantly in the                            sigmoid colon.                           -Preparation of the colon was fair d/t presence of                            old blood/clots.                           -One 6 mm polyp in the proximal transverse colon                            (not removed to avoid complicating current clinical                            picture)                           -The examined portion of the ileum was normal.                            -Non-bleeding internal hemorrhoids.                           -No specimens collected. Recommendation:           - Return patient to hospital ward for ongoing care.                           - Resume previous diet.                           - Trend CBC. Transfuse to keep Hb>7.                           - Continue present medications.                           - Repeat colonoscopy in 3-6 months because of above                            limitations.                           - If further symptoms, consider performing a RBC                            bleeding scan followed by mesenteric angiography.                           - The findings and recommendations were discussed  with the patient. Procedure Code(s):        --- Professional ---                           [phone], Colonoscopy, flexible; diagnostic, including                            collection of specimen(s) by brushing or washing,                            when performed (separate procedure) Diagnosis Code(s):        --- Professional ---                           K64.8, Other hemorrhoids                           K63.5, Polyp of colon                           K62.5, Hemorrhage of anus and rectum                           K57.30, Diverticulosis of large intestine without                            perforation or abscess without bleeding CPT copyright 2019 American Medical Association. All rights reserved. The codes documented in this report are preliminary and upon coder review may  be revised to meet current compliance requirements. Jackquline Denmark, MD 06/13/2019 4:10:28 PM This report has been signed electronically. Number of Addenda: 0

## 2019-06-13 NOTE — Anesthesia Procedure Notes (Signed)
Procedure Name: MAC Date/Time: 06/13/2019 3:20 PM Performed by: Griffin Dakin, CRNA Pre-anesthesia Checklist: Patient identified, Emergency Drugs available, Suction available, Patient being monitored and Timeout performed Patient Re-evaluated:Patient Re-evaluated prior to induction Oxygen Delivery Method: Simple face mask Induction Type: IV induction Placement Confirmation: positive ETCO2 Dental Injury: Teeth and Oropharynx as per pre-operative assessment

## 2019-06-13 NOTE — Interval H&P Note (Signed)
History and Physical Interval Note:  06/13/2019 3:11 PM  Johnny [name]  has presented today for surgery, with the diagnosis of painless hematochezia, blood loss and chronic anemia.  The various methods of treatment have been discussed with the patient and family. After consideration of risks, benefits and other options for treatment, the patient has consented to  Procedure(s): COLONOSCOPY WITH PROPOFOL (N/A) as a surgical intervention.  The patient's history has been reviewed, patient examined, no change in status, stable for surgery.  I have reviewed the patient's chart and labs.  Questions were answered to the patient's satisfaction.     Jackquline Denmark

## 2019-06-13 NOTE — Anesthesia Postprocedure Evaluation (Signed)
Anesthesia Post Note  Patient: Johnny [name]  Procedure(s) Performed: COLONOSCOPY WITH PROPOFOL (N/A )     Patient location during evaluation: Endoscopy Anesthesia Type: MAC Level of consciousness: awake and alert Pain management: pain level controlled Vital Signs Assessment: post-procedure vital signs reviewed and stable Respiratory status: spontaneous breathing, nonlabored ventilation, respiratory function stable and patient connected to nasal cannula oxygen Cardiovascular status: stable and blood pressure returned to baseline Postop Assessment: no apparent nausea or vomiting Anesthetic complications: no    Last Vitals:  Vitals:   06/13/19 1606 06/13/19 1614  BP: 111/63 (!) 126/58  Pulse: 87 80  Resp: 19 (!) 24  Temp: 36.6 C   SpO2: 100% 100%    Last Pain:  Vitals:   06/13/19 1614  TempSrc:   PainSc: 0-No pain                 Catalina Gravel

## 2019-06-13 NOTE — Evaluation (Signed)
Physical Therapy Evaluation Patient Details Name: Johnny [name] MRN: [id number] DOB: 1960/10/04 Today's Date: 06/13/2019   History of Present Illness   59 y.o. male presenting with fatigue/dyspnea in the setting of acute hematochezia. PMH is significant for diverticulosis, HTN, CKD stage IV, obesity, OSA, lipodystrophy. Presented to ED with increased fatigue. Admitted 06/11/19 for symptomatic anemia 2/2 acute painless LGIB.  Clinical Impression  Patient evaluated by Physical Therapy with no further acute PT needs identified. All education has been completed and the patient has no further questions. Pt is independent with mobility and currently has no Physical Therapy or equipment needs. PT is signing off. Thank you for this referral.     Follow Up Recommendations No PT follow up    Equipment Recommendations  None recommended by PT    Recommendations for Other Services       Precautions / Restrictions Precautions Precautions: None Restrictions Weight Bearing Restrictions: No      Mobility  Bed Mobility Overal bed mobility: Independent                Transfers Overall transfer level: Independent                  Ambulation/Gait Ambulation/Gait assistance: Independent Gait Distance (Feet): 200 Feet Assistive device: IV Pole Gait Pattern/deviations: WFL(Within Functional Limits);Step-through pattern Gait velocity: WFL Gait velocity interpretation: >4.37 ft/sec, indicative of normal walking speed General Gait Details: slight waddling gait due to body habitus, but steady, pushed IV pole but did not need it for support, preparing for colonscopy so did not want to ambulate too far        Balance Overall balance assessment: Modified Independent                                           Pertinent Vitals/Pain Pain Assessment: No/denies pain    Home Living Family/patient expects to be discharged to:: Private residence Living Arrangements:  Other relatives Available Help at Discharge: Family;Available 24 hours/day Type of Home: House Home Access: Stairs to enter Entrance Stairs-Rails: Can reach both Entrance Stairs-Number of Steps: 4 Home Layout: One level Home Equipment: Walker - 2 wheels;Cane - single point;Grab bars - tub/shower;Hand held shower head      Prior Function Level of Independence: Independent         Comments: works out a couple time a week, walks a few miles and lifts weights     Hand Dominance        Extremity/Trunk Assessment   Upper Extremity Assessment Upper Extremity Assessment: Overall WFL for tasks assessed    Lower Extremity Assessment Lower Extremity Assessment: Overall WFL for tasks assessed       Communication   Communication: No difficulties  Cognition Arousal/Alertness: Awake/alert Behavior During Therapy: WFL for tasks assessed/performed Overall Cognitive Status: Within Functional Limits for tasks assessed                                        General Comments General comments (skin integrity, edema, etc.): VSS on room air         Assessment/Plan    PT Assessment Patent does not need any further PT services         PT Goals (Current goals can be found in the Care Plan section)  Acute  Rehab PT Goals Patient Stated Goal: get back to the gym PT Goal Formulation: With patient     AM-PAC PT "6 Clicks" Mobility  Outcome Measure Help needed turning from your back to your side while in a flat bed without using bedrails?: None Help needed moving from lying on your back to sitting on the side of a flat bed without using bedrails?: None Help needed moving to and from a bed to a chair (including a wheelchair)?: None Help needed standing up from a chair using your arms (e.g., wheelchair or bedside chair)?: None Help needed to walk in hospital room?: None Help needed climbing 3-5 steps with a railing? : None 6 Click Score: 24    End of Session    Activity Tolerance: Patient tolerated treatment well Patient left: in chair;with call bell/phone within reach Nurse Communication: Mobility status      Time: 5927-6394 PT Time Calculation (min) (ACUTE ONLY): 19 min   Charges:   PT Evaluation $PT Eval Moderate Complexity: 1 Mod          Jacqulin Brandenburger B. Migdalia Dk PT, DPT Acute Rehabilitation Services Pager [phone] Office [phone]   [place] 06/13/2019, 7:43 AM

## 2019-06-13 NOTE — Progress Notes (Signed)
Family Medicine Teaching Service Daily Progress Note Intern Pager: [phone]  [name] Medical record number: [id number] Date of birth: 1960/08/27 Age: 59 y.o. Gender: male  Primary Care Provider: Danna Hefty, DO Consultants: GI Code Status: [place]  Pt Overview and Major Events to Date: 4/19 Admitted, GI consulted  Assessment and Plan: Johnny [name] is a 59 y.o. male presenting with fatigue/dyspnea in the setting of acute hematochezia. PMH is significant for diverticulosis, HTN, CKD stage IV, obesity, OSA, lipodystrophy.  Symptomatic anemia 2/2 Acute painless LGIB  Symptoms of anemia has improved Hgb 7.0 s/p 5U pRBCs. Will go for colonoscopy today. -GI consulted, appreciate recommendation:              1. NPO             2. Colonoscopy 06/13/19 -Consider IVF when NPO -AM CBC/BMP -Monitor BP -f/u folate  AKI on CKD Stage IV  Cr 4.37 on admission, AM labs 3.72. Baseline appears to be 3.4-3.9. K 4.8. Sees Dr. Posey Pronto at [facility]. Does not receive dialysis treatment as of yet. Home medication regimen is Lokelma 10g 1x weekly on fridays, NaBicarb 650mg  BID, calcifediol 64mcg daily.  -Continue NaBicarb, increase to 1.3g BID -Hold lokelma and calcifediol for now -Continue to monitor on AM RFP -Avoid nephrotoxic drugs  Hypertension  BP 118/72 Home medication is Coreg 25mg  daily.  -Continue Coreg at reduced dose of 6.25 BID  -Monitor BP for hypotension   OSA Does not wear CPAP. satting 100% RA.  -Continue Albuterol q4h PRN -Counseled on starting CPAP -pulse ox monitoring  Acquired lymphedema  lipodystrophy  Due to excessive weight loss. S/p excision and reduction of medial thighs. Last seen Dr. Marla Roe 03/30/2019 who recommended PT, compression stockings, and follow up in 3 months. Home medication is  Lasix 40mg  daily. -Hold lasix as patient is volume down, reassess daily  -Encourage compression stocking use -Follow up outpatient with Dr.  Marla Roe -Vaseline for dry skin   FEN/GI: CLD, Moviprep Prophylaxis: SCDs   Disposition: Progressive, INP; home pending further medical work up  Subjective:  No concerns at this time.   Objective: Temp:  [97.3 F (36.3 C)-98 F (36.7 C)] 98 F (36.7 C) (04/21 0505) Pulse Rate:  [71-120] 79 (04/21 0505) Resp:  [14-27] 20 (04/21 0505) BP: (111-138)/(54-95) 118/72 (04/21 0505) SpO2:  [100 %] 100 % (04/21 0505) Weight:  [144.9 kg-146.9 kg] 144.9 kg (04/21 0505)  Physical Exam:  General: Appears well, no acute distress. Age appropriate. Cardiac: RRR, normal heart sounds, no murmurs Respiratory: CTAB, normal effort Abdomen: soft, nontender, moderately distended, +bowel sounds Extremities:LE pitting edema that is improving  Skin: Warm and dry, LE venous stasis skin changes Neuro: alert and oriented x4  Laboratory: Recent Labs  Lab 06/11/19 1110 06/11/19 2330 06/12/19 0743 06/12/19 1655 06/13/19 0516  WBC 8.0  --  10.6*  --  7.2  HGB 5.4*   < > 5.7* 7.8* 7.0*  HCT 16.9*   < > 17.8* 23.6* 21.3*  PLT 237  --  209  --  186   < > = values in this interval not displayed.   Recent Labs  Lab 06/11/19 1110 06/12/19 0743 06/13/19 0516  NA 136 136 139  K 5.1 4.9 4.8  CL 111 114* 115*  CO2 16* 14* 11*  BUN 70* 67* 57*  CREATININE 4.37* 3.94* 3.72*  CALCIUM 7.9* 7.5* 7.9*  PROT 5.5*  --   --   BILITOT 0.4  --   --  ALKPHOS 42  --   --   ALT 11  --   --   AST 13*  --   --   GLUCOSE 114* 112* 103*    Imaging/Diagnostic Tests: Now new imaging at this time.   Gerlene Fee, DO 06/13/2019, 7:39 AM PGY-1, [place] Intern pager: [phone], text pages welcome

## 2019-06-14 LAB — BASIC METABOLIC PANEL
Anion gap: 7 (ref 5–15)
BUN: 46 mg/dL — ABNORMAL HIGH (ref 6–20)
CO2: 17 mmol/L — ABNORMAL LOW (ref 22–32)
Calcium: 8 mg/dL — ABNORMAL LOW (ref 8.9–10.3)
Chloride: 116 mmol/L — ABNORMAL HIGH (ref 98–111)
Creatinine, Ser: 3.64 mg/dL — ABNORMAL HIGH (ref 0.61–1.24)
GFR calc Af Amer: 20 mL/min — ABNORMAL LOW (ref >60)
GFR calc non Af Amer: 17 mL/min — ABNORMAL LOW (ref >60)
Glucose, Bld: 94 mg/dL (ref 70–99)
Potassium: 4.9 mmol/L (ref 3.5–5.1)
Sodium: 140 mmol/L (ref 135–145)

## 2019-06-14 LAB — FOLATE RBC
Folate, Hemolysate: 260 ng/mL
Folate, RBC: 1176 ng/mL (ref >498)
Hematocrit: 22.1 % — ABNORMAL LOW (ref 37.5–51.0)

## 2019-06-14 LAB — CBC
HCT: 20 % — ABNORMAL LOW (ref 39.0–52.0)
Hemoglobin: 6.4 g/dL — CL (ref 13.0–17.0)
MCH: 29.9 pg (ref 26.0–34.0)
MCHC: 32 g/dL (ref 30.0–36.0)
MCV: 93.5 fL (ref 80.0–100.0)
Platelets: 203 10*3/uL (ref 150–400)
RBC: 2.14 MIL/uL — ABNORMAL LOW (ref 4.22–5.81)
RDW: 16 % — ABNORMAL HIGH (ref 11.5–15.5)
WBC: 7.5 10*3/uL (ref 4.0–10.5)
nRBC: 0.4 % — ABNORMAL HIGH (ref 0.0–0.2)

## 2019-06-14 LAB — HEMOGLOBIN AND HEMATOCRIT, BLOOD
HCT: 27.3 % — ABNORMAL LOW (ref 39.0–52.0)
Hemoglobin: 8.9 g/dL — ABNORMAL LOW (ref 13.0–17.0)

## 2019-06-14 LAB — PREPARE RBC (CROSSMATCH)

## 2019-06-14 MED ORDER — SODIUM CHLORIDE 0.9% IV SOLUTION: Freq: Once | INTRAVENOUS | Status: AC

## 2019-06-14 NOTE — Discharge Instructions (Signed)
Dear [name],   Thank you so much for allowing [place] to be part of your care!  You were admitted to [facility] for an acute GI bleed, your bleeding was felt to be from diverticuli within your colon after evaluation via colonoscopy (the scope procedure that looked into your bowels).  You received several units of blood to get your hemoglobin back to a reasonable level.  POST-HOSPITAL & CARE INSTRUCTIONS 1. Make sure that you follow-up with GI.  2. Please let PCP/Specialists know of any changes that were made.  3. Please see medications section of this packet for any medication changes.   DOCTOR'S APPOINTMENT & FOLLOW UP CARE INSTRUCTIONS  Future Appointments  Date Time Provider [place]  06/29/2019  8:00 AM [place], Loel Lofty, DO [facility] None   RETURN PRECAUTIONS: If you experience any further red or black stools, dark or red vomit, severe abdominal pain, persistent lightheadedness/dizziness, or chest pain please make sure you seek medical care right away.  Take care and be well!   [facility]  [place], [place] [address] [phone]

## 2019-06-14 NOTE — Progress Notes (Signed)
PT Cancellation Note  Patient Details Name: Johnny [name] MRN: [id number] DOB: [birth year]   Cancelled Treatment:    Reason Eval/Treat Not Completed: Patient declined, no reason specified. Pt attempts to see pt for evaluation. Per pt he recently finished exercising in room, walking laps and performing other various exercises. When asked if pt feels he needs PT services at this time he states he is not sure, wants to wait until after blood transfusions are finished. Pt also reports he feels he is still mobilizing just as well as the other day when seen by PT and signed off on. PT will check on patient tomorrow, if pt still mobilizing independently PT will sign off again.   Zenaida Niece 06/14/2019, 4:45 PM

## 2019-06-14 NOTE — Progress Notes (Addendum)
Daily Rounding Note  06/14/2019, 10:46 AM  LOS: 3 days   SUBJECTIVE:   Chief complaint: Painless hematochezia.    Although the patient's Hb dropped, he feels fine.  He was up walking back and forth more than a dozen times in his room, doing his usual routine of squats and arm exercises.  Did not feel dizzy weak, lightheaded. No bowel movement since the bowel prep completed yesterday.  Tolerating solid food. Attending physician ordered additional PRBC x 2 for Hb 6.4  OBJECTIVE:         Vital signs in last 24 hours:    Temp:  [97.6 F (36.4 C)-98.6 F (37 C)] 98.6 F (37 C) (04/22 0755) Pulse Rate:  [70-99] 76 (04/22 0755) Resp:  [13-24] 16 (04/22 0755) BP: (111-134)/(50-92) 127/76 (04/22 0755) SpO2:  [98 %-100 %] 100 % (04/22 0755) Weight:  [144.9 kg] 144.9 kg (04/22 0127) Last BM Date: 06/13/19 Filed Weights   06/13/19 0505 06/13/19 1424 06/14/19 0127  Weight: (!) 144.9 kg (!) 144.9 kg (!) 144.9 kg   General: Patient looks well, he is anxious about everything but reasonable.  Comfortable. Heart: RRR. Chest: Clear bilaterally.  No labored breathing or cough. Abdomen: Nontender, soft, active bowel sounds. Extremities: Stable lower extremity edema. Neuro/Psych: Oriented x3.  Fully alert.  A little bit anxious given the situation understandable.  Intake/Output from previous day: 04/21 0701 - 04/22 0700 In: 2087.5 [P.O.:1280; I.V.:807.5] Out: 200 [Urine:200]  Intake/Output this shift: Total I/O In: 360 [P.O.:360] Out: -   Lab Results: Recent Labs    06/13/19 0516 06/13/19 1820 06/14/19 0551  WBC 7.2 6.9 7.5  HGB 7.0* 8.2* 6.4*  HCT 21.3* 24.9* 20.0*  PLT 186 224 203   BMET Recent Labs    06/12/19 0743 06/13/19 0516 06/14/19 0551  NA 136 139 140  K 4.9 4.8 4.9  CL 114* 115* 116*  CO2 14* 11* 17*  GLUCOSE 112* 103* 94  BUN 67* 57* 46*  CREATININE 3.94* 3.72* 3.64*  CALCIUM 7.5* 7.9* 8.0*    LFT Recent Labs    06/11/19 1110 06/12/19 0743  PROT 5.5*  --   ALBUMIN 2.7* 2.3*  AST 13*  --   ALT 11  --   ALKPHOS 42  --   BILITOT 0.4  --    PT/INR Recent Labs    06/12/19 0743  LABPROT 15.1  INR 1.2    Studies/Results: No results found.  ASSESMENT:   *   Painless hematochezia. 06/13/2019 colonoscopy: Blood throughout the colon some clots in the ascending colon which limited the exam.  Multiple diverticula in the sigmoid, transverse, ascending colon.  No active source of bleeding.  Sessile, 6 mm polyp at the proximal transverse colon, this was not removed to avoid complicating current acute GI bleeding picture.  Nonbleeding internal hemorrhoids. No bleeding since cleared bleeding during bowel prep.  *   Blood loss anemia. Hgb 5.4 >> PRBC x 5 >> 8.2 >> 6.4.  Despite this drop, patient is asymptomatic.  *   CKD baseline looks to be stage IV, AKI improved.   PLAN   *   Repeat colonoscopy to remove the polyp in 3 to 6 months.  Dr. Steve Rattler office will be in touch with the patient regarding scheduling this.  *   If further bleeding, recommend RBC bleeding scan, if positive for active bleeding.  Pursue mesenteric angiography/embolization (however advanced CKD may limit ability to administer IV  contrast). Given that he is not passing any blood, I do not think a bleeding scan is going to be useful at present despite the apparent drop in his Hb.    Azucena Freed  06/14/2019, 10:46 AM Phone [phone]    Attending physician's note   I have taken an interval history, reviewed the chart and examined the patient. I agree with the Advanced Practitioner's note, impression and recommendations.   No further active bleeding. Hb did drop down to 6.4 (I believe it is still equilibrating) Tolerating p.o. well  Diverticular bleed CKD4-being followed by Dr. Florene Glen Anemia of chronic disease.  Plan: -Agree with transfusion to Hb7-8 -FU with Dr. Florene Glen (nephrology) as  outpatient. ? Needs EPO/iron. -FU in GI clinic in 3 to 4 weeks.  He does require repeat colonoscopy in 3-6 months with removal of small polyp. -D/W patient and patient's sister in detail. -Recheck CBC in a.m..  If stable and no further bleeding, D/C home. -Will sign off for now.  Carmell Austria, MD Velora Heckler Fabienne Bruns [phone].

## 2019-06-14 NOTE — Progress Notes (Signed)
Family Medicine Teaching Service Daily Progress Note Intern Pager: [phone]  [name] Medical record number: [id number] Date of birth: [birth year] Age: 59 y.o. Gender: male  Primary Care Provider: Danna Hefty, DO Consultants: GI Code Status: Johnny [name]  Pt Overview and Major Events to Date: 4/19 Admitted, GI consulted  Assessment and Plan: Johnny [name] is a 59 y.o. male presenting with fatigue/dyspnea in the setting of acute hematochezia. PMH is significant for diverticulosis, HTN, CKD stage IV, obesity, OSA, lipodystrophy.  Symptomatic anemia 2/2 Acute painless LGIB  Symptoms of anemia has improved Hgb 6.4 s/p 5U pRBCs. Colonoscopy 4/21: Diverticulosis, one small polyp which was not removed. Looks like he had a right colonic diverticular bleed which appears to have stopped. With decreased hemoglobin will give another unit. Consider ffp although platelet count wnl. Patient does not endorse any BMs since colonoscopy. -GI consulted, appreciate recommendation: consider RBC scan, mesenteric angiograghy -Transfuse 2u f/u post H/H -AM CBC/BMP -Monitor BP -f/u folate  AKI on CKD Stage IV  Cr 4.37 on admission, AM labs 3.64. Baseline appears to be 3.4-3.9. K 4.8. Home medication regimen is Lokelma 10g 1x weekly on fridays, NaBicarb 650mg  BID, calcifediol 4mcg daily.  -Continue NaBicarb, increase to 1.3g BID -Hold lokelma and calcifediol for now -Continue to monitor on AM RFP -Avoid nephrotoxic drugs  Hypertension  BP 127/76. Home medication is Coreg 25mg  daily.  -Continue Coreg at reduced dose of 6.25 BID  -Monitor BP for hypotension   OSA Does not wear CPAP. satting 100% RA.  -Continue Albuterol q4h PRN -Counseled on starting CPAP -pulse ox monitoring  Acquired lymphedema  lipodystrophy  Due to excessive weight loss. S/p excision and reduction of medial thighs. Last seen Dr. Marla [name] 03/30/2019 who recommended PT, compression stockings, and follow up in  3 months. Home medication is  Lasix 40mg  daily. -Hold lasix as patient is volume down, reassess daily  -Encourage compression stocking use -Follow up outpatient with Dr. Marla [name] -Vaseline for dry skin   FEN/GI: CLD, Moviprep Prophylaxis: SCDs   Disposition: Home 4/23 pending stable hemoglobin  Subjective:  No concerns at this time.   Objective: Temp:  [97.6 F (36.4 C)-98.6 F (37 C)] 98.6 F (37 C) (04/22 0449) Pulse Rate:  [70-99] 86 (04/22 0449) Resp:  [13-24] 19 (04/22 0449) BP: (111-143)/(50-92) 129/75 (04/22 0449) SpO2:  [98 %-100 %] 99 % (04/22 0449) Weight:  [144.9 kg] 144.9 kg (04/22 0127)  Physical Exam:  General: Appears well, no acute distress. Age appropriate. Cardiac: RRR, normal heart sounds, no murmurs Respiratory: CTAB, normal effort Extremities: Improving LE edema. Compression stockings on. Neuro: alert and oriented x4  Laboratory: Recent Labs  Lab 06/13/19 0516 06/13/19 1820 06/14/19 0551  WBC 7.2 6.9 7.5  HGB 7.0* 8.2* 6.4*  HCT 21.3* 24.9* 20.0*  PLT 186 224 203   Recent Labs  Lab 06/11/19 1110 06/11/19 1110 06/12/19 0743 06/13/19 0516 06/14/19 0551  NA 136   < > 136 139 140  K 5.1   < > 4.9 4.8 4.9  CL 111   < > 114* 115* 116*  CO2 16*   < > 14* 11* 17*  BUN 70*   < > 67* 57* 46*  CREATININE 4.37*   < > 3.94* 3.72* 3.64*  CALCIUM 7.9*   < > 7.5* 7.9* 8.0*  PROT 5.5*  --   --   --   --   BILITOT 0.4  --   --   --   --  ALKPHOS 42  --   --   --   --   ALT 11  --   --   --   --   AST 13*  --   --   --   --   GLUCOSE 114*   < > 112* 103* 94   < > = values in this interval not displayed.    Imaging/Diagnostic Tests:  Colonoscopy 06/13/2019  IMPRESSION: -Patient likely had diverticular bleed from right colonic diverticulosis. Appears to have stopped. -Pancolonic diverticulosis predominantly in the sigmoid colon. -Preparation of the colon was fair d/t presence of old blood/clots. -One 6 mm polyp in the proximal transverse  colon (not removed to avoid complicating current clinical picture) -The examined portion of the ileum was normal. -Non-bleeding internal hemorrhoids. -No specimens collected.  Johnny Fee, DO 06/14/2019, 7:51 AM PGY-1, [place] Intern pager: [phone], text pages welcome

## 2019-06-14 NOTE — Progress Notes (Addendum)
FPTS Interim Progress Note  Hgb noted to be downtrending to 6.4 this AM. Patient denies any further melena/hematochezia. Will touch base with GI prior to ordering blood this morning given their recs to consider RBC bleeding scan + mesenteric angiography.   Addendum: Dr. Darrelyn Hillock spoke with Dr. Lyndel Safe who opted to hold off on RBC scan.  Will transfuse 2 units and monitor Hgb and bleeding.  Mina Marble Salineno, DO 06/14/2019, 7:49 AM PGY-2, [place] Medicine Service pager [phone]

## 2019-06-14 NOTE — Plan of Care (Signed)
  Problem: Health Behavior/Discharge Planning: Goal: Ability to manage health-related needs will improve Outcome: Progressing   Problem: Education: Goal: Knowledge of General Education information will improve Description: Including pain rating scale, medication(s)/side effects and non-pharmacologic comfort measures Outcome: Progressing   

## 2019-06-15 ENCOUNTER — Encounter: Payer: Self-pay | Admitting: *Deleted

## 2019-06-15 LAB — CBC
HCT: 24.3 % — ABNORMAL LOW (ref 39.0–52.0)
HCT: 27.6 % — ABNORMAL LOW (ref 39.0–52.0)
Hemoglobin: 7.9 g/dL — ABNORMAL LOW (ref 13.0–17.0)
Hemoglobin: 9 g/dL — ABNORMAL LOW (ref 13.0–17.0)
MCH: 29.5 pg (ref 26.0–34.0)
MCH: 30.1 pg (ref 26.0–34.0)
MCHC: 32.5 g/dL (ref 30.0–36.0)
MCHC: 32.6 g/dL (ref 30.0–36.0)
MCV: 90.7 fL (ref 80.0–100.0)
MCV: 92.3 fL (ref 80.0–100.0)
Platelets: 197 10*3/uL (ref 150–400)
Platelets: 231 10*3/uL (ref 150–400)
RBC: 2.68 MIL/uL — ABNORMAL LOW (ref 4.22–5.81)
RBC: 2.99 MIL/uL — ABNORMAL LOW (ref 4.22–5.81)
RDW: 16.3 % — ABNORMAL HIGH (ref 11.5–15.5)
RDW: 16.4 % — ABNORMAL HIGH (ref 11.5–15.5)
WBC: 6.5 10*3/uL (ref 4.0–10.5)
WBC: 7.8 10*3/uL (ref 4.0–10.5)
nRBC: 0.3 % — ABNORMAL HIGH (ref 0.0–0.2)
nRBC: 0 % (ref 0.0–0.2)

## 2019-06-15 LAB — BPAM RBC
Blood Product Expiration Date: 202105072359
Blood Product Expiration Date: 202105092359
Blood Product Expiration Date: 202105092359
Blood Product Expiration Date: 202105112359
Blood Product Expiration Date: 202105122359
Blood Product Expiration Date: 202105152359
Blood Product Expiration Date: 202105152359
ISSUE DATE / TIME: 202104191408
ISSUE DATE / TIME: 202104191724
ISSUE DATE / TIME: 202104200341
ISSUE DATE / TIME: 202104201030
ISSUE DATE / TIME: 202104201303
ISSUE DATE / TIME: 202104221205
ISSUE DATE / TIME: 202104221522
Unit Type and Rh: 6200
Unit Type and Rh: 6200
Unit Type and Rh: 6200
Unit Type and Rh: 6200
Unit Type and Rh: 6200
Unit Type and Rh: 6200
Unit Type and Rh: 6200

## 2019-06-15 LAB — TYPE AND SCREEN
ABO/RH(D): A POS
Antibody Screen: NEGATIVE
Unit division: 0
Unit division: 0
Unit division: 0
Unit division: 0
Unit division: 0
Unit division: 0
Unit division: 0

## 2019-06-15 NOTE — Plan of Care (Signed)
  Problem: Clinical Measurements: Goal: Will remain free from infection Outcome: Completed/Met Goal: Respiratory complications will improve Outcome: Completed/Met   Problem: Activity: Goal: Risk for activity intolerance will decrease Outcome: Completed/Met   Problem: Nutrition: Goal: Adequate nutrition will be maintained Outcome: Completed/Met   Problem: Coping: Goal: Level of anxiety will decrease Outcome: Completed/Met   Problem: Elimination: Goal: Will not experience complications related to bowel motility Outcome: Completed/Met Goal: Will not experience complications related to urinary retention Outcome: Completed/Met   Problem: Pain Managment: Goal: General experience of comfort will improve Outcome: Completed/Met   Problem: Safety: Goal: Ability to remain free from injury will improve Outcome: Completed/Met   Problem: Skin Integrity: Goal: Risk for impaired skin integrity will decrease Outcome: Completed/Met

## 2019-06-15 NOTE — Progress Notes (Signed)
PT Cancellation Note  Patient Details Name: Johnny [name] MRN: [id number] DOB: 09/18/1960   Cancelled Treatment:    Reason Eval/Treat Not Completed: PT screened, no needs identified, will sign off. Pt evaluated by PT 4/21, independent mobility with no PT needs identified. Pt screened today. Reports his mobility is just fine and has no need for PT. PT signing off.   Lorriane Shire 06/15/2019, 9:29 AM  Lorrin Goodell, PT  Office # [phone] Pager [phone]

## 2019-06-15 NOTE — Progress Notes (Signed)
OT Cancellation Note  Patient Details Name: Johnny [name] MRN: [id number] DOB: 1960-08-12   Cancelled Treatment:    Reason Eval/Treat Not Completed: OT screened, no needs identified, will sign off(Pt recently evaluated by this therapist. No acute changes with ADL or mobility noted. OT will sign off, for pt is independnet. Thank you for this consult.)  Zenovia Jarred, MSOT, OTR/L [facility] Office Number: [phone] Pager: [phone]  Zenovia Jarred 06/15/2019, 9:59 AM

## 2019-06-15 NOTE — Discharge Summary (Signed)
[facility] Discharge Summary  [name] Medical record number: [id number] Date of birth: April 30, 1960 Age: 59 y.o. Gender: male Date of Admission: 06/11/2019  Date of Discharge: 06/15/2019 Admitting Physician: Gerlene Fee, DO  Primary Care Provider: Danna Hefty, DO Consultants: GI  Indication for Hospitalization: Symptomatic anemia, rectal bleeding  Discharge Diagnoses/Problem List:  Anemia BRBPR Diverticulosis AKI CKD stage IV HTN OSA Acquired lymphedema  Lipodystrophy  Disposition: Home  Discharge Condition: Stable  Discharge Exam:   General: Appears well, no acute distress. Age appropriate. Sitting in bedside chair Cardiac: RRR, normal heart sounds, no murmurs Respiratory: CTAB, normal effort Extremities: Moderate LE pitting edema or cyanosis. Neuro: alert and oriented x4  Brief Hospital Course:  Johnny [name] is a 59 y.o. male who presented with fatigue/dyspnea in the setting of acute hematochezia. PMH is significant for diverticulosis, HTN, CKD stage IV, obesity, OSA, lipodystrophy.   Symptomatic anemia 2/2 Acute painless LGIB  Patient present with fatigue and DOE with recent BRBPR for 3 days. Hbg 5.4 in ED. Was started on mIVF and received 2 units of packed RBCs after frank blood w/ rectal exam. GI consulted shortly following admission. Colonoscopy 06/13/19 showed that the patient likely had diverticular bleed from right colonic diverticulosis that stopped. Pancolonic diverticulosis predominantly in the sigmoid colon. One 6 mm polyp in the proximal transverse colon (not removed to avoid complicating current clinical picture). The examined portion of the ileum was normal. Non-bleeding internal hemorrhoids. At time of discharged, Hgb trended up to 9.0 s/p 7U pRBCs.   Issues for Follow Up:  1. Hgb recheck. Transfuse to keep Hb>7.  2. Follow up with GI for repeat colonoscopy to remove the polyp in 3 to 6  months. 3. Continue to follow up with Nephrology.  Significant Procedures:  Colonoscopy Impressions: -Patient likely had diverticular bleed from right colonic diverticulosis. Appears to have stopped. -Pancolonic diverticulosis predominantly in the sigmoid colon. -Preparation of the colon was fair d/t presence of old blood/clots. -One 6 mm polyp in the proximal transverse colon (not removed to avoid complicating current clinical picture) -The examined portion of the ileum was normal. -Non-bleeding internal hemorrhoids. -No specimens collected.  Significant Labs and Imaging:  Recent Labs  Lab 06/14/19 0551 06/14/19 0551 06/14/19 2047 06/15/19 0450 06/15/19 1318  WBC 7.5  --   --  6.5 7.8  HGB 6.4*   < > 8.9* 7.9* 9.0*  HCT 20.0*   < > 27.3* 24.3* 27.6*  PLT 203  --   --  197 231   < > = values in this interval not displayed.   Recent Labs  Lab 06/11/19 1110 06/11/19 1110 06/12/19 0743 06/12/19 0743 06/13/19 0516 06/14/19 0551  NA 136  --  136  --  139 140  K 5.1   < > 4.9   < > 4.8 4.9  CL 111  --  114*  --  115* 116*  CO2 16*  --  14*  --  11* 17*  GLUCOSE 114*  --  112*  --  103* 94  BUN 70*  --  67*  --  57* 46*  CREATININE 4.37*  --  3.94*  --  3.72* 3.64*  CALCIUM 7.9*  --  7.5*  --  7.9* 8.0*  PHOS  --   --  4.4  --   --   --   ALKPHOS 42  --   --   --   --   --  AST 13*  --   --   --   --   --   ALT 11  --   --   --   --   --   ALBUMIN 2.7*  --  2.3*  --   --   --    < > = values in this interval not displayed.    Results/Tests Pending at Time of Discharge: N/A  Discharge Medications:  Allergies as of 06/15/2019      Reactions   Lisinopril Swelling   Angioedema      Medication List    STOP taking these medications   guaiFENesin-dextromethorphan 100-10 MG/5ML syrup Commonly known as: ROBITUSSIN DM   ondansetron 4 MG tablet Commonly known as: ZOFRAN     TAKE these medications   albuterol 108 (90 Base) MCG/ACT inhaler Commonly known as:  VENTOLIN HFA Inhale 1-2 puffs into the lungs every 4 (four) hours as needed for wheezing or shortness of breath.   ascorbic acid 500 MG tablet Commonly known as: VITAMIN C Take 1 tablet (500 mg total) by mouth daily.   aspirin EC 81 MG tablet Take 81 mg by mouth daily.   carvedilol 25 MG tablet Commonly known as: COREG Take 1 tablet (25 mg total) by mouth 2 (two) times daily with a meal.   feeding supplement (NEPRO CARB STEADY) Liqd Take 237 mLs by mouth 3 (three) times daily as needed (Supplement).   furosemide 40 MG tablet Commonly known as: LASIX Take 1 tablet (40 mg total) by mouth daily.   Rayaldee 30 MCG Cpcr Generic drug: Calcifediol ER Take 30 mcg by mouth daily.   sodium bicarbonate 650 MG tablet Take 1 tablet (650 mg total) by mouth 2 (two) times daily.   sodium zirconium cyclosilicate 10 g Pack packet Commonly known as: LOKELMA Take 10 g by mouth daily. What changed:   when to take this  additional instructions   zinc sulfate 220 (50 Zn) MG capsule Take 1 capsule (220 mg total) by mouth daily.       Discharge Instructions: Please refer to Patient Instructions section of EMR for full details.  Patient was counseled important signs and symptoms that should prompt return to medical care, changes in medications, dietary instructions, activity restrictions, and follow up appointments.   Follow-Up Appointments: Follow-up Information    [facility] on 06/18/2019.   Why: At 1:50pm for hospital follow up.  Contact information: [address]       [facility] Follow up.   Specialty: Gastroenterology Why: Follow up in 3-4 weeks, you should hear a call from the clinic to schedule this, if not please call. Contact information: [place] [address] [phone]       Estanislado Emms, MD. Schedule an appointment as soon as possible for a  visit.   Specialty: Nephrology Why: Please follow-up with your nephrologist in the next few weeks. Contact information: [facility] [place] [phone]           Gerlene Fee, DO 06/15/2019, 4:17 PM PGY-1, [employer]

## 2019-06-17 NOTE — Progress Notes (Deleted)
    SUBJECTIVE:   CHIEF COMPLAINT / HPI:   *** Hospital follow up Patient admitted from 4/19 to 4/23 for painless bloody bowel movements results in symptomatic anemia. Hgb on admission 5.4 and received 2U PRBCs. GI was consulted and colonoscopy performed on 4/21 showing likely diverticular bleed from right colonic diverticulosis. Patient received a total of 7U PRBCs with hemoglobin 9.0 on discharge. Goal >7. Was advised to f/u with GI as outpatient.   PERTINENT  PMH / PSH: ***  OBJECTIVE:   There were no vitals taken for this visit.  ***  ASSESSMENT/PLAN:   No problem-specific Assessment & Plan notes found for this encounter.     Caroline More, [place]

## 2019-06-18 ENCOUNTER — Ambulatory Visit (INDEPENDENT_AMBULATORY_CARE_PROVIDER_SITE_OTHER): Payer: Medicaid Other | Admitting: Family Medicine

## 2019-06-18 ENCOUNTER — Other Ambulatory Visit: Payer: Self-pay

## 2019-06-18 VITALS — BP 132/80 | HR 83 | Ht 69.0 in | Wt 323.6 lb

## 2019-06-18 DIAGNOSIS — Z1159 Encounter for screening for other viral diseases: Secondary | ICD-10-CM | POA: Diagnosis not present

## 2019-06-18 DIAGNOSIS — Z0000 Encounter for general adult medical examination without abnormal findings: Secondary | ICD-10-CM | POA: Diagnosis not present

## 2019-06-18 DIAGNOSIS — N184 Chronic kidney disease, stage 4 (severe): Secondary | ICD-10-CM | POA: Diagnosis not present

## 2019-06-18 DIAGNOSIS — D649 Anemia, unspecified: Secondary | ICD-10-CM | POA: Diagnosis not present

## 2019-06-18 LAB — POCT HEMOGLOBIN: Hemoglobin: 8.3 g/dL — AB (ref 11–14.6)

## 2019-06-18 NOTE — Assessment & Plan Note (Signed)
-   Currently asymptomatic and returning to normal activities - CBC, Ferritin, and BMP today - Encouraged intake of red meat, leafy greens, and bivalves - Will add oral supplement if labs indicate - Continue to monitor stool for recurrent GI bleed - F/u with nephro in 2 weeks and GI in 3-6 months

## 2019-06-18 NOTE — Assessment & Plan Note (Signed)
-   Congratulated on healthy diet and exercise - Hep C screening today

## 2019-06-18 NOTE — Patient Instructions (Signed)
Please make sure to follow up with your GI doctor.  Follow up with your primary care doc in 1 month to ensure you are still doing well.   Follow up with your kidney doctor

## 2019-06-18 NOTE — Progress Notes (Addendum)
Subjective:  Patient ID: Johnny [name]  DOB: Nov 28, 1960 MRN: [id number]  [name] is a 59 y.o. male with a PMH of HTN and CKD, here today for hospital follow-up for a lower GI bleed and iron-deficiency anemia.   HPI: Anemia: - [name] has been feeling much better since his discharge from the hospital and has returned to his normal activities, including working out at the gym. He denies any excessive fatigue, light-headedness, dizziness, shortness of breath, visual changes, or chest pain, even with heavy exertion in the gym.  He reports having normal bowel movements and denies any blood in the stool. He is open to taking iron supplements, but would like to "start low" due to concerns about constipation.  He is also open to increasing the number of iron-rich foods in his diet.     Objective:  BP 132/80   Pulse 83   Ht 5\' 9"  (1.753 m)   Wt (!) 323 lb 9.6 oz (146.8 kg)   SpO2 99%   BMI 47.79 kg/m   Vitals and nursing note reviewed  General: NAD, pleasant Cardiac: RRR, normal heart sounds, no m/r/g, cap refill <2sec, 2+ pulses Pulm: normal effort, CTAB GI: soft, nontender, nondistended, bowel sounds normal Extremities: no significant edema or cyanosis.  Compression stockings in place with chronic lower extremity edema Skin: warm and dry, no rashes noted Neuro: alert and oriented Psych: normal affect, normal thought content  Assessment & Plan:   Anemia: - Currently asymptomatic and returning to normal activities - CBC, Ferritin, and BMP today - Encouraged intake of red meat, leafy greens, and bivalves - Will add oral supplement if labs indicate - Continue to monitor stool for recurrent GI bleed - F/u with nephro in 2 weeks and GI in 3-6 months  Health Maintenance: - Congratulated on healthy diet and exercise - Hep C screening today  Helen Student  Resident Attestation   I saw and evaluated the patient, performing the key elements of the  service.I  personally performed or re-performed the history, physical exam, and medical decision making activities of this service and have verified that the service and findings are accurately documented in the student's note. I developed the management plan that is described in the medical student's note, and I agree with the content, with my edits above.   In addition:  Hospital follow up Patient admitted from 4/19 to 4/23 for painless bloody bowel movements results in symptomatic anemia. Hgb on admission 5.4 and received 2U PRBCs. GI was consulted and colonoscopy performed on 4/21 showing likely diverticular bleed from right colonic diverticulosis. Patient received a total of 7U PRBCs with hemoglobin 9.0 on discharge. Goal >7. Was advised to f/u with GI as outpatient.   Patient currently states he is doing well without any further blood in his stools.  States he is even able to go to the gym, came straight here from the gym.  Denies any dizziness, shortness of breath, vision changes, chest pain.  States he has nephrologist appointment in 2 weeks.  We will plan today to obtain CBC with addition of ferritin.  Likely still has some iron residual from blood transfusions.  Also obtain BMP to ensure adequate kidney functioning.  Patient has a follow-up appoint with nephrology scheduled and will plan to call GI today to schedule a follow-up visit.  Advised to follow-up with PCP in 1 month.  Patient discussed with Dr. Andria Frames who came in and saw the patient as well.  Dr. Andria Frames  was involved in patient's care while he was in the hospital.

## 2019-06-19 LAB — BASIC METABOLIC PANEL
BUN/Creatinine Ratio: 10 (ref 9–20)
BUN: 37 mg/dL — ABNORMAL HIGH (ref 6–24)
CO2: 17 mmol/L — ABNORMAL LOW (ref 20–29)
Calcium: 8.5 mg/dL — ABNORMAL LOW (ref 8.7–10.2)
Chloride: 111 mmol/L — ABNORMAL HIGH (ref 96–106)
Creatinine, Ser: 3.62 mg/dL — ABNORMAL HIGH (ref 0.76–1.27)
GFR calc Af Amer: 20 mL/min/{1.73_m2} — ABNORMAL LOW (ref >59)
GFR calc non Af Amer: 18 mL/min/{1.73_m2} — ABNORMAL LOW (ref >59)
Glucose: 85 mg/dL (ref 65–99)
Potassium: 5.2 mmol/L (ref 3.5–5.2)
Sodium: 140 mmol/L (ref 134–144)

## 2019-06-19 LAB — CBC
Hematocrit: 27 % — ABNORMAL LOW (ref 37.5–51.0)
Hemoglobin: 9.2 g/dL — ABNORMAL LOW (ref 13.0–17.7)
MCH: 30.3 pg (ref 26.6–33.0)
MCHC: 34.1 g/dL (ref 31.5–35.7)
MCV: 89 fL (ref 79–97)
Platelets: 326 10*3/uL (ref 150–450)
RBC: 3.04 x10E6/uL — ABNORMAL LOW (ref 4.14–5.80)
RDW: 14.9 % (ref 11.6–15.4)
WBC: 8 10*3/uL (ref 3.4–10.8)

## 2019-06-19 LAB — FERRITIN: Ferritin: 80 ng/mL (ref 30–400)

## 2019-06-19 LAB — HEPATITIS C ANTIBODY: Hep C Virus Ab: 0.1 s/co ratio (ref 0.0–0.9)

## 2019-06-29 ENCOUNTER — Encounter: Payer: Self-pay | Admitting: Plastic Surgery

## 2019-06-29 ENCOUNTER — Other Ambulatory Visit: Payer: Self-pay

## 2019-06-29 ENCOUNTER — Ambulatory Visit (INDEPENDENT_AMBULATORY_CARE_PROVIDER_SITE_OTHER): Payer: Medicaid Other | Admitting: Plastic Surgery

## 2019-06-29 VITALS — BP 151/86 | HR 76 | Temp 97.8°F | Ht 69.0 in | Wt 324.8 lb

## 2019-06-29 DIAGNOSIS — I890 Lymphedema, not elsewhere classified: Secondary | ICD-10-CM | POA: Diagnosis not present

## 2019-06-29 NOTE — Progress Notes (Signed)
   Subjective:    Patient ID: Johnny [name], male    DOB: [birth year], 59 y.o.   MRN: [id number]  The patient is a 59 year old male here for follow-up on his bilateral leg excess skin excisions.  Overall he is doing well.  He has had a rough year.  He got Covid he has successfully gotten through the Covid vaccinations.  He then had a GI bleed and had several units of blood and hospitalized for a week.  He has recovered from all of that.  He is back in the gym and really anxious to get some weight off and get healthy again.  He has a pinpoint area on the posterior aspect of the left distal thigh that is scabbing.  Does not seem to be tunneling anywhere.  There is no redness and there is no sign of infection.  All all other areas look really good and have healed very nicely.     Review of Systems  Constitutional: Negative.   HENT: Negative.   Eyes: Negative.   Respiratory: Negative.   Cardiovascular: Negative.   Gastrointestinal: Negative.   Endocrine: Negative.   Genitourinary: Negative.   Musculoskeletal: Negative.        Objective:   Physical Exam Vitals and nursing note reviewed.  Constitutional:      Appearance: Normal appearance.  HENT:     Head: Normocephalic.  Cardiovascular:     Rate and Rhythm: Normal rate.     Pulses: Normal pulses.  Musculoskeletal:       Legs:  Neurological:     General: No focal deficit present.     Mental Status: He is alert and oriented to person, place, and time.  Psychiatric:        Mood and Affect: Mood normal.        Behavior: Behavior normal.        Assessment & Plan:     ICD-10-CM   1. Acquired lymphedema, bilateral thighs  I89.0     Recommend physical therapy for swelling.  Possible massage and compression.  Patient is highly motivated.  I would like to see him back in 3 months.  If he has any questions or concerns he knows to call us sooner.

## 2019-07-09 DIAGNOSIS — N184 Chronic kidney disease, stage 4 (severe): Secondary | ICD-10-CM | POA: Diagnosis not present

## 2019-07-18 DIAGNOSIS — N2581 Secondary hyperparathyroidism of renal origin: Secondary | ICD-10-CM | POA: Diagnosis not present

## 2019-07-18 DIAGNOSIS — D631 Anemia in chronic kidney disease: Secondary | ICD-10-CM | POA: Diagnosis not present

## 2019-07-18 DIAGNOSIS — N184 Chronic kidney disease, stage 4 (severe): Secondary | ICD-10-CM | POA: Diagnosis not present

## 2019-07-18 DIAGNOSIS — I129 Hypertensive chronic kidney disease with stage 1 through stage 4 chronic kidney disease, or unspecified chronic kidney disease: Secondary | ICD-10-CM | POA: Diagnosis not present

## 2019-10-02 ENCOUNTER — Encounter: Payer: Self-pay | Admitting: Plastic Surgery

## 2019-10-02 ENCOUNTER — Ambulatory Visit (INDEPENDENT_AMBULATORY_CARE_PROVIDER_SITE_OTHER): Payer: Medicaid Other | Admitting: Plastic Surgery

## 2019-10-02 ENCOUNTER — Other Ambulatory Visit: Payer: Self-pay | Admitting: Family Medicine

## 2019-10-02 ENCOUNTER — Other Ambulatory Visit: Payer: Self-pay

## 2019-10-02 VITALS — BP 150/90 | HR 68 | Temp 98.1°F | Ht 69.0 in | Wt 317.0 lb

## 2019-10-02 DIAGNOSIS — E881 Lipodystrophy, not elsewhere classified: Secondary | ICD-10-CM | POA: Diagnosis not present

## 2019-10-02 NOTE — Progress Notes (Signed)
   Subjective:    Patient ID: Johnny [name], male    DOB: November 12, 1960, 59 y.o.   MRN: [id number]  The patient is a 59 year old male here for follow-up on his bilateral thigh panniculectomies.  He is doing extremely well.  He is starting to walk again and increase his exercise from being quarantined with the Covid pandemic.  Overall he is extremely happy.  There is still some firmness and swelling on the left side no sign of drainage and no sign of infection or seroma.  It feels like tissue lymphedema although mild.  He is wearing compression socks.  The left leg is a little more edematous than the right.      Review of Systems  Constitutional: Positive for activity change. Negative for appetite change.  HENT: Negative.   Eyes: Negative.   Respiratory: Negative.  Negative for chest tightness.   Cardiovascular: Negative.   Gastrointestinal: Negative.   Endocrine: Negative.   Genitourinary: Negative.   Musculoskeletal: Negative.  Negative for back pain.  Skin: Negative for color change and wound.  Hematological: Negative.   Psychiatric/Behavioral: Negative.        Objective:   Physical Exam Vitals and nursing note reviewed.  Constitutional:      Appearance: Normal appearance.  HENT:     Head: Normocephalic and atraumatic.  Eyes:     Extraocular Movements: Extraocular movements intact.  Cardiovascular:     Rate and Rhythm: Normal rate.     Pulses: Normal pulses.  Pulmonary:     Effort: Pulmonary effort is normal. No respiratory distress.  Abdominal:     General: Abdomen is flat. There is no distension.  Skin:    General: Skin is warm.  Neurological:     General: No focal deficit present.     Mental Status: He is alert and oriented to person, place, and time.  Psychiatric:        Mood and Affect: Mood normal.        Behavior: Behavior normal.        Thought Content: Thought content normal.        Assessment & Plan:     ICD-10-CM   1. Lipodystrophy  E88.1     Will  refer to physical therapy for evaluation and treatment.  I think they may be able to provide him with some exercises that will be helpful for decreasing the swelling.  He should certainly continue with massage to the area.  Definitely continue with exercise and lower extremity compression.  Follow-up as needed.  It has been an absolute pleasure to take care of of Johnny [name] and we are available whenever needed.

## 2019-10-08 DIAGNOSIS — N184 Chronic kidney disease, stage 4 (severe): Secondary | ICD-10-CM | POA: Diagnosis not present

## 2019-10-18 DIAGNOSIS — N189 Chronic kidney disease, unspecified: Secondary | ICD-10-CM | POA: Diagnosis not present

## 2019-10-18 DIAGNOSIS — I129 Hypertensive chronic kidney disease with stage 1 through stage 4 chronic kidney disease, or unspecified chronic kidney disease: Secondary | ICD-10-CM | POA: Diagnosis not present

## 2019-10-18 DIAGNOSIS — D631 Anemia in chronic kidney disease: Secondary | ICD-10-CM | POA: Diagnosis not present

## 2019-10-18 DIAGNOSIS — N184 Chronic kidney disease, stage 4 (severe): Secondary | ICD-10-CM | POA: Diagnosis not present

## 2019-10-18 DIAGNOSIS — N2581 Secondary hyperparathyroidism of renal origin: Secondary | ICD-10-CM | POA: Diagnosis not present

## 2019-11-17 ENCOUNTER — Encounter (HOSPITAL_COMMUNITY): Payer: Self-pay

## 2019-11-17 ENCOUNTER — Other Ambulatory Visit: Payer: Self-pay

## 2019-11-17 ENCOUNTER — Observation Stay (HOSPITAL_COMMUNITY)
Admission: EM | Admit: 2019-11-17 | Discharge: 2019-11-18 | Disposition: A | Discharge status: Home / Self Care | Payer: Medicaid Other | Attending: Family Medicine | Admitting: Family Medicine

## 2019-11-17 DIAGNOSIS — Z20822 Contact with and (suspected) exposure to covid-19: Secondary | ICD-10-CM | POA: Insufficient documentation

## 2019-11-17 DIAGNOSIS — Z79899 Other long term (current) drug therapy: Secondary | ICD-10-CM | POA: Diagnosis not present

## 2019-11-17 DIAGNOSIS — I129 Hypertensive chronic kidney disease with stage 1 through stage 4 chronic kidney disease, or unspecified chronic kidney disease: Secondary | ICD-10-CM | POA: Diagnosis not present

## 2019-11-17 DIAGNOSIS — Z86711 Personal history of pulmonary embolism: Secondary | ICD-10-CM | POA: Diagnosis not present

## 2019-11-17 DIAGNOSIS — I10 Essential (primary) hypertension: Secondary | ICD-10-CM | POA: Diagnosis not present

## 2019-11-17 DIAGNOSIS — Z6841 Body Mass Index (BMI) 40.0 and over, adult: Secondary | ICD-10-CM | POA: Diagnosis not present

## 2019-11-17 DIAGNOSIS — K922 Gastrointestinal hemorrhage, unspecified: Secondary | ICD-10-CM | POA: Diagnosis not present

## 2019-11-17 DIAGNOSIS — E875 Hyperkalemia: Secondary | ICD-10-CM | POA: Diagnosis not present

## 2019-11-17 DIAGNOSIS — K5791 Diverticulosis of intestine, part unspecified, without perforation or abscess with bleeding: Secondary | ICD-10-CM | POA: Diagnosis present

## 2019-11-17 DIAGNOSIS — N179 Acute kidney failure, unspecified: Secondary | ICD-10-CM | POA: Diagnosis not present

## 2019-11-17 DIAGNOSIS — G4733 Obstructive sleep apnea (adult) (pediatric): Secondary | ICD-10-CM | POA: Insufficient documentation

## 2019-11-17 DIAGNOSIS — N189 Chronic kidney disease, unspecified: Secondary | ICD-10-CM

## 2019-11-17 DIAGNOSIS — N184 Chronic kidney disease, stage 4 (severe): Secondary | ICD-10-CM | POA: Diagnosis not present

## 2019-11-17 LAB — URINALYSIS, ROUTINE W REFLEX MICROSCOPIC
Bacteria, UA: NONE SEEN
Bilirubin Urine: NEGATIVE
Glucose, UA: NEGATIVE mg/dL
Hgb urine dipstick: NEGATIVE
Ketones, ur: NEGATIVE mg/dL
Leukocytes,Ua: NEGATIVE
Nitrite: NEGATIVE
Protein, ur: 30 mg/dL — AB
Specific Gravity, Urine: 1.008 (ref 1.005–1.030)
pH: 6 (ref 5.0–8.0)

## 2019-11-17 LAB — COMPREHENSIVE METABOLIC PANEL
ALT: 12 U/L (ref 0–44)
AST: 18 U/L (ref 15–41)
Albumin: 3.1 g/dL — ABNORMAL LOW (ref 3.5–5.0)
Alkaline Phosphatase: 52 U/L (ref 38–126)
Anion gap: 9 (ref 5–15)
BUN: 62 mg/dL — ABNORMAL HIGH (ref 6–20)
CO2: 18 mmol/L — ABNORMAL LOW (ref 22–32)
Calcium: 8.6 mg/dL — ABNORMAL LOW (ref 8.9–10.3)
Chloride: 108 mmol/L (ref 98–111)
Creatinine, Ser: 4.28 mg/dL — ABNORMAL HIGH (ref 0.61–1.24)
GFR calc Af Amer: 17 mL/min — ABNORMAL LOW (ref >60)
GFR calc non Af Amer: 14 mL/min — ABNORMAL LOW (ref >60)
Glucose, Bld: 120 mg/dL — ABNORMAL HIGH (ref 70–99)
Potassium: 5.7 mmol/L — ABNORMAL HIGH (ref 3.5–5.1)
Sodium: 135 mmol/L (ref 135–145)
Total Bilirubin: 0.5 mg/dL (ref 0.3–1.2)
Total Protein: 6.8 g/dL (ref 6.5–8.1)

## 2019-11-17 LAB — POC OCCULT BLOOD, ED: Fecal Occult Bld: POSITIVE — AB

## 2019-11-17 LAB — TYPE AND SCREEN
ABO/RH(D): A POS
Antibody Screen: NEGATIVE

## 2019-11-17 LAB — CBC
HCT: 34.1 % — ABNORMAL LOW (ref 39.0–52.0)
HCT: 34.9 % — ABNORMAL LOW (ref 39.0–52.0)
Hemoglobin: 10.2 g/dL — ABNORMAL LOW (ref 13.0–17.0)
Hemoglobin: 10.4 g/dL — ABNORMAL LOW (ref 13.0–17.0)
MCH: 24.9 pg — ABNORMAL LOW (ref 26.0–34.0)
MCH: 24.8 pg — ABNORMAL LOW (ref 26.0–34.0)
MCHC: 29.9 g/dL — ABNORMAL LOW (ref 30.0–36.0)
MCHC: 29.8 g/dL — ABNORMAL LOW (ref 30.0–36.0)
MCV: 83.2 fL (ref 80.0–100.0)
MCV: 83.3 fL (ref 80.0–100.0)
Platelets: 227 10*3/uL (ref 150–400)
Platelets: 216 10*3/uL (ref 150–400)
RBC: 4.1 MIL/uL — ABNORMAL LOW (ref 4.22–5.81)
RBC: 4.19 MIL/uL — ABNORMAL LOW (ref 4.22–5.81)
RDW: 20.2 % — ABNORMAL HIGH (ref 11.5–15.5)
RDW: 19.9 % — ABNORMAL HIGH (ref 11.5–15.5)
WBC: 6.1 10*3/uL (ref 4.0–10.5)
WBC: 5.8 10*3/uL (ref 4.0–10.5)
nRBC: 0 % (ref 0.0–0.2)
nRBC: 0 % (ref 0.0–0.2)

## 2019-11-17 LAB — RESPIRATORY PANEL BY RT PCR (FLU A&B, COVID)
Influenza A by PCR: NEGATIVE
Influenza B by PCR: NEGATIVE
SARS Coronavirus 2 by RT PCR: NEGATIVE

## 2019-11-17 LAB — SODIUM, URINE, RANDOM: Sodium, Ur: 109 mmol/L

## 2019-11-17 LAB — CREATININE, URINE, RANDOM: Creatinine, Urine: 34.09 mg/dL

## 2019-11-17 MED ORDER — CARVEDILOL 25 MG PO TABS
25.0000 mg | ORAL_TABLET | Freq: Two times a day (BID) | ORAL | Status: DC
  Administered 2019-11-17 – 2019-11-18 (×2): 25 mg via ORAL
  Filled 2019-11-17 (×2): qty 1

## 2019-11-17 MED ORDER — ACETAMINOPHEN 325 MG PO TABS: 650.0000 mg | ORAL_TABLET | Freq: Four times a day (QID) | ORAL | Status: DC | PRN

## 2019-11-17 MED ORDER — ACETAMINOPHEN 650 MG RE SUPP: 650.0000 mg | Freq: Four times a day (QID) | RECTAL | Status: DC | PRN

## 2019-11-17 MED ORDER — SODIUM ZIRCONIUM CYCLOSILICATE 10 G PO PACK
10.0000 g | PACK | Freq: Every day | ORAL | Status: DC
  Administered 2019-11-17: 10 g via ORAL
  Filled 2019-11-17: qty 1

## 2019-11-17 MED ORDER — SODIUM CHLORIDE 0.9 % IV BOLUS
500.0000 mL | Freq: Once | INTRAVENOUS | Status: AC
  Administered 2019-11-17: 500 mL via INTRAVENOUS

## 2019-11-17 MED ORDER — SODIUM CHLORIDE 0.9 % IV BOLUS: 1000.0000 mL | Freq: Once | INTRAVENOUS | Status: DC

## 2019-11-17 MED ORDER — FUROSEMIDE 10 MG/ML IJ SOLN
40.0000 mg | Freq: Once | INTRAMUSCULAR | Status: AC
  Administered 2019-11-17: 40 mg via INTRAVENOUS
  Filled 2019-11-17: qty 4

## 2019-11-17 MED ORDER — HEPARIN SODIUM (PORCINE) 5000 UNIT/ML IJ SOLN: 5000.0000 [IU] | Freq: Three times a day (TID) | INTRAMUSCULAR | Status: DC

## 2019-11-17 NOTE — H&P (Addendum)
[facility] Admission History and Physical Service Pager: [phone]  [name] Medical record number: [id number] Date of birth: 02-19-1961 Age: 59 y.o. Gender: male  Primary Care Provider: Danna Hefty, DO Consultants: GI Code Status: Full Preferred Emergency Contact: Johnny [name] (daughter) or Johnny [name] (sister)  Chief Complaint: GI bleed  Assessment and Plan: Johnny [name] is a 59 y.o. male presenting with GI bleed . PMHx is significant for diverticular bleed (05/2019), HTN, and OSA  Diverticular Bleed  Anemia Patient presents with 1 day of dark blood mixed in with his stool x three episodes. He presents with no abdominal or rectal pain, diarrhea, N/V. Overall he is very stable. On admission he has a Hgb of 10.4, baseline ranges from 8-10. FOBT (+) and rectal exam consistent with frank maroon blood. He presented for a diverticular bleed in April 2021, and hgb at that time was 6.4. He had a colonoscopy performed by Dr. Lyndel [name] during that admission that found a sessile polyp and pan diverticulosis with a presumed diverticular bleed that had stopped. He was recommended to follow up with GI for repeat colonoscopy in 4-6 months, but had not yet done so. GI was consulted in the ED and states presentation is consistent with a recurrent diverticular bleed. GI recommended trending Hgb with no intervention at this time. Due to GI bleed, he cannot have chemical DVT ppx, will also hold home ASA due (primary prevention) due to bleed. - Admit to Med-Surg, FPTS, attending Dr. Thompson [name] - Trend Hgb, transfuse if hgb <7 - Up with assistance - Renal Diet 1200 fluid restriction  - SCD's for DVT ppx  Possible AKI on CKD  Hyperkalemia Baseline creatinine appears to be around 3.6 with fluctuations between 3.5 and 5 since 2017. Last measurement was 3.62 on 06/18/2019. He received 500 cc in ED. Appears volume overloaded on exam due to 3+ pitting edema in  bilateral LEs up to knees, no JVD. 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 Consequently, we will not start IV fluids at this time. Patient is also mildly hyperkalemic to 5.7. No EKG changes. He has been prescribed lokelma in the past, but is currently not using it. His last potassium levels were WNL. Due to volume overloaded status, CKD, and hyperkalemia, will treat with one dose of lasix IV 40 mg and monitor urine output. Patient is likely approaching HD with eGFR <15 indicating CKD stage V; still has good urine output. Will refer him to nephrology if he has not already with close follow up in our clinic as well. 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 - 2200 BMP, AM BMP - Avoid nephrotoxic medications - Urine na and cr to calculate FeNA - IV lasix 40 mg - Monitor UOP - Outpatient follow up  HTN BP largely normotensive to mildly hypertensive today: SBP 117-166, DBP 60-96, most recently 117/32. Home medications includes coreg 25 mg BID. - Continue Coreg 25 mg BID - Monitor BP  Morbid obesity  H/O OSA Patient with BMI of 46, formerly wore c-pap, but reports he does not use this any more.  H/O Pulmonary Embolism Unprovoked in 10/2009. Patient was treated with warfarin therapy but has been off for several years now. Currently using no anticoagulation.  FEN/GI: Renal Diet 1200 fluid restriction  Prophylaxis: SCD's  Disposition: Med-Surg  History of Present Illness:  Johnny [name] is a 59 y.o. male presenting with BRBPR. Patient initially noticed this on Friday evening. His last BM prior to that was Friday am and that was normal. He noticed the blood with wiping. He denies any fevers, chills, cough, SOB, CP, n/v/d. He had been in his usual health, eating and drinking normally, denies weight loss, working out  regularly. Denies travel, known sick contacts. His brother died from COVID-30 one month ago, he did not get sick. Patient thinks that his father may have had colon cancer in his late 31s or 88s. Patient fully vaccinated, [place] March-April 2021.  Review Of Systems: Per HPI with the following additions:   Review of Systems  Constitutional: Negative for activity change, appetite change, fatigue and unexpected weight change.  Respiratory: Negative for cough, chest tightness and shortness of breath.   Cardiovascular: Positive for leg swelling (chronic). Negative for chest pain.  Gastrointestinal: Positive for blood in stool. Negative for abdominal distention, abdominal pain, constipation, diarrhea, nausea, rectal pain and vomiting.  Genitourinary: Negative for dysuria.  Neurological: Negative for weakness and headaches.     Patient Active Problem List   Diagnosis Date Noted  . GI bleed 11/17/2019  . Gastrointestinal hemorrhage 06/11/2019  . Anemia   . AKI (acute kidney injury) ([place]) 03/09/2019  . 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 Acquired lymphedema, bilateral thighs 12/13/2017  . Scrotal mass 12/13/2017  . Chronic kidney disease   . Hyperkalemia 08/27/2015  . Sleep apnea 05/21/2013  . Severe obesity (BMI >= 40) ([place]) 12/02/2011  . Healthcare maintenance 12/02/2011  . Venous stasis of lower extremity 12/02/2011  . LVH (left ventricular hypertrophy) 07/28/2009  . CKD stage 4 secondary to hypertension ([place]) 07/28/2009  . Essential hypertension 04/21/2006    Past Medical History: Past Medical History:  Diagnosis Date  . Acquired lymphedema 12/13/2017  . Chronic kidney disease    Chronic Kidney disease-stage unknown  . Diverticulosis of large intestine without hemorrhage 01/31/2012  . Essential HTN 04/21/2006  . Excess skin of thigh    left thigh adipose excess  . Hypertension   . OSA 05/21/2013   does not wear CPAP any longer  . PULMONARY EMBOLISM  11/12/2009  . Wears dentures     Past Surgical History: Past Surgical History:  Procedure Laterality Date  . ABDOMINOPLASTY/PANNICULECTOMY WITH LIPOSUCTION Left 10/11/2018   Procedure: Excision of left thigh lipodystrophy;  Surgeon: Wallace Going, DO;  Location: [place];  Service: Plastics;  Laterality: Left;  . COLONOSCOPY  01/31/2012   Procedure: COLONOSCOPY;  Surgeon: Inda Castle, MD;  Location: [facility];  Service: Endoscopy;  Laterality: N/A;  bmi is 24  . COLONOSCOPY WITH PROPOFOL N/A 06/13/2019   Procedure: COLONOSCOPY WITH PROPOFOL;  Surgeon: Jackquline Denmark, MD;  [facility];  Service: Endoscopy;  Laterality: N/A;  . MULTIPLE TOOTH EXTRACTIONS    . NO PAST SURGERIES    . PANNICULECTOMY Bilateral 04/20/2018   Procedure: Excision of right inner thighs;  Surgeon: Wallace Going, DO;  Location: [place];  Service: Plastics;  Laterality: Bilateral;  Excision of right inner thighs    Social History: Social History   Tobacco Use  . Smoking status: Never Smoker  . Smokeless tobacco: Never Used  Vaping Use  . Vaping Use: Never used  Substance Use Topics  . Alcohol use: Yes    Comment: social-once a week  . Drug use: No    Comment: stopped marijuana about Sep or Oct 2013  Additional social history:  Patient occasionally drinks alcohol, 2 beers in the last week. Never smoker Does not use recreational drugs. Please also refer to relevant sections of EMR.  Family History: Family History  Problem Relation Age of Onset  . Breast cancer Mother   . Hypertension Mother   . Colon cancer Father        died at 67  . Hypertension Brother    Allergies and Medications: Allergies  Allergen Reactions  . Lisinopril Swelling    Angioedema   No current facility-administered medications on file prior to encounter.   Current Outpatient Medications on File Prior to Encounter  Medication Sig Dispense Refill  . albuterol (VENTOLIN HFA) 108 (90 Base) MCG/ACT  inhaler Inhale 1-2 puffs into the lungs every 4 (four) hours as needed for wheezing or shortness of breath. 18 g 0  . ascorbic acid (VITAMIN C) 500 MG tablet Take 1 tablet (500 mg total) by mouth daily. 30 tablet 0  . aspirin EC 325 MG tablet Take 325 mg by mouth daily.    . carvedilol (COREG) 25 MG tablet TAKE 1 TABLET BY MOUTH TWICE DAILY WITH A MEAL (Patient taking differently: Take 25 mg by mouth 2 (two) times daily with a meal. ) 60 tablet 0  . cholecalciferol (VITAMIN D3) 25 MCG (1000 UNIT) tablet Take 1,000 Units by mouth daily.    . furosemide (LASIX) 40 MG tablet Take 1 tablet (40 mg total) by mouth daily. 10 tablet 0  . RAYALDEE 30 MCG CPCR Take 30 mcg by mouth daily.  1  . zinc sulfate 220 (50 Zn) MG capsule Take 1 capsule (220 mg total) by mouth daily. 14 capsule 0  . Nutritional Supplements (FEEDING SUPPLEMENT, NEPRO CARB STEADY,) LIQD Take 237 mLs by mouth 3 (three) times daily as needed (Supplement). (Patient not taking: Reported on 11/17/2019) 1000 mL 0  . sodium bicarbonate 650 MG tablet Take 1 tablet (650 mg total) by mouth 2 (two) times daily. (Patient not taking: Reported on 11/17/2019) 60 tablet 0  . sodium zirconium cyclosilicate (LOKELMA) 10 g PACK packet Take 10 g by mouth daily. (Patient not taking: Reported on 11/17/2019) 1 packet 0    Objective: BP 117/62 (BP Location: Right Arm)   Pulse 64   Temp 98.1 F (36.7 C) (Oral)   Resp 16   SpO2 99%  Exam: Physical Exam Vitals and nursing note reviewed.  Constitutional:      General: He is not in acute distress.    Appearance: Normal appearance. He is obese. He is not ill-appearing, toxic-appearing or diaphoretic.  HENT:     Head: Normocephalic and atraumatic.  Cardiovascular:     Rate and Rhythm: Regular rhythm. Bradycardia present.     Pulses: Normal pulses.          Radial pulses are 2+ on the right side and 2+ on the left side.       Dorsalis pedis pulses are 2+ on the right side and 2+ on the left side.     Heart  sounds: Normal heart sounds, S1 normal and S2 normal. No murmur heard.   Pulmonary:     Effort: Pulmonary effort is normal. No respiratory distress.     Breath sounds: Normal breath sounds. No wheezing.  Abdominal:     General: There is distension (due to body habitus).  Musculoskeletal:     Right lower leg: 3+ Pitting Edema present.     Left lower leg: 3+ Pitting Edema present.  Comments: Appears to be chronic due to wearing compression socks.  Neurological:     Mental Status: He is alert. Mental status is at baseline.  Psychiatric:        Mood and Affect: Mood normal.        Behavior: Behavior normal.        Thought Content: Thought content normal.      Labs and Imaging: CBC BMET  Recent Labs  Lab 11/17/19 1308  WBC 5.8  HGB 10.4*  HCT 34.9*  PLT 216   Recent Labs  Lab 11/17/19 1046  NA 135  K 5.7*  CL 108  CO2 18*  BUN 62*  CREATININE 4.28*  GLUCOSE 120*  CALCIUM 8.6*     FOBT: positive  EKG: sinus bradycardia with old t-wave abnormalities, similar to previous.  Gladys Damme, MD 11/17/2019, 11:27 PM PGY-1, [facility] pager: [phone], text pages welcome  FPTS Upper-Level Resident Addendum   I have independently interviewed and examined the patient. I have discussed the above with the original author and agree with their documentation. My edits for correction/addition/clarification are in pink. Please see also any attending notes.   Gladys Damme, M.D. PGY-2, [facility] 11/17/2019 11:27 PM  FPTS Service pager: [phone] (text pages welcome through [facility])

## 2019-11-17 NOTE — ED Triage Notes (Signed)
Patient complains of dark stools x 1 day, denies abdominal pain. Patient had colonoscopy in past for same and doesn't remember diagnosis. NAD

## 2019-11-17 NOTE — ED Provider Notes (Signed)
[facility] EMERGENCY DEPARTMENT Provider Note   CSN: [id number] Arrival date & time: 11/17/19  1018     History No chief complaint on file.   Johnny [name] is a 59 y.o. male.  HPI 59 year old male with a history of CKD unknown stage, hypertension, PE not on anticoagulation, seen and admitted to the hospital for a diverticular bleed with symptomatic anemia back in April 2021, presents to the ER with complaints of 1 day of rectal bleeding.  Patient states that he started to have several dark brown bloody bowel movements which have continued until today.  He denies any abdominal pain.  Denies any nausea or vomiting.  He is not anticoagulated and states that his prior GI bleed he was not on anticoagulation as well.  He denies any shortness of breath, weakness or dizziness.  Reports that he has not been told that he needs dialysis yet.  Has not been eating or drinking well over the last 24 hours.  He was seen by Dr. Lyndel Safe with GI per chart review on his last admission, however the patient states that he was never called or told 1 to follow-up with them.  He stated that he was told that he needs to follow-up with them in 4 months but was not told where to go for him to see.    Past Medical History:  Diagnosis Date  . Acquired lymphedema 12/13/2017  . Chronic kidney disease    Chronic Kidney disease-stage unknown  . Diverticulosis of large intestine without hemorrhage 01/31/2012  . Essential HTN 04/21/2006  . Excess skin of thigh    left thigh adipose excess  . Hypertension   . OSA 05/21/2013   does not wear CPAP any longer  . 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 Wears dentures     Patient Active Problem List   Diagnosis Date Noted  . Gastrointestinal hemorrhage 06/11/2019  . Anemia   . AKI (acute kidney injury) ([place]) 03/09/2019  . 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 Acquired lymphedema, bilateral thighs 12/13/2017  . Scrotal mass 12/13/2017  .  Chronic kidney disease   . Hyperkalemia 08/27/2015  . Sleep apnea 05/21/2013  . Severe obesity (BMI >= 40) ([place]) 12/02/2011  . Healthcare maintenance 12/02/2011  . Venous stasis of lower extremity 12/02/2011  . LVH (left ventricular hypertrophy) 07/28/2009  . CKD stage 4 secondary to hypertension ([place]) 07/28/2009  . Essential hypertension 04/21/2006    Past Surgical History:  Procedure Laterality Date  . ABDOMINOPLASTY/PANNICULECTOMY WITH LIPOSUCTION Left 10/11/2018   Procedure: Excision of left thigh lipodystrophy;  Surgeon: Wallace Going, DO;  Location: [place];  Service: Plastics;  Laterality: Left;  . COLONOSCOPY  01/31/2012   Procedure: COLONOSCOPY;  Surgeon: Inda Castle, MD;  Location: [facility] END[facility];  Service: Endoscopy;  Laterality: N/A;  bmi is 22  . COLONOSCOPY WITH PROPOFOL N/A 06/13/2019   Procedure: COLONOSCOPY WITH PROPOFOL;  Surgeon: Jackquline Denmark, MD;  Location: [facility] ENDOSCOPY;  Service: Endoscopy;  Laterality: N/A;  . MULTIPLE TOOTH EXTRACTIONS    . NO PAST SURGERIES    . PANNICULECTOMY Bilateral 04/20/2018   Procedure: Excision of right inner thighs;  Surgeon: Wallace Going, DO;  Location: [place];  Service: Plastics;  Laterality: Bilateral;  Excision of right inner thighs       Family History  Problem Relation Age of Onset  . Breast cancer Mother   . Hypertension Mother   . Colon cancer Father  died at 59  . Hypertension Brother     Social History   Tobacco Use  . Smoking status: Never Smoker  . Smokeless tobacco: Never Used  Vaping Use  . Vaping Use: Never used  Substance Use Topics  . Alcohol use: Yes    Comment: social-once a week  . Drug use: No    Comment: stopped marijuana about Sep or Oct 2013    Home Medications Prior to Admission medications   Medication Sig Start Date End Date Taking? Authorizing Provider  albuterol (VENTOLIN HFA) 108 (90 Base) MCG/ACT inhaler Inhale 1-2 puffs into the lungs every 4 (four) hours as needed  for wheezing or shortness of breath. 03/12/19  Yes Sheikh, Omair Latif, DO  ascorbic acid (VITAMIN C) 500 MG tablet Take 1 tablet (500 mg total) by mouth daily. 03/13/19  Yes Sheikh, Omair Latif, DO  aspirin EC 325 MG tablet Take 325 mg by mouth daily.   Yes [provider]  carvedilol (COREG) 25 MG tablet TAKE 1 TABLET BY MOUTH TWICE DAILY WITH A MEAL Patient taking differently: Take 25 mg by mouth 2 (two) times daily with a meal.  10/03/19  Yes Milus Banister C, DO  cholecalciferol (VITAMIN D3) 25 MCG (1000 UNIT) tablet Take 1,000 Units by mouth daily.   Yes [provider]  furosemide (LASIX) 40 MG tablet Take 1 tablet (40 mg total) by mouth daily. 10/22/15  Yes Harriet Butte, DO  RAYALDEE 30 MCG CPCR Take 30 mcg by mouth daily. 01/17/18  Yes [provider]  zinc sulfate 220 (50 Zn) MG capsule Take 1 capsule (220 mg total) by mouth daily. 03/13/19  Yes Sheikh, Omair Latif, DO  Nutritional Supplements (FEEDING SUPPLEMENT, NEPRO CARB STEADY,) LIQD Take 237 mLs by mouth 3 (three) times daily as needed (Supplement). Patient not taking: Reported on 11/17/2019 03/12/19   Raiford Noble Latif, DO  sodium bicarbonate 650 MG tablet Take 1 tablet (650 mg total) by mouth 2 (two) times daily. Patient not taking: Reported on 11/17/2019 03/12/19   Raiford Noble Latif, DO  sodium zirconium cyclosilicate (LOKELMA) 10 g PACK packet Take 10 g by mouth daily. Patient not taking: Reported on 11/17/2019 03/13/19   Raiford Noble Latif, DO    Allergies    Lisinopril  Review of Systems   Review of Systems  Constitutional: Negative for chills and fever.  HENT: Negative for ear pain and sore throat.   Eyes: Negative for pain and visual disturbance.  Respiratory: Negative for cough and shortness of breath.   Cardiovascular: Negative for chest pain and palpitations.  Gastrointestinal: Positive for anal bleeding and blood in stool. Negative for abdominal pain, nausea and vomiting.   Genitourinary: Negative for dysuria and hematuria.  Musculoskeletal: Negative for arthralgias and back pain.  Skin: Negative for color change and rash.  Neurological: Negative for seizures and syncope.  All other systems reviewed and are negative.   Physical Exam Updated Vital Signs BP 138/83   Pulse (!) 57   Temp 98.7 F (37.1 C) (Oral)   Resp (!) 26   SpO2 100%   Physical Exam Vitals and nursing note reviewed.  Constitutional:      General: He is not in acute distress.    Appearance: He is well-developed. He is obese. He is not ill-appearing, toxic-appearing or diaphoretic.  HENT:     Head: Normocephalic and atraumatic.  Eyes:     Conjunctiva/sclera: Conjunctivae normal.  Cardiovascular:     Rate and Rhythm: Normal rate and  regular rhythm.     Pulses: Normal pulses.     Heart sounds: Normal heart sounds. No murmur heard.   Pulmonary:     Effort: Pulmonary effort is normal. No respiratory distress.     Breath sounds: Normal breath sounds.  Abdominal:     General: Abdomen is flat.     Palpations: Abdomen is soft.     Tenderness: There is no abdominal tenderness.  Genitourinary:    Rectum: Guaiac result positive.     Comments: Pilar Plate dark red gross blood on rectal exam.  No visible fissures, masses Musculoskeletal:     Cervical back: Neck supple.  Skin:    General: Skin is warm and dry.     Findings: No erythema or rash.  Neurological:     General: No focal deficit present.     Mental Status: He is alert and oriented to person, place, and time.  Psychiatric:        Mood and Affect: Mood normal.        Behavior: Behavior normal.     ED Results / Procedures / Treatments   Labs (all labs ordered are listed, but only abnormal results are displayed) Labs Reviewed  COMPREHENSIVE METABOLIC PANEL - Abnormal; Notable for the following components:      Result Value   Potassium 5.7 (*)    CO2 18 (*)    Glucose, Bld 120 (*)    BUN 62 (*)    Creatinine, Ser 4.28 (*)     Calcium 8.6 (*)    Albumin 3.1 (*)    GFR calc non Af Amer 14 (*)    GFR calc Af Amer 17 (*)    All other components within normal limits  CBC - Abnormal; Notable for the following components:   RBC 4.10 (*)    Hemoglobin 10.2 (*)    HCT 34.1 (*)    MCH 24.9 (*)    MCHC 29.9 (*)    RDW 20.2 (*)    All other components within normal limits  CBC - Abnormal; Notable for the following components:   RBC 4.19 (*)    Hemoglobin 10.4 (*)    HCT 34.9 (*)    MCH 24.8 (*)    MCHC 29.8 (*)    RDW 19.9 (*)    All other components within normal limits  POC OCCULT BLOOD, ED - Abnormal; Notable for the following components:   Fecal Occult Bld POSITIVE (*)    All other components within normal limits  RESPIRATORY PANEL BY RT PCR (FLU A&B, COVID)  TYPE AND SCREEN    EKG EKG Interpretation  Date/Time:  Saturday November 17 2019 13:08:20 EDT Ventricular Rate:  58 PR Interval:    QRS Duration: 105 QT Interval:  455 QTC Calculation: 447 R Axis:   26 Text Interpretation: Sinus rhythm Anterior infarct, old Similar to prior. No STEMI Confirmed by Antony Blackbird [phone]) on 11/17/2019 1:52:57 PM   Radiology No results found.  Procedures Procedures (including critical care time)  Medications Ordered in ED Medications  sodium chloride 0.9 % bolus 500 mL (500 mLs Intravenous New Bag/Given 11/17/19 1312)    ED Course  I have reviewed the triage vital signs and the nursing notes.  Pertinent labs & imaging results that were available during my care of the patient were reviewed by me and considered in my medical decision making (see chart for details).    MDM Rules/Calculators/A&P  59 year old male with complaints of dark red blood per rectum Vital signs stable upon arrival, not hypotensive or tachycardic.  He does have a gross frank dark red blood on exam.  Abdomen is soft and nontender.  Not complaining of any dizziness, nausea, vomiting, shortness of breath.   CBC without leukocytosis, hemoglobin of 10.2 approximately 2 hours ago. Hyperkalemic with a potassium of 5.7. No EKG changes.  Patient had a rapid decline in hemoglobin at his last visit, will recheck in trend.  CMP with a potassium 5.7, patient reportedly has a history of this.  EKG without changes at this time.  Fecal occult test positive.  Patient typed and screened.  Covid pending.  Will likely consult GI after repeat CBC.  Patient given 500 cc fluid bolus for AKI.  Anticipate he will need admission for AKI, hyperkalemia and following his hemoglobin.  2:24 PM: Repeat hemoglobin stable.  Consulted Dr. Benson Norway with gastroenterology, he feels as though it is reasonable to have the patient admitted with active GI bleed in the setting of an AKI.  Will consult hospitalist for admission.  3:08PM: Spoke with family medicine hospitalist who will admit the patient for further management. GI to follow. Hemodynamically stable here in the ER  Case discussed with Dr. Sherry Ruffing who is agreeable to the above plan and disposition. Final Clinical Impression(s) / ED Diagnoses Final diagnoses:  Gastrointestinal hemorrhage, unspecified gastrointestinal hemorrhage type  Acute renal failure superimposed on chronic kidney disease, unspecified CKD stage, unspecified acute renal failure type ([place])  Hyperkalemia    Rx / DC Orders ED Discharge Orders    None       Lyndel Safe 11/17/19 1510    Tegeler, Gwenyth Allegra, MD 11/17/19 1520

## 2019-11-17 NOTE — Progress Notes (Signed)
Received patient from ED. Patient alert and oriented x4. No complaints of pain at this time. VS stable. Call bell within reach. Will continue to monitor patient.

## 2019-11-17 NOTE — Consult Note (Signed)
Reason for Consult:  Diverticular bleed Referring Physician:  [facility]  Johnny [name] HPI: This is a 59 year old male with a PMH of diverticular bleed (05/2019), CKD, HTN, and OSA admitted for complaints of hematochezia.  The bleeding started yesterday after working out.  He wanted to monitor the situation overnight and today he had two further episodes of hematochezia.  This is when he decided to present to the ER.  He states that his current presentation is the same as his April admission.  His HGB is stable at 10/4 g/dL, which is higher than his April admission.  At that time Dr. Lyndel Safe performed the colonoscopy with findings of some polyps and a pan diverticulosis.  The patient denies any issues with abdominal pain, nausea, vomiting, fever, or diarrha.  Past Medical History:  Diagnosis Date  . Acquired lymphedema 12/13/2017  . Chronic kidney disease    Chronic Kidney disease-stage unknown  . Diverticulosis of large intestine without hemorrhage 01/31/2012  . Essential HTN 04/21/2006  . Excess skin of thigh    left thigh adipose excess  . Hypertension   . OSA 05/21/2013   does not wear CPAP any longer  . 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 Wears dentures     Past Surgical History:  Procedure Laterality Date  . ABDOMINOPLASTY/PANNICULECTOMY WITH LIPOSUCTION Left 10/11/2018   Procedure: Excision of left thigh lipodystrophy;  Surgeon: Wallace Going, DO;  Location: [facility];  Service: Plastics;  Laterality: Left;  . COLONOSCOPY  01/31/2012   Procedure: COLONOSCOPY;  Surgeon: Inda Castle, MD;  Location: [facility];  Service: Endoscopy;  Laterality: N/A;  bmi is 36  . COLONOSCOPY WITH PROPOFOL N/A 06/13/2019   Procedure: COLONOSCOPY WITH PROPOFOL;  Surgeon: Jackquline Denmark, MD;  Location: [facility];  Service: Endoscopy;  Laterality: N/A;  . MULTIPLE TOOTH EXTRACTIONS    . NO PAST SURGERIES    . PANNICULECTOMY Bilateral 04/20/2018   Procedure: Excision of right inner thighs;   Surgeon: Wallace Going, DO;  Location: [place];  Service: Plastics;  Laterality: Bilateral;  Excision of right inner thighs    Family History  Problem Relation Age of Onset  . Breast cancer Mother   . Hypertension Mother   . Colon cancer Father        died at 10  . Hypertension Brother     Social History:  reports that he has never smoked. He has never used smokeless tobacco. He reports current alcohol use. He reports that he does not use drugs.  Allergies:  Allergies  Allergen Reactions  . Lisinopril Swelling    Angioedema    Medications:  Scheduled: . carvedilol  25 mg Oral BID WC  . heparin  5,000 Units Subcutaneous Q8H  . sodium zirconium cyclosilicate  10 g Oral Daily   Continuous:   Results for orders placed or performed during the hospital encounter of 11/17/19 (from the past 24 hour(s))  Comprehensive metabolic panel     Status: Abnormal   Collection Time: 11/17/19 10:46 AM  Result Value Ref Range   Sodium 135 135 - 145 mmol/L   Potassium 5.7 (H) 3.5 - 5.1 mmol/L   Chloride 108 98 - 111 mmol/L   CO2 18 (L) 22 - 32 mmol/L   Glucose, Bld 120 (H) 70 - 99 mg/dL   BUN 62 (H) 6 - 20 mg/dL   Creatinine, Ser 4.28 (H) 0.61 - 1.24 mg/dL   Calcium 8.6 (L) 8.9 - 10.3 mg/dL   Total Protein  6.8 6.5 - 8.1 g/dL   Albumin 3.1 (L) 3.5 - 5.0 g/dL   AST 18 15 - 41 U/L   ALT 12 0 - 44 U/L   Alkaline Phosphatase 52 38 - 126 U/L   Total Bilirubin 0.5 0.3 - 1.2 mg/dL   GFR calc non Af Amer 14 (L) >60 mL/min   GFR calc Af Amer 17 (L) >60 mL/min   Anion gap 9 5 - 15  CBC     Status: Abnormal   Collection Time: 11/17/19 10:46 AM  Result Value Ref Range   WBC 6.1 4.0 - 10.5 K/uL   RBC 4.10 (L) 4.22 - 5.81 MIL/uL   Hemoglobin 10.2 (L) 13.0 - 17.0 g/dL   HCT 34.1 (L) 39 - 52 %   MCV 83.2 80.0 - 100.0 fL   MCH 24.9 (L) 26.0 - 34.0 pg   MCHC 29.9 (L) 30.0 - 36.0 g/dL   RDW 20.2 (H) 11.5 - 15.5 %   Platelets 227 150 - 400 K/uL   nRBC 0.0 0.0 - 0.2 %  Type and screen [place]     Status: None   Collection Time: 11/17/19 10:46 AM  Result Value Ref Range   ABO/RH(D) A POS    Antibody Screen NEG    Sample Expiration      11/20/2019,2359 Performed at [facility], [place] [address], [place], [place] [address]   POC occult blood, ED     Status: Abnormal   Collection Time: 11/17/19 12:52 PM  Result Value Ref Range   Fecal Occult Bld POSITIVE (A) NEGATIVE  CBC     Status: Abnormal   Collection Time: 11/17/19  1:08 PM  Result Value Ref Range   WBC 5.8 4.0 - 10.5 K/uL   RBC 4.19 (L) 4.22 - 5.81 MIL/uL   Hemoglobin 10.4 (L) 13.0 - 17.0 g/dL   HCT 34.9 (L) 39 - 52 %   MCV 83.3 80.0 - 100.0 fL   MCH 24.8 (L) 26.0 - 34.0 pg   MCHC 29.8 (L) 30.0 - 36.0 g/dL   RDW 19.9 (H) 11.5 - 15.5 %   Platelets 216 150 - 400 K/uL   nRBC 0.0 0.0 - 0.2 %  Respiratory Panel by RT PCR (Flu A&B, Covid) - Nasopharyngeal Swab     Status: None   Collection Time: 11/17/19  1:35 PM   Specimen: Nasopharyngeal Swab  Result Value Ref Range   SARS Coronavirus 2 by RT PCR NEGATIVE NEGATIVE   Influenza A by PCR NEGATIVE NEGATIVE   Influenza B by PCR NEGATIVE NEGATIVE     No results found.  ROS:  As stated above in the HPI otherwise negative.  Blood pressure (!) 166/96, pulse (!) 58, temperature (!) 97 F (36.1 C), temperature source Axillary, resp. rate (!) 23, SpO2 99 %.    PE: Gen: NAD, Alert and Oriented HEENT:  Orient/AT, EOMI Neck: Supple, no LAD Lungs: CTA Bilaterally CV: RRR without M/G/R ABD: Soft, NTND, +BS Ext: No C/C/E  Assessment/Plan: 1) Recurrent diverticular bleed. 2) Anemia. 3) CKD.   The patient is stable and his current presentation is consistent with a recurrent diverticular bleed.  His HGB is currently higher than his prior admission.  No GI intervention is necessary at this time.  Plan: 1) Follow HGB. 2) Transfuse as necessary.  Micalah Cabezas D 11/17/2019, 4:26 PM

## 2019-11-18 ENCOUNTER — Other Ambulatory Visit: Payer: Self-pay | Admitting: Family Medicine

## 2019-11-18 DIAGNOSIS — E875 Hyperkalemia: Secondary | ICD-10-CM

## 2019-11-18 DIAGNOSIS — K922 Gastrointestinal hemorrhage, unspecified: Secondary | ICD-10-CM | POA: Diagnosis not present

## 2019-11-18 DIAGNOSIS — K5791 Diverticulosis of intestine, part unspecified, without perforation or abscess with bleeding: Secondary | ICD-10-CM

## 2019-11-18 DIAGNOSIS — N184 Chronic kidney disease, stage 4 (severe): Secondary | ICD-10-CM

## 2019-11-18 LAB — BASIC METABOLIC PANEL
Anion gap: 10 (ref 5–15)
Anion gap: 9 (ref 5–15)
BUN: 60 mg/dL — ABNORMAL HIGH (ref 6–20)
BUN: 59 mg/dL — ABNORMAL HIGH (ref 6–20)
CO2: 21 mmol/L — ABNORMAL LOW (ref 22–32)
CO2: 19 mmol/L — ABNORMAL LOW (ref 22–32)
Calcium: 8.5 mg/dL — ABNORMAL LOW (ref 8.9–10.3)
Calcium: 8.6 mg/dL — ABNORMAL LOW (ref 8.9–10.3)
Chloride: 106 mmol/L (ref 98–111)
Chloride: 107 mmol/L (ref 98–111)
Creatinine, Ser: 4.11 mg/dL — ABNORMAL HIGH (ref 0.61–1.24)
Creatinine, Ser: 4.06 mg/dL — ABNORMAL HIGH (ref 0.61–1.24)
GFR calc Af Amer: 17 mL/min — ABNORMAL LOW (ref >60)
GFR calc Af Amer: 18 mL/min — ABNORMAL LOW (ref >60)
GFR calc non Af Amer: 15 mL/min — ABNORMAL LOW (ref >60)
GFR calc non Af Amer: 15 mL/min — ABNORMAL LOW (ref >60)
Glucose, Bld: 126 mg/dL — ABNORMAL HIGH (ref 70–99)
Glucose, Bld: 112 mg/dL — ABNORMAL HIGH (ref 70–99)
Potassium: 4.4 mmol/L (ref 3.5–5.1)
Potassium: 4.9 mmol/L (ref 3.5–5.1)
Sodium: 137 mmol/L (ref 135–145)
Sodium: 135 mmol/L (ref 135–145)

## 2019-11-18 LAB — CBC
HCT: 33.3 % — ABNORMAL LOW (ref 39.0–52.0)
Hemoglobin: 10.4 g/dL — ABNORMAL LOW (ref 13.0–17.0)
MCH: 25.9 pg — ABNORMAL LOW (ref 26.0–34.0)
MCHC: 31.2 g/dL (ref 30.0–36.0)
MCV: 82.8 fL (ref 80.0–100.0)
Platelets: 233 10*3/uL (ref 150–400)
RBC: 4.02 MIL/uL — ABNORMAL LOW (ref 4.22–5.81)
RDW: 19.9 % — ABNORMAL HIGH (ref 11.5–15.5)
WBC: 5.6 10*3/uL (ref 4.0–10.5)
nRBC: 0 % (ref 0.0–0.2)

## 2019-11-18 NOTE — Progress Notes (Signed)
Family Medicine Teaching Service Daily Progress Note Intern Pager: [phone]  [name] Medical record number: [id number] Date of birth: [birth year] Age: 59 y.o. Gender: male  Primary Care Provider: Danna Hefty, DO Consultants: GI Code Status: Full  Pt Overview and Major Events to Date:  Johnny [name] is a 59 year old male presenting with GI bleed.  PMH significant for diverticular bleed (05/2019), HTN, and OSA   Assessment and Plan:  Diverticular bleed  anemia Patient admitted with hematochezia and positive FOBT.  Patient had similar presentation in April 2021.  Vital signs stable.  Hemoglobin 10.4.  Due to GI bleed, cannot have chemical DVT prophylaxis, also holding home ASA (primary prevention) -Trend Hgb, transfusion threshold is 7 -GI consulted, currently no intervention recommended -Continue to hold home ASA  -Continue to monitor H&H  AKI on CKD  Creatinine 4.11>4.06, BUN 60> 59, GFR 15.  Patient was given 1 dose of IV Lasix 40 mg.  UOP last 24 hours 1.1 mL/kg/hr with total output of 3150 mL.  FENa measured at 9.6%, post renal/obstructive.  -Continue to monitor with BMPs -Avoid nephrotoxic medications -Continue to monitor UOP  HTN Patient currently normotensive. Home medications include Coreg 25 mg twice daily -Continue Coreg 25 mg twice daily -Monitor BP  Morbid obesity  Hx OSA BMI 48.35.  Does not use CPAP.   Hx pulmonary embolism Unprovoked DVT in 2011, patient was treated with warfarin.  Patient currently not on anticoagulation.  Hyperkalemia, resolved  FEN/GI: Renal diet, 1200 fluid restriction PPx: SCDs   Status is: Observation  The patient remains OBS appropriate and will d/c before 2 midnights.  Dispo: The patient is from: Home              Anticipated d/c is to: Home              Anticipated d/c date is: 1 day              Patient currently is medically stable to d/c.    Subjective:  Patient states that he is ready to go  home and he has no complaints this morning. He states that he did have a bowel movement earlier that he did not notice any blood in the toilet bowl but did have some when he wiped.  Objective: Temp:  [97 F (36.1 C)-98.7 F (37.1 C)] 98.5 F (36.9 C) (09/26 0511) Pulse Rate:  [57-64] 63 (09/26 0511) Resp:  [15-26] 16 (09/26 0511) BP: (112-166)/(60-96) 126/78 (09/26 0511) SpO2:  [98 %-100 %] 99 % (09/26 0511) Weight:  [148.5 kg] 148.5 kg (09/26 0511) Physical Exam: General: NAD, obese,  nontoxic appearance Cardiovascular: With heart rate in the 60s, no M/R/G appreciated, 2+ peripheral pulses Respiratory: CTAB, no increased work of breathing, no respiratory distress Abdomen: Obese, nontender, bowel sounds present in all quadrants Extremities: SCDs on, trace edema  Laboratory: Recent Labs  Lab 11/17/19 1046 11/17/19 1308 11/18/19 0542  WBC 6.1 5.8 5.6  HGB 10.2* 10.4* 10.4*  HCT 34.1* 34.9* 33.3*  PLT 227 216 233   Recent Labs  Lab 11/17/19 1046 11/17/19 2327  NA 135 137  K 5.7* 4.4  CL 108 106  CO2 18* 21*  BUN 62* 60*  CREATININE 4.28* 4.11*  CALCIUM 8.6* 8.5*  PROT 6.8  --   BILITOT 0.5  --   ALKPHOS 52  --   ALT 12  --   AST 18  --   GLUCOSE 120* 126*      Imaging/Diagnostic  Tests: No results found.   Rise Patience, DO 11/18/2019, 6:57 AM PGY-1, [place] Intern pager: [phone], text pages welcome

## 2019-11-18 NOTE — Progress Notes (Signed)
Patient discharged to home. Verbalizes understanding of all discharge instructions including follow up MD visits. GI referral made for patient and he is aware he needs to call office to schedule appointment for possible colonoscopy.

## 2019-11-18 NOTE — Progress Notes (Signed)
Subjective: Feeling well.  He did have a bowel movement and he only saw blood on the toilet tissue.  Objective: Vital signs in last 24 hours: Temp:  [97 F (36.1 C)-98.7 F (37.1 C)] 98.5 F (36.9 C) (09/26 0511) Pulse Rate:  [57-64] 63 (09/26 0511) Resp:  [15-26] 16 (09/26 0511) BP: (112-166)/(60-96) 126/78 (09/26 0511) SpO2:  [98 %-100 %] 99 % (09/26 0511) Weight:  [148.5 kg] 148.5 kg (09/26 0511) Last BM Date: 11/17/19  Intake/Output from previous day: 09/25 0701 - 09/26 0700 In: 965 [P.O.:480; IV Piggyback:485] Out: 3150 [Urine:3150] Intake/Output this shift: Total I/O In: -  Out: 250 [Urine:250]  General appearance: alert and no distress Resp: clear to auscultation bilaterally Cardio: regular rate and rhythm GI: soft, non-tender; bowel sounds normal; no masses,  no organomegaly Extremities: extremities normal, atraumatic, no cyanosis or edema  Lab Results: Recent Labs    11/17/19 1046 11/17/19 1308 11/18/19 0542  WBC 6.1 5.8 5.6  HGB 10.2* 10.4* 10.4*  HCT 34.1* 34.9* 33.3*  PLT 227 216 233   BMET Recent Labs    11/17/19 1046 11/17/19 2327 11/18/19 0542  NA 135 137 135  K 5.7* 4.4 4.9  CL 108 106 107  CO2 18* 21* 19*  GLUCOSE 120* 126* 112*  BUN 62* 60* 59*  CREATININE 4.28* 4.11* 4.06*  CALCIUM 8.6* 8.5* 8.6*   LFT Recent Labs    11/17/19 1046  PROT 6.8  ALBUMIN 3.1*  AST 18  ALT 12  ALKPHOS 52  BILITOT 0.5   PT/INR No results for input(s): LABPROT, INR in the last 72 hours. Hepatitis Panel No results for input(s): HEPBSAG, HCVAB, HEPAIGM, HEPBIGM in the last 72 hours. C-Diff No results for input(s): CDIFFTOX in the last 72 hours. Fecal Lactopherrin No results for input(s): FECLLACTOFRN in the last 72 hours.  Studies/Results: No results found.  Medications:  Scheduled: . carvedilol  25 mg Oral BID WC   Continuous:   Assessment/Plan: 1) Diverticular bleed. 2) Anemia.   His HGB is stable and he feels well.  He reported  some mild smearing of blood on the toilet tissue.    Plan: 1) Monitor today and recheck a CBC in the afternoon. 2) If stable, he can be discharged home with close GI follow up - Dr. Lyndel Safe.  LOS: 0 days   Oliviana Mcgahee D 11/18/2019, 8:58 AM

## 2019-11-18 NOTE — Discharge Instructions (Signed)
It was so great to be the team taking care of you during your hospital stay. You were hospitalized for bleeding from your colon (diverticular bleeding), you were also found to have electrolyte imbalances, with a high potassium, and a kidney injury.  You were treated with IV fluids, Lasix for GI bleed we checked your blood counts, they stayed stable during your stay so we will have you follow-up in our clinic on 11/18/2019 at 9:30am for a blood work check.  You have an appointment with the [facility] lab on 11/19/2019 at 9:30 AM, please show up 10 minutes early.  We recommend the following: 1.  Follow-up with your PCP to discuss your labs. 2.  Call your nephrologist (kidney doctor) to see if you can get your appointment scheduled sooner. 3.  Make an appointment with Dr. Lyndel Safe (your gastroenterologist) as soon as possible for a follow-up colonoscopy.

## 2019-11-18 NOTE — Discharge Summary (Addendum)
[facility] Discharge Summary  [name] Medical record number: [id number] Date of birth: 03/23/1960 Age: 59 y.o. 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   Acquired lymphedema, bilateral thighs 12/13/2017   Scrotal mass 12/13/2017   Chronic kidney disease    Hyperkalemia 08/27/2015   Sleep apnea 05/21/2013   Severe obesity (BMI >= 40) ([place]) 12/02/2011   Healthcare maintenance 12/02/2011   Venous stasis of lower extremity 12/02/2011   LVH (left ventricular hypertrophy) 07/28/2009   CKD stage 4 secondary to hypertension ([place]) 07/28/2009   Essential hypertension 04/21/2006   Disposition: Home  Discharge Condition: Stable, improved  Discharge Exam:  General: NAD, obese,  nontoxic appearance Cardiovascular: With heart rate in the 60s, no M/R/G appreciated, 2+ peripheral pulses Respiratory: CTAB, no increased work of breathing, no respiratory distress Abdomen: Obese, nontender, bowel sounds present in all quadrants Extremities: SCDs on, trace edema  Brief Hospital Course:  Johnny [name] is a 59 year old male that was admitted for diverticular bleed and AKI.  Diverticular bleed Patient was admitted with several episodes of hematochezia, hemoglobin found to be 10.4.  GI was consulted, no intervention was needed as hemoglobin stayed stable over 10 during entire admission. Patient remained stable during the admission, and was stable for discharge with close  follow-up per gastroenterology.  AKI on CKD  hyperkalemia Patient has baseline CKD stage V with a GFR under 15, still producing good urine.  Patient was noted to have hyperkalemia and increased creatinine and BUN on admission.  Patient was given IV Lasix 40 mg for hyperkalemia to 5.7 and volume overloaded status (lymphedema in b/l legs).  Patient then had over 3 L of urine output.  Kidney injury was improving on discharge.  Hypertension Blood pressures remained mostly normotensive during admission.  Some elevated pressure readings in the 188C systolic were noted.  Home medication of Coreg 25 mg twice daily was continued during hospitalization.  Issues for Follow Up:  Recommend PCP follow-up for hemoglobin and BMP check to ensure no active signs of bleeding and improvement of kidney injury. Nephrology appointment needed sooner than November as currently scheduled.  Patient GFR under 15 indicating CKD stage V, still has good urine output. Patient needs 1-2 week recheck of his creatinine. Follow-up needed with Dr. Lyndel Safe for repeat colonoscopy.  Significant Procedures: None  Significant Labs and Imaging:  Recent Labs  Lab 11/17/19 1046 11/17/19 1308 11/18/19 0542  WBC 6.1 5.8 5.6  HGB 10.2* 10.4* 10.4*  HCT 34.1* 34.9* 33.3*  PLT 227 216 233   Recent Labs  Lab 11/17/19 1046 11/17/19 1046 11/17/19 2327 11/18/19 0542  NA 135  --  137 135  K 5.7*   < > 4.4 4.9  CL 108  --  106 107  CO2 18*  --  21* 19*  GLUCOSE 120*  --  126* 112*  BUN 62*  --  60* 59*  CREATININE 4.28*  --  4.11* 4.06*  CALCIUM 8.6*  --  8.5* 8.6*  ALKPHOS 52  --   --   --  AST 18  --   --   --   ALT 12  --   --   --   ALBUMIN 3.1*  --   --   --    < > = values in this interval not displayed.      Results/Tests Pending at Time of Discharge: None  Discharge Medications:  Allergies as of 11/18/2019       Reactions   Lisinopril Swelling   Angioedema        Medication List     STOP taking these  medications    aspirin EC 325 MG tablet   feeding supplement (NEPRO CARB STEADY) Liqd       TAKE these medications    albuterol 108 (90 Base) MCG/ACT inhaler Commonly known as: VENTOLIN HFA Inhale 1-2 puffs into the lungs every 4 (four) hours as needed for wheezing or shortness of breath.   ascorbic acid 500 MG tablet Commonly known as: VITAMIN C Take 1 tablet (500 mg total) by mouth daily.   carvedilol 25 MG tablet Commonly known as: COREG TAKE 1 TABLET BY MOUTH TWICE DAILY WITH A MEAL What changed: See the new instructions.   cholecalciferol 25 MCG (1000 UNIT) tablet Commonly known as: VITAMIN D3 Take 1,000 Units by mouth daily.   furosemide 40 MG tablet Commonly known as: LASIX Take 1 tablet (40 mg total) by mouth daily.   Rayaldee 30 MCG Cpcr Generic drug: Calcifediol ER Take 30 mcg by mouth daily.   sodium bicarbonate 650 MG tablet Take 1 tablet (650 mg total) by mouth 2 (two) times daily.   sodium zirconium cyclosilicate 10 g Pack packet Commonly known as: LOKELMA Take 10 g by mouth daily.   zinc sulfate 220 (50 Zn) MG capsule Take 1 capsule (220 mg total) by mouth daily.        Discharge Instructions: Please refer to Patient Instructions section of EMR for full details.  Patient was counseled important signs and symptoms that should prompt return to medical care, changes in medications, dietary instructions, activity restrictions, and follow up appointments.   Follow-Up Appointments:  1. Lab appointment for blood work at [facility] on 11/19/2019 at 09:30AM.     Rise Patience, DO 11/18/2019, 3:24 PM PGY-1, [place]

## 2019-11-18 NOTE — Hospital Course (Addendum)
Johnny [name] is a 59 year old male that was admitted for diverticular bleed and AKI.  Diverticular bleed Patient was admitted with several episodes of hematochezia, hemoglobin found to be 10.4.  GI was consulted, no intervention was needed as hemoglobin stayed stable over 10 during entire admission.  Due to GI bleed patient was placed on SCDs for DVT prophylaxis.  AKI on CKD Patient has baseline CKD stage V with a GFR under 15, still producing good urine.  Patient was noted to have hyperkalemia and increased creatinine and BUN on admission.  Patient was initially given IVF, and then appeared fluid overloaded and patient was subsequently given IV Lasix 40 mg.  Patient then had over 3 L of urine output.  Kidney injury was improving on discharge.  Hypertension Blood pressures remained mostly normotensive during admission.  Some elevated pressure readings in the 211Z systolic were noted.  Home medication of Coreg 25 mg twice daily was continued during hospitalization.    Issues for follow-up -PCP follow-up for hemoglobin and BMP check. -Nephrology appointment needed sooner than November as currently scheduled.  Patient GFR under 15 indicating CKD stage V, still has good urine output.    Referral to nephrology due to GFR under 15, indicating CKD stage V, with good urine output still.

## 2019-11-19 ENCOUNTER — Other Ambulatory Visit: Payer: Self-pay

## 2019-11-19 ENCOUNTER — Other Ambulatory Visit (INDEPENDENT_AMBULATORY_CARE_PROVIDER_SITE_OTHER): Payer: Medicaid Other

## 2019-11-19 DIAGNOSIS — K5791 Diverticulosis of intestine, part unspecified, without perforation or abscess with bleeding: Secondary | ICD-10-CM

## 2019-11-19 DIAGNOSIS — I129 Hypertensive chronic kidney disease with stage 1 through stage 4 chronic kidney disease, or unspecified chronic kidney disease: Secondary | ICD-10-CM

## 2019-11-19 DIAGNOSIS — N184 Chronic kidney disease, stage 4 (severe): Secondary | ICD-10-CM | POA: Diagnosis not present

## 2019-11-19 LAB — POCT HEMOGLOBIN: Hemoglobin: 9.8 g/dL — AB (ref 11–14.6)

## 2019-11-20 LAB — BASIC METABOLIC PANEL
BUN/Creatinine Ratio: 16 (ref 9–20)
BUN: 57 mg/dL — ABNORMAL HIGH (ref 6–24)
CO2: 17 mmol/L — ABNORMAL LOW (ref 20–29)
Calcium: 8.7 mg/dL (ref 8.7–10.2)
Chloride: 107 mmol/L — ABNORMAL HIGH (ref 96–106)
Creatinine, Ser: 3.56 mg/dL — ABNORMAL HIGH (ref 0.76–1.27)
GFR calc Af Amer: 21 mL/min/{1.73_m2} — ABNORMAL LOW (ref >59)
GFR calc non Af Amer: 18 mL/min/{1.73_m2} — ABNORMAL LOW (ref >59)
Glucose: 108 mg/dL — ABNORMAL HIGH (ref 65–99)
Potassium: 5.3 mmol/L — ABNORMAL HIGH (ref 3.5–5.2)
Sodium: 137 mmol/L (ref 134–144)

## 2019-12-03 ENCOUNTER — Other Ambulatory Visit: Payer: Self-pay | Admitting: Family Medicine

## 2019-12-04 ENCOUNTER — Other Ambulatory Visit: Payer: Self-pay | Admitting: Family Medicine

## 2019-12-04 ENCOUNTER — Other Ambulatory Visit: Payer: Self-pay

## 2019-12-04 ENCOUNTER — Encounter: Payer: Self-pay | Admitting: Family Medicine

## 2019-12-04 ENCOUNTER — Other Ambulatory Visit: Payer: Medicaid Other

## 2019-12-04 DIAGNOSIS — I129 Hypertensive chronic kidney disease with stage 1 through stage 4 chronic kidney disease, or unspecified chronic kidney disease: Secondary | ICD-10-CM

## 2019-12-04 DIAGNOSIS — D649 Anemia, unspecified: Secondary | ICD-10-CM | POA: Diagnosis not present

## 2019-12-04 DIAGNOSIS — N184 Chronic kidney disease, stage 4 (severe): Secondary | ICD-10-CM

## 2019-12-05 LAB — BASIC METABOLIC PANEL
BUN/Creatinine Ratio: 13 (ref 9–20)
BUN: 48 mg/dL — ABNORMAL HIGH (ref 6–24)
CO2: 17 mmol/L — ABNORMAL LOW (ref 20–29)
Calcium: 8.7 mg/dL (ref 8.7–10.2)
Chloride: 107 mmol/L — ABNORMAL HIGH (ref 96–106)
Creatinine, Ser: 3.83 mg/dL — ABNORMAL HIGH (ref 0.76–1.27)
GFR calc Af Amer: 19 mL/min/{1.73_m2} — ABNORMAL LOW (ref >59)
GFR calc non Af Amer: 16 mL/min/{1.73_m2} — ABNORMAL LOW (ref >59)
Glucose: 101 mg/dL — ABNORMAL HIGH (ref 65–99)
Potassium: 4.8 mmol/L (ref 3.5–5.2)
Sodium: 139 mmol/L (ref 134–144)

## 2019-12-05 LAB — HEMOGLOBIN AND HEMATOCRIT, BLOOD
Hematocrit: 31.3 % — ABNORMAL LOW (ref 37.5–51.0)
Hemoglobin: 9.7 g/dL — ABNORMAL LOW (ref 13.0–17.7)

## 2019-12-31 ENCOUNTER — Ambulatory Visit: Payer: Medicaid Other | Admitting: Family Medicine

## 2019-12-31 ENCOUNTER — Other Ambulatory Visit: Payer: Self-pay

## 2019-12-31 VITALS — BP 143/80 | HR 70 | Ht 69.0 in | Wt 331.0 lb

## 2019-12-31 DIAGNOSIS — Z23 Encounter for immunization: Secondary | ICD-10-CM

## 2019-12-31 DIAGNOSIS — N184 Chronic kidney disease, stage 4 (severe): Secondary | ICD-10-CM | POA: Diagnosis not present

## 2019-12-31 DIAGNOSIS — E875 Hyperkalemia: Secondary | ICD-10-CM | POA: Diagnosis not present

## 2019-12-31 DIAGNOSIS — Z13220 Encounter for screening for lipoid disorders: Secondary | ICD-10-CM | POA: Diagnosis not present

## 2019-12-31 DIAGNOSIS — I129 Hypertensive chronic kidney disease with stage 1 through stage 4 chronic kidney disease, or unspecified chronic kidney disease: Secondary | ICD-10-CM

## 2019-12-31 DIAGNOSIS — K5791 Diverticulosis of intestine, part unspecified, without perforation or abscess with bleeding: Secondary | ICD-10-CM

## 2019-12-31 DIAGNOSIS — D649 Anemia, unspecified: Secondary | ICD-10-CM | POA: Diagnosis not present

## 2019-12-31 DIAGNOSIS — I10 Essential (primary) hypertension: Secondary | ICD-10-CM | POA: Diagnosis not present

## 2019-12-31 NOTE — Assessment & Plan Note (Addendum)
Elevated. Notes home BP's are more normotensive. Will continue current medications at this time and continue to monitor.  - Encouraged weight loss.  - History of angioedema with ACE/ARB. - can consider addition of amlodipine if persists

## 2019-12-31 NOTE — Assessment & Plan Note (Signed)
Resolved on last BMP. - repeat BMP today to monitor - not taking lokelma

## 2019-12-31 NOTE — Assessment & Plan Note (Signed)
Continues to gain weight. Patient has had some stressors influencing his weight. He is motivated to loose weight. Reestablishing gym habit; has membership at [facility] with plan to go today. - continue to monitor - consider referral to bariatric surgery and Weight management clinic. Will discuss at follow up visit.

## 2019-12-31 NOTE — Assessment & Plan Note (Signed)
History of diverticular bleed x 2 in April and September 2021. Last colonoscopy in 05/2019 with evidence of pancolonic diverticulosis primarily in sigmoid colon. Recommendation for repeat colonoscopy in 3-6 months +/- RBC bleeding scan +/- mesenteric angiography. No acute bleed.  - cbc to monitor Hgb - Referral to GI for repeat colonoscopy - patient instruct to have GI request records from [facility] in order to schedule follow up appt. He voiced understanding and agreement with plan

## 2019-12-31 NOTE — Assessment & Plan Note (Signed)
Chronic. BMP today to monitor. Has nephrology appointment on 01/04/20. Avoid nephrotoxic agents.

## 2019-12-31 NOTE — Progress Notes (Signed)
Subjective:   Patient ID: Johnny [name]    DOB: [birth year], 59 y.o. male   MRN: [id number]  Johnny [name] is a 59 y.o. 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  H/o Diverticular bleed:  Patient hospitalized from 9/25 to 9/26 for diverticular bleed. Hgb stable at 9.7 on 12/04/19. Patient to follow up with Dr. Lyndel Safe for repeat colonoscopy. H/o diverticular bleed from right colonic diverticulosis based on last colonoscopy in 05/2019 with evidence of pancolonic diverticulosis primarily in sigmoid colon. Recommendation for repeat colonoscopy in 3-6 months +/- RBC bleeding scan +/- mesenteric angiography.  Patient notes he has not scheduled appointment for colonoscopy. Denies any hematochezia or melena.  CKD IV  Hyperkalemia: Last Cr 3.83 in oct. 2021. Established with nephrology on 11/12.    HTN:  BP: (!) 143/80 today. Currently on Coreg 25mg  BID. Endorses compliance. Non-smoker. Denies any chest pain, SOB, vision changes, or headaches.  History of angioedema with ACE. BP's at home are normally 130/80s. Weight gain since last visit. Has had a lot of home stressors (lost twin brother 2 months ago). Starting to go back to the gym.   HLD: Last lipid panel below. Not currently on any lipid lowering medications.   Lab Results  Component Value Date   CHOL 170 03/13/2013   HDL 49 03/13/2013   LDLCALC 99 03/13/2013   LDLDIRECT 94 04/06/2007   TRIG 112 03/13/2013   CHOLHDL 3.5 03/13/2013   Review of Systems:  Per HPI.   Objective:   BP (!) 143/80   Pulse 70   Ht 5\' 9"  (1.753 m)   Wt (!) 331 lb (150.1 kg)   SpO2 100%   BMI 48.88 kg/m  Vitals and nursing note reviewed.  General: pleasant morbidly obese male, sitting comfortably in exam chair, well nourished, well developed, in no acute distress with non-toxic appearance HEENT:  normocephalic, atraumatic, moist mucous membranes, oropharynx clear without erythema or exudate Neck: supple, non-tender without lymphadenopathy CV: regular rate and rhythm without murmurs, rubs, or gallops, 2+ radial and pedal pulses bilaterally Lungs: clear to auscultation bilaterally with normal work of breathing on room air, speaking in full sentences Skin: warm, dry MSK: gait normal Neuro: Alert and oriented, speech normal  Assessment & Plan:   Essential hypertension Elevated. Notes home BP's are more normotensive. Will continue current medications at this time and continue to monitor.  - Encouraged weight loss.  - History of angioedema with ACE/ARB. - can consider addition of amlodipine if persists  CKD stage 4 secondary to hypertension (HCC) Chronic. BMP today to monitor. Has nephrology appointment on 01/04/20. Avoid nephrotoxic agents.   Hyperkalemia Resolved on last BMP. - repeat BMP today to monitor - not taking lokelma  Gastrointestinal hemorrhage History of diverticular bleed x 2 in April and September 2021. Last colonoscopy in 05/2019 with evidence of pancolonic diverticulosis primarily in sigmoid colon. Recommendation for repeat colonoscopy in 3-6 months +/- RBC bleeding scan +/- mesenteric angiography. No acute bleed.  - cbc to monitor Hgb - Referral to GI for repeat colonoscopy - patient instruct to have GI request records from [facility] in order to schedule follow up appt. He voiced understanding and agreement with plan  Morbid obesity ([place]) Continues to gain weight. Patient has had some stressors influencing his weight. He is motivated to loose weight. Reestablishing gym habit; has membership at [facility] with plan to  go today. - continue to monitor - consider referral to bariatric surgery and Weight management clinic. Will discuss at follow up visit.  Screen for HLD: Last lipid panel in 2015. Morbid obesity. No on cholesterol lowering medication. - lipid panel  today  Orders Placed This Encounter  Procedures  . CBC  . Basic Metabolic Panel  . Lipid Panel  . Ambulatory referral to Gastroenterology    Referral Priority:   Routine    Referral Type:   Consultation    Referral Reason:   Specialty Services Required    Number of Visits Requested:   1   No orders of the defined types were placed in this encounter.   Mina Marble, DO PGY-3, [facility] 12/31/2019 11:34 AM

## 2019-12-31 NOTE — Patient Instructions (Signed)
It was a pleasure to see you today!  Thank you for choosing [facility] for your primary care.  Johnny [name] was seen for follow up  Our plans for today were:  Please be sure to follow up with the kidney doctor as scheduled  I have obtained blood work to monitor your hemoglobin (anemia) and kidney function  Please return to the GI doctor's office to fill out a Release of Records form to allow them to obtain records from [place].   Please try to schedule colonoscopy at earliest convenience.  To keep you healthy, please keep in mind the following health maintenance items that you are due for:   1. Flu    You should return to our clinic in 3 months, sooner if needed.  Best Wishes,   Mina Marble, DO

## 2020-01-01 LAB — LIPID PANEL
Chol/HDL Ratio: 3.5 ratio (ref 0.0–5.0)
Cholesterol, Total: 159 mg/dL (ref 100–199)
HDL: 46 mg/dL (ref >39)
LDL Chol Calc (NIH): 92 mg/dL (ref 0–99)
Triglycerides: 115 mg/dL (ref 0–149)
VLDL Cholesterol Cal: 21 mg/dL (ref 5–40)

## 2020-01-01 LAB — CBC
Hematocrit: 30.9 % — ABNORMAL LOW (ref 37.5–51.0)
Hemoglobin: 9.7 g/dL — ABNORMAL LOW (ref 13.0–17.7)
MCH: 25.5 pg — ABNORMAL LOW (ref 26.6–33.0)
MCHC: 31.4 g/dL — ABNORMAL LOW (ref 31.5–35.7)
MCV: 81 fL (ref 79–97)
Platelets: 249 10*3/uL (ref 150–450)
RBC: 3.8 x10E6/uL — ABNORMAL LOW (ref 4.14–5.80)
RDW: 17.8 % — ABNORMAL HIGH (ref 11.6–15.4)
WBC: 5.8 10*3/uL (ref 3.4–10.8)

## 2020-01-01 LAB — BASIC METABOLIC PANEL
BUN/Creatinine Ratio: 15 (ref 9–20)
BUN: 60 mg/dL — ABNORMAL HIGH (ref 6–24)
CO2: 17 mmol/L — ABNORMAL LOW (ref 20–29)
Calcium: 8.6 mg/dL — ABNORMAL LOW (ref 8.7–10.2)
Chloride: 108 mmol/L — ABNORMAL HIGH (ref 96–106)
Creatinine, Ser: 3.97 mg/dL — ABNORMAL HIGH (ref 0.76–1.27)
GFR calc Af Amer: 18 mL/min/{1.73_m2} — ABNORMAL LOW (ref >59)
GFR calc non Af Amer: 16 mL/min/{1.73_m2} — ABNORMAL LOW (ref >59)
Glucose: 103 mg/dL — ABNORMAL HIGH (ref 65–99)
Potassium: 5.3 mmol/L — ABNORMAL HIGH (ref 3.5–5.2)
Sodium: 138 mmol/L (ref 134–144)

## 2020-01-11 DIAGNOSIS — N2581 Secondary hyperparathyroidism of renal origin: Secondary | ICD-10-CM | POA: Diagnosis not present

## 2020-01-11 DIAGNOSIS — I129 Hypertensive chronic kidney disease with stage 1 through stage 4 chronic kidney disease, or unspecified chronic kidney disease: Secondary | ICD-10-CM | POA: Diagnosis not present

## 2020-01-11 DIAGNOSIS — N184 Chronic kidney disease, stage 4 (severe): Secondary | ICD-10-CM | POA: Diagnosis not present

## 2020-01-11 DIAGNOSIS — D631 Anemia in chronic kidney disease: Secondary | ICD-10-CM | POA: Diagnosis not present

## 2020-03-06 ENCOUNTER — Other Ambulatory Visit: Payer: Self-pay | Admitting: Family Medicine

## 2020-03-24 DIAGNOSIS — N184 Chronic kidney disease, stage 4 (severe): Secondary | ICD-10-CM | POA: Diagnosis not present

## 2020-03-31 ENCOUNTER — Encounter: Payer: Self-pay | Admitting: Gastroenterology

## 2020-04-03 DIAGNOSIS — N184 Chronic kidney disease, stage 4 (severe): Secondary | ICD-10-CM | POA: Diagnosis not present

## 2020-04-03 DIAGNOSIS — I129 Hypertensive chronic kidney disease with stage 1 through stage 4 chronic kidney disease, or unspecified chronic kidney disease: Secondary | ICD-10-CM | POA: Diagnosis not present

## 2020-04-03 DIAGNOSIS — N2581 Secondary hyperparathyroidism of renal origin: Secondary | ICD-10-CM | POA: Diagnosis not present

## 2020-04-03 DIAGNOSIS — D631 Anemia in chronic kidney disease: Secondary | ICD-10-CM | POA: Diagnosis not present

## 2020-04-15 ENCOUNTER — Other Ambulatory Visit (INDEPENDENT_AMBULATORY_CARE_PROVIDER_SITE_OTHER): Payer: Medicaid Other

## 2020-04-15 ENCOUNTER — Ambulatory Visit (INDEPENDENT_AMBULATORY_CARE_PROVIDER_SITE_OTHER): Payer: Medicaid Other | Admitting: Gastroenterology

## 2020-04-15 ENCOUNTER — Other Ambulatory Visit: Payer: Self-pay

## 2020-04-15 ENCOUNTER — Encounter: Payer: Self-pay | Admitting: Gastroenterology

## 2020-04-15 VITALS — BP 130/84 | HR 68 | Ht 68.75 in | Wt 331.6 lb

## 2020-04-15 DIAGNOSIS — D649 Anemia, unspecified: Secondary | ICD-10-CM

## 2020-04-15 LAB — CBC
HCT: 32.7 % — ABNORMAL LOW (ref 39.0–52.0)
Hemoglobin: 10.7 g/dL — ABNORMAL LOW (ref 13.0–17.0)
MCHC: 32.6 g/dL (ref 30.0–36.0)
MCV: 81.6 fl (ref 78.0–100.0)
Platelets: 243 10*3/uL (ref 150.0–400.0)
RBC: 4.01 Mil/uL — ABNORMAL LOW (ref 4.22–5.81)
RDW: 20.3 % — ABNORMAL HIGH (ref 11.5–15.5)
WBC: 6.3 10*3/uL (ref 4.0–10.5)

## 2020-04-15 LAB — IBC + FERRITIN
Ferritin: 33 ng/mL (ref 22.0–322.0)
Iron: 39 ug/dL — ABNORMAL LOW (ref 42–165)
Saturation Ratios: 12.5 % — ABNORMAL LOW (ref 20.0–50.0)
Transferrin: 223 mg/dL (ref 212.0–360.0)

## 2020-04-15 LAB — VITAMIN B12: Vitamin B-12: 516 pg/mL (ref 211–911)

## 2020-04-15 LAB — FOLATE: Folate: 6.1 ng/mL (ref >5.9)

## 2020-04-15 MED ORDER — NA SULFATE-K SULFATE-MG SULF 17.5-3.13-1.6 GM/177ML PO SOLN: 1.0000 | Freq: Once | ORAL | 0 refills | Status: AC

## 2020-04-15 NOTE — Progress Notes (Signed)
04/15/2020 Johnny [name] [id number] [birth year]   HISTORY OF PRESENT ILLNESS: This is a 60 year old male who is a patient of Dr. Steve Rattler, known to him from hospitalization in April 2021 at which time he was found to have presumed diverticular bleed.  He underwent colonoscopy at that time, but due to blood throughout the colon, preparation was only fair it was recommended he have a repeat colonoscopy in 3 to 6 months because there was a polyp present that was not removed during his acute bleeding course.  He had a colonoscopy in December 2013 by Dr. Deatra Ina at which time he was found to have only diverticulosis.  He was referred here by Dr. McDiarmid on this occasion for evaluation of anemia.  He tells me that he does not see blood in his stools and he does not have any black stools.  It appears that his hemoglobin has ranged between 9.5 g to 10.5 g at least for the past year, which is as far back as I reviewed.  MCV is normal.  He is in late stages of chronic kidney disease and anticipates dialysis within the next year to 2.  He denies any GI complaints including nausea, vomiting, abdominal pain, heartburn/reflux.   Past Medical History:  Diagnosis Date  . Acquired lymphedema 12/13/2017  . Anemia   . Chronic kidney disease    Chronic Kidney disease-stage unknown  . Diverticulosis of large intestine without hemorrhage 01/31/2012  . Essential HTN 04/21/2006  . Excess skin of thigh    left thigh adipose excess  . Hypertension   . OSA 05/21/2013   does not wear CPAP any longer  . 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 Wears dentures    Past Surgical History:  Procedure Laterality Date  . ABDOMINOPLASTY/PANNICULECTOMY WITH LIPOSUCTION Left 10/11/2018   Procedure: Excision of left thigh lipodystrophy;  Surgeon: Wallace Going, DO;  Location: [facility];  Service: Plastics;  Laterality: Left;  . COLONOSCOPY  01/31/2012   Procedure: COLONOSCOPY;  Surgeon: Inda Castle, MD;  Location: [facility];   Service: Endoscopy;  Laterality: N/A;  bmi is 53  . COLONOSCOPY WITH PROPOFOL N/A 06/13/2019   Procedure: COLONOSCOPY WITH PROPOFOL;  Surgeon: Jackquline Denmark, MD;  Location[facility];  Service: Endoscopy;  Laterality: N/A;  . MULTIPLE TOOTH EXTRACTIONS    . PANNICULECTOMY Bilateral 04/20/2018   Procedure: Excision of right inner thighs;  Surgeon: Wallace Going, DO;  Location: [facility];  Service: Plastics;  Laterality: Bilateral;  Excision of right inner thighs    reports that he has never smoked. He has never used smokeless tobacco. He reports current alcohol use. He reports that he does not use drugs. family history includes Breast cancer in his mother; Colon cancer in his father; Hypertension in his brother and mother. Allergies  Allergen Reactions  . Lisinopril Swelling    Angioedema      Outpatient Encounter Medications as of 04/15/2020  Medication Sig  . ascorbic acid (VITAMIN C) 500 MG tablet Take 1 tablet (500 mg total) by mouth daily.  . carvedilol (COREG) 25 MG tablet TAKE 1 TABLET BY MOUTH TWICE DAILY WITH A MEAL  . cholecalciferol (VITAMIN D3) 25 MCG (1000 UNIT) tablet Take 1,000 Units by mouth daily.  . furosemide (LASIX) 40 MG tablet Take 1 tablet (40 mg total) by mouth daily.  Marland Kitchen RAYALDEE 30 MCG CPCR Take 30 mcg by mouth daily.  Marland Kitchen zinc sulfate 220 (50 Zn) MG capsule Take 1 capsule (220 mg total)  by mouth daily.  Marland Kitchen albuterol (VENTOLIN HFA) 108 (90 Base) MCG/ACT inhaler Inhale 1-2 puffs into the lungs every 4 (four) hours as needed for wheezing or shortness of breath. (Patient not taking: Reported on 04/15/2020)   No facility-administered encounter medications on file as of 04/15/2020.     REVIEW OF SYSTEMS  : All other systems reviewed and negative except where noted in the History of Present Illness.   PHYSICAL EXAM: BP 130/84   Pulse 68   Ht 5\' 9"  (1.753 m)   Wt (!) 331 lb 9.6 oz (150.4 kg)   BMI 48.97 kg/m  General: Well developed AA male in no acute  distress Head: Normocephalic and atraumatic Eyes:  Sclerae anicteric, conjunctiva pink. Ears: Normal auditory acuity Lungs: Clear throughout to auscultation; no W/R/R. Heart: Regular rate and rhythm; no M/R/G. Abdomen: Soft, obese, non-distended.  BS present.  Non-tender. Rectal:  Will be done at the time of colonoscopy. Musculoskeletal: Symmetrical with no gross deformities  Skin: No lesions on visible extremities Extremities: No edema  Neurological: Alert oriented x 4, grossly non-focal Psychological:  Alert and cooperative. Normal mood and affect  ASSESSMENT AND PLAN: *Normocytic anemia: His hemoglobin has been low, ranging from about 9.5 g to 10.5 g for least the past year (that is as far back as I reviewed).  He is in stage V chronic kidney disease and anticipating dialysis over the next year to year and a half possibly.  His anemia is certainly likely due to his renal disease.  MCV is normal.  We will check iron studies, vitamin B12 levels, and folate levels.  He needs colonoscopy for screening purposes so we will perform EGD as well just to rule out any GI causes of anemia.  No GI complaints.  Will schedule with Dr. Lyndel Safe.  The risks, benefits, and alternatives to EGD and colonoscopy were discussed with the patient and he consents to proceed.    CC:  [place], [employer], DO

## 2020-04-15 NOTE — Patient Instructions (Addendum)
If you are age 59 or older, your body mass index should be between 23-30. Your Body mass index is 49.33 kg/m. If this is out of the aforementioned range listed, please consider follow up with your Primary Care Provider.  If you are age 17 or younger, your body mass index should be between 19-25. Your Body mass index is 49.33 kg/m. If this is out of the aformentioned range listed, please consider follow up with your Primary Care Provider.   Your provider has requested that you go to the basement level for lab work before leaving today. Press "B" on the elevator. The lab is located at the first door on the left as you exit the elevator.  You have been scheduled for an endoscopy and colonoscopy. Please follow the written instructions given to you at your visit today. Please pick up your prep supplies at the pharmacy within the next 1-3 days. If you use inhalers (even only as needed), please bring them with you on the day of your procedure.  Due to recent changes in healthcare laws, you may see the results of your imaging and laboratory studies on MyChart before your provider has had a chance to review them.  We understand that in some cases there may be results that are confusing or concerning to you. Not all laboratory results come back in the same time frame and the provider may be waiting for multiple results in order to interpret others.  Please give [place] 48 hours in order for your provider to thoroughly review all the results before contacting the office for clarification of your results.

## 2020-04-16 NOTE — Progress Notes (Signed)
Agree with excellent note and plan of action RG

## 2020-06-05 ENCOUNTER — Ambulatory Visit (AMBULATORY_SURGERY_CENTER): Payer: Medicaid Other | Admitting: Gastroenterology

## 2020-06-05 ENCOUNTER — Other Ambulatory Visit: Payer: Self-pay

## 2020-06-05 ENCOUNTER — Other Ambulatory Visit: Payer: Self-pay | Admitting: Gastroenterology

## 2020-06-05 ENCOUNTER — Encounter: Payer: Self-pay | Admitting: Gastroenterology

## 2020-06-05 VITALS — BP 148/95 | HR 64 | Temp 98.0°F | Resp 24 | Ht 68.75 in | Wt 331.0 lb

## 2020-06-05 DIAGNOSIS — D132 Benign neoplasm of duodenum: Secondary | ICD-10-CM | POA: Diagnosis not present

## 2020-06-05 DIAGNOSIS — D509 Iron deficiency anemia, unspecified: Secondary | ICD-10-CM

## 2020-06-05 DIAGNOSIS — K317 Polyp of stomach and duodenum: Secondary | ICD-10-CM

## 2020-06-05 DIAGNOSIS — K2970 Gastritis, unspecified, without bleeding: Secondary | ICD-10-CM | POA: Diagnosis not present

## 2020-06-05 DIAGNOSIS — K2980 Duodenitis without bleeding: Secondary | ICD-10-CM

## 2020-06-05 DIAGNOSIS — K319 Disease of stomach and duodenum, unspecified: Secondary | ICD-10-CM | POA: Diagnosis not present

## 2020-06-05 DIAGNOSIS — D649 Anemia, unspecified: Secondary | ICD-10-CM | POA: Diagnosis not present

## 2020-06-05 HISTORY — PX: UPPER GASTROINTESTINAL ENDOSCOPY: SHX188

## 2020-06-05 MED ORDER — SODIUM CHLORIDE 0.9 % IV SOLN: 500.0000 mL | Freq: Once | INTRAVENOUS | Status: DC

## 2020-06-05 NOTE — Patient Instructions (Signed)
Previsit appointment April 18, come to 3rd floor at 10:15   [place]:   Refer to the procedure report that was given to you for any specific questions about what was found during the examination.  If the procedure report does not answer your questions, please call your gastroenterologist to clarify.  If you requested that your care partner not be given the details of your procedure findings, then the procedure report has been included in a sealed envelope for you to review at your convenience later.  YOU SHOULD EXPECT: Some feelings of bloating in the abdomen. Passage of more gas than usual.  Walking can help get rid of the air that was put into your GI tract during the procedure and reduce the bloating. If you had a lower endoscopy (such as a colonoscopy or flexible sigmoidoscopy) you may notice spotting of blood in your stool or on the toilet paper. If you underwent a bowel prep for your procedure, you may not have a normal bowel movement for a few days.  Please Note:  You might notice some irritation and congestion in your nose or some drainage.  This is from the oxygen used during your procedure.  There is no need for concern and it should clear up in a day or so.  SYMPTOMS TO REPORT IMMEDIATELY:    Following upper endoscopy (EGD)  Vomiting of blood or coffee ground material  New chest pain or pain under the shoulder blades  Painful or persistently difficult swallowing  New shortness of breath  Fever of 100F or higher  Black, tarry-looking stools  For urgent or emergent issues, a gastroenterologist can be reached at any hour by calling [phone]. Do not use MyChart messaging for urgent concerns.    DIET:  We do recommend a small meal at first, but then you may proceed to your regular diet.  Drink plenty of fluids but you should avoid alcoholic beverages for 24 hours.  ACTIVITY:  You should plan to take it easy for the  rest of today and you should NOT DRIVE or use heavy machinery until tomorrow (because of the sedation medicines used during the test).    FOLLOW UP: Our staff will call the number listed on your records 48-72 hours following your procedure to check on you and address any questions or concerns that you may have regarding the information given to you following your procedure. If we do not reach you, we will leave a message.  We will attempt to reach you two times.  During this call, we will ask if you have developed any symptoms of COVID 19. If you develop any symptoms (ie: fever, flu-like symptoms, shortness of breath, cough etc.) before then, please call [phone].  If you test positive for Covid 19 in the 2 weeks post procedure, please call and report this information to [place].    If any biopsies were taken you will be contacted by phone or by letter within the next 1-3 weeks.  Please call us at [phone] if you have not heard about the biopsies in 3 weeks.    SIGNATURES/CONFIDENTIALITY: You and/or your care partner have signed paperwork which will be entered into your electronic medical record.  These signatures attest to the fact that that the information above on your After Visit Summary has been reviewed and is understood.  Full responsibility of the confidentiality of this discharge information lies with you and/or your care-partner.

## 2020-06-05 NOTE — Progress Notes (Signed)
Called to room to assist during endoscopic procedure.  Patient ID and intended procedure confirmed with present staff. Received instructions for my participation in the procedure from the performing physician.  

## 2020-06-05 NOTE — Progress Notes (Signed)
Pt. Had eaten solid food 06/04/20 @1630  and taken all his prep @ 1800 06/04/20,reported that stools, are still solid informed Dr. Lyndel Safe and he stated that "we will do the upper and reschedule him for the colon. Colon rescheduled for 06/23/20 @1600  and pre-visit scheduled 06/09/20 @ 10:30 am,instructed pt. To check in on the 3rd floor for pre-visit and they will instruct him from there. Pt. Verbalize understanding.

## 2020-06-05 NOTE — Progress Notes (Signed)
To pacu, VSS. Report to [email or web address] 

## 2020-06-05 NOTE — Op Note (Signed)
[facility] [name] Procedure Date: 06/05/2020 10:37 AM MRN: [id number] Endoscopist: Jackquline Denmark , MD Age: 60 Referring MD:  Date of Birth: 01/02/1961 Gender: Male Account #: [id number] Procedure:                Upper GI endoscopy Indications:              Unexplained iron deficiency anemia Medicines:                Monitored Anesthesia Care Procedure:                Pre-Anesthesia Assessment:                           - Prior to the procedure, a History and Physical                            was performed, and patient medications and                            allergies were reviewed. The patient's tolerance of                            previous anesthesia was also reviewed. The risks                            and benefits of the procedure and the sedation                            options and risks were discussed with the patient.                            All questions were answered, and informed consent                            was obtained. Prior Anticoagulants: The patient has                            taken no previous anticoagulant or antiplatelet                            agents. ASA Grade Assessment: III - A patient with                            severe systemic disease. After reviewing the risks                            and benefits, the patient was deemed in                            satisfactory condition to undergo the procedure.                           After obtaining informed consent, the endoscope was  passed under direct vision. Throughout the                            procedure, the patient's blood pressure, pulse, and                            oxygen saturations were monitored continuously. The                            Endoscope was introduced through the mouth, and                            advanced to the second part of duodenum. The upper                            GI endoscopy was  accomplished without difficulty.                            The patient tolerated the procedure well. Scope In: Scope Out: Findings:                 The examined esophagus was normal with well-defined                            Z-line at 40 cm.                           A small transient hiatal hernia was present.                           The entire examined stomach was normal. Few                            biopsies were taken with a cold forceps for                            histology.                           A single 6 mm sessile polyp with no bleeding was                            found in D1/D2 junction. Biopsies were taken with a                            cold forceps for histology. Second portion of the                            duodenum was normal. Complications:            No immediate complications. Estimated Blood Loss:     Estimated blood loss: none. Impression:               - Small transient hiatal hernia                           -  A single duodenal polyp. Biopsied.                           - No UGI etiology for anemia. Recommendation:           - Patient has a contact number available for                            emergencies. The signs and symptoms of potential                            delayed complications were discussed with the                            patient. Return to normal activities tomorrow.                            Written discharge instructions were provided to the                            patient.                           - Resume previous diet.                           - Continue present medications.                           - Await pathology results.                           - The findings and recommendations were discussed                            with the patient's family.                           - He was scheduled for colonoscopy as well today.                            Unfortunately, he still had solid stool. Hence, it                             will be rescheduled with a 2-day prep. Jackquline Denmark, MD 06/05/2020 10:59:01 AM This report has been signed electronically.

## 2020-06-09 ENCOUNTER — Other Ambulatory Visit: Payer: Self-pay

## 2020-06-09 ENCOUNTER — Ambulatory Visit (AMBULATORY_SURGERY_CENTER): Payer: Self-pay

## 2020-06-09 VITALS — Ht 68.75 in | Wt 335.0 lb

## 2020-06-09 DIAGNOSIS — D649 Anemia, unspecified: Secondary | ICD-10-CM

## 2020-06-09 MED ORDER — NA SULFATE-K SULFATE-MG SULF 17.5-3.13-1.6 GM/177ML PO SOLN: 1.0000 | Freq: Once | ORAL | 0 refills | Status: AC

## 2020-06-09 NOTE — Progress Notes (Signed)
No egg or soy allergy known to patient  No issues with past sedation with any surgeries or procedures Patient denies ever being told they had issues or difficulty with intubation  No FH of Malignant Hyperthermia No diet pills per patient No home 02 use per patient  No blood thinners per patient  Pt denies issues with constipation  No A fib or A flutter  EMMI video to pt or via [place] 19 guidelines implemented in [place] today with Pt and RN  Pt is fully vaccinated  for Covid   Resched colon from 4/14 since pt did not understand instructions, pt states he fully understands instructions and return verbalizes instructions.    NO PA's for preps discussed with pt In [place] today  Discussed with pt there will be an out-of-pocket cost for prep and that varies from $0 to 70 dollars   Due to the COVID-19 pandemic we are asking patients to follow certain guidelines.  Pt aware of COVID protocols and [facility] guidelines

## 2020-06-10 ENCOUNTER — Telehealth: Payer: Self-pay | Admitting: *Deleted

## 2020-06-10 NOTE — Telephone Encounter (Signed)
  Follow up Call-  Call back number 06/05/2020  Post procedure Call Back phone  # [phone]  Permission to leave phone message Yes  Some recent data might be hidden     Patient questions:  Do you have a fever, pain , or abdominal swelling? No. Pain Score  0 *  Have you tolerated food without any problems? Yes.    Have you been able to return to your normal activities? Yes.    Do you have any questions about your discharge instructions: Diet   No. Medications  No. Follow up visit  No.  Do you have questions or concerns about your Care? No.  Actions: * If pain score is 4 or above: 1. No action needed, pain <4.Have you developed a fever since your procedure? no  2.   Have you had an respiratory symptoms (SOB or cough) since your procedure? no  3.   Have you tested positive for COVID 19 since your procedure no  4.   Have you had any family members/close contacts diagnosed with the COVID 19 since your procedure?  no   If yes to any of these questions please route to Joylene John, RN and Joella Prince, RN

## 2020-06-17 ENCOUNTER — Encounter: Payer: Self-pay | Admitting: Family Medicine

## 2020-06-17 DIAGNOSIS — D500 Iron deficiency anemia secondary to blood loss (chronic): Secondary | ICD-10-CM | POA: Insufficient documentation

## 2020-06-19 ENCOUNTER — Encounter: Payer: Self-pay | Admitting: Gastroenterology

## 2020-06-23 ENCOUNTER — Ambulatory Visit (AMBULATORY_SURGERY_CENTER): Payer: Medicaid Other | Admitting: Gastroenterology

## 2020-06-23 ENCOUNTER — Other Ambulatory Visit: Payer: Self-pay | Admitting: Gastroenterology

## 2020-06-23 ENCOUNTER — Encounter: Payer: Self-pay | Admitting: Gastroenterology

## 2020-06-23 ENCOUNTER — Other Ambulatory Visit: Payer: Self-pay

## 2020-06-23 VITALS — BP 130/66 | HR 65 | Temp 97.1°F | Resp 22 | Ht 68.75 in | Wt 335.0 lb

## 2020-06-23 DIAGNOSIS — K648 Other hemorrhoids: Secondary | ICD-10-CM

## 2020-06-23 DIAGNOSIS — Z1211 Encounter for screening for malignant neoplasm of colon: Secondary | ICD-10-CM | POA: Diagnosis not present

## 2020-06-23 DIAGNOSIS — D122 Benign neoplasm of ascending colon: Secondary | ICD-10-CM

## 2020-06-23 DIAGNOSIS — K5730 Diverticulosis of large intestine without perforation or abscess without bleeding: Secondary | ICD-10-CM

## 2020-06-23 DIAGNOSIS — D124 Benign neoplasm of descending colon: Secondary | ICD-10-CM

## 2020-06-23 DIAGNOSIS — D649 Anemia, unspecified: Secondary | ICD-10-CM

## 2020-06-23 DIAGNOSIS — D123 Benign neoplasm of transverse colon: Secondary | ICD-10-CM | POA: Diagnosis not present

## 2020-06-23 MED ORDER — SODIUM CHLORIDE 0.9 % IV SOLN: 500.0000 mL | Freq: Once | INTRAVENOUS | Status: DC

## 2020-06-23 NOTE — Op Note (Signed)
[place] Patient Name: Ladale Sherburn Procedure Date: 06/23/2020 3:36 PM MRN: [id number] Endoscopist: Jackquline Denmark , MD Age: 60 Referring MD:  Date of Birth: 1960/07/01 Gender: Male Account #: [id number] Procedure:                Colonoscopy Indications:              H/O LGI bleed d/t diverticular bleed (resolved). Medicines:                Monitored Anesthesia Care Procedure:                Pre-Anesthesia Assessment:                           - Prior to the procedure, a History and Physical                            was performed, and patient medications and                            allergies were reviewed. The patient's tolerance of                            previous anesthesia was also reviewed. The risks                            and benefits of the procedure and the sedation                            options and risks were discussed with the patient.                            All questions were answered, and informed consent                            was obtained. Prior Anticoagulants: The patient has                            taken no previous anticoagulant or antiplatelet                            agents. ASA Grade Assessment: III - A patient with                            severe systemic disease. After reviewing the risks                            and benefits, the patient was deemed in                            satisfactory condition to undergo the procedure.                           After obtaining informed consent, the colonoscope  was passed under direct vision. Throughout the                            procedure, the patient's blood pressure, pulse, and                            oxygen saturations were monitored continuously. The                            Olympus CF-HQ190 [id number]) Colonoscope was                            introduced through the anus and advanced to the 2                            cm into the ileum.  The colonoscopy was performed                            without difficulty. The patient tolerated the                            procedure well. The quality of the bowel                            preparation was good. The ileocecal valve,                            appendiceal orifice, and rectum were photographed. Scope In: 3:42:38 PM Scope Out: 4:01:33 PM Scope Withdrawal Time: 0 hours 10 minutes 26 seconds  Total Procedure Duration: 0 hours 18 minutes 55 seconds  Findings:                 Five sessile polyps were found in the mid                            descending colon (1), proximal transverse colon (2)                            and ascending colon (3). The polyps were 4 to 8 mm                            in size. These polyps were removed with a cold                            snare. Resection and retrieval were complete.                           Multiple medium-mouthed diverticula were found in                            the sigmoid colon, few in descending colon,                            transverse colon  and ascending colon.                           Non-bleeding internal hemorrhoids were found during                            retroflexion. The hemorrhoids were small.                           The terminal ileum appeared normal.                           The exam was otherwise without abnormality on                            direct and retroflexion views. Complications:            No immediate complications. Estimated Blood Loss:     Estimated blood loss: none. Impression:               - Five 4 to 8 mm polyps in the mid descending                            colon, in the proximal transverse colon and in the                            ascending colon, removed with a cold snare.                            Resected and retrieved.                           - Pancolonic diverticulosis predominantly in the                            sigmoid colon.                            - Non-bleeding internal hemorrhoids.                           - The examined portion of the ileum was normal.                           - The examination was otherwise normal on direct                            and retroflexion views. Recommendation:           - Patient has a contact number available for                            emergencies. The signs and symptoms of potential                            delayed complications were discussed with the  patient. Return to normal activities tomorrow.                            Written discharge instructions were provided to the                            patient.                           - High fiber diet.                           - Continue present medications.                           - Await pathology results.                           - Repeat colonoscopy for surveillance based on                            pathology results.                           - The findings and recommendations were discussed                            with the patient's family. Jackquline Denmark, MD 06/23/2020 4:07:09 PM This report has been signed electronically.

## 2020-06-23 NOTE — Progress Notes (Signed)
Called to room to assist during endoscopic procedure.  Patient ID and intended procedure confirmed with present staff. Received instructions for my participation in the procedure from the performing physician.  

## 2020-06-23 NOTE — Progress Notes (Signed)
VS by CW  Pt's states no medical or surgical changes since previsit or office visit.  

## 2020-06-23 NOTE — Progress Notes (Signed)
pt tolerated well. VSS. awake and to recovery. Report given to RN.  

## 2020-06-23 NOTE — Patient Instructions (Signed)
Handouts given for polyps, hemorrhoids, high fiber diet and diverticulosis.  YOU HAD AN ENDOSCOPIC PROCEDURE TODAY AT [facility]:   Refer to the procedure report that was given to you for any specific questions about what was found during the examination.  If the procedure report does not answer your questions, please call your gastroenterologist to clarify.  If you requested that your care partner not be given the details of your procedure findings, then the procedure report has been included in a sealed envelope for you to review at your convenience later.  YOU SHOULD EXPECT: Some feelings of bloating in the abdomen. Passage of more gas than usual.  Walking can help get rid of the air that was put into your GI tract during the procedure and reduce the bloating. If you had a lower endoscopy (such as a colonoscopy or flexible sigmoidoscopy) you may notice spotting of blood in your stool or on the toilet paper. If you underwent a bowel prep for your procedure, you may not have a normal bowel movement for a few days.  Please Note:  You might notice some irritation and congestion in your nose or some drainage.  This is from the oxygen used during your procedure.  There is no need for concern and it should clear up in a day or so.  SYMPTOMS TO REPORT IMMEDIATELY:   Following lower endoscopy (colonoscopy):  Excessive amounts of blood in the stool  Significant tenderness or worsening of abdominal pains  Swelling of the abdomen that is new, acute  Fever of 100F or higher  For urgent or emergent issues, a gastroenterologist can be reached at any hour by calling [phone]. Do not use MyChart messaging for urgent concerns.    DIET:  We do recommend a small meal at first, but then you may proceed to your high fiber diet.  Drink plenty of fluids but you should avoid alcoholic beverages for 24 hours.  ACTIVITY:  You should plan to take it easy for the rest of today and you should  NOT DRIVE or use heavy machinery until tomorrow (because of the sedation medicines used during the test).    FOLLOW UP: Our staff will call the number listed on your records 48-72 hours following your procedure to check on you and address any questions or concerns that you may have regarding the information given to you following your procedure. If we do not reach you, we will leave a message.  We will attempt to reach you two times.  During this call, we will ask if you have developed any symptoms of COVID 19. If you develop any symptoms (ie: fever, flu-like symptoms, shortness of breath, cough etc.) before then, please call [phone].  If you test positive for Covid 19 in the 2 weeks post procedure, please call and report this information to [place].    If any biopsies were taken you will be contacted by phone or by letter within the next 1-3 weeks.  Please call us at [phone] if you have not heard about the biopsies in 3 weeks.    SIGNATURES/CONFIDENTIALITY: You and/or your care partner have signed paperwork which will be entered into your electronic medical record.  These signatures attest to the fact that that the information above on your After Visit Summary has been reviewed and is understood.  Full responsibility of the confidentiality of this discharge information lies with you and/or your care-partner.

## 2020-06-25 ENCOUNTER — Telehealth: Payer: Self-pay

## 2020-06-25 NOTE — Telephone Encounter (Signed)
  Follow up Call-  Call back number 06/23/2020 06/05/2020  Post procedure Call Back phone  # [phone]  Permission to leave phone message Yes Yes  Some recent data might be hidden     Patient questions:  Do you have a fever, pain , or abdominal swelling? No. Pain Score  0 *  Have you tolerated food without any problems? Yes.    Have you been able to return to your normal activities? Yes.    Do you have any questions about your discharge instructions: Diet   No. Medications  No. Follow up visit  No.  Do you have questions or concerns about your Care? No.  Actions: * If pain score is 4 or above: No action needed, pain <4.

## 2020-07-01 DIAGNOSIS — N189 Chronic kidney disease, unspecified: Secondary | ICD-10-CM | POA: Diagnosis not present

## 2020-07-01 DIAGNOSIS — D631 Anemia in chronic kidney disease: Secondary | ICD-10-CM | POA: Diagnosis not present

## 2020-07-01 DIAGNOSIS — I129 Hypertensive chronic kidney disease with stage 1 through stage 4 chronic kidney disease, or unspecified chronic kidney disease: Secondary | ICD-10-CM | POA: Diagnosis not present

## 2020-07-01 DIAGNOSIS — N2581 Secondary hyperparathyroidism of renal origin: Secondary | ICD-10-CM | POA: Diagnosis not present

## 2020-07-01 DIAGNOSIS — N184 Chronic kidney disease, stage 4 (severe): Secondary | ICD-10-CM | POA: Diagnosis not present

## 2020-07-10 ENCOUNTER — Encounter: Payer: Self-pay | Admitting: Gastroenterology

## 2020-07-22 ENCOUNTER — Encounter: Payer: Self-pay | Admitting: Family Medicine

## 2020-07-22 DIAGNOSIS — D369 Benign neoplasm, unspecified site: Secondary | ICD-10-CM | POA: Insufficient documentation

## 2020-09-22 DIAGNOSIS — N184 Chronic kidney disease, stage 4 (severe): Secondary | ICD-10-CM | POA: Diagnosis not present

## 2020-09-29 DIAGNOSIS — N185 Chronic kidney disease, stage 5: Secondary | ICD-10-CM | POA: Diagnosis not present

## 2020-09-29 DIAGNOSIS — N2581 Secondary hyperparathyroidism of renal origin: Secondary | ICD-10-CM | POA: Diagnosis not present

## 2020-09-29 DIAGNOSIS — I120 Hypertensive chronic kidney disease with stage 5 chronic kidney disease or end stage renal disease: Secondary | ICD-10-CM | POA: Diagnosis not present

## 2020-09-29 DIAGNOSIS — D631 Anemia in chronic kidney disease: Secondary | ICD-10-CM | POA: Diagnosis not present

## 2020-09-29 DIAGNOSIS — N184 Chronic kidney disease, stage 4 (severe): Secondary | ICD-10-CM | POA: Diagnosis not present

## 2020-10-21 ENCOUNTER — Other Ambulatory Visit: Payer: Self-pay | Admitting: Family Medicine

## 2020-12-22 DIAGNOSIS — N185 Chronic kidney disease, stage 5: Secondary | ICD-10-CM | POA: Diagnosis not present

## 2021-01-02 DIAGNOSIS — D631 Anemia in chronic kidney disease: Secondary | ICD-10-CM | POA: Diagnosis not present

## 2021-01-02 DIAGNOSIS — I120 Hypertensive chronic kidney disease with stage 5 chronic kidney disease or end stage renal disease: Secondary | ICD-10-CM | POA: Diagnosis not present

## 2021-01-02 DIAGNOSIS — N2581 Secondary hyperparathyroidism of renal origin: Secondary | ICD-10-CM | POA: Diagnosis not present

## 2021-01-02 DIAGNOSIS — N185 Chronic kidney disease, stage 5: Secondary | ICD-10-CM | POA: Diagnosis not present

## 2021-02-04 IMAGING — DX DG CHEST 1V PORT
1 series · 1 of 1 positions shown · non-contrast
Comparison: Two-view chest x-ray 03/08/2019

CLINICAL DATA: Shortness of breath. Patient recently tested
positive for [id number].

EXAM:
PORTABLE CHEST 1 VIEW

[chest]
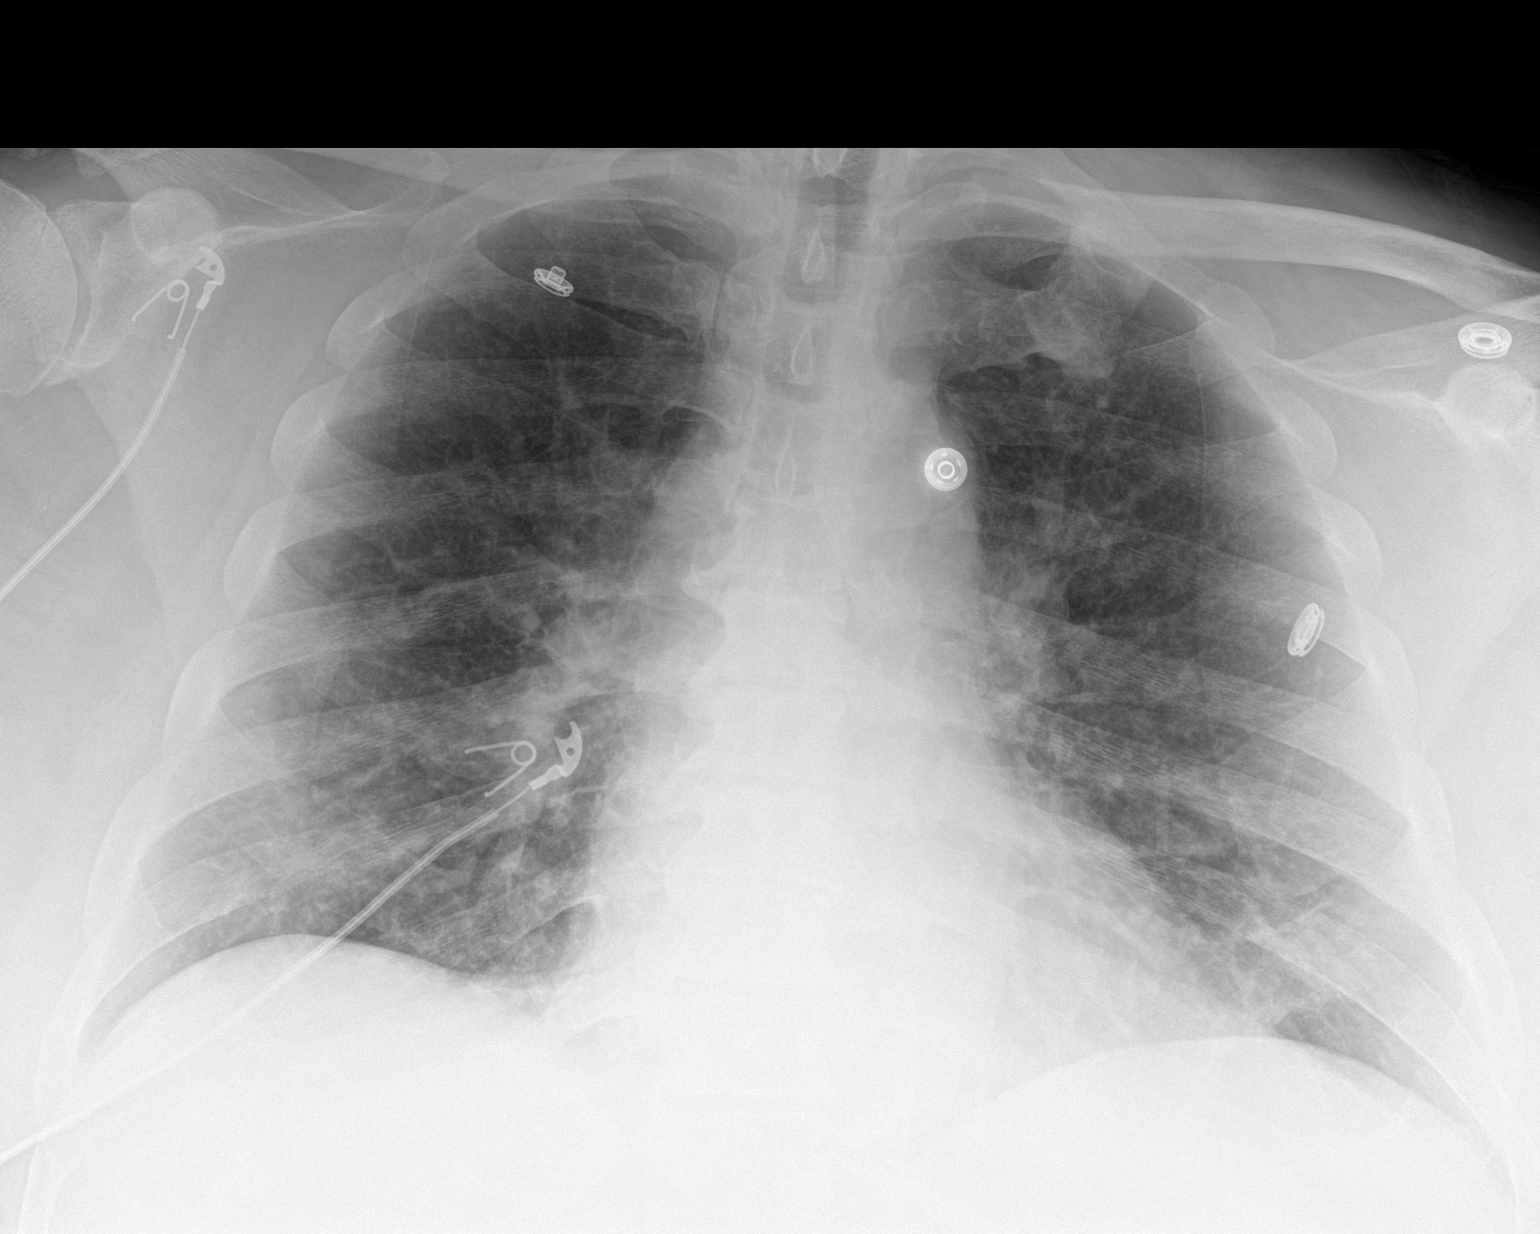

[1 of 1 positions shown; findings below may reference images not displayed]

FINDINGS: Heart size is normal. Lung volumes are low. Patchy bibasilar
airspace opacities have increased slightly.
IMPRESSION: Slight increase in patchy bibasilar airspace disease. This is
concerning for pneumonia.

## 2021-03-30 DIAGNOSIS — N185 Chronic kidney disease, stage 5: Secondary | ICD-10-CM | POA: Diagnosis not present

## 2021-04-06 DIAGNOSIS — N185 Chronic kidney disease, stage 5: Secondary | ICD-10-CM | POA: Diagnosis not present

## 2021-04-06 DIAGNOSIS — D631 Anemia in chronic kidney disease: Secondary | ICD-10-CM | POA: Diagnosis not present

## 2021-04-06 DIAGNOSIS — N2581 Secondary hyperparathyroidism of renal origin: Secondary | ICD-10-CM | POA: Diagnosis not present

## 2021-04-06 DIAGNOSIS — I120 Hypertensive chronic kidney disease with stage 5 chronic kidney disease or end stage renal disease: Secondary | ICD-10-CM | POA: Diagnosis not present

## 2021-04-29 ENCOUNTER — Other Ambulatory Visit: Payer: Self-pay | Admitting: Family Medicine

## 2021-05-18 DIAGNOSIS — N185 Chronic kidney disease, stage 5: Secondary | ICD-10-CM | POA: Diagnosis not present

## 2021-05-26 DIAGNOSIS — N189 Chronic kidney disease, unspecified: Secondary | ICD-10-CM | POA: Diagnosis not present

## 2021-05-26 DIAGNOSIS — I120 Hypertensive chronic kidney disease with stage 5 chronic kidney disease or end stage renal disease: Secondary | ICD-10-CM | POA: Diagnosis not present

## 2021-05-26 DIAGNOSIS — D631 Anemia in chronic kidney disease: Secondary | ICD-10-CM | POA: Diagnosis not present

## 2021-05-26 DIAGNOSIS — N185 Chronic kidney disease, stage 5: Secondary | ICD-10-CM | POA: Diagnosis not present

## 2021-05-26 DIAGNOSIS — N2581 Secondary hyperparathyroidism of renal origin: Secondary | ICD-10-CM | POA: Diagnosis not present

## 2021-05-29 ENCOUNTER — Emergency Department (HOSPITAL_COMMUNITY): Payer: Medicaid Other

## 2021-05-29 ENCOUNTER — Inpatient Hospital Stay (HOSPITAL_COMMUNITY)
Admission: EM | Admit: 2021-05-29 | Discharge: 2021-06-04 | DRG: 264 | Disposition: A | Discharge status: Home / Self Care | Payer: Medicaid Other | Attending: Internal Medicine | Admitting: Internal Medicine

## 2021-05-29 DIAGNOSIS — Z86711 Personal history of pulmonary embolism: Secondary | ICD-10-CM | POA: Diagnosis not present

## 2021-05-29 DIAGNOSIS — I5022 Chronic systolic (congestive) heart failure: Secondary | ICD-10-CM | POA: Diagnosis not present

## 2021-05-29 DIAGNOSIS — N186 End stage renal disease: Secondary | ICD-10-CM | POA: Diagnosis not present

## 2021-05-29 DIAGNOSIS — R7989 Other specified abnormal findings of blood chemistry: Secondary | ICD-10-CM | POA: Diagnosis not present

## 2021-05-29 DIAGNOSIS — J9602 Acute respiratory failure with hypercapnia: Secondary | ICD-10-CM | POA: Diagnosis not present

## 2021-05-29 DIAGNOSIS — I10 Essential (primary) hypertension: Secondary | ICD-10-CM | POA: Diagnosis not present

## 2021-05-29 DIAGNOSIS — R079 Chest pain, unspecified: Secondary | ICD-10-CM | POA: Diagnosis not present

## 2021-05-29 DIAGNOSIS — J9601 Acute respiratory failure with hypoxia: Secondary | ICD-10-CM | POA: Diagnosis present

## 2021-05-29 DIAGNOSIS — I517 Cardiomegaly: Secondary | ICD-10-CM | POA: Diagnosis not present

## 2021-05-29 DIAGNOSIS — R0600 Dyspnea, unspecified: Secondary | ICD-10-CM | POA: Diagnosis not present

## 2021-05-29 DIAGNOSIS — Z8249 Family history of ischemic heart disease and other diseases of the circulatory system: Secondary | ICD-10-CM | POA: Diagnosis not present

## 2021-05-29 DIAGNOSIS — N5089 Other specified disorders of the male genital organs: Secondary | ICD-10-CM | POA: Diagnosis not present

## 2021-05-29 DIAGNOSIS — Z23 Encounter for immunization: Secondary | ICD-10-CM

## 2021-05-29 DIAGNOSIS — J810 Acute pulmonary edema: Secondary | ICD-10-CM | POA: Insufficient documentation

## 2021-05-29 DIAGNOSIS — I953 Hypotension of hemodialysis: Secondary | ICD-10-CM | POA: Diagnosis not present

## 2021-05-29 DIAGNOSIS — E872 Acidosis, unspecified: Secondary | ICD-10-CM | POA: Diagnosis not present

## 2021-05-29 DIAGNOSIS — N2581 Secondary hyperparathyroidism of renal origin: Secondary | ICD-10-CM | POA: Diagnosis not present

## 2021-05-29 DIAGNOSIS — E662 Morbid (severe) obesity with alveolar hypoventilation: Secondary | ICD-10-CM | POA: Diagnosis present

## 2021-05-29 DIAGNOSIS — N189 Chronic kidney disease, unspecified: Secondary | ICD-10-CM | POA: Diagnosis not present

## 2021-05-29 DIAGNOSIS — Z6841 Body Mass Index (BMI) 40.0 and over, adult: Secondary | ICD-10-CM | POA: Diagnosis not present

## 2021-05-29 DIAGNOSIS — N184 Chronic kidney disease, stage 4 (severe): Secondary | ICD-10-CM | POA: Diagnosis not present

## 2021-05-29 DIAGNOSIS — N179 Acute kidney failure, unspecified: Secondary | ICD-10-CM | POA: Diagnosis present

## 2021-05-29 DIAGNOSIS — D631 Anemia in chronic kidney disease: Secondary | ICD-10-CM | POA: Diagnosis not present

## 2021-05-29 DIAGNOSIS — I509 Heart failure, unspecified: Secondary | ICD-10-CM | POA: Diagnosis not present

## 2021-05-29 DIAGNOSIS — Z91199 Patient's noncompliance with other medical treatment and regimen due to unspecified reason: Secondary | ICD-10-CM

## 2021-05-29 DIAGNOSIS — I161 Hypertensive emergency: Secondary | ICD-10-CM | POA: Diagnosis present

## 2021-05-29 DIAGNOSIS — Z79899 Other long term (current) drug therapy: Secondary | ICD-10-CM

## 2021-05-29 DIAGNOSIS — J9691 Respiratory failure, unspecified with hypoxia: Secondary | ICD-10-CM | POA: Diagnosis not present

## 2021-05-29 DIAGNOSIS — I120 Hypertensive chronic kidney disease with stage 5 chronic kidney disease or end stage renal disease: Secondary | ICD-10-CM | POA: Diagnosis not present

## 2021-05-29 DIAGNOSIS — E8779 Other fluid overload: Secondary | ICD-10-CM | POA: Diagnosis not present

## 2021-05-29 DIAGNOSIS — E881 Lipodystrophy, not elsewhere classified: Secondary | ICD-10-CM | POA: Diagnosis not present

## 2021-05-29 DIAGNOSIS — I129 Hypertensive chronic kidney disease with stage 1 through stage 4 chronic kidney disease, or unspecified chronic kidney disease: Secondary | ICD-10-CM

## 2021-05-29 DIAGNOSIS — R0602 Shortness of breath: Secondary | ICD-10-CM | POA: Diagnosis not present

## 2021-05-29 DIAGNOSIS — I132 Hypertensive heart and chronic kidney disease with heart failure and with stage 5 chronic kidney disease, or end stage renal disease: Principal | ICD-10-CM | POA: Diagnosis present

## 2021-05-29 DIAGNOSIS — R0902 Hypoxemia: Secondary | ICD-10-CM | POA: Diagnosis not present

## 2021-05-29 DIAGNOSIS — R062 Wheezing: Secondary | ICD-10-CM | POA: Diagnosis not present

## 2021-05-29 DIAGNOSIS — Z888 Allergy status to other drugs, medicaments and biological substances status: Secondary | ICD-10-CM | POA: Diagnosis not present

## 2021-05-29 DIAGNOSIS — Z20822 Contact with and (suspected) exposure to covid-19: Secondary | ICD-10-CM | POA: Diagnosis not present

## 2021-05-29 DIAGNOSIS — R0689 Other abnormalities of breathing: Secondary | ICD-10-CM | POA: Diagnosis not present

## 2021-05-29 DIAGNOSIS — Z862 Personal history of diseases of the blood and blood-forming organs and certain disorders involving the immune mechanism: Secondary | ICD-10-CM | POA: Diagnosis not present

## 2021-05-29 DIAGNOSIS — G4730 Sleep apnea, unspecified: Secondary | ICD-10-CM | POA: Diagnosis present

## 2021-05-29 DIAGNOSIS — E8770 Fluid overload, unspecified: Secondary | ICD-10-CM | POA: Diagnosis present

## 2021-05-29 DIAGNOSIS — N185 Chronic kidney disease, stage 5: Secondary | ICD-10-CM | POA: Diagnosis not present

## 2021-05-29 LAB — I-STAT ARTERIAL BLOOD GAS, ED
Acid-base deficit: 4 mmol/L — ABNORMAL HIGH (ref 0.0–2.0)
Bicarbonate: 25 mmol/L (ref 20.0–28.0)
Calcium, Ion: 1.05 mmol/L — ABNORMAL LOW (ref 1.15–1.40)
HCT: 27 % — ABNORMAL LOW (ref 39.0–52.0)
Hemoglobin: 9.2 g/dL — ABNORMAL LOW (ref 13.0–17.0)
O2 Saturation: 99 %
Patient temperature: 98.6
Potassium: 4 mmol/L (ref 3.5–5.1)
Sodium: 140 mmol/L (ref 135–145)
TCO2: 27 mmol/L (ref 22–32)
pCO2 arterial: 72.5 mmHg (ref 32–48)
pH, Arterial: 7.146 — CL (ref 7.35–7.45)
pO2, Arterial: 193 mmHg — ABNORMAL HIGH (ref 83–108)

## 2021-05-29 LAB — CBC WITH DIFFERENTIAL/PLATELET
Abs Immature Granulocytes: 0.04 10*3/uL (ref 0.00–0.07)
Basophils Absolute: 0 10*3/uL (ref 0.0–0.1)
Basophils Relative: 0 %
Eosinophils Absolute: 0.2 10*3/uL (ref 0.0–0.5)
Eosinophils Relative: 2 %
HCT: 30.3 % — ABNORMAL LOW (ref 39.0–52.0)
Hemoglobin: 9.5 g/dL — ABNORMAL LOW (ref 13.0–17.0)
Immature Granulocytes: 0 %
Lymphocytes Relative: 15 %
Lymphs Abs: 1.9 10*3/uL (ref 0.7–4.0)
MCH: 29.8 pg (ref 26.0–34.0)
MCHC: 31.4 g/dL (ref 30.0–36.0)
MCV: 95 fL (ref 80.0–100.0)
Monocytes Absolute: 0.8 10*3/uL (ref 0.1–1.0)
Monocytes Relative: 7 %
Neutro Abs: 9.2 10*3/uL — ABNORMAL HIGH (ref 1.7–7.7)
Neutrophils Relative %: 76 %
Platelets: 274 10*3/uL (ref 150–400)
RBC: 3.19 MIL/uL — ABNORMAL LOW (ref 4.22–5.81)
RDW: 14.1 % (ref 11.5–15.5)
WBC: 12.2 10*3/uL — ABNORMAL HIGH (ref 4.0–10.5)
nRBC: 0 % (ref 0.0–0.2)

## 2021-05-29 LAB — GLUCOSE, CAPILLARY: Glucose-Capillary: 105 mg/dL — ABNORMAL HIGH (ref 70–99)

## 2021-05-29 LAB — I-STAT VENOUS BLOOD GAS, ED
Acid-base deficit: 6 mmol/L — ABNORMAL HIGH (ref 0.0–2.0)
Bicarbonate: 20.6 mmol/L (ref 20.0–28.0)
Calcium, Ion: 0.91 mmol/L — ABNORMAL LOW (ref 1.15–1.40)
HCT: 29 % — ABNORMAL LOW (ref 39.0–52.0)
Hemoglobin: 9.9 g/dL — ABNORMAL LOW (ref 13.0–17.0)
O2 Saturation: 99 %
Potassium: 4.6 mmol/L (ref 3.5–5.1)
Sodium: 139 mmol/L (ref 135–145)
TCO2: 22 mmol/L (ref 22–32)
pCO2, Ven: 43.9 mmHg — ABNORMAL LOW (ref 44–60)
pH, Ven: 7.28 (ref 7.25–7.43)
pO2, Ven: 180 mmHg — ABNORMAL HIGH (ref 32–45)

## 2021-05-29 LAB — COMPREHENSIVE METABOLIC PANEL
ALT: 24 U/L (ref 0–44)
AST: 27 U/L (ref 15–41)
Albumin: 2.8 g/dL — ABNORMAL LOW (ref 3.5–5.0)
Alkaline Phosphatase: 64 U/L (ref 38–126)
Anion gap: 16 — ABNORMAL HIGH (ref 5–15)
BUN: 84 mg/dL — ABNORMAL HIGH (ref 6–20)
CO2: 19 mmol/L — ABNORMAL LOW (ref 22–32)
Calcium: 7.8 mg/dL — ABNORMAL LOW (ref 8.9–10.3)
Chloride: 105 mmol/L (ref 98–111)
Creatinine, Ser: 11.88 mg/dL — ABNORMAL HIGH (ref 0.61–1.24)
GFR, Estimated: 4 mL/min — ABNORMAL LOW (ref >60)
Glucose, Bld: 159 mg/dL — ABNORMAL HIGH (ref 70–99)
Potassium: 4.5 mmol/L (ref 3.5–5.1)
Sodium: 140 mmol/L (ref 135–145)
Total Bilirubin: 0.8 mg/dL (ref 0.3–1.2)
Total Protein: 7.8 g/dL (ref 6.5–8.1)

## 2021-05-29 LAB — RAPID URINE DRUG SCREEN, HOSP PERFORMED
Amphetamines: NOT DETECTED
Barbiturates: NOT DETECTED
Benzodiazepines: NOT DETECTED
Cocaine: NOT DETECTED
Opiates: NOT DETECTED
Tetrahydrocannabinol: NOT DETECTED

## 2021-05-29 LAB — I-STAT CHEM 8, ED
BUN: 92 mg/dL — ABNORMAL HIGH (ref 6–20)
Calcium, Ion: 0.9 mmol/L — ABNORMAL LOW (ref 1.15–1.40)
Chloride: 109 mmol/L (ref 98–111)
Creatinine, Ser: 13.6 mg/dL — ABNORMAL HIGH (ref 0.61–1.24)
Glucose, Bld: 155 mg/dL — ABNORMAL HIGH (ref 70–99)
HCT: 28 % — ABNORMAL LOW (ref 39.0–52.0)
Hemoglobin: 9.5 g/dL — ABNORMAL LOW (ref 13.0–17.0)
Potassium: 4.5 mmol/L (ref 3.5–5.1)
Sodium: 139 mmol/L (ref 135–145)
TCO2: 22 mmol/L (ref 22–32)

## 2021-05-29 LAB — URINALYSIS, COMPLETE (UACMP) WITH MICROSCOPIC
Bilirubin Urine: NEGATIVE
Glucose, UA: NEGATIVE mg/dL
Hgb urine dipstick: NEGATIVE
Ketones, ur: NEGATIVE mg/dL
Leukocytes,Ua: NEGATIVE
Nitrite: NEGATIVE
Protein, ur: 100 mg/dL — AB
Specific Gravity, Urine: 1.015 (ref 1.005–1.030)
pH: 6 (ref 5.0–8.0)

## 2021-05-29 LAB — BRAIN NATRIURETIC PEPTIDE: B Natriuretic Peptide: 928.7 pg/mL — ABNORMAL HIGH (ref 0.0–100.0)

## 2021-05-29 LAB — LACTIC ACID, PLASMA: Lactic Acid, Venous: 1.3 mmol/L (ref 0.5–1.9)

## 2021-05-29 LAB — TROPONIN I (HIGH SENSITIVITY): Troponin I (High Sensitivity): 30 ng/L — ABNORMAL HIGH (ref <18)

## 2021-05-29 MED ORDER — CHLORHEXIDINE GLUCONATE 0.12% ORAL RINSE (MEDLINE KIT): 15.0000 mL | Freq: Two times a day (BID) | OROMUCOSAL | Status: DC

## 2021-05-29 MED ORDER — PANTOPRAZOLE SODIUM 40 MG PO TBEC
40.0000 mg | DELAYED_RELEASE_TABLET | Freq: Every day | ORAL | Status: DC
  Administered 2021-05-30 – 2021-06-01 (×3): 40 mg via ORAL
  Filled 2021-05-29 (×3): qty 1

## 2021-05-29 MED ORDER — POLYETHYLENE GLYCOL 3350 17 G PO PACK
17.0000 g | PACK | Freq: Every day | ORAL | Status: DC | PRN
  Filled 2021-05-29: qty 1

## 2021-05-29 MED ORDER — FUROSEMIDE 10 MG/ML IJ SOLN
80.0000 mg | Freq: Three times a day (TID) | INTRAMUSCULAR | Status: DC
  Administered 2021-05-30 – 2021-05-31 (×4): 80 mg via INTRAVENOUS
  Filled 2021-05-29 (×4): qty 8

## 2021-05-29 MED ORDER — SODIUM CHLORIDE 0.9 % IV SOLN
2.0000 g | INTRAVENOUS | Status: DC
  Administered 2021-05-29: 2 g via INTRAVENOUS
  Filled 2021-05-29: qty 20

## 2021-05-29 MED ORDER — FUROSEMIDE 10 MG/ML IJ SOLN
40.0000 mg | Freq: Once | INTRAMUSCULAR | Status: AC
  Administered 2021-05-29: 40 mg via INTRAVENOUS
  Filled 2021-05-29: qty 4

## 2021-05-29 MED ORDER — NITROGLYCERIN IN D5W 200-5 MCG/ML-% IV SOLN
0.0000 ug/min | INTRAVENOUS | Status: DC
  Administered 2021-05-29: 20 ug/min via INTRAVENOUS
  Filled 2021-05-29: qty 250

## 2021-05-29 MED ORDER — LANTHANUM CARBONATE 500 MG PO CHEW
500.0000 mg | CHEWABLE_TABLET | Freq: Three times a day (TID) | ORAL | Status: DC
Start: 2021-05-30 — End: 2021-06-04
  Administered 2021-05-30 – 2021-06-04 (×12): 500 mg via ORAL
  Filled 2021-05-29 (×17): qty 1

## 2021-05-29 MED ORDER — SODIUM CHLORIDE 0.9 % IV SOLN
500.0000 mg | INTRAVENOUS | Status: DC
  Administered 2021-05-29: 500 mg via INTRAVENOUS
  Filled 2021-05-29: qty 5

## 2021-05-29 MED ORDER — ORAL CARE MOUTH RINSE
15.0000 mL | Freq: Two times a day (BID) | OROMUCOSAL | Status: DC
  Administered 2021-05-30 – 2021-06-03 (×6): 15 mL via OROMUCOSAL

## 2021-05-29 MED ORDER — IPRATROPIUM-ALBUTEROL 0.5-2.5 (3) MG/3ML IN SOLN: 3.0000 mL | RESPIRATORY_TRACT | Status: DC | PRN

## 2021-05-29 MED ORDER — CHLORHEXIDINE GLUCONATE 0.12 % MT SOLN
15.0000 mL | Freq: Two times a day (BID) | OROMUCOSAL | Status: DC
  Administered 2021-05-30 – 2021-06-03 (×9): 15 mL via OROMUCOSAL
  Filled 2021-05-29 (×6): qty 15

## 2021-05-29 MED ORDER — HEPARIN SODIUM (PORCINE) 5000 UNIT/ML IJ SOLN
5000.0000 [IU] | Freq: Three times a day (TID) | INTRAMUSCULAR | Status: DC
  Administered 2021-05-30 – 2021-06-04 (×15): 5000 [IU] via SUBCUTANEOUS
  Filled 2021-05-29 (×15): qty 1

## 2021-05-29 MED ORDER — ORAL CARE MOUTH RINSE: 15.0000 mL | OROMUCOSAL | Status: DC

## 2021-05-29 MED ORDER — CALCITRIOL 0.5 MCG PO CAPS
1.0000 ug | ORAL_CAPSULE | Freq: Every day | ORAL | Status: DC
  Administered 2021-05-30 – 2021-06-04 (×6): 1 ug via ORAL
  Filled 2021-05-29 (×6): qty 2

## 2021-05-29 MED ORDER — CHLORHEXIDINE GLUCONATE CLOTH 2 % EX PADS
6.0000 | MEDICATED_PAD | Freq: Every day | CUTANEOUS | Status: DC
  Administered 2021-05-29: 6 via TOPICAL

## 2021-05-29 MED ORDER — SODIUM CHLORIDE 0.9 % IV SOLN
250.0000 mg | Freq: Every day | INTRAVENOUS | Status: AC
  Administered 2021-05-30 – 2021-05-31 (×2): 250 mg via INTRAVENOUS
  Filled 2021-05-29 (×2): qty 20

## 2021-05-29 MED ORDER — DOCUSATE SODIUM 100 MG PO CAPS: 100.0000 mg | ORAL_CAPSULE | Freq: Two times a day (BID) | ORAL | Status: DC | PRN

## 2021-05-29 MED ORDER — ACETAMINOPHEN 325 MG PO TABS
650.0000 mg | ORAL_TABLET | ORAL | Status: DC | PRN
  Administered 2021-05-30 – 2021-06-04 (×6): 650 mg via ORAL
  Filled 2021-05-29 (×6): qty 2

## 2021-05-29 NOTE — Progress Notes (Signed)
Placed patient on bipap due to severe respiratory distress.  ?

## 2021-05-29 NOTE — ED Notes (Signed)
Unable to obtain temp at this time, pt is cold and clammy and has bipap mask on. ?

## 2021-05-29 NOTE — Progress Notes (Signed)
RT transported patient from ED to 2M without any complications. 

## 2021-05-29 NOTE — ED Triage Notes (Signed)
Pt BIB EMS, per report pt had been sitting in the car for approximately 1.5 hrs w/ resp distress. Pt's vitals for EMS 180/ palp, pulse of 80, spO2 60%,. Pt received .'3mg'$  of epi, and x2 duonebs en route. ?

## 2021-05-29 NOTE — H&P (Addendum)
? ?NAME:  Johnny [name], MRN:  [id number], DOB:  [birth year], LOS: 0 ?ADMISSION DATE:  05/29/2021, CONSULTATION DATE:  4.7 ?REFERRING MD:  Dr. Jeanell Sparrow, CHIEF COMPLAINT:  acute resp failure with hypoxia/hypercarbia ? 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 ? ?Patient states he has noticed a 30 pound weight gain in the past few weeks.  He states he is compliant with his Lasix.  Patient states that he had a low-grade fever of 99 ?F and chills today. His his UOP has been decreasing over the past week.  [place] nephrology outpatient. ? ?On 4/7, patient complaining of increased respiratory distress.  EMS called and transported patient to [facility] ED.  Initial sats for EMS was 60% and SBP 180.  Patient given 0.3 Mg of epi and X2 DuoNebs.   ? ?Upon arrival to [facility] ED on 4/7, patient's started on BiPAP for significant respite distress.  Patient noted to be hypertensive with SBP 160-180.  Started on nitro drip.  CXR showing extensive pulmonary edema with possible underlying pneumonia.  Lasix IV ordered.  Initial ABG 7.14, CO2 72, PO2 193, HCO3 25.  Repeat ABG improving after being on BiPAP: 7.28, 43, at 180, 20.6.  PCCM consulted for ICU admission. ? ?Pertinent ED labs: LA 1.3, CO2 19, AG 16, glucose 159, BUN 84, creatinine 11.88, BNP 928, troponin 30, WBC 12.2, Hgb 9.5.   COVID flu pending.  ? ?Pertinent  Medical History  ? ?Past Medical History:  ?Diagnosis Date  ? Acquired lymphedema 12/13/2017  ? Anemia   ? CHF (congestive heart failure) ([place])   ? Chronic kidney disease   ? Chronic Kidney disease-stage unknown  ? Diverticulosis of large intestine without hemorrhage 01/31/2012  ? Essential HTN 04/21/2006  ? Excess skin of thigh   ? left thigh adipose excess  ? Hypertension   ? OSA 05/21/2013  ? does not wear CPAP any longer  ? 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 Wears dentures   ? ? ? ?Significant Hospital Events: ?Including procedures, antibiotic start and stop dates in addition to other pertinent events   ?4/7: Patient admitted to [facility] with respiratory failure started on BiPAP.  Likely CHF exacerbation with pulmonary edema and worsening kidney function. ? ?Interim History / Subjective:  ?Patient alert/oriented; states his breathing is feeling a lot better after being on BiPAP. ?Patient on nitro drip with SBP 170s ? ?Objective   ?Blood pressure (!) 161/94, pulse 69, resp. rate 19, SpO2 96 %. ?   ?Vent Mode: PCV;BIPAP ?FiO2 (%):  [100 %] 100 % ?Set Rate:  [15 bmp] 15 bmp  ?No intake or output data in the 24 hours ending 05/29/21 2109 ?There were no vitals filed for this visit. ? ?Examination: ?General:   ill appearing on BiPAP ?HEENT: MM pink/moist; BiPAP in place ?Neuro: Aox3; MAE ?CV: s1s2, no m/r/g ?PULM: Coarse BS bilaterally; on BiPAP 70% with sats 97% ?GI: soft, bsx4 active  ?Extremities: warm/dry, BLE 3+ edema below the knee ?Skin: no rashes or lesions appreciated ? ? ?Resolved Hospital Problem list   ? ? ?Assessment & Plan:  ? ?Acute respiratory distress with hypoxia/hypercarbia ?Pulmonary edema ?Possible CAP ?P: ?-We will admit to ICU with continuous telemetry monitoring ?-Continue BiPAP ?-Wean FiO2 for sats greater than 92% ?-IV Lasix ?-Rocephin/azithromycin for possible pneumonia ?-Check PCT and MRSA  PCR ?-Follow COVID/flu results ?-prn DuoNebs for wheezing ? ?Possible CHF exacerbation: BNP 928 ?Hypertensive Emergency ?Hx of CHF: Echo 2009 LVEF 55-60% ?Hx of HTN ?P: ?-Giving IV Lasix ?-Continue nitro gtt for SBP goal less than 160 ?-Continue BiPAP  ?-repeat echo ?-trend troponin ?-Strict I/os; daily weights ?-Consider resuming home Coreg tomorrow in a.m. ? ?AKI on CKD 4: seen by nephrology outpatient last seen 2021 ?Anion gap acidosis ?P: ?-will consult nephro ?-lasix 80 IV; may need dialysis if refractory to lasix ?-Trend BMP / urinary  output ?-Replace electrolytes as indicated ?-Avoid nephrotoxic agents, ensure adequate renal perfusion ? ?Leukocytosis ?P: ?-Sending BC x2, UA, UC, expectorated sputum ?-Continue azithromycin and Rocephin ?-PCT and MRSA PCR ?-Trend WBC/fever curve ?-Trend lactic acid ? ?Hx of Anemia ?Hx of diverticulosis: Seen by GI outpatient ?P: ?-Trend CBC ? ? ? ?Best Practice (right click and "Reselect all SmartList Selections" daily)  ? ?Diet/type: NPO w/ oral meds ?DVT prophylaxis: prophylactic heparin  ?GI prophylaxis: PPI ?Lines: N/A ?Foley:  N/A ?Code Status:  full code ?Last date of multidisciplinary goals of care discussion [4/7 spoke with patient at bedside and updated on plan of care] ? ?Labs   ?CBC: ?Recent Labs  ?Lab 05/29/21 ?1919 05/29/21 ?1938 05/29/21 ?2027 05/29/21 ?2029  ?WBC 12.2*  --   --   --   ?NEUTROABS 9.2*  --   --   --   ?HGB 9.5* 9.2* 9.5* 9.9*  ?HCT 30.3* 27.0* 28.0* 29.0*  ?MCV 95.0  --   --   --   ?PLT 274  --   --   --   ? ? ?Basic Metabolic Panel: ?Recent Labs  ?Lab 05/29/21 ?1919 05/29/21 ?1938 05/29/21 ?2027 05/29/21 ?2029  ?NA 140 140 139 139  ?K 4.5 4.0 4.5 4.6  ?CL 105  --  109  --   ?CO2 19*  --   --   --   ?GLUCOSE 159*  --  155*  --   ?BUN 84*  --  92*  --   ?CREATININE 11.88*  --  13.60*  --   ?CALCIUM 7.8*  --   --   --   ? ?GFR: ?CrCl cannot be calculated (Unknown ideal weight.). ?Recent Labs  ?Lab 05/29/21 ?1919  ?WBC 12.2*  ?LATICACIDVEN 1.3  ? ? ?Liver Function Tests: ?Recent Labs  ?Lab 05/29/21 ?1919  ?AST 27  ?ALT 24  ?ALKPHOS 64  ?BILITOT 0.8  ?PROT 7.8  ?ALBUMIN 2.8*  ? ?No results for input(s): LIPASE, AMYLASE in the last 168 hours. ?No results for input(s): AMMONIA in the last 168 hours. ? ?ABG ?   ?Component Value Date/Time  ? PHART 7.146 (LL) 05/29/2021 1938  ? PCO2ART 72.5 ([place]) 05/29/2021 1938  ? PO2ART 193 (H) 05/29/2021 1938  ? HCO3 20.6 05/29/2021 2029  ? TCO2 22 05/29/2021 2029  ? ACIDBASEDEF 6.0 (H) 05/29/2021 2029  ? O2SAT 99 05/29/2021 2029  ?  ? ?Coagulation  Profile: ?No results for input(s): INR, PROTIME in the last 168 hours. ? ?Cardiac Enzymes: ?No results for input(s): CKTOTAL, CKMB, CKMBINDEX, TROPONINI in the last 168 hours. ? ?HbA1C: ?Hemoglobin A1C  ?Date/Time Value Ref Range Status  ?09/12/2014 04:23 PM 5.5  Final  ?09/05/2013 11:05 AM 5.9  Final  ? ?Hgb A1c MFr Bld  ?Date/Time Value Ref Range Status  ?06/12/2019 01:27 AM 5.3 4.8 - 5.6 % Final  ?  Comment:  ?  (NOTE) ?Pre diabetes:          5.7%-6.4% ?Diabetes:              >  6.4% ?Glycemic control for   <7.0% ?adults with diabetes ?  ? ? ?CBG: ?No results for input(s): GLUCAP in the last 168 hours. ? ?Review of Systems:   ?Unable to acquire too much information with bipap mask in place and patient became SOB with taking bipap off.  Obtained information from chart review and bedside nurse ? ?Past Medical History:  ?He,  has a past medical history of Acquired lymphedema (12/13/2017), Anemia, CHF (congestive heart failure) ([place]), Chronic kidney disease, Diverticulosis of large intestine without hemorrhage (01/31/2012), Essential HTN (04/21/2006), Excess skin of thigh, Hypertension, OSA (05/21/2013), PULMONARY EMBOLISM (11/12/2009), and Wears dentures.  ? ?Surgical History:  ? ?Past Surgical History:  ?Procedure Laterality Date  ? ABDOMINOPLASTY/PANNICULECTOMY WITH LIPOSUCTION Left 10/11/2018  ? Procedure: Excision of left thigh lipodystrophy;  Surgeon: Wallace Going, DO;  Location: [place];  Service: Plastics;  Laterality: Left;  ? COLONOSCOPY  01/31/2012  ? Procedure: COLONOSCOPY;  Surgeon: Inda Castle, MD;  Location: [facility];  Service: Endoscopy;  Laterality: N/A;  bmi is 67  ? COLONOSCOPY    ? COLONOSCOPY WITH PROPOFOL N/A 06/13/2019  ? Procedure: COLONOSCOPY WITH PROPOFOL;  Surgeon: Jackquline Denmark, MD;  Location: [facility];  Service: Endoscopy;  Laterality: N/A;  ? MULTIPLE TOOTH EXTRACTIONS    ? PANNICULECTOMY Bilateral 04/20/2018  ? Procedure: Excision of right inner thighs;  Surgeon: Wallace Going, DO;  Location: [place];  Service: Plastics;  Laterality: Bilateral;  Excision of right inner thighs  ? UPPER GASTROINTESTINAL ENDOSCOPY  06/05/2020  ? Lyndel Safe  ?  ? ?Social History:  ? reports that he has never smoked. He has n

## 2021-05-29 NOTE — ED Provider Notes (Signed)
?[facility] ?Provider Note ? ? ?CSN: [id number] ?Arrival date & time: 05/29/21  1910 ? ?  ? ?CRITICAL CARE ?Performed by: Lupita Dawn ? ? ?Total critical care time: 30 minutes ? ?Critical care time was exclusive of separately billable procedures and treating other patients. ? ?Critical care was necessary to treat or prevent imminent or life-threatening deterioration. ? ?Critical care was time spent personally by me on the following activities: development of treatment plan with patient and/or surrogate as well as nursing, discussions with consultants, evaluation of patient's response to treatment, examination of patient, obtaining history from patient or surrogate, ordering and performing treatments and interventions, ordering and review of laboratory studies, ordering and review of radiographic studies, pulse oximetry and re-evaluation of patient's condition. ? ? ?History ?Chief Complaint  ?Patient presents with  ? Respiratory Distress  ? ? ?Johnny [name] is a 61 y.o. 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  EMS reports he was hypoxic when they arrived to the 60s, they placed him on a nonrebreather, gave him Epi, DuoNebs with some improvement of his oxygenation status to the 80s.  Blood pressure with systolics of 621 systolic.  Patient was sitting in his chart and felt extremely short of breath for approximately an hour and a half before EMS arrived.  Patient denies any history of heart failure.  He reports bilateral extremity edema and scrotal edema.  Patient denies any chest pain but is very dyspneic and review of systems is limited secondary to this. ? ?HPI ? ?  ? ?Home Medications ?Prior to Admission medications   ?Medication Sig Start Date End Date Taking? Authorizing Provider  ?albuterol  (VENTOLIN HFA) 108 (90 Base) MCG/ACT inhaler Inhale 1-2 puffs into the lungs every 4 (four) hours as needed for wheezing or shortness of breath. ?Patient not taking: No sig reported 03/12/19   Raiford Noble Latif, DO  ?ascorbic acid (VITAMIN C) 500 MG tablet Take 1 tablet (500 mg total) by mouth daily. 03/13/19   Raiford Noble Latif, DO  ?carvedilol (COREG) 25 MG tablet Take 1 tablet (25 mg total) by mouth 2 (two) times daily with a meal. You are overdue for an appointment. You must schedule an appointment for additional refills. 04/29/21   Sharion Settler, DO  ?cholecalciferol (VITAMIN D3) 25 MCG (1000 UNIT) tablet Take 1,000 Units by mouth daily.    [provider]  ?furosemide (LASIX) 40 MG tablet Take 1 tablet (40 mg total) by mouth daily. 10/22/15   Harriet Butte, DO  ?RAYALDEE 30 MCG CPCR Take 30 mcg by mouth daily. 01/17/18   [provider]  ?zinc sulfate 220 (50 Zn) MG capsule Take 1 capsule (220 mg total) by mouth daily. 03/13/19   Kerney Elbe, DO  ?   ? ?Allergies    ?Lisinopril   ? ?Review of Systems   ?Review of Systems  ?Unable to perform ROS: Severe respiratory distress  ?Respiratory:  Positive for shortness of breath.   ? ?Physical Exam ?Updated Vital Signs ?BP (!) 137/92   Pulse 63   Resp 19   SpO2 100%  ?Physical Exam ?Vitals and nursing note reviewed.  ?Constitutional:   ?   General: He is in acute distress.  ?   Appearance: He is ill-appearing.  ?Cardiovascular:  ?   Rate and Rhythm: Tachycardia present.  ?  Pulmonary:  ?   Effort: Respiratory distress present.  ?   Breath sounds: Rhonchi present.  ?Abdominal:  ?   Tenderness: There is no guarding or rebound.  ?Musculoskeletal:  ?   Right lower leg: Edema present.  ?   Left lower leg: Edema present.  ?Skin: ?   Comments: Cool, clammy  ?Neurological:  ?   Mental Status: He is oriented to person, place, and time.  ? ? ?ED Results / Procedures / Treatments   ?Labs ?(all labs ordered are listed, but only abnormal results  are displayed) ?Labs Reviewed  ?COMPREHENSIVE METABOLIC PANEL - Abnormal; Notable for the following components:  ?    Result Value  ? CO2 19 (*)   ? Glucose, Bld 159 (*)   ? BUN 84 (*)   ? Creatinine, Ser 11.88 (*)   ? Calcium 7.8 (*)   ? Albumin 2.8 (*)   ? GFR, Estimated 4 (*)   ? Anion gap 16 (*)   ? All other components within normal limits  ?BRAIN NATRIURETIC PEPTIDE - Abnormal; Notable for the following components:  ? B Natriuretic Peptide 928.7 (*)   ? All other components within normal limits  ?CBC WITH DIFFERENTIAL/PLATELET - Abnormal; Notable for the following components:  ? WBC 12.2 (*)   ? RBC 3.19 (*)   ? Hemoglobin 9.5 (*)   ? HCT 30.3 (*)   ? Neutro Abs 9.2 (*)   ? All other components within normal limits  ?I-STAT VENOUS BLOOD GAS, ED - Abnormal; Notable for the following components:  ? pCO2, Ven 43.9 (*)   ? pO2, Ven 180 (*)   ? Acid-base deficit 6.0 (*)   ? Calcium, Ion 0.91 (*)   ? HCT 29.0 (*)   ? Hemoglobin 9.9 (*)   ? All other components within normal limits  ?I-STAT CHEM 8, ED - Abnormal; Notable for the following components:  ? BUN 92 (*)   ? Creatinine, Ser 13.60 (*)   ? Glucose, Bld 155 (*)   ? Calcium, Ion 0.90 (*)   ? Hemoglobin 9.5 (*)   ? HCT 28.0 (*)   ? All other components within normal limits  ?I-STAT ARTERIAL BLOOD GAS, ED - Abnormal; Notable for the following components:  ? pH, Arterial 7.146 (*)   ? pCO2 arterial 72.5 (*)   ? pO2, Arterial 193 (*)   ? Acid-base deficit 4.0 (*)   ? Calcium, Ion 1.05 (*)   ? HCT 27.0 (*)   ? Hemoglobin 9.2 (*)   ? All other components within normal limits  ?TROPONIN I (HIGH SENSITIVITY) - Abnormal; Notable for the following components:  ? Troponin I (High Sensitivity) 30 (*)   ? All other components within normal limits  ?RESP PANEL BY RT-PCR (FLU A&B, COVID) ARPGX2  ?MRSA NEXT GEN BY PCR, NASAL  ?CULTURE, BLOOD (ROUTINE X 2)  ?CULTURE, BLOOD (ROUTINE X 2)  ?URINE CULTURE  ?EXPECTORATED SPUTUM ASSESSMENT W GRAM STAIN, RFLX TO RESP C  ?LACTIC ACID,  PLASMA  ?URINALYSIS, COMPLETE (UACMP) WITH MICROSCOPIC  ?RAPID URINE DRUG SCREEN, HOSP PERFORMED  ?LACTIC ACID, PLASMA  ?LACTIC ACID, PLASMA  ?HIV ANTIBODY (ROUTINE TESTING W REFLEX)  ?CBC  ?MAGNESIUM  ?PROCALCITONIN  ?BLOOD GAS, ARTERIAL  ?CBC  ?RENAL FUNCTION PANEL  ?TROPONIN I (HIGH SENSITIVITY)  ? ? ?EKG ?None ? ?Radiology ?DG Chest 1 View ? ?Result Date: 05/29/2021 ?CLINICAL DATA:  Difficulty breathing, subcutaneous edema EXAM: CHEST  1 VIEW COMPARISON:  03/11/2019 FINDINGS: Transverse  diameter of heart is increased. Extensive alveolar densities seen throughout both lungs, more so in the left lower lung fields. Lateral CP angles are clear. There is no pneumothorax. IMPRESSION: Extensive alveolar densities seen in both lungs suggesting pulmonary edema. Possibility of underlying pneumonia is not excluded. Electronically Signed   By: Elmer Picker M.D.   On: 05/29/2021 19:56   ? ?Procedures ?Procedures  ? ?Medications Ordered in ED ?Medications  ?nitroGLYCERIN 50 mg in dextrose 5 % 250 mL (0.2 mg/mL) infusion (20 mcg/min Intravenous New Bag/Given 05/29/21 2014)  ?cefTRIAXone (ROCEPHIN) 2 g in sodium chloride 0.9 % 100 mL IVPB (0 g Intravenous Stopped 05/29/21 2303)  ?azithromycin (ZITHROMAX) 500 mg in sodium chloride 0.9 % 250 mL IVPB (500 mg Intravenous New Bag/Given 05/29/21 2224)  ?docusate sodium (COLACE) capsule 100 mg (has no administration in time range)  ?polyethylene glycol (MIRALAX / GLYCOLAX) packet 17 g (has no administration in time range)  ?heparin injection 5,000 Units (has no administration in time range)  ?acetaminophen (TYLENOL) tablet 650 mg (has no administration in time range)  ?pantoprazole (PROTONIX) EC tablet 40 mg (has no administration in time range)  ?ipratropium-albuterol (DUONEB) 0.5-2.5 (3) MG/3ML nebulizer solution 3 mL (has no administration in time range)  ?furosemide (LASIX) injection 80 mg (has no administration in time range)  ?calcitRIOL (ROCALTROL) capsule 1 mcg (has no  administration in time range)  ?lanthanum (FOSRENOL) chewable tablet 500 mg (has no administration in time range)  ?ferric gluconate (FERRLECIT) 250 mg in sodium chloride 0.9 % 250 mL IVPB (has no administration in tim

## 2021-05-29 NOTE — Consult Note (Signed)
Reason for Consult: Volume overload/hypoxic respiratory failure in patient with chronic kidney disease stage V ?Referring Physician: Pattricia Boss, MD ? ?HPI:  ?61 year old African-American man well-known to me from outpatient nephrology follow-up with past medical history significant for hypertension associated with LVH, morbid obesity, obstructive sleep apnea/OHS, history of prior polysubstance abuse, history of spontaneous DVT and PE in 2011, chronic lower extremity lymphedema (status post lymphedema reduction surgery) and stage V chronic kidney disease.  He has unfortunately delayed vascular access placement due to multiple social reasons. ? ?I saw him earlier in the week when he was feeling like "he was coming down with a cold and had developed some chills" without any cough, sputum production or fever.  Since then he reports to have had progressively worsening shortness of breath and exertional dyspnea to a point where today he had to present to the emergency room.  He denies any nausea, vomiting or diarrhea and other than for the limited history regarding shortness of breath, he is limited in his history provision because he is on BiPAP. ? ?Labs from 05/18/2021 showed sodium 142, potassium 3.8, bicarbonate 30, BUN 77, creatinine 10.1, glucose 100, calcium 8.2, phosphorus 5.8, albumin 3.3, magnesium 1.9, PTH 555, hemoglobin 8.6/hematocrit 26.0 with iron saturation 17% and ferritin 384.  Chest x-ray today shows pulmonary edema with possible CAP and patient noted to be hypertensive on intake and started on nitroglycerin drip. ? ?Past Medical History:  ?Diagnosis Date  ? Acquired lymphedema 12/13/2017  ? Anemia   ? CHF (congestive heart failure) ([place])   ? Chronic kidney disease   ? Chronic Kidney disease-stage unknown  ? Diverticulosis of large intestine without hemorrhage 01/31/2012  ? Essential HTN 04/21/2006  ? Excess skin of thigh   ? left thigh adipose excess  ? Hypertension   ? OSA 05/21/2013  ? does not wear  CPAP any longer  ? 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 Wears dentures   ? ? ?Past Surgical History:  ?Procedure Laterality Date  ? ABDOMINOPLASTY/PANNICULECTOMY WITH LIPOSUCTION Left 10/11/2018  ? Procedure: Excision of left thigh lipodystrophy;  Surgeon: Wallace Going, DO;  Location: [facility];  Service: Plastics;  Laterality: Left;  ? COLONOSCOPY  01/31/2012  ? Procedure: COLONOSCOPY;  Surgeon: Inda Castle, MD;  Location: [facility];  Service: Endoscopy;  Laterality: N/A;  bmi is 77  ? COLONOSCOPY    ? COLONOSCOPY WITH PROPOFOL N/A 06/13/2019  ? Procedure: COLONOSCOPY WITH PROPOFOL;  Surgeon: Jackquline Denmark, MD;  Location: [facility] ENDOSCOPY;  Service: Endoscopy;  Laterality: N/A;  ? MULTIPLE TOOTH EXTRACTIONS    ? PANNICULECTOMY Bilateral 04/20/2018  ? Procedure: Excision of right inner thighs;  Surgeon: Wallace Going, DO;  Location: [place];  Service: Plastics;  Laterality: Bilateral;  Excision of right inner thighs  ? UPPER GASTROINTESTINAL ENDOSCOPY  06/05/2020  ? Lyndel Safe  ? ? ?Family History  ?Problem Relation Age of Onset  ? Breast cancer Mother   ? Hypertension Mother   ? Colon cancer Father   ?     died at 16  ? Hypertension Brother   ? Esophageal cancer Neg Hx   ? Stomach cancer Neg Hx   ? Liver disease Neg Hx   ? Rectal cancer Neg Hx   ? ? ?Social History:  reports that he has never smoked. He has never used smokeless tobacco. He reports current alcohol use. He reports that he does not use drugs. ? ?Allergies:  ?Allergies  ?Allergen Reactions  ? Lisinopril Swelling  ?  Angioedema  ? ? ?Medications: I have reviewed the patient's current medications. ?Scheduled: ? furosemide  40 mg Intravenous Once  ? heparin  5,000 Units Subcutaneous Q8H  ? [START ON 05/30/2021] pantoprazole  40 mg Oral Daily  ? ? ?  Latest Ref Rng & Units 05/29/2021  ?  8:29 PM 05/29/2021  ?  8:27 PM 05/29/2021  ?  7:38 PM  ?BMP  ?Glucose 70 - 99 mg/dL  155     ?BUN 6 - 20 mg/dL  92     ?Creatinine 0.61 - 1.24 mg/dL  13.60     ?Sodium 135 - 145  mmol/L 139   139   140    ?Potassium 3.5 - 5.1 mmol/L 4.6   4.5   4.0    ?Chloride 98 - 111 mmol/L  109     ? ? ?  Latest Ref Rng & Units 05/29/2021  ?  8:29 PM 05/29/2021  ?  8:27 PM 05/29/2021  ?  7:38 PM  ?CBC  ?Hemoglobin 13.0 - 17.0 g/dL 9.9   9.5   9.2    ?Hematocrit 39.0 - 52.0 % 29.0   28.0   27.0    ? ? ? ?DG Chest 1 View ? ?Result Date: 05/29/2021 ?CLINICAL DATA:  Difficulty breathing, subcutaneous edema EXAM: CHEST  1 VIEW COMPARISON:  03/11/2019 FINDINGS: Transverse diameter of heart is increased. Extensive alveolar densities seen throughout both lungs, more so in the left lower lung fields. Lateral CP angles are clear. There is no pneumothorax. IMPRESSION: Extensive alveolar densities seen in both lungs suggesting pulmonary edema. Possibility of underlying pneumonia is not excluded. Electronically Signed   By: Elmer Picker M.D.   On: 05/29/2021 19:56   ? ?Review of Systems  ?Unable to perform ROS: Severe respiratory distress (On BiPAP, limited history)  ?Blood pressure (!) 147/91, pulse 68, resp. rate (!) 29, SpO2 99 %. ?Physical Exam ?Vitals and nursing note reviewed.  ?Constitutional:   ?   Appearance: He is obese. He is ill-appearing.  ?   Comments: On BiPAP, sleeping but awakens easily to calling out his name  ?HENT:  ?   Head: Normocephalic and atraumatic.  ?   Right Ear: External ear normal.  ?   Left Ear: External ear normal.  ?   Nose: Nose normal.  ?Eyes:  ?   General: No scleral icterus. ?   Extraocular Movements: Extraocular movements intact.  ?   Conjunctiva/sclera: Conjunctivae normal.  ?Cardiovascular:  ?   Rate and Rhythm: Normal rate and regular rhythm.  ?   Heart sounds: Normal heart sounds. No murmur heard. ?Pulmonary:  ?   Effort: Respiratory distress present.  ?   Breath sounds: Rales present.  ?   Comments: On BiPAP, appears comfortable when seen ?Abdominal:  ?   General: Bowel sounds are normal. There is distension.  ?   Palpations: Abdomen is soft.  ?   Tenderness: There is no  abdominal tenderness.  ?Musculoskeletal:  ?   Cervical back: Neck supple.  ?   Right lower leg: Edema present.  ?   Left lower leg: Edema present.  ?   Comments: Chronic lymphedema with 2+ superimposed pitting edema  ?Skin: ?   General: Skin is warm and dry.  ?Neurological:  ?   Mental Status: He is alert.  ? ? ?Assessment/Plan: ?1.  Acute hypoxic respiratory failure: Appears to likely be bifactorial from volume overload +/- pneumonia.  He has received 80 mg of furosemide in  the emergency room and I will start him on scheduled 80 mg IV 3 times daily.  He is currently on BiPAP for oxygenation support and anticipate volume unloading to help improve respiratory status further.  He has also been started on intravenous ceftriaxone and azithromycin for community-acquired pneumonia coverage. ?2.  Chronic kidney disease stage V: Anticipate that he will need dialysis access placement if not initiation of chronic hemodialysis during this hospitalization if he is refractory to diuretic therapy and has worsening azotemia with development of uremic symptoms. ?3.  Anemia: This appears to be anemia of chronic kidney disease with associated iron deficiency for which I will start him on intravenous iron and begin Aranesp. ?4.  Hypertension: This has been a chronic issue with difficulty in control in the past.  Anticipate improvement with volume unloading and restarting oral antihypertensive therapy/nitroglycerin drip. ?5.  Secondary hyperparathyroidism: Restart calcitriol for PTH control and begin phosphorus binder for hyperphosphatemia. ? ?Kenijah Benningfield K. ?05/29/2021, 10:10 PM  ? ? ? ? ?

## 2021-05-30 ENCOUNTER — Inpatient Hospital Stay (HOSPITAL_COMMUNITY): Payer: Medicaid Other

## 2021-05-30 DIAGNOSIS — I120 Hypertensive chronic kidney disease with stage 5 chronic kidney disease or end stage renal disease: Secondary | ICD-10-CM | POA: Diagnosis not present

## 2021-05-30 DIAGNOSIS — J9601 Acute respiratory failure with hypoxia: Secondary | ICD-10-CM | POA: Diagnosis not present

## 2021-05-30 DIAGNOSIS — J9602 Acute respiratory failure with hypercapnia: Secondary | ICD-10-CM | POA: Diagnosis not present

## 2021-05-30 DIAGNOSIS — I517 Cardiomegaly: Secondary | ICD-10-CM | POA: Diagnosis not present

## 2021-05-30 DIAGNOSIS — N185 Chronic kidney disease, stage 5: Secondary | ICD-10-CM | POA: Diagnosis not present

## 2021-05-30 DIAGNOSIS — J9690 Respiratory failure, unspecified, unspecified whether with hypoxia or hypercapnia: Secondary | ICD-10-CM | POA: Diagnosis not present

## 2021-05-30 DIAGNOSIS — N2581 Secondary hyperparathyroidism of renal origin: Secondary | ICD-10-CM | POA: Diagnosis not present

## 2021-05-30 DIAGNOSIS — Z4901 Encounter for fitting and adjustment of extracorporeal dialysis catheter: Secondary | ICD-10-CM | POA: Diagnosis not present

## 2021-05-30 DIAGNOSIS — N19 Unspecified kidney failure: Secondary | ICD-10-CM | POA: Diagnosis not present

## 2021-05-30 DIAGNOSIS — E8779 Other fluid overload: Secondary | ICD-10-CM | POA: Diagnosis not present

## 2021-05-30 DIAGNOSIS — D631 Anemia in chronic kidney disease: Secondary | ICD-10-CM | POA: Diagnosis not present

## 2021-05-30 HISTORY — PX: IR FLUORO GUIDE CV LINE RIGHT: IMG2283

## 2021-05-30 HISTORY — PX: IR US GUIDE VASC ACCESS RIGHT: IMG2390

## 2021-05-30 LAB — GLUCOSE, CAPILLARY: Glucose-Capillary: 107 mg/dL — ABNORMAL HIGH (ref 70–99)

## 2021-05-30 LAB — POCT I-STAT 7, (LYTES, BLD GAS, ICA,H+H)
Acid-base deficit: 4 mmol/L — ABNORMAL HIGH (ref 0.0–2.0)
Bicarbonate: 22.3 mmol/L (ref 20.0–28.0)
Calcium, Ion: 1.06 mmol/L — ABNORMAL LOW (ref 1.15–1.40)
HCT: 25 % — ABNORMAL LOW (ref 39.0–52.0)
Hemoglobin: 8.5 g/dL — ABNORMAL LOW (ref 13.0–17.0)
O2 Saturation: 97 %
Patient temperature: 98.6
Potassium: 4.2 mmol/L (ref 3.5–5.1)
Sodium: 140 mmol/L (ref 135–145)
TCO2: 24 mmol/L (ref 22–32)
pCO2 arterial: 47.5 mmHg (ref 32–48)
pH, Arterial: 7.28 — ABNORMAL LOW (ref 7.35–7.45)
pO2, Arterial: 105 mmHg (ref 83–108)

## 2021-05-30 LAB — RESP PANEL BY RT-PCR (FLU A&B, COVID) ARPGX2
Influenza A by PCR: NEGATIVE
Influenza B by PCR: NEGATIVE
SARS Coronavirus 2 by RT PCR: NEGATIVE

## 2021-05-30 LAB — CBC
HCT: 25 % — ABNORMAL LOW (ref 39.0–52.0)
Hemoglobin: 8.1 g/dL — ABNORMAL LOW (ref 13.0–17.0)
MCH: 29.9 pg (ref 26.0–34.0)
MCHC: 32.4 g/dL (ref 30.0–36.0)
MCV: 92.3 fL (ref 80.0–100.0)
Platelets: 227 10*3/uL (ref 150–400)
RBC: 2.71 MIL/uL — ABNORMAL LOW (ref 4.22–5.81)
RDW: 13.9 % (ref 11.5–15.5)
WBC: 8.8 10*3/uL (ref 4.0–10.5)
nRBC: 0 % (ref 0.0–0.2)

## 2021-05-30 LAB — ECHOCARDIOGRAM COMPLETE
Area-P 1/2: 2.26 cm2
Height: 69 in
S' Lateral: 3.4 cm
Single Plane A4C EF: 48.6 %
Weight: 5425.08 oz

## 2021-05-30 LAB — LACTIC ACID, PLASMA: Lactic Acid, Venous: 1.2 mmol/L (ref 0.5–1.9)

## 2021-05-30 LAB — RENAL FUNCTION PANEL
Albumin: 2.4 g/dL — ABNORMAL LOW (ref 3.5–5.0)
Anion gap: 15 (ref 5–15)
BUN: 87 mg/dL — ABNORMAL HIGH (ref 6–20)
CO2: 22 mmol/L (ref 22–32)
Calcium: 7.6 mg/dL — ABNORMAL LOW (ref 8.9–10.3)
Chloride: 103 mmol/L (ref 98–111)
Creatinine, Ser: 11.85 mg/dL — ABNORMAL HIGH (ref 0.61–1.24)
GFR, Estimated: 4 mL/min — ABNORMAL LOW (ref >60)
Glucose, Bld: 109 mg/dL — ABNORMAL HIGH (ref 70–99)
Phosphorus: 8.2 mg/dL — ABNORMAL HIGH (ref 2.5–4.6)
Potassium: 4.6 mmol/L (ref 3.5–5.1)
Sodium: 140 mmol/L (ref 135–145)

## 2021-05-30 LAB — PROCALCITONIN: Procalcitonin: 1.7 ng/mL

## 2021-05-30 LAB — HEPATITIS B SURFACE ANTIGEN: Hepatitis B Surface Ag: NONREACTIVE

## 2021-05-30 LAB — TROPONIN I (HIGH SENSITIVITY): Troponin I (High Sensitivity): 41 ng/L — ABNORMAL HIGH (ref <18)

## 2021-05-30 LAB — MAGNESIUM: Magnesium: 2 mg/dL (ref 1.7–2.4)

## 2021-05-30 LAB — MRSA NEXT GEN BY PCR, NASAL: MRSA by PCR Next Gen: NOT DETECTED

## 2021-05-30 LAB — HIV ANTIBODY (ROUTINE TESTING W REFLEX): HIV Screen 4th Generation wRfx: NONREACTIVE

## 2021-05-30 MED ORDER — HYDRALAZINE HCL 20 MG/ML IJ SOLN
20.0000 mg | Freq: Four times a day (QID) | INTRAMUSCULAR | Status: DC | PRN
  Administered 2021-05-30 – 2021-06-02 (×3): 20 mg via INTRAVENOUS
  Filled 2021-05-30 (×3): qty 1

## 2021-05-30 MED ORDER — LIDOCAINE HCL (PF) 1 % IJ SOLN: 5.0000 mL | INTRAMUSCULAR | Status: DC | PRN

## 2021-05-30 MED ORDER — HYDRALAZINE HCL 20 MG/ML IJ SOLN: 10.0000 mg | Freq: Four times a day (QID) | INTRAMUSCULAR | Status: DC | PRN

## 2021-05-30 MED ORDER — FENTANYL CITRATE (PF) 100 MCG/2ML IJ SOLN
INTRAMUSCULAR | Status: DC | PRN
  Administered 2021-05-30 (×2): 25 ug via INTRAVENOUS

## 2021-05-30 MED ORDER — CLEVIDIPINE BUTYRATE 0.5 MG/ML IV EMUL
0.0000 mg/h | INTRAVENOUS | Status: DC
  Administered 2021-05-30: 1 mg/h via INTRAVENOUS
  Administered 2021-05-30: 5 mg/h via INTRAVENOUS
  Administered 2021-05-30: 7 mg/h via INTRAVENOUS
  Filled 2021-05-30 (×3): qty 50

## 2021-05-30 MED ORDER — CARVEDILOL 12.5 MG PO TABS
12.5000 mg | ORAL_TABLET | Freq: Two times a day (BID) | ORAL | Status: DC
  Administered 2021-05-30 – 2021-06-04 (×10): 12.5 mg via ORAL
  Filled 2021-05-30 (×10): qty 1

## 2021-05-30 MED ORDER — SODIUM CHLORIDE 0.9 % IV SOLN: 100.0000 mL | INTRAVENOUS | Status: DC | PRN

## 2021-05-30 MED ORDER — LIDOCAINE-EPINEPHRINE 1 %-1:100000 IJ SOLN
INTRAMUSCULAR | Status: AC
  Filled 2021-05-30: qty 1

## 2021-05-30 MED ORDER — FENTANYL CITRATE (PF) 100 MCG/2ML IJ SOLN
INTRAMUSCULAR | Status: AC
Start: 2021-05-30 — End: 2021-05-30
  Filled 2021-05-30: qty 2

## 2021-05-30 MED ORDER — HEPARIN SODIUM (PORCINE) 1000 UNIT/ML IJ SOLN
INTRAMUSCULAR | Status: AC
  Filled 2021-05-30: qty 10

## 2021-05-30 MED ORDER — LIDOCAINE-PRILOCAINE 2.5-2.5 % EX CREA
1.0000 "application " | TOPICAL_CREAM | CUTANEOUS | Status: DC | PRN
  Filled 2021-05-30: qty 5

## 2021-05-30 MED ORDER — PENTAFLUOROPROP-TETRAFLUOROETH EX AERO: 1.0000 "application " | INHALATION_SPRAY | CUTANEOUS | Status: DC | PRN

## 2021-05-30 MED ORDER — HEPARIN SODIUM (PORCINE) 1000 UNIT/ML DIALYSIS
1000.0000 [IU] | INTRAMUSCULAR | Status: DC | PRN
  Administered 2021-05-31: 1000 [IU] via INTRAVENOUS_CENTRAL
  Filled 2021-05-30 (×2): qty 1

## 2021-05-30 MED ORDER — MIDAZOLAM HCL 2 MG/2ML IJ SOLN
INTRAMUSCULAR | Status: DC | PRN
  Administered 2021-05-30: .5 mg via INTRAVENOUS
  Administered 2021-05-30: 1 mg via INTRAVENOUS

## 2021-05-30 MED ORDER — CHLORHEXIDINE GLUCONATE CLOTH 2 % EX PADS
6.0000 | MEDICATED_PAD | Freq: Every day | CUTANEOUS | Status: DC
  Administered 2021-05-30 – 2021-06-04 (×6): 6 via TOPICAL

## 2021-05-30 MED ORDER — CEFAZOLIN SODIUM-DEXTROSE 2-4 GM/100ML-% IV SOLN
INTRAVENOUS | Status: DC | PRN
  Administered 2021-05-30: 2 g via INTRAVENOUS

## 2021-05-30 MED ORDER — AMLODIPINE BESYLATE 5 MG PO TABS
5.0000 mg | ORAL_TABLET | Freq: Every day | ORAL | Status: DC
Start: 2021-05-30 — End: 2021-06-02
  Administered 2021-05-30 – 2021-06-01 (×3): 5 mg via ORAL
  Filled 2021-05-30 (×2): qty 1

## 2021-05-30 MED ORDER — MIDAZOLAM HCL 2 MG/2ML IJ SOLN
INTRAMUSCULAR | Status: AC
  Filled 2021-05-30: qty 2

## 2021-05-30 MED ORDER — CEFAZOLIN SODIUM-DEXTROSE 2-4 GM/100ML-% IV SOLN
INTRAVENOUS | Status: AC
  Filled 2021-05-30: qty 100

## 2021-05-30 MED ORDER — LIDOCAINE-EPINEPHRINE 1 %-1:100000 IJ SOLN
INTRAMUSCULAR | Status: DC | PRN
  Administered 2021-05-30: 10 mL

## 2021-05-30 NOTE — Progress Notes (Signed)
Echocardiogram ?2D Echocardiogram has been performed. ? ?Johnny [name] ?05/30/2021, 2:50 PM ?

## 2021-05-30 NOTE — Progress Notes (Signed)
? ?NAME:  Johnny [name], MRN:  [id number], DOB:  03-19-1960, LOS: 1 ?ADMISSION DATE:  05/29/2021, CONSULTATION DATE:  4.7 ?REFERRING MD:  Dr. Jeanell Sparrow, CHIEF COMPLAINT:  acute resp failure with hypoxia/hypercarbia ? ?History of Present Illness:  ?Patient is a 61 year old male with pertinent PMH of OSA (does not wear CPAP), HTN, CHF (2009 Echo EF 55-60%), CKD 4 followed by nephrology presents to [facility] ED on 4/7 with hypoxic respiratory failure. ? ?Patient states he has noticed a 30 pound weight gain in the past few weeks.  He states he is compliant with his Lasix.  Patient states that he had a low-grade fever of 99 ?F and chills today. His his UOP has been decreasing over the past week.  [place] nephrology outpatient. On 4/7, patient complaining of increased respiratory distress.  EMS called and transported patient to [facility] ED.  Initial sats for EMS was 60% and SBP 180.  Patient given 0.3 Mg of epi and X2 DuoNebs.   ? ?Upon arrival to [facility] ED on 4/7, patient's started on BiPAP for significant respite distress.  Patient noted to be hypertensive with SBP 160-180.  Started on nitro drip.  CXR showing extensive pulmonary edema with possible underlying pneumonia.  Lasix IV ordered.  Initial ABG 7.14, CO2 72, PO2 193, HCO3 25.  Repeat ABG improving after being on BiPAP: 7.28, 43, at 180, 20.6.  PCCM consulted for ICU admission. ? ?Pertinent ED labs: LA 1.3, CO2 19, AG 16, glucose 159, BUN 84, creatinine 11.88, BNP 928, troponin 30, WBC 12.2, Hgb 9.5.   COVID flu pending.  ? ?Pertinent  Medical History  ? ?Past Medical History:  ?Diagnosis Date  ? Acquired lymphedema 12/13/2017  ? Anemia   ? CHF (congestive heart failure) ([place])   ? Chronic kidney disease   ? Chronic Kidney disease-stage unknown  ? Diverticulosis of large intestine without hemorrhage 01/31/2012  ? Essential HTN 04/21/2006  ? Excess skin of thigh   ? left thigh adipose excess  ? Hypertension   ? OSA 05/21/2013  ? does not wear CPAP any longer  ? 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 Wears dentures   ? ? ?Significant Hospital Events: ?Including procedures, antibiotic start and stop dates in addition to other pertinent events   ?4/7: Patient admitted to [facility] with respiratory failure started on BiPAP.  Likely CHF exacerbation with pulmonary edema and worsening kidney function. ? ?Interim History / Subjective:  ? ?Patient appears comfortable.  Was using BiPAP therapy overnight.  Very little urine output with Lasix. ? ?Objective   ?Blood pressure 118/79, pulse 75, temperature (!) 96.3 ?F (35.7 ?C), temperature source Axillary, resp. rate (!) 23, height '5\' 9"'$  (1.753 m), weight (!) 153.8 kg, SpO2 98 %. ?   ?Vent Mode: PCV;BIPAP ?FiO2 (%):  [45 %-100 %] 45 % ?Set Rate:  [8 bmp-15 bmp] 8 bmp ?PEEP:  [8 cmH20-10 cmH20] 8 cmH20  ? ?Intake/Output Summary (Last 24 hours) at 05/30/2021 0833 ?Last data filed at 05/30/2021 0800 ?Gross per 24 hour  ?Intake 468.89 ml  ?Output 250 ml  ?Net 218.89 ml  ? ?Filed Weights  ? 05/29/21 2345 05/30/21 0420  ?Weight: (!) 153.8 kg (!) 153.8 kg  ? ? ?Examination: ?General:   Taken off BiPAP this morning, appears comfortable ?HEENT: Mucous membranes dry ?Neuro: Alert oriented, following commands ?CV: Tachycardic, regular rhythm S1-S2 ?PULM: Crackles in the bases, overall respirations diminished ?GI: Soft, nontender nondistended, obese pannus ?Extremities: Significant bilateral lower extremity ? ?Resolved Hospital Problem list   ? ? ?  Assessment & Plan:  ? ?Acute respiratory distress with hypoxia/hypercarbia ?Pulmonary edema ?P: ?Stop antibiotics, observe for any sign of infection.  Continue BiPAP as needed. ?Continue IV Lasix. ?Discussed with nephrology, will likely need tunneled Hickman catheter and initiation of HD. ? ?Possible CHF exacerbation: BNP 928 ?Hypertensive Emergency ?Hx of CHF: Echo 2009 LVEF 55-60% ?Hx of HTN ?P: ?Restart Coreg, amlodipine and stop Cleviprex ?As needed hydralazine ? ?AKI on CKD 4: seen by nephrology outpatient last seen 2021 ?Anion gap  acidosis ?P: ?Continue IV Lasix ?Discussed with nephrology ?Plan for IR placement of tunneled Hickman catheter. ?Plans for initiation of dialysis. ? ?Leukocytosis ?P: ?Symptoms are infection.  Holding antibiotics. ? ?Hx of Anemia ?Hx of diverticulosis: Seen by GI outpatient ?P: ?Follow-up CBC ? ? ? ?Best Practice (right click and "Reselect all SmartList Selections" daily)  ? ?Diet/type: NPO w/ oral meds ?DVT prophylaxis: prophylactic heparin  ?GI prophylaxis: PPI ?Lines: N/A ?Foley:  N/A ?Code Status:  full code ?Last date of multidisciplinary goals of care discussion [4/7 spoke with patient at bedside and updated on plan of care] ? ?Labs   ?CBC: ?Recent Labs  ?Lab 05/29/21 ?1919 05/29/21 ?1938 05/29/21 ?2027 05/29/21 ?2029 05/30/21 ?0272 05/30/21 ?0418  ?WBC 12.2*  --   --   --  8.8  --   ?NEUTROABS 9.2*  --   --   --   --   --   ?HGB 9.5* 9.2* 9.5* 9.9* 8.1* 8.5*  ?HCT 30.3* 27.0* 28.0* 29.0* 25.0* 25.0*  ?MCV 95.0  --   --   --  92.3  --   ?PLT 274  --   --   --  227  --   ? ? ?Basic Metabolic Panel: ?Recent Labs  ?Lab 05/29/21 ?1919 05/29/21 ?1938 05/29/21 ?2027 05/29/21 ?2029 05/30/21 ?5366 05/30/21 ?0418  ?NA 140 140 139 139 140 140  ?K 4.5 4.0 4.5 4.6 4.6 4.2  ?CL 105  --  109  --  103  --   ?CO2 19*  --   --   --  22  --   ?GLUCOSE 159*  --  155*  --  109*  --   ?BUN 84*  --  92*  --  87*  --   ?CREATININE 11.88*  --  13.60*  --  11.85*  --   ?CALCIUM 7.8*  --   --   --  7.6*  --   ?MG  --   --   --   --  2.0  --   ?PHOS  --   --   --   --  8.2*  --   ? ?GFR: ?Estimated Creatinine Clearance: 9.9 mL/min (A) (by C-G formula based on SCr of 11.85 mg/dL (H)). ?Recent Labs  ?Lab 05/29/21 ?1919 05/30/21 ?4403  ?PROCALCITON  --  1.70  ?WBC 12.2* 8.8  ?LATICACIDVEN 1.3 1.2  ? ? ?Liver Function Tests: ?Recent Labs  ?Lab 05/29/21 ?1919 05/30/21 ?4742  ?AST 27  --   ?ALT 24  --   ?ALKPHOS 64  --   ?BILITOT 0.8  --   ?PROT 7.8  --   ?ALBUMIN 2.8* 2.4*  ? ?No results for input(s): LIPASE, AMYLASE in the last 168  hours. ?No results for input(s): AMMONIA in the last 168 hours. ? ?ABG ?   ?Component Value Date/Time  ? PHART 7.280 (L) 05/30/2021 0418  ? PCO2ART 47.5 05/30/2021 0418  ? PO2ART 105 05/30/2021 0418  ? HCO3 22.3 05/30/2021 0418  ?  TCO2 24 05/30/2021 0418  ? ACIDBASEDEF 4.0 (H) 05/30/2021 0418  ? O2SAT 97 05/30/2021 0418  ?  ? ?Coagulation Profile: ?No results for input(s): INR, PROTIME in the last 168 hours. ? ?Cardiac Enzymes: ?No results for input(s): CKTOTAL, CKMB, CKMBINDEX, TROPONINI in the last 168 hours. ? ?HbA1C: ?Hemoglobin A1C  ?Date/Time Value Ref Range Status  ?09/12/2014 04:23 PM 5.5  Final  ?09/05/2013 11:05 AM 5.9  Final  ? ?Hgb A1c MFr Bld  ?Date/Time Value Ref Range Status  ?06/12/2019 01:27 AM 5.3 4.8 - 5.6 % Final  ?  Comment:  ?  (NOTE) ?Pre diabetes:          5.7%-6.4% ?Diabetes:              >6.4% ?Glycemic control for   <7.0% ?adults with diabetes ?  ? ? ?CBG: ?Recent Labs  ?Lab 05/29/21 ?2343 05/30/21 ?0357  ?GLUCAP 105* 107*  ? ? ?Review of Systems:   ?Unable to acquire too much information with bipap mask in place and patient became SOB with taking bipap off.  Obtained information from chart review and bedside nurse ? ?Past Medical History:  ?He,  has a past medical history of Acquired lymphedema (12/13/2017), Anemia, CHF (congestive heart failure) ([place]), Chronic kidney disease, Diverticulosis of large intestine without hemorrhage (01/31/2012), Essential HTN (04/21/2006), Excess skin of thigh, Hypertension, OSA (05/21/2013), PULMONARY EMBOLISM (11/12/2009), and Wears dentures.  ? ?Surgical History:  ? ?Past Surgical History:  ?Procedure Laterality Date  ? ABDOMINOPLASTY/PANNICULECTOMY WITH LIPOSUCTION Left 10/11/2018  ? Procedure: Excision of left thigh lipodystrophy;  Surgeon: Wallace Going, DO;  Location: [place];  Service: Plastics;  Laterality: Left;  ? COLONOSCOPY  01/31/2012  ? Procedure: COLONOSCOPY;  Surgeon: Inda Castle, MD;  Location: [facility];  Service: Endoscopy;   Laterality: N/A;  bmi is 64  ? COLONOSCOPY    ? COLONOSCOPY WITH PROPOFOL N/A 06/13/2019  ? Procedure: COLONOSCOPY WITH PROPOFOL;  Surgeon: Jackquline Denmark, MD;  Location: [facility] ENDOSCOPY;  Service: Endoscopy;  Laterality: N/A;  ? MU

## 2021-05-30 NOTE — Progress Notes (Signed)
?[facility] ?Progress Note  ? ?Assessment/ Plan:   ?1.  Acute hypoxic respiratory failure: CAP + Vol overload ?- on azithro/ CTX ?- not a lot of response to IV diuresis ?- initating HD as below in #2 ? ?2.  Chronic kidney disease stage V:  ?- not a lot of response to high- dose Lasix ?- initating HD- NPO, TDC placed, HD #1 today after [facility] ?- will c/s VVS Monday for AVF ?- will need to be CLIP'd ? ?3.  Anemia:  ?- IV iron and ESA ? ?4.  Hypertension:  ?- on cleviprex gtt--> anticipate improvement once we get fluid off ? ?5.  Secondary hyperparathyroidism: Restart calcitriol for PTH control and begin phosphorus binder for hyperphosphatemia. ? ?Subjective:   ? ?Seen in room.  Only 250 mL UOP out with high-dose IV Lasix.  Discussed with pt- on Cleviprex gtt- this is event that tips him to ESRD.  NPO, order for [facility] placed- great appreciation to IR.  HD orders written.    ? ?Objective:   ?BP 130/85   Pulse 76   Temp (!) 96.3 ?F (35.7 ?C) (Axillary)   Resp 19   Ht '5\' 9"'$  (1.753 m)   Wt (!) 153.8 kg   SpO2 91%   BMI 50.07 kg/m?  ? ?Physical Exam: ?[id number] up in bed ?CVS: RRR + S3 ?Resp: bilateral crackles ?Abd: soft, + abd wall edema ?Ext: 3+ anasarca ? ?Labs: ?BMET ?Recent Labs  ?Lab 05/29/21 ?1919 05/29/21 ?1938 05/29/21 ?2027 05/29/21 ?2029 05/30/21 ?7939 05/30/21 ?0418  ?NA 140 140 139 139 140 140  ?K 4.5 4.0 4.5 4.6 4.6 4.2  ?CL 105  --  109  --  103  --   ?CO2 19*  --   --   --  22  --   ?GLUCOSE 159*  --  155*  --  109*  --   ?BUN 84*  --  92*  --  87*  --   ?CREATININE 11.88*  --  13.60*  --  11.85*  --   ?CALCIUM 7.8*  --   --   --  7.6*  --   ?PHOS  --   --   --   --  8.2*  --   ? ?CBC ?Recent Labs  ?Lab 05/29/21 ?1919 05/29/21 ?1938 05/29/21 ?2027 05/29/21 ?2029 05/30/21 ?0300 05/30/21 ?0418  ?WBC 12.2*  --   --   --  8.8  --   ?NEUTROABS 9.2*  --   --   --   --   --   ?HGB 9.5*   < > 9.5* 9.9* 8.1* 8.5*  ?HCT 30.3*   < > 28.0* 29.0* 25.0* 25.0*  ?MCV 95.0  --   --   --  92.3  --   ?PLT  274  --   --   --  227  --   ? < > = values in this interval not displayed.  ? ? ?  ?Medications:   ? ? amLODipine  5 mg Oral Daily  ? calcitRIOL  1 mcg Oral Daily  ? carvedilol  12.5 mg Oral BID WC  ? chlorhexidine  15 mL Mouth Rinse BID  ? Chlorhexidine Gluconate Cloth  6 each Topical Daily  ? Chlorhexidine Gluconate Cloth  6 each Topical Q0600  ? furosemide  80 mg Intravenous Q8H  ? heparin  5,000 Units Subcutaneous Q8H  ? lanthanum  500 mg Oral TID WC  ? mouth rinse  15 mL Mouth  Rinse q12n4p  ? pantoprazole  40 mg Oral Daily  ? ? ? ?Madelon Lips MD ?05/30/2021, 9:46 AM   ?

## 2021-05-30 NOTE — Progress Notes (Signed)
2D Echo attempted, patient en route to IR. ?Will come back later as schedule permits ? ?[place] ?

## 2021-05-30 NOTE — Procedures (Signed)
?  Procedure:  R IJ tunneled HD CVC placement Palindrome 23cm ?Preprocedure diagnosis: The primary encounter diagnosis was Flash pulmonary edema ([place]). A diagnosis of Acute renal failure, unspecified acute renal failure type [facility]) was also pertinent to this visit. ? ?Postprocedure diagnosis: same ?EBL:    minimal ?Complications:   none immediate ? ?See full dictation in [place]. ? ?D. Arne Cleveland MD ?Main # [phone] ?Pager  [phone] ?Mobile [phone] ?  ? ?

## 2021-05-30 NOTE — Progress Notes (Signed)
eLink Physician-Brief Progress Note ?[name] ?DOB: 03-Oct-1960 ?MRN: [id number] ? ? ?Date of Service ? 05/30/2021  ?HPI/Events of Note ? 72M with OSA nonadherent to CPAP, CKD V, HTN admitted for AHRF 2/2 volume overload requiring BiPAP and HTN emergency on nitro gtt. CXR pulm edema. ABG with acute hypercarbic respiratory failure. PCCM for admission ? ?On camera, patient awake and nodding to questions, on BIPAP FIO2 60%. BP 164/99 on nitro 70 ?  ?eICU Interventions ? Nephrology following ?Diuresis  ?Monitor UOP ?BiPAP ?Switch from nitro to cleviprex ?  ? ? ? ?Intervention Category ?Evaluation Type: New Patient Evaluation ? ?Ernst Cumpston Rodman Pickle ?05/30/2021, 12:15 AM ?

## 2021-05-30 NOTE — Consult Note (Signed)
? ?Chief Complaint: Patient was seen in consultation today for tunneled HD catheter placement ? ?Referring Physician(s): Madelon Lips, MD ? ?Supervising Physician: Arne Cleveland ? ?Patient Status: [facility] - In-pt ? ?History of Present Illness: ?Johnny [name] is a 61 y.o. male with a past medical history significant for anemia, CHF, HTN, OSA, DVT/PE (2011), CKD V who presented to [facility] ED on 4/7 with complaints of dyspnea. On EMS arrival his spo2 was noted to be 60% and SBP 180s, he was eventually started on BiPAP in the ED for respiratory distress. Imaging showed extensive pulmonary edema and possible underlying pneumonia. He was started on IV lasix and admitted for further evaluation. Nephrology was consulted and due to little response from high dose lasix they are planning to initiate HD. IR has been consulted for tunneled HD catheter placement. ? ?Patient seen in IR, he is anxious about the procedure and need for HD. He is wondering if he will be able to work once he is discharged. He is asking if the fistula will be placed today as well. Reviewed tunneled HD catheter procedure as well as consult with VVS for possible fistula/graft placement which will not be done today in IR - patient understands and is agreeable to proceed. ? ?Past Medical History:  ?Diagnosis Date  ? Acquired lymphedema 12/13/2017  ? Anemia   ? CHF (congestive heart failure) ([place])   ? Chronic kidney disease   ? Chronic Kidney disease-stage unknown  ? Diverticulosis of large intestine without hemorrhage 01/31/2012  ? Essential HTN 04/21/2006  ? Excess skin of thigh   ? left thigh adipose excess  ? Hypertension   ? OSA 05/21/2013  ? does not wear CPAP any longer  ? 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 Wears dentures   ? ? ?Past Surgical History:  ?Procedure Laterality Date  ? ABDOMINOPLASTY/PANNICULECTOMY WITH LIPOSUCTION Left 10/11/2018  ? Procedure: Excision of left thigh lipodystrophy;  Surgeon: Wallace Going, DO;  Location: [place];   Service: Plastics;  Laterality: Left;  ? COLONOSCOPY  01/31/2012  ? Procedure: COLONOSCOPY;  Surgeon: Inda Castle, MD;  Location: [facility];  Service: Endoscopy;  Laterality: N/A;  bmi is 42  ? COLONOSCOPY    ? COLONOSCOPY WITH PROPOFOL N/A 06/13/2019  ? Procedure: COLONOSCOPY WITH PROPOFOL;  Surgeon: Jackquline Denmark, MD;  Location: [facility] ENDOSCOPY;  Service: Endoscopy;  Laterality: N/A;  ? MULTIPLE TOOTH EXTRACTIONS    ? PANNICULECTOMY Bilateral 04/20/2018  ? Procedure: Excision of right inner thighs;  Surgeon: Wallace Going, DO;  Location: [place];  Service: Plastics;  Laterality: Bilateral;  Excision of right inner thighs  ? UPPER GASTROINTESTINAL ENDOSCOPY  06/05/2020  ? Lyndel Safe  ? ? ?Allergies: ?Lisinopril ? ?Medications: ?Prior to Admission medications   ?Medication Sig Start Date End Date Taking? Authorizing Provider  ?albuterol (VENTOLIN HFA) 108 (90 Base) MCG/ACT inhaler Inhale 1-2 puffs into the lungs every 4 (four) hours as needed for wheezing or shortness of breath. ?Patient not taking: No sig reported 03/12/19   Raiford Noble Latif, DO  ?ascorbic acid (VITAMIN C) 500 MG tablet Take 1 tablet (500 mg total) by mouth daily. 03/13/19   Raiford Noble Latif, DO  ?carvedilol (COREG) 25 MG tablet Take 1 tablet (25 mg total) by mouth 2 (two) times daily with a meal. You are overdue for an appointment. You must schedule an appointment for additional refills. 04/29/21   Sharion Settler, DO  ?cholecalciferol (VITAMIN D3) 25 MCG (1000 UNIT) tablet Take 1,000 Units by mouth daily.  [provider]  ?furosemide (LASIX) 40 MG tablet Take 1 tablet (40 mg total) by mouth daily. 10/22/15   Harriet Butte, DO  ?RAYALDEE 30 MCG CPCR Take 30 mcg by mouth daily. 01/17/18   [provider]  ?zinc sulfate 220 (50 Zn) MG capsule Take 1 capsule (220 mg total) by mouth daily. 03/13/19   Kerney Elbe, DO  ?  ? ?Family History  ?Problem Relation Age of Onset  ? Breast cancer Mother   ? Hypertension Mother    ? Colon cancer Father   ?     died at 65  ? Hypertension Brother   ? Esophageal cancer Neg Hx   ? Stomach cancer Neg Hx   ? Liver disease Neg Hx   ? Rectal cancer Neg Hx   ? ? ?Social History  ? ?Socioeconomic History  ? Marital status: Single  ?  Spouse name: Not on file  ? Number of children: 1  ? Years of education: Not on file  ? Highest education level: Not on file  ?Occupational History  ? Occupation: disabled  ?  Employer: UNEMPLOYED  ?Tobacco Use  ? Smoking status: Never  ? Smokeless tobacco: Never  ?Vaping Use  ? Vaping Use: Never used  ?Substance and Sexual Activity  ? Alcohol use: Yes  ?  Comment: social-once a week  ? Drug use: No  ?  Comment: stopped marijuana about Sep or Oct 2013  ? Sexual activity: Not on file  ?Other Topics Concern  ? Not on file  ?Social History Narrative  ? Not on file  ? ?Social Determinants of Health  ? ?Financial Resource Strain: Not on file  ?Food Insecurity: Not on file  ?Transportation Needs: Not on file  ?Physical Activity: Not on file  ?Stress: Not on file  ?Social Connections: Not on file  ? ? ? ?Review of Systems: A 12 point ROS discussed and pertinent positives are indicated in the HPI above.  All other systems are negative. ? ?Review of Systems  ?Constitutional:  Negative for chills and fever.  ?Respiratory:  Positive for shortness of breath. Negative for cough.   ?Cardiovascular:  Negative for chest pain.  ?Gastrointestinal:  Negative for abdominal pain, nausea and vomiting.  ?Neurological:  Negative for headaches.  ? ?Vital Signs: ?BP 130/85   Pulse 76   Temp (!) 96.3 ?F (35.7 ?C) (Axillary)   Resp 19   Ht '5\' 9"'$  (1.753 m)   Wt (!) 339 lb 1.1 oz (153.8 kg)   SpO2 91%   BMI 50.07 kg/m?  ? ?Physical Exam ?Vitals and nursing note reviewed.  ?Constitutional:   ?   General: He is not in acute distress. ?HENT:  ?   Head: Normocephalic.  ?   Mouth/Throat:  ?   Mouth: Mucous membranes are moist.  ?   Pharynx: Oropharynx is clear. No oropharyngeal exudate or posterior  oropharyngeal erythema.  ?Cardiovascular:  ?   Rate and Rhythm: Normal rate and regular rhythm.  ?Pulmonary:  ?   Effort: Pulmonary effort is normal.  ?   Breath sounds: Normal breath sounds.  ?Abdominal:  ?   General: There is no distension.  ?   Palpations: Abdomen is soft.  ?   Tenderness: There is no abdominal tenderness.  ?Skin: ?   General: Skin is warm and dry.  ?Neurological:  ?   Mental Status: He is alert and oriented to person, place, and time.  ?Psychiatric:     ?  Mood and Affect: Mood normal.     ?   Behavior: Behavior normal.     ?   Thought Content: Thought content normal.     ?   Judgment: Judgment normal.  ? ? ? ?MD Evaluation ?Airway: WNL ?Heart: WNL ?Abdomen: WNL ?Chest/ Lungs: WNL ?ASA  Classification: 3 ?Mallampati/Airway Score: Two ? ? ?Imaging: ?DG Chest 1 View ? ?Result Date: 05/29/2021 ?CLINICAL DATA:  Difficulty breathing, subcutaneous edema EXAM: CHEST  1 VIEW COMPARISON:  03/11/2019 FINDINGS: Transverse diameter of heart is increased. Extensive alveolar densities seen throughout both lungs, more so in the left lower lung fields. Lateral CP angles are clear. There is no pneumothorax. IMPRESSION: Extensive alveolar densities seen in both lungs suggesting pulmonary edema. Possibility of underlying pneumonia is not excluded. Electronically Signed   By: Elmer Picker M.D.   On: 05/29/2021 19:56  ? ?DG Chest Port 1 View ? ?Result Date: 05/30/2021 ?CLINICAL DATA:  Respiratory failure EXAM: PORTABLE CHEST 1 VIEW COMPARISON:  Yesterday FINDINGS: Improved but still extensive airspace disease with mainly peripheral and apical clearing. Cardiomegaly and vascular pedicle widening. No visible pleural effusion or pneumothorax IMPRESSION: Improved but still extensive airspace disease with pattern suggesting alveolar edema. Electronically Signed   By: Jorje Guild M.D.   On: 05/30/2021 07:02   ? ?Labs: ? ?CBC: ?Recent Labs  ?  05/29/21 ?1919 05/29/21 ?1938 05/29/21 ?2027 05/29/21 ?2029  05/30/21 ?8099 05/30/21 ?0418  ?WBC 12.2*  --   --   --  8.8  --   ?HGB 9.5*   < > 9.5* 9.9* 8.1* 8.5*  ?HCT 30.3*   < > 28.0* 29.0* 25.0* 25.0*  ?PLT 274  --   --   --  227  --   ? < > = values in this interval not displaye

## 2021-05-31 DIAGNOSIS — J9602 Acute respiratory failure with hypercapnia: Secondary | ICD-10-CM | POA: Diagnosis not present

## 2021-05-31 DIAGNOSIS — N185 Chronic kidney disease, stage 5: Secondary | ICD-10-CM | POA: Diagnosis not present

## 2021-05-31 DIAGNOSIS — E8779 Other fluid overload: Secondary | ICD-10-CM | POA: Diagnosis not present

## 2021-05-31 DIAGNOSIS — D631 Anemia in chronic kidney disease: Secondary | ICD-10-CM | POA: Diagnosis not present

## 2021-05-31 DIAGNOSIS — J9601 Acute respiratory failure with hypoxia: Secondary | ICD-10-CM | POA: Diagnosis not present

## 2021-05-31 DIAGNOSIS — I120 Hypertensive chronic kidney disease with stage 5 chronic kidney disease or end stage renal disease: Secondary | ICD-10-CM | POA: Diagnosis not present

## 2021-05-31 DIAGNOSIS — N2581 Secondary hyperparathyroidism of renal origin: Secondary | ICD-10-CM | POA: Diagnosis not present

## 2021-05-31 LAB — CBC
HCT: 23.2 % — ABNORMAL LOW (ref 39.0–52.0)
HCT: 23.7 % — ABNORMAL LOW (ref 39.0–52.0)
Hemoglobin: 7.5 g/dL — ABNORMAL LOW (ref 13.0–17.0)
Hemoglobin: 7.6 g/dL — ABNORMAL LOW (ref 13.0–17.0)
MCH: 29.4 pg (ref 26.0–34.0)
MCH: 29.3 pg (ref 26.0–34.0)
MCHC: 32.3 g/dL (ref 30.0–36.0)
MCHC: 32.1 g/dL (ref 30.0–36.0)
MCV: 91 fL (ref 80.0–100.0)
MCV: 91.5 fL (ref 80.0–100.0)
Platelets: 212 10*3/uL (ref 150–400)
Platelets: 214 10*3/uL (ref 150–400)
RBC: 2.55 MIL/uL — ABNORMAL LOW (ref 4.22–5.81)
RBC: 2.59 MIL/uL — ABNORMAL LOW (ref 4.22–5.81)
RDW: 14.2 % (ref 11.5–15.5)
RDW: 14.4 % (ref 11.5–15.5)
WBC: 7.5 10*3/uL (ref 4.0–10.5)
WBC: 7.2 10*3/uL (ref 4.0–10.5)
nRBC: 0 % (ref 0.0–0.2)
nRBC: 0 % (ref 0.0–0.2)

## 2021-05-31 LAB — BASIC METABOLIC PANEL
Anion gap: 16 — ABNORMAL HIGH (ref 5–15)
BUN: 95 mg/dL — ABNORMAL HIGH (ref 6–20)
CO2: 19 mmol/L — ABNORMAL LOW (ref 22–32)
Calcium: 7.7 mg/dL — ABNORMAL LOW (ref 8.9–10.3)
Chloride: 105 mmol/L (ref 98–111)
Creatinine, Ser: 12.21 mg/dL — ABNORMAL HIGH (ref 0.61–1.24)
GFR, Estimated: 4 mL/min — ABNORMAL LOW (ref >60)
Glucose, Bld: 95 mg/dL (ref 70–99)
Potassium: 4.1 mmol/L (ref 3.5–5.1)
Sodium: 140 mmol/L (ref 135–145)

## 2021-05-31 LAB — HEPATITIS B SURFACE ANTIBODY, QUANTITATIVE: Hep B S AB Quant (Post): <3.1 m[IU]/mL — ABNORMAL LOW (ref >9.9)

## 2021-05-31 LAB — URINE CULTURE: Culture: 80000 — AB

## 2021-05-31 LAB — MAGNESIUM: Magnesium: 2 mg/dL (ref 1.7–2.4)

## 2021-05-31 LAB — PHOSPHORUS: Phosphorus: 8 mg/dL — ABNORMAL HIGH (ref 2.5–4.6)

## 2021-05-31 LAB — HEPATITIS B SURFACE ANTIBODY,QUALITATIVE: Hep B S Ab: NONREACTIVE

## 2021-05-31 NOTE — Progress Notes (Signed)
? ?NAME:  Johnny [name], MRN:  [id number], DOB:  October 03, 1960, LOS: 2 ?ADMISSION DATE:  05/29/2021, CONSULTATION DATE:  4.7 ?REFERRING MD:  Dr. Jeanell Sparrow, CHIEF COMPLAINT:  acute resp failure with hypoxia/hypercarbia ? ?History of Present Illness:  ?Patient is a 61 year old male with pertinent PMH of OSA (does not wear CPAP), HTN, CHF (2009 Echo EF 55-60%), CKD 4 followed by nephrology presents to [facility] ED on 4/7 with hypoxic respiratory failure. ? ?Patient states he has noticed a 30 pound weight gain in the past few weeks.  He states he is compliant with his Lasix.  Patient states that he had a low-grade fever of 99 ?F and chills today. His his UOP has been decreasing over the past week.  [place] nephrology outpatient. On 4/7, patient complaining of increased respiratory distress.  EMS called and transported patient to [facility] ED.  Initial sats for EMS was 60% and SBP 180.  Patient given 0.3 Mg of epi and X2 DuoNebs.   ? ?Upon arrival to [facility] ED on 4/7, patient's started on BiPAP for significant respite distress.  Patient noted to be hypertensive with SBP 160-180.  Started on nitro drip.  CXR showing extensive pulmonary edema with possible underlying pneumonia.  Lasix IV ordered.  Initial ABG 7.14, CO2 72, PO2 193, HCO3 25.  Repeat ABG improving after being on BiPAP: 7.28, 43, at 180, 20.6.  PCCM consulted for ICU admission. ? ?Pertinent ED labs: LA 1.3, CO2 19, AG 16, glucose 159, BUN 84, creatinine 11.88, BNP 928, troponin 30, WBC 12.2, Hgb 9.5.   COVID flu pending.  ? ?Pertinent  Medical History  ? ?Past Medical History:  ?Diagnosis Date  ? Acquired lymphedema 12/13/2017  ? Anemia   ? CHF (congestive heart failure) ([place])   ? Chronic kidney disease   ? Chronic Kidney disease-stage unknown  ? Diverticulosis of large intestine without hemorrhage 01/31/2012  ? Essential HTN 04/21/2006  ? Excess skin of thigh   ? left thigh adipose excess  ? Hypertension   ? OSA 05/21/2013  ? does not wear CPAP any longer  ? 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 Wears dentures   ? ? ?Significant Hospital Events: ?Including procedures, antibiotic start and stop dates in addition to other pertinent events   ?4/7: Patient admitted to [facility] with respiratory failure started on BiPAP.  Likely CHF exacerbation with pulmonary edema and worsening kidney function. ? ?Interim History / Subjective:  ? ?Comfortable. He is hungry. Would like to eat.  ? ?Objective   ?Blood pressure (!) 171/93, pulse 78, temperature 98.2 ?F (36.8 ?C), temperature source Oral, resp. rate (!) 23, height '5\' 9"'$  (1.753 m), weight (!) 153.8 kg, SpO2 91 %. ?   ?   ? ?Intake/Output Summary (Last 24 hours) at 05/31/2021 0825 ?Last data filed at 05/31/2021 0600 ?Gross per 24 hour  ?Intake 370 ml  ?Output 2220 ml  ?Net -1850 ml  ? ?Filed Weights  ? 05/29/21 2345 05/30/21 0420  ?Weight: (!) 153.8 kg (!) 153.8 kg  ? ? ?Examination: ?General: Remains comfortable on nasal cannula ?HEENT: Mucous membranes dry ?Neuro: Alert oriented following commands ?CV: T tachycardic, regular, S1-S2 ?PULM: Diminished bilateral bases ?GI: Soft, nontender nondistended ?Extremities: Significant bilateral lower extremity edema ? ?Resolved Hospital Problem list   ? ? ?Assessment & Plan:  ? ?Acute respiratory distress with hypoxia/hypercarbia ?Pulmonary edema ?P: ?Improved ?Plans for HD today ? ?Possible CHF exacerbation: BNP 928 ?Hypertensive Emergency ?Hx of CHF: Echo 2009 LVEF 55-60% ?Hx of HTN ?P: ?Coreg ?  Amlodipine  ?Prn hydralazine  ? ?AKI on CKD 4: seen by nephrology outpatient last seen 2021 ?Anion gap acidosis ?P: ?Plans for HD today  ?Appreciate nephro assistance  ? ?Hx of Anemia ?Hx of diverticulosis: Seen by GI outpatient ?P: ?Follow cbc  ? ? ? ?Best Practice (right click and "Reselect all SmartList Selections" daily)  ? ?Diet/type: NPO w/ oral meds ?DVT prophylaxis: prophylactic heparin  ?GI prophylaxis: PPI ?Lines: N/A ?Foley:  N/A ?Code Status:  full code ?Last date of multidisciplinary goals of care discussion [4/7 spoke with  patient at bedside and updated on plan of care] ? ?Labs   ?CBC: ?Recent Labs  ?Lab 05/29/21 ?1919 05/29/21 ?1938 05/29/21 ?2029 05/30/21 ?4098 05/30/21 ?1191 05/31/21 ?4782 05/31/21 ?9562  ?WBC 12.2*  --   --  8.8  --  7.5 7.2  ?NEUTROABS 9.2*  --   --   --   --   --   --   ?HGB 9.5*   < > 9.9* 8.1* 8.5* 7.5* 7.6*  ?HCT 30.3*   < > 29.0* 25.0* 25.0* 23.2* 23.7*  ?MCV 95.0  --   --  92.3  --  91.0 91.5  ?PLT 274  --   --  227  --  212 214  ? < > = values in this interval not displayed.  ? ? ?Basic Metabolic Panel: ?Recent Labs  ?Lab 05/29/21 ?1919 05/29/21 ?1938 05/29/21 ?2027 05/29/21 ?2029 05/30/21 ?1308 05/30/21 ?6578 05/31/21 ?0138  ?NA 140   < > 139 139 140 140 140  ?K 4.5   < > 4.5 4.6 4.6 4.2 4.1  ?CL 105  --  109  --  103  --  105  ?CO2 19*  --   --   --  22  --  19*  ?GLUCOSE 159*  --  155*  --  109*  --  95  ?BUN 84*  --  92*  --  87*  --  95*  ?CREATININE 11.88*  --  13.60*  --  11.85*  --  12.21*  ?CALCIUM 7.8*  --   --   --  7.6*  --  7.7*  ?MG  --   --   --   --  2.0  --  2.0  ?PHOS  --   --   --   --  8.2*  --  8.0*  ? < > = values in this interval not displayed.  ? ?GFR: ?Estimated Creatinine Clearance: 9.6 mL/min (A) (by C-G formula based on SCr of 12.21 mg/dL (H)). ?Recent Labs  ?Lab 05/29/21 ?1919 05/30/21 ?4696 05/31/21 ?0138 05/31/21 ?2952  ?PROCALCITON  --  1.70  --   --   ?WBC 12.2* 8.8 7.5 7.2  ?LATICACIDVEN 1.3 1.2  --   --   ? ? ?Liver Function Tests: ?Recent Labs  ?Lab 05/29/21 ?1919 05/30/21 ?8413  ?AST 27  --   ?ALT 24  --   ?ALKPHOS 64  --   ?BILITOT 0.8  --   ?PROT 7.8  --   ?ALBUMIN 2.8* 2.4*  ? ?No results for input(s): LIPASE, AMYLASE in the last 168 hours. ?No results for input(s): AMMONIA in the last 168 hours. ? ?ABG ?   ?Component Value Date/Time  ? PHART 7.280 (L) 05/30/2021 0418  ? PCO2ART 47.5 05/30/2021 0418  ? PO2ART 105 05/30/2021 0418  ? HCO3 22.3 05/30/2021 0418  ? TCO2 24 05/30/2021 0418  ? ACIDBASEDEF 4.0 (H) 05/30/2021 0418  ? O2SAT 97 05/30/2021  0418  ?   ? ?Coagulation Profile: ?No results for input(s): INR, PROTIME in the last 168 hours. ? ?Cardiac Enzymes: ?No results for input(s): CKTOTAL, CKMB, CKMBINDEX, TROPONINI in the last 168 hours. ? ?HbA1C: ?Hemoglobin A1C  ?Date/Time Value Ref Range Status  ?09/12/2014 04:23 PM 5.5  Final  ?09/05/2013 11:05 AM 5.9  Final  ? ?Hgb A1c MFr Bld  ?Date/Time Value Ref Range Status  ?06/12/2019 01:27 AM 5.3 4.8 - 5.6 % Final  ?  Comment:  ?  (NOTE) ?Pre diabetes:          5.7%-6.4% ?Diabetes:              >6.4% ?Glycemic control for   <7.0% ?adults with diabetes ?  ? ? ?CBG: ?Recent Labs  ?Lab 05/29/21 ?2343 05/30/21 ?0357  ?GLUCAP 105* 107*  ? ? ?Review of Systems:   ?Unable to acquire too much information with bipap mask in place and patient became SOB with taking bipap off.  Obtained information from chart review and bedside nurse ? ?Past Medical History:  ?He,  has a past medical history of Acquired lymphedema (12/13/2017), Anemia, CHF (congestive heart failure) ([place]), Chronic kidney disease, Diverticulosis of large intestine without hemorrhage (01/31/2012), Essential HTN (04/21/2006), Excess skin of thigh, Hypertension, OSA (05/21/2013), PULMONARY EMBOLISM (11/12/2009), and Wears dentures.  ? ?Surgical History:  ? ?Past Surgical History:  ?Procedure Laterality Date  ? ABDOMINOPLASTY/PANNICULECTOMY WITH LIPOSUCTION Left 10/11/2018  ? Procedure: Excision of left thigh lipodystrophy;  Surgeon: Wallace Going, DO;  Location: [place];  Service: Plastics;  Laterality: Left;  ? COLONOSCOPY  01/31/2012  ? Procedure: COLONOSCOPY;  Surgeon: Inda Castle, MD;  Location: [facility];  Service: Endoscopy;  Laterality: N/A;  bmi is 66  ? COLONOSCOPY    ? COLONOSCOPY WITH PROPOFOL N/A 06/13/2019  ? Procedure: COLONOSCOPY WITH PROPOFOL;  Surgeon: Jackquline Denmark, MD;  Location: [facility] ENDOSCOPY;  Service: Endoscopy;  Laterality: N/A;  ? IR FLUORO GUIDE CV LINE RIGHT  05/30/2021  ? IR US GUIDE VASC ACCESS RIGHT  05/30/2021  ? MULTIPLE TOOTH  EXTRACTIONS    ? PANNICULECTOMY Bilateral 04/20/2018  ? Procedure: Excision of right inner thighs;  Surgeon: Wallace Going, DO;  Location: [place];  Service: Plastics;  Laterality: Bilateral;  Excision of right inner th

## 2021-05-31 NOTE — Progress Notes (Signed)
?  [facility] ?Progress Note  ? ?Assessment/ Plan:   ?1.  Acute hypoxic respiratory failure: CAP + Vol overload ?- on azithro/ CTX ?- some response to IV diuretics but not adequate ?- initating HD as below in #2 ? ?2.  Chronic kidney disease stage V:  ?- not a lot of response to high- dose Lasix ?- initating HD- NPO, TDC placed 05/30/21 (appreciate IR), HD #1 today 4/9, #2 4/10 ?- will need c/s VVS Monday for AVF ?- will need to be CLIP'd ? ?3.  Anemia:  ?- IV iron and ESA ? ?4.  Hypertension:  ?- on cleviprex gtt--> anticipate improvement once we get fluid off ? ?5.  Secondary hyperparathyroidism: Restart calcitriol for PTH control and begin phosphorus binder for hyperphosphatemia. ? ?Subjective:   ? ?Bumped from HD yesterday--> getting hooked up now to machine.  2L UOP yesterday  ? ?Objective:   ?BP (!) 153/74   Pulse 80   Temp 98.1 ?F (36.7 ?C) (Temporal)   Resp 14   Ht '5\' 9"'$  (1.753 m)   Wt (!) 154.5 kg   SpO2 92%   BMI 50.30 kg/m?  ? ?Physical Exam: ?[id number] up in bed ?CVS: RRR  ?Resp: bilateral crackles ?Abd: soft, + abd wall edema ?Ext: 3+ anasarca ? ?Labs: ?BMET ?Recent Labs  ?Lab 05/29/21 ?1919 05/29/21 ?1938 05/29/21 ?2027 05/29/21 ?2029 05/30/21 ?3149 05/30/21 ?7026 05/31/21 ?0138  ?NA 140 140 139 139 140 140 140  ?K 4.5 4.0 4.5 4.6 4.6 4.2 4.1  ?CL 105  --  109  --  103  --  105  ?CO2 19*  --   --   --  22  --  19*  ?GLUCOSE 159*  --  155*  --  109*  --  95  ?BUN 84*  --  92*  --  87*  --  95*  ?CREATININE 11.88*  --  13.60*  --  11.85*  --  12.21*  ?CALCIUM 7.8*  --   --   --  7.6*  --  7.7*  ?PHOS  --   --   --   --  8.2*  --  8.0*  ? ?CBC ?Recent Labs  ?Lab 05/29/21 ?1919 05/29/21 ?1938 05/30/21 ?3785 05/30/21 ?8850 05/31/21 ?2774 05/31/21 ?1287  ?WBC 12.2*  --  8.8  --  7.5 7.2  ?NEUTROABS 9.2*  --   --   --   --   --   ?HGB 9.5*   < > 8.1* 8.5* 7.5* 7.6*  ?HCT 30.3*   < > 25.0* 25.0* 23.2* 23.7*  ?MCV 95.0  --  92.3  --  91.0 91.5  ?PLT 274  --  227  --  212 214  ? < > =  values in this interval not displayed.  ? ? ?  ?Medications:   ? ? amLODipine  5 mg Oral Daily  ? calcitRIOL  1 mcg Oral Daily  ? carvedilol  12.5 mg Oral BID WC  ? chlorhexidine  15 mL Mouth Rinse BID  ? Chlorhexidine Gluconate Cloth  6 each Topical Daily  ? Chlorhexidine Gluconate Cloth  6 each Topical Q0600  ? heparin  5,000 Units Subcutaneous Q8H  ? lanthanum  500 mg Oral TID WC  ? mouth rinse  15 mL Mouth Rinse q12n4p  ? pantoprazole  40 mg Oral Daily  ? ? ? ?Madelon Lips MD ?05/31/2021, 10:54 AM   ?

## 2021-06-01 ENCOUNTER — Inpatient Hospital Stay (HOSPITAL_COMMUNITY): Payer: Medicaid Other

## 2021-06-01 ENCOUNTER — Encounter (HOSPITAL_COMMUNITY): Payer: Self-pay | Admitting: Pulmonary Disease

## 2021-06-01 ENCOUNTER — Other Ambulatory Visit: Payer: Self-pay

## 2021-06-01 DIAGNOSIS — N184 Chronic kidney disease, stage 4 (severe): Secondary | ICD-10-CM | POA: Diagnosis not present

## 2021-06-01 DIAGNOSIS — N185 Chronic kidney disease, stage 5: Secondary | ICD-10-CM | POA: Diagnosis not present

## 2021-06-01 DIAGNOSIS — I120 Hypertensive chronic kidney disease with stage 5 chronic kidney disease or end stage renal disease: Secondary | ICD-10-CM | POA: Diagnosis not present

## 2021-06-01 DIAGNOSIS — D631 Anemia in chronic kidney disease: Secondary | ICD-10-CM | POA: Diagnosis not present

## 2021-06-01 DIAGNOSIS — N179 Acute kidney failure, unspecified: Secondary | ICD-10-CM

## 2021-06-01 DIAGNOSIS — J9602 Acute respiratory failure with hypercapnia: Secondary | ICD-10-CM

## 2021-06-01 DIAGNOSIS — N189 Chronic kidney disease, unspecified: Secondary | ICD-10-CM | POA: Diagnosis not present

## 2021-06-01 DIAGNOSIS — J9601 Acute respiratory failure with hypoxia: Secondary | ICD-10-CM | POA: Diagnosis not present

## 2021-06-01 DIAGNOSIS — E8770 Fluid overload, unspecified: Secondary | ICD-10-CM | POA: Diagnosis present

## 2021-06-01 DIAGNOSIS — I10 Essential (primary) hypertension: Secondary | ICD-10-CM

## 2021-06-01 DIAGNOSIS — E8779 Other fluid overload: Secondary | ICD-10-CM | POA: Diagnosis not present

## 2021-06-01 DIAGNOSIS — J9691 Respiratory failure, unspecified with hypoxia: Secondary | ICD-10-CM | POA: Diagnosis not present

## 2021-06-01 DIAGNOSIS — N2581 Secondary hyperparathyroidism of renal origin: Secondary | ICD-10-CM | POA: Diagnosis not present

## 2021-06-01 LAB — CBC
HCT: 21.9 % — ABNORMAL LOW (ref 39.0–52.0)
Hemoglobin: 7.2 g/dL — ABNORMAL LOW (ref 13.0–17.0)
MCH: 29.6 pg (ref 26.0–34.0)
MCHC: 32.9 g/dL (ref 30.0–36.0)
MCV: 90.1 fL (ref 80.0–100.0)
Platelets: 199 10*3/uL (ref 150–400)
RBC: 2.43 MIL/uL — ABNORMAL LOW (ref 4.22–5.81)
RDW: 14.4 % (ref 11.5–15.5)
WBC: 7 10*3/uL (ref 4.0–10.5)
nRBC: 0 % (ref 0.0–0.2)

## 2021-06-01 LAB — BASIC METABOLIC PANEL
Anion gap: 12 (ref 5–15)
BUN: 74 mg/dL — ABNORMAL HIGH (ref 6–20)
CO2: 22 mmol/L (ref 22–32)
Calcium: 8 mg/dL — ABNORMAL LOW (ref 8.9–10.3)
Chloride: 104 mmol/L (ref 98–111)
Creatinine, Ser: 10.19 mg/dL — ABNORMAL HIGH (ref 0.61–1.24)
GFR, Estimated: 5 mL/min — ABNORMAL LOW (ref >60)
Glucose, Bld: 113 mg/dL — ABNORMAL HIGH (ref 70–99)
Potassium: 3.6 mmol/L (ref 3.5–5.1)
Sodium: 138 mmol/L (ref 135–145)

## 2021-06-01 LAB — GLUCOSE, CAPILLARY: Glucose-Capillary: 90 mg/dL (ref 70–99)

## 2021-06-01 LAB — MAGNESIUM: Magnesium: 1.9 mg/dL (ref 1.7–2.4)

## 2021-06-01 MED ORDER — FUROSEMIDE 40 MG PO TABS
80.0000 mg | ORAL_TABLET | Freq: Two times a day (BID) | ORAL | Status: DC
  Administered 2021-06-01 – 2021-06-04 (×7): 80 mg via ORAL
  Filled 2021-06-01 (×7): qty 2

## 2021-06-01 MED ORDER — ALTEPLASE 2 MG IJ SOLR
2.0000 mg | Freq: Once | INTRAMUSCULAR | Status: DC | PRN
  Filled 2021-06-01: qty 2

## 2021-06-01 MED ORDER — PNEUMOCOCCAL 20-VAL CONJ VACC 0.5 ML IM SUSY
0.5000 mL | PREFILLED_SYRINGE | INTRAMUSCULAR | Status: AC
  Administered 2021-06-02 (×2): 0.5 mL via INTRAMUSCULAR
  Filled 2021-06-01: qty 0.5

## 2021-06-01 MED ORDER — DARBEPOETIN ALFA 150 MCG/0.3ML IJ SOSY
150.0000 ug | PREFILLED_SYRINGE | INTRAMUSCULAR | Status: DC
  Administered 2021-06-02: 150 ug via INTRAVENOUS
  Filled 2021-06-01 (×2): qty 0.3

## 2021-06-01 MED ORDER — HEPARIN SODIUM (PORCINE) 1000 UNIT/ML DIALYSIS
1000.0000 [IU] | INTRAMUSCULAR | Status: DC | PRN
  Administered 2021-06-04: 1000 [IU] via INTRAVENOUS_CENTRAL
  Filled 2021-06-01 (×5): qty 1

## 2021-06-01 NOTE — Progress Notes (Signed)
?[facility] ?Progress Note  ? ?Assessment/ Plan:   ?1.  Acute hypoxic respiratory failure: CAP + Vol overload ?- on azithro/ CTX ?- some response to IV diuretics but not adequate ?- continuing HD as below ?- will continue some lasix to assist with volume removal ? ?2.  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 ?3.  Anemia:  ?- s/p iv iron, start aranesp 177mg weekly today ? ?4.  Hypertension:  ?- BP improving. Continue current meds and volume removal with HD and lasix ? ?5.  Secondary hyperparathyroidism: Restart calcitriol for PTH control and begin phosphorus binder for hyperphosphatemia. ? ?Subjective:   ? ?Got HD yesterday w/ 2L UF. UOP 1.3L. feeling better. O2 requirement decreased. Denies chest pain.  ? ?Objective:   ?BP (!) 166/78   Pulse 76   Temp 97.7 ?F (36.5 ?C) (Oral)   Resp 20   Ht '5\' 9"'$  (1.753 m)   Wt (!) 152.5 kg   SpO2 98%   BMI 49.65 kg/m?  ? ?Physical Exam: ?GQIH:KVQQVZDup in bed, nad ?CVS: normal rate, no rub ?Resp: bilateral crackles. Mild iwob ?Abd: soft, + abd wall edema, mild distention ?Ext: 3+ anasarca, warm and well perfused ?Psych: mood and affect appropriate ? ?Labs: ?BMET ?Recent Labs  ?Lab 05/29/21 ?1919 05/29/21 ?1938 05/29/21 ?2027 05/29/21 ?2029 05/30/21 ?0638704/08/23 ?0564304/09/23 ?0138 06/01/21 ?0244  ?NA 140 140 139 139 140 140 140 138  ?K 4.5 4.0 4.5 4.6 4.6 4.2 4.1 3.6  ?CL 105  --  109  --  103  --  105 104  ?CO2 19*  --   --   --  22  --  19* 22  ?GLUCOSE 159*  --  155*  --  109*  --  95 113*  ?BUN 84*  --  92*  --  87*  --  95* 74*  ?CREATININE 11.88*  --  13.60*  --  11.85*  --  12.21* 10.19*  ?CALCIUM 7.8*  --   --   --  7.6*  --  7.7* 8.0*  ?PHOS  --   --   --   --  8.2*  --  8.0*  --   ? ?CBC ?Recent Labs  ?Lab 05/29/21 ?1919 05/29/21 ?1938 05/30/21 ?03295 05/30/21 ?0188404/09/23 ?0138 05/31/21 ?0166004/10/23 ?0244  ?WBC 12.2*  --  8.8  --  7.5 7.2 7.0  ?NEUTROABS 9.2*  --   --   --   --   --   --   ?HGB 9.5*   < > 8.1* 8.5* 7.5* 7.6* 7.2*  ?HCT 30.3*   < > 25.0* 25.0* 23.2* 23.7* 21.9*  ?MCV 95.0  --  92.3  --  91.0 91.5 90.1  ?PLT 274  --  227  --  212 214 199  ? < > = values in this interval not displayed.  ? ? ?  ?Medications:   ? ? amLODipine  5 mg Oral Daily  ? calcitRIOL  1 mcg Oral Daily  ? carvedilol  12.5 mg Oral BID WC  ? chlorhexidine  15 mL Mouth Rinse BID  ? Chlorhexidine Gluconate Cloth  6 each Topical Q0600  ? heparin  5,000 Units Subcutaneous Q8H  ? lanthanum  500 mg Oral TID WC  ? mouth rinse  15 mL Mouth Rinse q12n4p  ? pantoprazole  40 mg Oral Daily  ? [START ON 06/02/2021] pneumococcal 20-valent conjugate vaccine  0.5 mL Intramuscular Tomorrow-1000  ? ? ? ?Reesa Chew ? ? ?06/01/2021, 9:27 AM   ?

## 2021-06-01 NOTE — Evaluation (Signed)
Physical Therapy Evaluation ?Patient Details ?Name: Johnny [name] ?MRN: [id number] ?DOB: Jan 26, 1961 ?Today's Date: 06/01/2021 ? ?History of Present Illness ? Pt adm 4/7 with acute hypoxic respiratory failure and volume overload secondary to progression of renal disease. Pt started on HD.  PMH - diverticulosis, HTN, CKD stage IV, obesity, OSA  ?Clinical Impression ? Pt presents to PT with decr mobility primarily due to bilateral foot pain. Hopefully with removal of fluid through HD foot pain will improve and mobility will begin to return to baseline. Pt very independent and motivated. Does not want to have to use assistive device.    ?   ? ?Recommendations for follow up therapy are one component of a multi-disciplinary discharge planning process, led by the attending physician.  Recommendations may be updated based on patient status, additional functional criteria and insurance authorization. ? ?Follow Up Recommendations No PT follow up ? ?  ?Assistance Recommended at Discharge Intermittent Supervision/Assistance  ?Patient can return home with the following ? Help with stairs or ramp for entrance ? ?  ?Equipment Recommendations None recommended by PT (Pt declines use of assistive device)  ?Recommendations for Other Services ?    ?  ?Functional Status Assessment Patient has had a recent decline in their functional status and demonstrates the ability to make significant improvements in function in a reasonable and predictable amount of time.  ? ?  ?Precautions / Restrictions Precautions ?Precautions: Fall  ? ?  ? ?Mobility ? Bed Mobility ?Overal bed mobility: Needs Assistance ?Bed Mobility: Supine to Sit ?  ?  ?Supine to sit: Supervision, HOB elevated ?  ?  ?General bed mobility comments: supervision for safety and lines ?  ? ?Transfers ?Overall transfer level: Needs assistance ?Equipment used: None ?Transfers: Sit to/from Stand ?Sit to Stand: Min guard ?  ?  ?  ?  ?  ?General transfer comment: Assist for safety and lines  but no physical hands on ?  ? ?Ambulation/Gait ?Ambulation/Gait assistance: Min guard ?Gait Distance (Feet): 8 Feet (x 2) ?Assistive device: None, Rolling walker (2 wheels) ?Gait Pattern/deviations: Step-through pattern, Decreased step length - right, Decreased step length - left, Antalgic, Wide base of support ?Gait velocity: decr ?Gait velocity interpretation: <1.31 ft/sec, indicative of household ambulator ?  ?General Gait Details: Used rolling walker for a few steps but pt preferred to not use so completed amb without it. Assist for safety and lines. When not using walker pt reaching for UE support due to painful bilateral feet ? ?Stairs ?  ?  ?  ?  ?  ? ?Wheelchair Mobility ?  ? ?Modified Rankin (Stroke Patients Only) ?  ? ?  ? ?Balance Overall balance assessment: Mild deficits observed, not formally tested ?  ?  ?  ?  ?  ?  ?  ?  ?  ?  ?  ?  ?  ?  ?  ?  ?  ?  ?   ? ? ? ?Pertinent Vitals/Pain Pain Assessment ?Pain Assessment: 0-10 ?Pain Score: 8  ?Pain Location: feet ?Pain Descriptors / Indicators: Sore, Pins and needles ?Pain Intervention(s): Limited activity within patient's tolerance  ? ? ?Home Living Family/patient expects to be discharged to:: Private residence ?Living Arrangements: Alone ?Available Help at Discharge: Family;Available PRN/intermittently ?Type of Home: House ?Home Access: Stairs to enter ?Entrance Stairs-Rails: Right;Left;Can reach both ?Entrance Stairs-Number of Steps: 4 ?  ?Home Layout: One level ?Home Equipment: None ?   ?  ?Prior Function Prior Level of Function : Independent/Modified Independent ?  ?  ?  ?  ?  ?  ?  Mobility Comments: Doesn't use assistive device ?  ?  ? ? ?Hand Dominance  ? Dominant Hand: Right ? ?  ?Extremity/Trunk Assessment  ? Upper Extremity Assessment ?Upper Extremity Assessment: Defer to OT evaluation ?  ? ?Lower Extremity Assessment ?Lower Extremity Assessment: Generalized weakness ?  ? ?   ?Communication  ? Communication: No difficulties  ?Cognition  Arousal/Alertness: Awake/alert ?Behavior During Therapy: [facility] for tasks assessed/performed ?Overall Cognitive Status: Within Functional Limits for tasks assessed ?  ?  ?  ?  ?  ?  ?  ?  ?  ?  ?  ?  ?  ?  ?  ?  ?  ?  ?  ? ?  ?General Comments General comments (skin integrity, edema, etc.): VSS on RA with SpO2 >90% on RA. Left O2 off. ? ?  ?Exercises    ? ?Assessment/Plan  ?  ?PT Assessment Patient needs continued PT services  ?PT Problem List Decreased activity tolerance;Decreased mobility;Pain;Obesity ? ?   ?  ?PT Treatment Interventions DME instruction;Gait training;Stair training;Functional mobility training;Therapeutic exercise;Therapeutic activities;Patient/family education   ? ?PT Goals (Current goals can be found in the Care Plan section)  ?Acute Rehab PT Goals ?Patient Stated Goal: feet to stop hurting and return home ?PT Goal Formulation: With patient ?Time For Goal Achievement: 06/15/21 ?Potential to Achieve Goals: Good ? ?  ?Frequency Min 3X/week ?  ? ? ?Co-evaluation   ?  ?  ?  ?  ? ? ?  ?AM-PAC PT "6 Clicks" Mobility  ?Outcome Measure Help needed turning from your back to your side while in a flat bed without using bedrails?: None ?Help needed moving from lying on your back to sitting on the side of a flat bed without using bedrails?: A Little ?Help needed moving to and from a bed to a chair (including a wheelchair)?: A Little ?Help needed standing up from a chair using your arms (e.g., wheelchair or bedside chair)?: A Little ?Help needed to walk in hospital room?: Total ?Help needed climbing 3-5 steps with a railing? : Total ?6 Click Score: 15 ? ?  ?End of Session   ?Activity Tolerance: Patient limited by pain ?Patient left: in chair;with call bell/phone within reach ?Nurse Communication: Mobility status;Other (comment) (O2 off) ?PT Visit Diagnosis: Other abnormalities of gait and mobility (R26.89);Pain ?Pain - Right/Left:  (bilateral) ?Pain - part of body: Ankle and joints of foot ?  ? ?Time: 1236-1304  (7-8 minutes in bathroom) ?PT Time Calculation (min) (ACUTE ONLY): 28 min ? ? ?Charges:   PT Evaluation ?$PT Eval Moderate Complexity: 1 Mod ?  ?  ?   ? ? ?[facility] PT ?Acute Rehabilitation Services ?Office [phone] ? ? ?Shary Decamp [facility] ?06/01/2021, 3:10 PM ? ?

## 2021-06-01 NOTE — Progress Notes (Signed)
RT went to see if pt wanted to wear cpap tonight, pt not in room at this time.  ?

## 2021-06-01 NOTE — Assessment & Plan Note (Addendum)
Initially required Cleviprex infusion in the ICU.   ?Blood pressure improved.  Transitioned to oral agents. ?Noted to be on amlodipine, carvedilol and furosemide. ?Echocardiogram shows normal systolic function. ?Occasional high readings noted.  Will not be too aggressive since he is now on dialysis. ?

## 2021-06-01 NOTE — Progress Notes (Signed)
Clinical Key education printed, reviewed and given to patient on heart failure, ESRD/Dialysis, daily weights, signs and symptoms of fluid overload, catheter site infection, heart/renal diet, and sleep apnea. ?Erling Conte, RN   ?

## 2021-06-01 NOTE — Assessment & Plan Note (Signed)
Estimated body mass index is 49.65 kg/m? as calculated from the following: ?  Height as of this encounter: '5\' 9"'$  (1.753 m). ?  Weight as of this encounter: 152.5 kg. ? ?

## 2021-06-01 NOTE — Consult Note (Addendum)
?Hospital Consult ? ? ? ?Reason for Consult:  permanent access ?Requesting Physician:  Dr. Joylene Grapes ?MRN #:  [id number] ? ?History of Present Illness: This is a 61 y.o. male who was admitted with acute hypoxic respiratory failure secondary to CAP and volume overload. He has history of CKD stage V now felt to be progressed to ESRD. He has had a right IJ TDC placed by IR. He has initiated dialysis on 4/9 and will receive further treatments today and also tomorrow 4/11. Vascular surgery has been consulted to evaluate patient for permanent hemodialysis access. ? ?Patient has previously been seen by Dr. Scot Dock in 2019. At the time left upper extremity access was discussed with patient based on his non invasive studies. At the time he was CKD stage IV and did not want to schedule surgery. He is right hand dominant. He has no upper extremity limitations. He denies any prior access, PICC lines, Central lines or pacemaker. He presently denies any nausea, vomiting, fatigue, anorexia, palpitations, or shortness of breath ? ?Past Medical History:  ?Diagnosis Date  ? Acquired lymphedema 12/13/2017  ? Anemia   ? CHF (congestive heart failure) ([place])   ? Chronic kidney disease   ? Chronic Kidney disease-stage unknown  ? Diverticulosis of large intestine without hemorrhage 01/31/2012  ? Essential HTN 04/21/2006  ? Excess skin of thigh   ? left thigh adipose excess  ? Hypertension   ? OSA 05/21/2013  ? does not wear CPAP any longer  ? 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 Wears dentures   ? ? ?Past Surgical History:  ?Procedure Laterality Date  ? ABDOMINOPLASTY/PANNICULECTOMY WITH LIPOSUCTION Left 10/11/2018  ? Procedure: Excision of left thigh lipodystrophy;  Surgeon: Wallace Going, DO;  Location: [facility];  Service: Plastics;  Laterality: Left;  ? COLONOSCOPY  01/31/2012  ? Procedure: COLONOSCOPY;  Surgeon: Inda Castle, MD;  Location: [facility];  Service: Endoscopy;  Laterality: N/A;  bmi is 64  ? COLONOSCOPY    ? COLONOSCOPY WITH  PROPOFOL N/A 06/13/2019  ? Procedure: COLONOSCOPY WITH PROPOFOL;  Surgeon: Jackquline Denmark, MD;  Location: [facility];  Service: Endoscopy;  Laterality: N/A;  ? IR FLUORO GUIDE CV LINE RIGHT  05/30/2021  ? IR US GUIDE VASC ACCESS RIGHT  05/30/2021  ? MULTIPLE TOOTH EXTRACTIONS    ? PANNICULECTOMY Bilateral 04/20/2018  ? Procedure: Excision of right inner thighs;  Surgeon: Wallace Going, DO;  Location: [place];  Service: Plastics;  Laterality: Bilateral;  Excision of right inner thighs  ? UPPER GASTROINTESTINAL ENDOSCOPY  06/05/2020  ? Lyndel Safe  ? ? ?Allergies  ?Allergen Reactions  ? Lisinopril Swelling and Other (See Comments)  ?  Angioedema  ? ? ?Prior to Admission medications   ?Medication Sig Start Date End Date Taking? Authorizing Provider  ?amLODipine (NORVASC) 5 MG tablet Take 5 mg by mouth daily. 03/29/21  Yes [provider]  ?ascorbic acid (VITAMIN C) 500 MG tablet Take 1 tablet (500 mg total) by mouth daily. 03/13/19  Yes Sheikh, Omair Latif, DO  ?carvedilol (COREG) 25 MG tablet Take 1 tablet (25 mg total) by mouth 2 (two) times daily with a meal. You are overdue for an appointment. You must schedule an appointment for additional refills. 04/29/21  Yes Sharion Settler, DO  ?cholecalciferol (VITAMIN D3) 25 MCG (1000 UNIT) tablet Take 1,000 Units by mouth daily.   Yes [provider]  ?furosemide (LASIX) 40 MG tablet Take 1 tablet (40 mg total) by mouth daily. 10/22/15  Yes McMullen,  Shanon Brow, DO  ?zinc sulfate 220 (50 Zn) MG capsule Take 1 capsule (220 mg total) by mouth daily. 03/13/19  Yes Sheikh, Omair Latif, DO  ?albuterol (VENTOLIN HFA) 108 (90 Base) MCG/ACT inhaler Inhale 1-2 puffs into the lungs every 4 (four) hours as needed for wheezing or shortness of breath. ?Patient not taking: Reported on 05/30/2021 03/12/19   Kerney Elbe, DO  ? ? ?Social History  ? ?Socioeconomic History  ? Marital status: Single  ?  Spouse name: Not on file  ? Number of children: 1  ? Years of education: Not on  file  ? Highest education level: Not on file  ?Occupational History  ? Occupation: disabled  ?  Employer: UNEMPLOYED  ?Tobacco Use  ? Smoking status: Never  ? Smokeless tobacco: Never  ?Vaping Use  ? Vaping Use: Never used  ?Substance and Sexual Activity  ? Alcohol use: Yes  ?  Comment: social-once a week  ? Drug use: No  ?  Comment: stopped marijuana about Sep or Oct 2013  ? Sexual activity: Not on file  ?Other Topics Concern  ? Not on file  ?Social History Narrative  ? Not on file  ? ?Social Determinants of Health  ? ?Financial Resource Strain: Not on file  ?Food Insecurity: Not on file  ?Transportation Needs: Not on file  ?Physical Activity: Not on file  ?Stress: Not on file  ?Social Connections: Not on file  ?Intimate Partner Violence: Not on file  ? ? ?Family History  ?Problem Relation Age of Onset  ? Breast cancer Mother   ? Hypertension Mother   ? Colon cancer Father   ?     died at 85  ? Hypertension Brother   ? Esophageal cancer Neg Hx   ? Stomach cancer Neg Hx   ? Liver disease Neg Hx   ? Rectal cancer Neg Hx   ? ? ?ROS: Otherwise negative unless mentioned in HPI ? ?Physical Examination ? ?Vitals:  ? 06/01/21 0800 06/01/21 0900  ?BP: (!) 182/102 (!) 166/78  ?Pulse: 76 76  ?Resp: (!) 30 20  ?Temp: 97.7 ?F (36.5 ?C)   ?SpO2: 97% 98%  ? ?Body mass index is 49.65 kg/m?. ? ?General:  WDWN in NAD ?Gait: Not observed ?HENT: WNL, normocephalic ?Pulmonary: normal non-labored breathing ?Cardiac: regular rate and rhythm; Right IJ TDC in place ?Abdomen: soft, non distended ?Vascular Exam/Pulses: 2+ radial pulses bilaterally, 5/5 grip strength bilaterally ?Musculoskeletal: no muscle wasting or atrophy  ?Neurologic: A&O X 3;  No focal weakness or paresthesias are detected; speech is fluent/normal ?Psychiatric:  The pt has Normal affect. ? ? ?CBC ?   ?Component Value Date/Time  ? WBC 7.0 06/01/2021 0244  ? RBC 2.43 (L) 06/01/2021 0244  ? HGB 7.2 (L) 06/01/2021 0244  ? HGB 9.7 (L) 12/31/2019 1024  ? HCT 21.9 (L)  06/01/2021 0244  ? HCT 30.9 (L) 12/31/2019 1024  ? PLT 199 06/01/2021 0244  ? PLT 249 12/31/2019 1024  ? MCV 90.1 06/01/2021 0244  ? MCV 81 12/31/2019 1024  ? MCH 29.6 06/01/2021 0244  ? MCHC 32.9 06/01/2021 0244  ? RDW 14.4 06/01/2021 0244  ? RDW 17.8 (H) 12/31/2019 1024  ? LYMPHSABS 1.9 05/29/2021 1919  ? MONOABS 0.8 05/29/2021 1919  ? EOSABS 0.2 05/29/2021 1919  ? BASOSABS 0.0 05/29/2021 1919  ? ? ?BMET ?   ?Component Value Date/Time  ? NA 138 06/01/2021 0244  ? NA 138 12/31/2019 1024  ? K 3.6 06/01/2021 0244  ?  CL 104 06/01/2021 0244  ? CO2 22 06/01/2021 0244  ? GLUCOSE 113 (H) 06/01/2021 0244  ? BUN 74 (H) 06/01/2021 0244  ? BUN 60 (H) 12/31/2019 1024  ? CREATININE 10.19 (H) 06/01/2021 0244  ? CREATININE 2.94 (H) 09/04/2015 1035  ? CALCIUM 8.0 (L) 06/01/2021 0244  ? GFRNONAA 5 (L) 06/01/2021 0244  ? GFRNONAA 23 (L) 09/04/2015 1035  ? GFRAA 18 (L) 12/31/2019 1024  ? GFRAA 27 (L) 09/04/2015 1035  ? ? ?COAGS: ?Lab Results  ?Component Value Date  ? INR 1.2 06/12/2019  ? INR 2.2 12/28/2010  ? INR 2.2 11/30/2010  ? ? ? ?Non-Invasive Vascular Imaging:   ?Bilateral upper extremity vein mapping pending ? ?Statin:  No. ?Beta Blocker:  Yes ?Aspirin:  No. ?ACEI:  No. ?ARB:  No. ?CCB use:  Yes ?Other antiplatelets/anticoagulants:  No.  ? ? ?ASSESSMENT/PLAN: This is a 61 y.o. male who presented with Acute hypoxic respiratory failure secondary to CAP and volume overload. He is CKD stage V now progressed to ESRD. He presently is being dialyzed via a right IJ TDC placed by IR. He is in need of permanent Hemodialysis access. I have discussed Risk, benefits, and alternatives to access surgery. I have placed order for bilateral upper extremity vein mapping, which is pending. Patient is right hand dominant, so plan with be likely for left upper extremity AVF vs AVG. The vascular surgeon on call, Dr. Carlis Abbott will see patient later this afternoon/ once vein mapping obtained to discuss further surgical planning with the  patient. ? ? ? ?Corrina Baglia PA-C ?[facility] ?[phone] ?06/01/2021  ?9:48 AM ? ?I have seen and evaluated the patient. I agree with the PA note as documented above.  61 year old male admitted with acut

## 2021-06-01 NOTE — Assessment & Plan Note (Addendum)
Hemoglobin is low.  Started on Aranesp by nephrology.  No evidence of overt blood loss. ?

## 2021-06-01 NOTE — Assessment & Plan Note (Signed)
Not compliant with CPAP 

## 2021-06-01 NOTE — Assessment & Plan Note (Addendum)
Secondary to progression of renal disease.  Now being dialyzed.  Volume status has improved.  Still has edema in his lower extremities and scrotum however. ?

## 2021-06-01 NOTE — Assessment & Plan Note (Addendum)
Nephrology was consulted.  Patient started on dialysis.   ?Vascular surgery was consulted for access.  Underwent AV fistula creation on 4/12 ?Has been established at outpatient dialysis center starting tomorrow. ?Discussed with nephrology.  Okay for discharge today after hemodialysis ?He has a Foley catheter which was placed for strict ins and outs.  Plan was to discontinue the Foley catheter prior to discharge.  However patient continues to have significant scrotal edema.  His penis is barely visible due to this significant edema.  Discussed with Dr. Milford Cage with urology who recommends leaving the catheter in for now and do a voiding trial once the swelling has subsided.  He will be changed over to a leg bag.  Follow-up with urology in 3 weeks.  Unit secretary to make the appointment ?Lasix to continue. ?

## 2021-06-01 NOTE — Progress Notes (Signed)
Upper extremity vein mapping completed. ?Refer to "CV Proc" under chart review to view preliminary results. ? ?06/01/2021 11:10 AM ?Kelby Aline., MHA, RVT, RDCS, RDMS   ?

## 2021-06-01 NOTE — Hospital Course (Addendum)
61 year old male with pertinent PMH of OSA (does not wear CPAP), HTN, CHF (2009 Echo EF 55-60%), CKD 4 followed by nephrology presented to [facility] ED on 4/7 with hypoxic respiratory failure.  Patient had experienced a 30 pound weight gain in the past few weeks prior to admission.  He was noted to have worsening renal function.  Thought to be fluid overloaded.  Noted to have hypertensive emergency.  Admitted initially to the intensive care unit.  Required Cleviprex infusion.  Blood pressure was stabilized.  Seen by nephrology and started on dialysis.  Stabilized and then transferred to hospitalist service.  Requiring intermittent hemodialysis.  Underwent AV fistula creation by vascular surgery.  Has been established at outpatient dialysis center. ?

## 2021-06-01 NOTE — Progress Notes (Signed)
Requested to see pt for out-pt HD needs at d/c. Spoke to pt via phone. Introduced self and explained role. Pt prefers a clinic close to pt's home that has a MWF schedule available. Referral made to [facility] admissions today. Pt states he plans to drive himself to HD appts. Will assist as needed.  ? ?Johnny [name] ?Renal Navigator ?[phone] ?

## 2021-06-01 NOTE — Assessment & Plan Note (Signed)
See under acute kidney injury. ?

## 2021-06-01 NOTE — Progress Notes (Signed)
? ?[facility] ?PROGRESS NOTE ? ? ?Johnny [name] [id number]:[id number] DOB: January 05, 1961 DOA: 05/29/2021  3 ?DOS: the patient was seen and examined on 06/01/2021 ? ?PCP: Pcp, No ? ?Brief History and Hospital Course:  ?61 year old male with pertinent PMH of OSA (does not wear CPAP), HTN, CHF (2009 Echo EF 55-60%), CKD 4 followed by nephrology presented to [facility] ED on 4/7 with hypoxic respiratory failure.  Patient had experienced a 30 pound weight gain in the past few weeks prior to admission.  He was noted to have worsening renal function.  Thought to be fluid overloaded.  Noted to have hypertensive emergency.  Admitted initially to the intensive care unit.  Required Cleviprex infusion.  Blood pressure was stabilized.  Seen by nephrology and started on dialysis.  Stabilized and then transferred to hospitalist service.   ? ?Consultants: Nephrology, interventional radiology ? ?Procedures: Tunneled HD catheter placement in the right internal jugular, 4/8 ? ? ? ?Subjective: ?Patient mentions that he is feeling better.  Shortness of breath is improved.  Denies any chest pain.  No nausea or vomiting. ? ? ? ?Assessment/Plan: ? ?* Acute respiratory failure with hypoxia and hypercapnia (HCC) ?Patient was initially admitted to the intensive care unit.  Hypoxia was thought to be secondary to volume overload.  Patient was started on dialysis with improvement. ?Currently on 2 L of oxygen by nasal cannula saturations in the mid 90s. ? ?Volume overload ?Secondary to progression of renal disease.  Now being dialyzed. ? ?CKD (chronic kidney disease) stage 4, GFR 15-29 ml/min (HCC) ?See under acute kidney injury. ? ?AKI (acute kidney injury) ([place]) ?Has progression of kidney disease.  Creatinine noted to be significantly elevated.  Nephrology is following.  Plan is for dialysis. ?He is making urine at this time. ?Lasix being continued as well. ? ?Essential hypertension ?Initially required Cleviprex infusion in the ICU.  Blood pressure now  improved.  Noted to be on amlodipine, carvedilol and furosemide.  Monitor blood pressures closely.  Adjust her dosage of medications as indicated. ?Echocardiogram shows normal systolic function. ? ?History of anemia due to chronic kidney disease ?Hemoglobin is low.  Started on iron asked by nephrology.  No evidence of overt blood loss. ? ?Sleep apnea ?Not compliant with CPAP. ? ?Morbid obesity ([place]) ?Estimated body mass index is 49.65 kg/m? as calculated from the following: ?  Height as of this encounter: '5\' 9"'$  (1.753 m). ?  Weight as of this encounter: 152.5 kg. ? ? ? ?DVT Prophylaxis: Subcutaneous heparin ?Code Status: Full code ?Family Communication: Discussed with patient ?Disposition Plan: Hopefully return home when improved.  He lives with his parents. ? ?Status is: Inpatient ?Remains inpatient appropriate because: Progression of kidney disease, requiring dialysis ? ? ? ? ?Medications: Scheduled: ? amLODipine  5 mg Oral Daily  ? calcitRIOL  1 mcg Oral Daily  ? carvedilol  12.5 mg Oral BID WC  ? chlorhexidine  15 mL Mouth Rinse BID  ? Chlorhexidine Gluconate Cloth  6 each Topical Q0600  ? darbepoetin (ARANESP) injection - DIALYSIS  150 mcg Intravenous Q Mon-HD  ? furosemide  80 mg Oral BID  ? heparin  5,000 Units Subcutaneous Q8H  ? lanthanum  500 mg Oral TID WC  ? mouth rinse  15 mL Mouth Rinse q12n4p  ? pantoprazole  40 mg Oral Daily  ? [START ON 06/02/2021] pneumococcal 20-valent conjugate vaccine  0.5 mL Intramuscular Tomorrow-1000  ? ?Continuous: ? sodium chloride    ? sodium chloride    ? ?IOX:BDZHGD chloride,  sodium chloride, acetaminophen, docusate sodium, heparin, hydrALAZINE, ipratropium-albuterol, lidocaine (PF), lidocaine-prilocaine, pentafluoroprop-tetrafluoroeth, polyethylene glycol ? ?Antibiotics: ?Anti-infectives (From admission, onward)  ? ? Start     Dose/Rate Route Frequency Ordered Stop  ? 05/30/21 1200  ceFAZolin (ANCEF) IVPB 2g/100 mL premix  Status:  Discontinued       ? over 30 Minutes  Intravenous Continuous PRN 05/30/21 1211 05/31/21 1430  ? 05/29/21 2200  cefTRIAXone (ROCEPHIN) 2 g in sodium chloride 0.9 % 100 mL IVPB  Status:  Discontinued       ? 2 g ?200 mL/hr over 30 Minutes Intravenous Every 24 hours 05/29/21 2158 05/30/21 0835  ? 05/29/21 2200  azithromycin (ZITHROMAX) 500 mg in sodium chloride 0.9 % 250 mL IVPB  Status:  Discontinued       ? 500 mg ?250 mL/hr over 60 Minutes Intravenous Every 24 hours 05/29/21 2158 05/30/21 0835  ? ?  ? ? ?Objective: ? ?Vital Signs ? ?Vitals:  ? 06/01/21 0600 06/01/21 0700 06/01/21 0800 06/01/21 0900  ?BP: (!) 160/98 (!) 153/81 (!) 182/102 (!) 166/78  ?Pulse: 64 70 76 76  ?Resp: 20 19 (!) 30 20  ?Temp:   97.7 ?F (36.5 ?C)   ?TempSrc:   Oral   ?SpO2: 96% 98% 97% 98%  ?Weight:      ?Height:      ? ? ?Intake/Output Summary (Last 24 hours) at 06/01/2021 1000 ?Last data filed at 06/01/2021 0900 ?Gross per 24 hour  ?Intake 1350 ml  ?Output 3440 ml  ?Net -2090 ml  ? ?Filed Weights  ? 05/30/21 0420 05/31/21 0940 05/31/21 1225  ?Weight: (!) 153.8 kg (!) 154.5 kg (!) 152.5 kg  ? ? ?General appearance: Awake alert.  In no distress ?Resp: Diminished air entry at the bases.  Normal effort at rest.  Few crackles.  No wheezing or rhonchi. ?Cardio: S1-S2 is normal regular.  No S3-S4.  No rubs murmurs or bruit ?GI: Abdomen is soft.  Nontender nondistended.  Bowel sounds are present normal.  No masses organomegaly ?Extremities: 2+ edema bilateral lower extremities ?Neurologic: Alert and oriented x3.  No focal neurological deficits.  ? ? ?Lab Results: ? ?Data Reviewed: I have personally reviewed labs and imaging study reports ? ?CBC: ?Recent Labs  ?Lab 05/29/21 ?1919 05/29/21 ?1938 05/30/21 ?9892 05/30/21 ?1194 05/31/21 ?0138 05/31/21 ?1740 06/01/21 ?0244  ?WBC 12.2*  --  8.8  --  7.5 7.2 7.0  ?NEUTROABS 9.2*  --   --   --   --   --   --   ?HGB 9.5*   < > 8.1* 8.5* 7.5* 7.6* 7.2*  ?HCT 30.3*   < > 25.0* 25.0* 23.2* 23.7* 21.9*  ?MCV 95.0  --  92.3  --  91.0 91.5 90.1  ?PLT  274  --  227  --  212 214 199  ? < > = values in this interval not displayed.  ? ? ?Basic Metabolic Panel: ?Recent Labs  ?Lab 05/29/21 ?1919 05/29/21 ?1938 05/29/21 ?2027 05/29/21 ?2029 05/30/21 ?8144 05/30/21 ?8185 05/31/21 ?0138 06/01/21 ?0244  ?NA 140   < > 139 139 140 140 140 138  ?K 4.5   < > 4.5 4.6 4.6 4.2 4.1 3.6  ?CL 105  --  109  --  103  --  105 104  ?CO2 19*  --   --   --  22  --  19* 22  ?GLUCOSE 159*  --  155*  --  109*  --  95 113*  ?  BUN 84*  --  92*  --  87*  --  95* 74*  ?CREATININE 11.88*  --  13.60*  --  11.85*  --  12.21* 10.19*  ?CALCIUM 7.8*  --   --   --  7.6*  --  7.7* 8.0*  ?MG  --   --   --   --  2.0  --  2.0 1.9  ?PHOS  --   --   --   --  8.2*  --  8.0*  --   ? < > = values in this interval not displayed.  ? ? ?GFR: ?Estimated Creatinine Clearance: 11.4 mL/min (A) (by C-G formula based on SCr of 10.19 mg/dL (H)). ? ?Liver Function Tests: ?Recent Labs  ?Lab 05/29/21 ?1919 05/30/21 ?7341  ?AST 27  --   ?ALT 24  --   ?ALKPHOS 64  --   ?BILITOT 0.8  --   ?PROT 7.8  --   ?ALBUMIN 2.8* 2.4*  ? ? ? ?CBG: ?Recent Labs  ?Lab 05/29/21 ?9379 05/30/21 ?0240 06/01/21 ?0759  ?GLUCAP 105* 107* 90  ? ? ?Recent Results (from the past 240 hour(s))  ?Resp Panel by RT-PCR (Flu A&B, Covid) Nasopharyngeal Swab     Status: None  ? Collection Time: 05/29/21  8:06 PM  ? Specimen: Nasopharyngeal Swab; Nasopharyngeal(NP) swabs in vial transport medium  ?Result Value Ref Range Status  ? SARS Coronavirus 2 by RT PCR NEGATIVE NEGATIVE Final  ?  Comment: (NOTE) ?SARS-CoV-2 target nucleic acids are NOT DETECTED. ? ?The SARS-CoV-2 RNA is generally detectable in upper respiratory ?specimens during the acute phase of infection. The lowest ?concentration of SARS-CoV-2 viral copies this assay can detect is ?138 copies/mL. A negative result does not preclude SARS-Cov-2 ?infection and should not be used as the sole basis for treatment or ?other patient management decisions. A negative result may occur with  ?improper specimen  collection/handling, submission of specimen other ?than nasopharyngeal swab, presence of viral mutation(s) within the ?areas targeted by this assay, and inadequate number of viral ?copies(<138 copies/mL). A ne

## 2021-06-01 NOTE — Assessment & Plan Note (Addendum)
Patient was initially admitted to the intensive care unit.  Hypoxia was thought to be secondary to volume overload.  Patient was started on dialysis with improvement. ?Has been weaned off of oxygen.  Saturating in the 90s on room air. ?

## 2021-06-02 DIAGNOSIS — J9601 Acute respiratory failure with hypoxia: Secondary | ICD-10-CM | POA: Diagnosis not present

## 2021-06-02 DIAGNOSIS — I120 Hypertensive chronic kidney disease with stage 5 chronic kidney disease or end stage renal disease: Secondary | ICD-10-CM | POA: Diagnosis not present

## 2021-06-02 DIAGNOSIS — N2581 Secondary hyperparathyroidism of renal origin: Secondary | ICD-10-CM | POA: Diagnosis not present

## 2021-06-02 DIAGNOSIS — N179 Acute kidney failure, unspecified: Secondary | ICD-10-CM | POA: Diagnosis not present

## 2021-06-02 DIAGNOSIS — Z862 Personal history of diseases of the blood and blood-forming organs and certain disorders involving the immune mechanism: Secondary | ICD-10-CM | POA: Diagnosis not present

## 2021-06-02 DIAGNOSIS — N184 Chronic kidney disease, stage 4 (severe): Secondary | ICD-10-CM | POA: Diagnosis not present

## 2021-06-02 DIAGNOSIS — D631 Anemia in chronic kidney disease: Secondary | ICD-10-CM | POA: Diagnosis not present

## 2021-06-02 DIAGNOSIS — I10 Essential (primary) hypertension: Secondary | ICD-10-CM | POA: Diagnosis not present

## 2021-06-02 DIAGNOSIS — J9691 Respiratory failure, unspecified with hypoxia: Secondary | ICD-10-CM | POA: Diagnosis not present

## 2021-06-02 DIAGNOSIS — N185 Chronic kidney disease, stage 5: Secondary | ICD-10-CM | POA: Diagnosis not present

## 2021-06-02 DIAGNOSIS — E8779 Other fluid overload: Secondary | ICD-10-CM | POA: Diagnosis not present

## 2021-06-02 DIAGNOSIS — N189 Chronic kidney disease, unspecified: Secondary | ICD-10-CM | POA: Diagnosis not present

## 2021-06-02 DIAGNOSIS — J9602 Acute respiratory failure with hypercapnia: Secondary | ICD-10-CM | POA: Diagnosis not present

## 2021-06-02 LAB — RENAL FUNCTION PANEL
Albumin: 2.2 g/dL — ABNORMAL LOW (ref 3.5–5.0)
Anion gap: 12 (ref 5–15)
BUN: 50 mg/dL — ABNORMAL HIGH (ref 6–20)
CO2: 23 mmol/L (ref 22–32)
Calcium: 8.1 mg/dL — ABNORMAL LOW (ref 8.9–10.3)
Chloride: 104 mmol/L (ref 98–111)
Creatinine, Ser: 7.59 mg/dL — ABNORMAL HIGH (ref 0.61–1.24)
GFR, Estimated: 8 mL/min — ABNORMAL LOW (ref >60)
Glucose, Bld: 101 mg/dL — ABNORMAL HIGH (ref 70–99)
Phosphorus: 4.3 mg/dL (ref 2.5–4.6)
Potassium: 3 mmol/L — ABNORMAL LOW (ref 3.5–5.1)
Sodium: 139 mmol/L (ref 135–145)

## 2021-06-02 LAB — BASIC METABOLIC PANEL
Anion gap: 9 (ref 5–15)
BUN: 50 mg/dL — ABNORMAL HIGH (ref 6–20)
CO2: 24 mmol/L (ref 22–32)
Calcium: 8.1 mg/dL — ABNORMAL LOW (ref 8.9–10.3)
Chloride: 106 mmol/L (ref 98–111)
Creatinine, Ser: 7.59 mg/dL — ABNORMAL HIGH (ref 0.61–1.24)
GFR, Estimated: 8 mL/min — ABNORMAL LOW (ref >60)
Glucose, Bld: 103 mg/dL — ABNORMAL HIGH (ref 70–99)
Potassium: 3 mmol/L — ABNORMAL LOW (ref 3.5–5.1)
Sodium: 139 mmol/L (ref 135–145)

## 2021-06-02 LAB — CBC
HCT: 21.5 % — ABNORMAL LOW (ref 39.0–52.0)
Hemoglobin: 7.3 g/dL — ABNORMAL LOW (ref 13.0–17.0)
MCH: 29.9 pg (ref 26.0–34.0)
MCHC: 34 g/dL (ref 30.0–36.0)
MCV: 88.1 fL (ref 80.0–100.0)
Platelets: 207 10*3/uL (ref 150–400)
RBC: 2.44 MIL/uL — ABNORMAL LOW (ref 4.22–5.81)
RDW: 14.2 % (ref 11.5–15.5)
WBC: 6 10*3/uL (ref 4.0–10.5)
nRBC: 0 % (ref 0.0–0.2)

## 2021-06-02 MED ORDER — AMLODIPINE BESYLATE 10 MG PO TABS: 10.0000 mg | ORAL_TABLET | Freq: Every day | ORAL | Status: DC

## 2021-06-02 MED ORDER — KIDNEY FAILURE BOOK: Freq: Once | Status: AC

## 2021-06-02 MED ORDER — AMLODIPINE BESYLATE 5 MG PO TABS
5.0000 mg | ORAL_TABLET | Freq: Every day | ORAL | Status: DC
  Administered 2021-06-02 – 2021-06-04 (×3): 5 mg via ORAL
  Filled 2021-06-02 (×3): qty 1

## 2021-06-02 NOTE — Progress Notes (Signed)
Rounded on patient today in correlation to transition to outpatient HD. Kidney Failure book given. Patient educated at the bedside regarding care of tunneled dialysis catheter, AV fistula care, assessment of thrill daily and proper medication administration on HD days.  Patient also educated on the importance of adhering to scheduled dialysis treatments, the effects of fluid overload, hyperkalemia and hyperphosphatemia. Patient capable of re-verbalizing via teach back method. Also educated patient on services available through the interdisciplinary team in the clinic setting. Patient reports that he lives with his sister and plans to drive himself to outpatient HD clinic. Patient requesting more information about diet restrictions. Dietitian consult ordered. Handouts and contact information provided to patient for any further assistance. Will follow as appropriate. ?

## 2021-06-02 NOTE — Progress Notes (Signed)
? ?[facility] ?PROGRESS NOTE ? ? ?Johnny [name] [id number] DOB: April 01, 1960 DOA: 05/29/2021  4 ?DOS: the patient was seen and examined on 06/02/2021 ? ?PCP: Pcp, No ? ?Brief History and Hospital Course:  ?61 year old male with pertinent PMH of OSA (does not wear CPAP), HTN, CHF (2009 Echo EF 55-60%), CKD 4 followed by nephrology presented to [facility] ED on 4/7 with hypoxic respiratory failure.  Patient had experienced a 30 pound weight gain in the past few weeks prior to admission.  He was noted to have worsening renal function.  Thought to be fluid overloaded.  Noted to have hypertensive emergency.  Admitted initially to the intensive care unit.  Required Cleviprex infusion.  Blood pressure was stabilized.  Seen by nephrology and started on dialysis.  Stabilized and then transferred to hospitalist service.  Requiring intermittent hemodialysis.  Plan is for AV fistula placement by vascular surgery during this admission. ? ?Consultants: Nephrology, interventional radiology ? ?Procedures: Tunneled HD catheter placement in the right internal jugular, 4/8 ? ? ? ?Subjective: ?Denies any complaints this morning.  Overall he feels well.  Asking when he will be able to go home.  No shortness of breath this morning.  No nausea vomiting.   ? ? ? ?Assessment/Plan: ? ?* Acute respiratory failure with hypoxia and hypercapnia (HCC) ?Patient was initially admitted to the intensive care unit.  Hypoxia was thought to be secondary to volume overload.  Patient was started on dialysis with improvement. ?Has been weaned off of oxygen.  Saturating in the 90s on room air. ? ?Volume overload ?Secondary to progression of renal disease.  Now being dialyzed. ? ?CKD (chronic kidney disease) stage 4, GFR 15-29 ml/min (HCC) ?See under acute kidney injury. ? ?AKI (acute kidney injury) ([place]) ?Has progressed to end-stage renal disease ? ?Nephrology is following.  Patient dialyzed initially on 4/9 and then on 4/10.  Looks like there is plan to  dialyze again today.   ?Vascular surgery was consulted for access.  AV fistula to be placed during this hospitalization. ?Patient to be established at outpatient dialysis clinic.   ?Patient also noted to be on furosemide.   ?Is noted to have urine output. ? ?Essential hypertension ?Initially required Cleviprex infusion in the ICU.   ?Blood pressure improved.  Transition to oral agents. ?Noted to be on amlodipine, carvedilol and furosemide. ?Blood pressure remains elevated.  Will increase amlodipine to 10 mg daily. ?Echocardiogram shows normal systolic function. ? ?History of anemia due to chronic kidney disease ?Hemoglobin is low.  Started on Aranesp by nephrology.  No evidence of overt blood loss. ? ?Sleep apnea ?Not compliant with CPAP. ? ?Morbid obesity ([place]) ?Estimated body mass index is 49.65 kg/m? as calculated from the following: ?  Height as of this encounter: '5\' 9"'$  (1.753 m). ?  Weight as of this encounter: 152.5 kg. ? ? ? ?DVT Prophylaxis: Subcutaneous heparin ?Code Status: Full code ?Family Communication: Discussed with patient ?Disposition Plan: Hopefully return home when improved.  He lives with his parents.  Seen by physical therapy.  No follow-up necessary at this time ? ?Status is: Inpatient ?Remains inpatient appropriate because: Progression of kidney disease, requiring dialysis ? ? ? ? ?Medications: Scheduled: ? amLODipine  5 mg Oral Daily  ? calcitRIOL  1 mcg Oral Daily  ? carvedilol  12.5 mg Oral BID WC  ? chlorhexidine  15 mL Mouth Rinse BID  ? Chlorhexidine Gluconate Cloth  6 each Topical Q0600  ? darbepoetin (ARANESP) injection - DIALYSIS  150 mcg  Intravenous Q Mon-HD  ? furosemide  80 mg Oral BID  ? heparin  5,000 Units Subcutaneous Q8H  ? lanthanum  500 mg Oral TID WC  ? mouth rinse  15 mL Mouth Rinse q12n4p  ? pneumococcal 20-valent conjugate vaccine  0.5 mL Intramuscular Tomorrow-1000  ? ?Continuous: ? sodium chloride    ? sodium chloride    ? ?TTS:VXBLTJ chloride, sodium chloride,  acetaminophen, alteplase, docusate sodium, heparin, hydrALAZINE, ipratropium-albuterol, lidocaine (PF), lidocaine-prilocaine, pentafluoroprop-tetrafluoroeth, polyethylene glycol ? ?Antibiotics: ?Anti-infectives (From admission, onward)  ? ? Start     Dose/Rate Route Frequency Ordered Stop  ? 05/30/21 1200  ceFAZolin (ANCEF) IVPB 2g/100 mL premix  Status:  Discontinued       ? over 30 Minutes Intravenous Continuous PRN 05/30/21 1211 05/31/21 1430  ? 05/29/21 2200  cefTRIAXone (ROCEPHIN) 2 g in sodium chloride 0.9 % 100 mL IVPB  Status:  Discontinued       ? 2 g ?200 mL/hr over 30 Minutes Intravenous Every 24 hours 05/29/21 2158 05/30/21 0835  ? 05/29/21 2200  azithromycin (ZITHROMAX) 500 mg in sodium chloride 0.9 % 250 mL IVPB  Status:  Discontinued       ? 500 mg ?250 mL/hr over 60 Minutes Intravenous Every 24 hours 05/29/21 2158 05/30/21 0835  ? ?  ? ? ?Objective: ? ?Vital Signs ? ?Vitals:  ? 06/02/21 0052 06/02/21 0346 06/02/21 0422 06/02/21 0815  ?BP: (!) 170/86 (!) 182/99  (!) 159/84  ?Pulse: 76 80  77  ?Resp: (!) '26 20  18  '$ ?Temp:  98.2 ?F (36.8 ?C)  97.9 ?F (36.6 ?C)  ?TempSrc:  Oral    ?SpO2: 92% 97%  96%  ?Weight:   (!) 151 kg   ?Height:      ? ? ?Intake/Output Summary (Last 24 hours) at 06/02/2021 0927 ?Last data filed at 06/02/2021 0100 ?Gross per 24 hour  ?Intake 250 ml  ?Output 3980 ml  ?Net -3730 ml  ? ? ?Filed Weights  ? 06/01/21 2120 06/02/21 0035 06/02/21 0422  ?Weight: (!) 153 kg (!) 151 kg (!) 151 kg  ? ? ?General appearance: Awake alert.  In no distress ?Resp: Improved air entry bilaterally.  Few crackles at the bases.  No wheezing or rhonchi. ?Cardio: S1-S2 is normal regular.  No S3-S4.  No rubs murmurs or bruit ?GI: Abdomen is soft.  Nontender nondistended.  Bowel sounds are present normal.  No masses organomegaly ?Extremities: Continues to have 2+ edema but slightly better today compared to yesterday. ?Neurologic: Alert and oriented x3.  No focal neurological deficits.  ? ? ?Lab Results: ? ?Data  Reviewed: I have personally reviewed labs and imaging study reports ? ?CBC: ?Recent Labs  ?Lab 05/29/21 ?1919 05/29/21 ?1938 05/30/21 ?0300 05/30/21 ?9233 05/31/21 ?0138 05/31/21 ?0076 06/01/21 ?0244 06/02/21 ?0319  ?WBC 12.2*  --  8.8  --  7.5 7.2 7.0 6.0  ?NEUTROABS 9.2*  --   --   --   --   --   --   --   ?HGB 9.5*   < > 8.1* 8.5* 7.5* 7.6* 7.2* 7.3*  ?HCT 30.3*   < > 25.0* 25.0* 23.2* 23.7* 21.9* 21.5*  ?MCV 95.0  --  92.3  --  91.0 91.5 90.1 88.1  ?PLT 274  --  227  --  212 214 199 207  ? < > = values in this interval not displayed.  ? ? ? ?Basic Metabolic Panel: ?Recent Labs  ?Lab 05/29/21 ?1919 05/29/21 ?1938 05/29/21 ?2027  05/29/21 ?2029 05/30/21 ?1610 05/30/21 ?9604 05/31/21 ?0138 06/01/21 ?0244 06/02/21 ?0319  ?NA 140   < > 139   < > 140 140 140 138 139  139  ?K 4.5   < > 4.5   < > 4.6 4.2 4.1 3.6 3.0*  3.0*  ?CL 105  --  109  --  103  --  105 104 106  104  ?CO2 19*  --   --   --  22  --  19* '22 24  23  '$ ?GLUCOSE 159*  --  155*  --  109*  --  95 113* 103*  101*  ?BUN 84*  --  92*  --  87*  --  95* 74* 50*  50*  ?CREATININE 11.88*  --  13.60*  --  11.85*  --  12.21* 10.19* 7.59*  7.59*  ?CALCIUM 7.8*  --   --   --  7.6*  --  7.7* 8.0* 8.1*  8.1*  ?MG  --   --   --   --  2.0  --  2.0 1.9  --   ?PHOS  --   --   --   --  8.2*  --  8.0*  --  4.3  ? < > = values in this interval not displayed.  ? ? ? ?GFR: ?Estimated Creatinine Clearance: 15.2 mL/min (A) (by C-G formula based on SCr of 7.59 mg/dL (H)). ? ?Liver Function Tests: ?Recent Labs  ?Lab 05/29/21 ?1919 05/30/21 ?5409 06/02/21 ?0319  ?AST 27  --   --   ?ALT 24  --   --   ?ALKPHOS 64  --   --   ?BILITOT 0.8  --   --   ?PROT 7.8  --   --   ?ALBUMIN 2.8* 2.4* 2.2*  ? ? ? ? ?CBG: ?Recent Labs  ?Lab 05/29/21 ?8119 05/30/21 ?1478 06/01/21 ?0759  ?GLUCAP 105* 107* 90  ? ? ? ?Recent Results (from the past 240 hour(s))  ?Resp Panel by RT-PCR (Flu A&B, Covid) Nasopharyngeal Swab     Status: None  ? Collection Time: 05/29/21  8:06 PM  ? Specimen:  Nasopharyngeal Swab; Nasopharyngeal(NP) swabs in vial transport medium  ?Result Value Ref Range Status  ? SARS Coronavirus 2 by RT PCR NEGATIVE NEGATIVE Final  ?  Comment: (NOTE) ?SARS-CoV-2 target nucleic acids are NOT DETECTED.

## 2021-06-02 NOTE — Progress Notes (Signed)
Advised by [facility] admissions that pt's case is being reviewed by Emilie Rutter. Awaiting final approval and final schedule.  ? ?Johnny [name] ?Renal Navigator ?[phone] ?

## 2021-06-02 NOTE — Plan of Care (Signed)

## 2021-06-02 NOTE — Progress Notes (Signed)
Pt refusing cpap for the night. ?

## 2021-06-02 NOTE — Progress Notes (Signed)
Pt refusing cpap at this time. ?

## 2021-06-02 NOTE — Progress Notes (Signed)
?[facility] ?Progress Note  ? ?Assessment/ Plan:   ?1.  Acute hypoxic respiratory failure: CAP + Vol overload ?- on azithro/ CTX; Treatment per primary ?- some response to IV diuretics but not adequate ?- continuing HD as below ?- will continue some lasix to assist with volume removal ? ?2.  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 Carlis Abbott planning on AVF creation on 4/12; appreciate help ?- CLIP underway ? ?3.  Anemia:  ?- s/p iv iron, continue aranesp 138mg weekly (started 4/10) ? ?4.  Hypertension:  ?- BP improving. Some mild hypotension on dialysis yesterday.  Decrease amlodipine 5 mg daily ? ?5.  Secondary hyperparathyroidism: Restart calcitriol for PTH control and begin phosphorus binder for hyperphosphatemia. ? ?Subjective:   ? ?Patient feels okay today.  Shortness of breath improving.  Feels like volume overload is improving.  Excellent urine output on Lasix.  Some low blood pressures on dialysis.  ? ?Objective:   ?BP (!) 158/88   Pulse 82   Temp 97.9 ?F (36.6 ?C)   Resp 19   Ht '5\' 9"'$  (1.753 m)   Wt (!) 151 kg   SpO2 92%   BMI 49.16 kg/m?  ? ?Physical Exam: ?GQBH:ALPFXTKup in bed, nad ?CVS: normal rate, no rub ?Resp: bilateral crackles. Mild iwob ?Abd: soft, + abd wall edema, mild distention ?Ext: 2+ anasarca, warm and well perfused ?Psych: mood and affect appropriate ? ?Labs: ?BMET ?Recent Labs  ?Lab 05/29/21 ?1919 05/29/21 ?1938 05/29/21 ?2027 05/29/21 ?2029 05/30/21 ?0240904/08/23 ?0735304/09/23 ?0138 06/01/21 ?0244 06/02/21 ?0319  ?NA 140   < > 139 139 140 140 140 138 139  139  ?K 4.5   < > 4.5 4.6 4.6 4.2 4.1 3.6 3.0*  3.0*  ?CL 105  --  109  --  103  --  105 104 106  104  ?CO2 19*  --   --   --  22  --  19* '22 24  23  '$ ?GLUCOSE 159*  --  155*  --  109*  --  95 113* 103*  101*  ?BUN 84*  --  92*  --  87*  --  95*  74* 50*  50*  ?CREATININE 11.88*  --  13.60*  --  11.85*  --  12.21* 10.19* 7.59*  7.59*  ?CALCIUM 7.8*  --   --   --  7.6*  --  7.7* 8.0* 8.1*  8.1*  ?PHOS  --   --   --   --  8.2*  --  8.0*  --  4.3  ? < > = values in this interval not displayed.  ? ?CBC ?Recent Labs  ?Lab 05/29/21 ?1919 05/29/21 ?1938 05/31/21 ?0138 05/31/21 ?0299204/10/23 ?0244 06/02/21 ?0319  ?WBC 12.2*   < > 7.5 7.2 7.0 6.0  ?NEUTROABS 9.2*  --   --   --   --   --   ?HGB 9.5*   < > 7.5* 7.6* 7.2* 7.3*  ?HCT 30.3*   < > 23.2* 23.7* 21.9* 21.5*  ?MCV 95.0   < > 91.0 91.5 90.1 88.1  ?PLT 274   < > 212 214 199 207  ? < > = values in this interval not displayed.  ? ? ?  ?Medications:   ? ?  amLODipine  5 mg Oral Daily  ? calcitRIOL  1 mcg Oral Daily  ? carvedilol  12.5 mg Oral BID WC  ? chlorhexidine  15 mL Mouth Rinse BID  ? Chlorhexidine Gluconate Cloth  6 each Topical Q0600  ? darbepoetin (ARANESP) injection - DIALYSIS  150 mcg Intravenous Q Mon-HD  ? furosemide  80 mg Oral BID  ? heparin  5,000 Units Subcutaneous Q8H  ? kidney failure book   Does not apply Once  ? lanthanum  500 mg Oral TID WC  ? mouth rinse  15 mL Mouth Rinse q12n4p  ? ? ? ?Reesa Chew ? ? ?06/02/2021, 10:41 AM   ?

## 2021-06-02 NOTE — Evaluation (Signed)
Occupational Therapy Evaluation ?Patient Details ?Name: Johnny [name] ?MRN: [id number] ?DOB: Aug 25, 1960 ?Today's Date: 06/02/2021 ? ? ?History of Present Illness Pt adm 4/7 with acute hypoxic respiratory failure and volume overload secondary to progression of renal disease. Pt started on HD.  PMH - diverticulosis, HTN, CKD stage IV, obesity, OSA  ? ?Clinical Impression ?  ?Pt admitted as above presenting with deficits as listed below (see OT problem list below). Pt reports Mod I PTA and prefers to not use AD for functional mobility and transfers. He is currently Min A for transfers and functional mobility to sink and chair and Mod assist for LE dressing/bathing. He should benefit from continued acute OT to assist in maximizing independence with functional mobility, transfers, ADL's and pt/family education. ?   ? ?Recommendations for follow up therapy are one component of a multi-disciplinary discharge planning process, led by the attending physician.  Recommendations may be updated based on patient status, additional functional criteria and insurance authorization.  ? ?Follow Up Recommendations ? Home health OT  ?  ?Assistance Recommended at Discharge PRN  ?Patient can return home with the following A little help with bathing/dressing/bathroom;Assistance with cooking/housework ? ?  ?Functional Status Assessment ? Patient has had a recent decline in their functional status and demonstrates the ability to make significant improvements in function in a reasonable and predictable amount of time.  ?Equipment Recommendations ? BSC/3in1;Other (comment);Tub/shower bench (Bariatric tub bench/3 in 1)  ?  ?Recommendations for Other Services   ? ? ?  ?Precautions / Restrictions Precautions ?Precautions: Fall ?Restrictions ?Weight Bearing Restrictions: No  ? ?  ? ?Mobility Bed Mobility ?Overal bed mobility: Needs Assistance ?Bed Mobility: Supine to Sit ?  ?  ?Supine to sit: Supervision, HOB elevated ?  ?  ?General bed mobility  comments: supervision for safety and lines ?  ? ?Transfers ?Overall transfer level: Needs assistance ?Equipment used: None ?Transfers: Sit to/from Stand ?Sit to Stand: Min guard ?  ?  ?  ?  ?  ?General transfer comment: Assist for safety and lines but no physical hands on ?  ? ?  ?Balance Overall balance assessment: Mild deficits observed, not formally tested ?  ?   ? ?ADL either performed or assessed with clinical judgement  ? ?ADL Overall ADL's : Needs assistance/impaired ?Eating/Feeding: Modified independent ?  ?Grooming: Wash/dry hands;Wash/dry face;Oral care;Sitting;Set up ?  ?Upper Body Bathing: Set up;Sitting ?  ?Lower Body Bathing: Sit to/from stand;Sitting/lateral leans;Minimal assistance;Moderate assistance ?Lower Body Bathing Details (indicate cue type and reason): Mod Assist washing feet ?Upper Body Dressing : Set up;Sitting ?  ?Lower Body Dressing: Minimal assistance;Moderate assistance ?Lower Body Dressing Details (indicate cue type and reason): Mod A don socks ?Toilet Transfer: Minimal assistance;Ambulation;BSC/3in1 ?Toilet Transfer Details (indicate cue type and reason): Pt would benefit from RW and bariatric 3:1, prefers to not use AD at this time. Will cont to assess in functional context. ?Toileting- Clothing Manipulation and Hygiene: Moderate assistance;Sitting/lateral lean;Sit to/from stand ?  ?  ?  ?Functional mobility during ADLs: Minimal assistance ?General ADL Comments: Pt was seen for OT assessment followed by participation in ADL retraining session for grooming and UB bathing as well as bed moblity and functional moblity to sink and chair as well as education in role of OT and recommendations - ie: bariatric tub bench for home. Pt with limited functional mobility however prefers to ambulate w/o AD. Pt was Mod I per his report prior to this admission. Will follow acutely to assist in maximizing independence with  ADL's and self care tasks.  ? ? ? ?Vision Baseline Vision/History: 0 No visual  deficits ?Vision Assessment?: No apparent visual deficits  ?   ?   ?   ? ?Pertinent Vitals/Pain Pain Assessment ?Pain Assessment: 0-10 ?Pain Score: 7  ?Pain Location: Headache ?Pain Descriptors / Indicators: Aching, Discomfort ?Pain Intervention(s): Limited activity within patient's tolerance, Monitored during session, Repositioned, Patient requesting pain meds-RN notified  ? ? ? ?Hand Dominance Right ?  ?Extremity/Trunk Assessment Upper Extremity Assessment ?Upper Extremity Assessment: Generalized weakness ?  ?Lower Extremity Assessment ?Lower Extremity Assessment: Defer to PT evaluation;Generalized weakness ?  ?  ?  ?Communication Communication ?Communication: No difficulties ?  ?Cognition Arousal/Alertness: Awake/alert ?Behavior During Therapy: [facility] for tasks assessed/performed ?Overall Cognitive Status: Within Functional Limits for tasks assessed ?  ?  ?  ?  ?  ?General Comments    ? ?  ?   ?   ? ? ?Home Living Family/patient expects to be discharged to:: Private residence ?Living Arrangements: Alone ?Available Help at Discharge: Family;Available PRN/intermittently ?Type of Home: House ?Home Access: Stairs to enter ?Entrance Stairs-Number of Steps: 4 ?Entrance Stairs-Rails: Right;Left;Can reach both ?Home Layout: One level ?  ?  ?Bathroom Shower/Tub: Tub/shower unit ?  ?Bathroom Toilet: Standard ?Bathroom Accessibility: Yes ?  ?Home Equipment: None ?  ?  ?  ? ?  ?Prior Functioning/Environment Prior Level of Function : Independent/Modified Independent ?  ?  ?  ?Mobility Comments: Doesn't use assistive device ?ADLs Comments: Pt reports Mod I ADL's ?  ? ?  ?  ?OT Problem List: Decreased knowledge of use of DME or AE;Decreased knowledge of precautions;Obesity;Decreased activity tolerance;Cardiopulmonary status limiting activity;Impaired balance (sitting and/or standing);Pain ?  ?   ?OT Treatment/Interventions: Self-care/ADL training;Patient/family education;Energy conservation;Therapeutic activities;DME and/or AE  instruction  ?  ?OT Goals(Current goals can be found in the care plan section) Acute Rehab OT Goals ?Patient Stated Goal: Get fistula placement tomorrow ?OT Goal Formulation: With patient ?Time For Goal Achievement: 06/16/21 ?Potential to Achieve Goals: Good  ?OT Frequency: Min 2X/week ?  ? ?   ?AM-PAC OT "6 Clicks" Daily Activity     ?Outcome Measure Help from another person eating meals?: None ?Help from another person taking care of personal grooming?: A Little ?Help from another person toileting, which includes using toliet, bedpan, or urinal?: A Little ?Help from another person bathing (including washing, rinsing, drying)?: A Lot ?Help from another person to put on and taking off regular upper body clothing?: None ?Help from another person to put on and taking off regular lower body clothing?: A Little ?6 Click Score: 19 ?  ?End of Session Equipment Utilized During Treatment: Oxygen ?Nurse Communication: Mobility status;Other (comment) (Pt requesting medication for headache) ? ?Activity Tolerance: Patient tolerated treatment well;Patient limited by fatigue;Other (comment) (c/o headache, RN made aware) ?Patient left: in chair;with call bell/phone within reach;with chair alarm set;with nursing/sitter in room ? ?OT Visit Diagnosis: Unsteadiness on feet (R26.81);Muscle weakness (generalized) (M62.81);Pain ?Pain - Right/Left:  (Headache) ?Pain - part of body:  (Headache)  ?              ?Time: 0922-0950 ?OT Time Calculation (min): 28 min ?Charges:  OT General Charges ?$OT Visit: 1 Visit ?OT Evaluation ?$OT Eval Low Complexity: 1 Low ?OT Treatments ?$Self Care/Home Management : 8-22 mins ? ?Percell Miller Beth Dixon, OTR/L ?06/02/2021, 10:09 AM ?

## 2021-06-02 NOTE — Progress Notes (Signed)
[facility] ? ?Subjective  -no complaints. ? ? ?Objective ?(!) 182/99 ?80 ?98.2 ?F (36.8 ?C) (Oral) ?20 ?97% ? ?Intake/Output Summary (Last 24 hours) at 06/02/2021 0617 ?Last data filed at 06/02/2021 0100 ?Gross per 24 hour  ?Intake 610 ml  ?Output 4420 ml  ?Net -3810 ml  ? ? ?Right IJ TDC ?Left radial and brachial pulse palpable ? ?Laboratory ?Lab Results: ?Recent Labs  ?  06/01/21 ?9983 06/02/21 ?0319  ?WBC 7.0 6.0  ?HGB 7.2* 7.3*  ?HCT 21.9* 21.5*  ?PLT 199 207  ? ?BMET ?Recent Labs  ?  06/01/21 ?0244 06/02/21 ?0319  ?NA 138 139  139  ?K 3.6 3.0*  3.0*  ?CL 104 106  104  ?CO2 '22 24  23  '$ ?GLUCOSE 113* 103*  101*  ?BUN 74* 50*  50*  ?CREATININE 10.19* 7.59*  7.59*  ?CALCIUM 8.0* 8.1*  8.1*  ? ? ?COAG ?Lab Results  ?Component Value Date  ? INR 1.2 06/12/2019  ? INR 2.2 12/28/2010  ? INR 2.2 11/30/2010  ? ?No results found for: PTT ? ?Assessment/Planning: ? ?61 year old male that vascular surgery was consulted for permanent hemodialysis access.  Patient is right-hand dominant.  Discussed plan for left arm AV fistula tomorrow in the OR with me.  Likely radiocephalic versus brachiocephalic with good cephalic vein.  Please keep n.p.o. after midnight.  Consent order placed in chart. ? ?[name] ?06/02/2021 ?6:17 AM ?-- ? ? ?

## 2021-06-02 NOTE — TOC Progression Note (Addendum)
Transition of Care (TOC) - Progression Note  ? ? ?Patient Details  ?Name: Johnny [name] ?MRN: [id number] ?Date of Birth: [birth year] ? ?Transition of Care (TOC) CM/SW Contact  ?Zenon Mayo, RN ?Phone Number: ?06/02/2021, 11:47 AM ? ?Clinical Narrative:    ? ?Transition of Care (TOC) Screening Note ? ? ?Patient Details  ?Name: Johnny [name] ?Date of Birth: 1960/05/07 ? ? ?Transition of Care (TOC) CM/SW Contact:    ?Zenon Mayo, RN ?Phone Number: ?06/02/2021, 11:48 AM ? ? ? ?Transfer from 54M , Transition of Care Department [facility]) has reviewed patient and no TOC needs have been identified at this time. We will continue to monitor patient advancement through interdisciplinary progression rounds. If new patient transition needs arise, please place a TOC consult. ?  ? ? ?  ?  ? ?Expected Discharge Plan and Services ?  ?  ?  ?  ?  ?                ?  ?  ?  ?  ?  ?  ?  ?  ?  ?  ? ? ?Social Determinants of Health (SDOH) Interventions ?  ? ?Readmission Risk Interventions ?   ? View : No data to display.  ?  ?  ?  ? ? ?

## 2021-06-02 NOTE — Progress Notes (Signed)
PT Cancellation Note ? ?Patient Details ?Name: Johnny [name] ?MRN: [id number] ?DOB: December 07, 1960 ? ? ?Cancelled Treatment:    Reason Eval/Treat Not Completed: Patient at procedure or test/unavailable Pt off floor at HD. Will follow. ? ? ?[place] ?06/02/2021, 12:31 PM ?Marisa Severin, PT, DPT ?Acute Rehabilitation Services ?Secure chat preferred ?Office [phone] ? ? ? ? ? ?

## 2021-06-02 NOTE — Progress Notes (Signed)
Hemodialysis- Tolerated well. 3L removed without issue. Patient has no complaints. Reported off to [employer]. ?

## 2021-06-03 ENCOUNTER — Inpatient Hospital Stay (HOSPITAL_COMMUNITY): Payer: Medicaid Other | Admitting: Anesthesiology

## 2021-06-03 ENCOUNTER — Other Ambulatory Visit: Payer: Self-pay

## 2021-06-03 ENCOUNTER — Encounter (HOSPITAL_COMMUNITY): Payer: Self-pay | Admitting: Pulmonary Disease

## 2021-06-03 ENCOUNTER — Encounter (HOSPITAL_COMMUNITY)
Admission: EM | Disposition: A | Discharge status: Home / Self Care | Payer: Self-pay | Source: Home / Self Care | Attending: Internal Medicine

## 2021-06-03 DIAGNOSIS — J9691 Respiratory failure, unspecified with hypoxia: Secondary | ICD-10-CM | POA: Diagnosis not present

## 2021-06-03 DIAGNOSIS — I10 Essential (primary) hypertension: Secondary | ICD-10-CM | POA: Diagnosis not present

## 2021-06-03 DIAGNOSIS — E8779 Other fluid overload: Secondary | ICD-10-CM | POA: Diagnosis not present

## 2021-06-03 DIAGNOSIS — N179 Acute kidney failure, unspecified: Secondary | ICD-10-CM | POA: Diagnosis not present

## 2021-06-03 DIAGNOSIS — N186 End stage renal disease: Secondary | ICD-10-CM

## 2021-06-03 DIAGNOSIS — J9601 Acute respiratory failure with hypoxia: Secondary | ICD-10-CM | POA: Diagnosis not present

## 2021-06-03 DIAGNOSIS — I509 Heart failure, unspecified: Secondary | ICD-10-CM | POA: Diagnosis not present

## 2021-06-03 DIAGNOSIS — Z862 Personal history of diseases of the blood and blood-forming organs and certain disorders involving the immune mechanism: Secondary | ICD-10-CM | POA: Diagnosis not present

## 2021-06-03 DIAGNOSIS — D631 Anemia in chronic kidney disease: Secondary | ICD-10-CM | POA: Diagnosis not present

## 2021-06-03 DIAGNOSIS — N189 Chronic kidney disease, unspecified: Secondary | ICD-10-CM

## 2021-06-03 DIAGNOSIS — I132 Hypertensive heart and chronic kidney disease with heart failure and with stage 5 chronic kidney disease, or end stage renal disease: Secondary | ICD-10-CM | POA: Diagnosis not present

## 2021-06-03 DIAGNOSIS — I120 Hypertensive chronic kidney disease with stage 5 chronic kidney disease or end stage renal disease: Secondary | ICD-10-CM | POA: Diagnosis not present

## 2021-06-03 DIAGNOSIS — N185 Chronic kidney disease, stage 5: Secondary | ICD-10-CM | POA: Diagnosis not present

## 2021-06-03 DIAGNOSIS — I5022 Chronic systolic (congestive) heart failure: Secondary | ICD-10-CM

## 2021-06-03 DIAGNOSIS — J9602 Acute respiratory failure with hypercapnia: Secondary | ICD-10-CM | POA: Diagnosis not present

## 2021-06-03 DIAGNOSIS — N2581 Secondary hyperparathyroidism of renal origin: Secondary | ICD-10-CM | POA: Diagnosis not present

## 2021-06-03 DIAGNOSIS — N184 Chronic kidney disease, stage 4 (severe): Secondary | ICD-10-CM | POA: Diagnosis not present

## 2021-06-03 HISTORY — PX: AV FISTULA PLACEMENT: SHX1204

## 2021-06-03 LAB — CULTURE, BLOOD (ROUTINE X 2)
Culture: NO GROWTH
Culture: NO GROWTH
Special Requests: ADEQUATE

## 2021-06-03 LAB — POCT I-STAT, CHEM 8
BUN: 41 mg/dL — ABNORMAL HIGH (ref 6–20)
Calcium, Ion: 0.94 mmol/L — ABNORMAL LOW (ref 1.15–1.40)
Chloride: 105 mmol/L (ref 98–111)
Creatinine, Ser: 7.6 mg/dL — ABNORMAL HIGH (ref 0.61–1.24)
Glucose, Bld: 95 mg/dL (ref 70–99)
HCT: 21 % — ABNORMAL LOW (ref 39.0–52.0)
Hemoglobin: 7.1 g/dL — ABNORMAL LOW (ref 13.0–17.0)
Potassium: 3.7 mmol/L (ref 3.5–5.1)
Sodium: 139 mmol/L (ref 135–145)
TCO2: 26 mmol/L (ref 22–32)

## 2021-06-03 LAB — TYPE AND SCREEN
ABO/RH(D): A POS
Antibody Screen: NEGATIVE

## 2021-06-03 SURGERY — ARTERIOVENOUS (AV) FISTULA CREATION
Anesthesia: Regional | Site: Arm Lower | Laterality: Left

## 2021-06-03 MED ORDER — DEXTROSE 5 % IV SOLN
INTRAVENOUS | Status: DC | PRN
  Administered 2021-06-03: 3 g via INTRAVENOUS

## 2021-06-03 MED ORDER — CHLORHEXIDINE GLUCONATE 0.12 % MT SOLN
15.0000 mL | Freq: Once | OROMUCOSAL | Status: AC
  Administered 2021-06-03: 15 mL via OROMUCOSAL

## 2021-06-03 MED ORDER — OXYCODONE HCL 5 MG PO TABS
ORAL_TABLET | ORAL | Status: AC
  Administered 2021-06-03: 5 mg via ORAL
  Filled 2021-06-03: qty 1

## 2021-06-03 MED ORDER — ONDANSETRON HCL 4 MG/2ML IJ SOLN
INTRAMUSCULAR | Status: DC | PRN
  Administered 2021-06-03: 4 mg via INTRAVENOUS

## 2021-06-03 MED ORDER — SODIUM CHLORIDE 0.9 % IV SOLN: INTRAVENOUS | Status: DC | PRN

## 2021-06-03 MED ORDER — BUPIVACAINE HCL (PF) 0.5 % IJ SOLN
INTRAMUSCULAR | Status: AC
  Filled 2021-06-03: qty 30

## 2021-06-03 MED ORDER — FENTANYL CITRATE (PF) 100 MCG/2ML IJ SOLN: 25.0000 ug | INTRAMUSCULAR | Status: DC | PRN

## 2021-06-03 MED ORDER — FENTANYL CITRATE (PF) 100 MCG/2ML IJ SOLN
100.0000 ug | Freq: Once | INTRAMUSCULAR | Status: AC
  Filled 2021-06-03: qty 2

## 2021-06-03 MED ORDER — CEFAZOLIN IN SODIUM CHLORIDE 3-0.9 GM/100ML-% IV SOLN
INTRAVENOUS | Status: AC
  Filled 2021-06-03: qty 100

## 2021-06-03 MED ORDER — LIDOCAINE HCL (PF) 2 % IJ SOLN
INTRAMUSCULAR | Status: DC | PRN
  Administered 2021-06-03: 300 mg via PERINEURAL

## 2021-06-03 MED ORDER — FENTANYL CITRATE (PF) 100 MCG/2ML IJ SOLN
INTRAMUSCULAR | Status: AC
  Administered 2021-06-03: 100 ug
  Filled 2021-06-03: qty 2

## 2021-06-03 MED ORDER — LIDOCAINE-EPINEPHRINE 1 %-1:100000 IJ SOLN
INTRAMUSCULAR | Status: DC | PRN
  Administered 2021-06-03: 10 mL

## 2021-06-03 MED ORDER — GLYCOPYRROLATE 0.2 MG/ML IJ SOLN
INTRAMUSCULAR | Status: DC | PRN
  Administered 2021-06-03: .2 mg via INTRAVENOUS

## 2021-06-03 MED ORDER — 0.9 % SODIUM CHLORIDE (POUR BTL) OPTIME
TOPICAL | Status: DC | PRN
  Administered 2021-06-03: 1000 mL

## 2021-06-03 MED ORDER — HEPARIN SODIUM (PORCINE) 1000 UNIT/ML IJ SOLN
INTRAMUSCULAR | Status: DC | PRN
  Administered 2021-06-03: 3000 [IU] via INTRAVENOUS

## 2021-06-03 MED ORDER — DROPERIDOL 2.5 MG/ML IJ SOLN: 0.6250 mg | Freq: Once | INTRAMUSCULAR | Status: DC | PRN

## 2021-06-03 MED ORDER — ORAL CARE MOUTH RINSE: 15.0000 mL | Freq: Once | OROMUCOSAL | Status: AC

## 2021-06-03 MED ORDER — ACETAMINOPHEN 500 MG PO TABS
1000.0000 mg | ORAL_TABLET | Freq: Once | ORAL | Status: AC
  Administered 2021-06-03: 1000 mg via ORAL
  Filled 2021-06-03: qty 2

## 2021-06-03 MED ORDER — HEPARIN SODIUM (PORCINE) 1000 UNIT/ML IJ SOLN
1000.0000 [IU] | Freq: Once | INTRAMUSCULAR | Status: AC
  Administered 2021-06-03: 1000 [IU]
  Filled 2021-06-03: qty 1

## 2021-06-03 MED ORDER — OXYCODONE HCL 5 MG PO TABS: 5.0000 mg | ORAL_TABLET | Freq: Once | ORAL | Status: AC

## 2021-06-03 MED ORDER — HEPARIN 6000 UNIT IRRIGATION SOLUTION
Status: DC | PRN
  Administered 2021-06-03: 1

## 2021-06-03 MED ORDER — SODIUM CHLORIDE 0.9 % IV SOLN: INTRAVENOUS | Status: DC

## 2021-06-03 MED ORDER — HEPARIN 6000 UNIT IRRIGATION SOLUTION
Status: AC
  Filled 2021-06-03: qty 500

## 2021-06-03 MED ORDER — PROPOFOL 500 MG/50ML IV EMUL
INTRAVENOUS | Status: DC | PRN
  Administered 2021-06-03: 50 ug/kg/min via INTRAVENOUS
  Administered 2021-06-03: 20 mg via INTRAVENOUS

## 2021-06-03 SURGICAL SUPPLY — 35 items
ADH SKN CLS APL DERMABOND .7 (GAUZE/BANDAGES/DRESSINGS) ×1
ARMBAND PINK RESTRICT EXTREMIT (MISCELLANEOUS) ×6 IMPLANT
BAG COUNTER SPONGE SURGICOUNT (BAG) ×3 IMPLANT
BAG SPNG CNTER NS LX DISP (BAG) ×1
BLADE CLIPPER SURG (BLADE) ×3 IMPLANT
CANISTER SUCT 3000ML PPV (MISCELLANEOUS) ×3 IMPLANT
CLIP TI WIDE RED SMALL 6 (CLIP) ×1 IMPLANT
CLIP VESOCCLUDE MED 6/CT (CLIP) ×3 IMPLANT
CLIP VESOCCLUDE SM WIDE 6/CT (CLIP) ×3 IMPLANT
COVER PROBE W GEL 5X96 (DRAPES) ×4 IMPLANT
DECANTER SPIKE VIAL GLASS SM (MISCELLANEOUS) ×2 IMPLANT
DERMABOND ADVANCED (GAUZE/BANDAGES/DRESSINGS) ×1
DERMABOND ADVANCED .7 DNX12 (GAUZE/BANDAGES/DRESSINGS) ×2 IMPLANT
ELECT REM PT RETURN 9FT ADLT (ELECTROSURGICAL) ×2
ELECTRODE REM PT RTRN 9FT ADLT (ELECTROSURGICAL) ×2 IMPLANT
GLOVE BIO SURGEON STRL SZ7.5 (GLOVE) ×3 IMPLANT
GLOVE BIOGEL PI IND STRL 8 (GLOVE) ×2 IMPLANT
GLOVE BIOGEL PI INDICATOR 8 (GLOVE) ×1
GOWN STRL REUS W/ TWL LRG LVL3 (GOWN DISPOSABLE) ×4 IMPLANT
GOWN STRL REUS W/ TWL XL LVL3 (GOWN DISPOSABLE) ×4 IMPLANT
GOWN STRL REUS W/TWL LRG LVL3 (GOWN DISPOSABLE) ×4
GOWN STRL REUS W/TWL XL LVL3 (GOWN DISPOSABLE) ×4
KIT BASIN OR (CUSTOM PROCEDURE TRAY) ×3 IMPLANT
KIT TURNOVER KIT B (KITS) ×3 IMPLANT
NS IRRIG 1000ML POUR BTL (IV SOLUTION) ×3 IMPLANT
PACK CV ACCESS (CUSTOM PROCEDURE TRAY) ×3 IMPLANT
PAD ARMBOARD 7.5X6 YLW CONV (MISCELLANEOUS) ×6 IMPLANT
SLING ARM FOAM STRAP LRG (SOFTGOODS) ×1 IMPLANT
SUT MNCRL AB 4-0 PS2 18 (SUTURE) ×3 IMPLANT
SUT PROLENE 6 0 BV (SUTURE) ×4 IMPLANT
SUT VIC AB 3-0 SH 27 (SUTURE) ×2
SUT VIC AB 3-0 SH 27X BRD (SUTURE) ×2 IMPLANT
TOWEL GREEN STERILE (TOWEL DISPOSABLE) ×3 IMPLANT
UNDERPAD 30X36 HEAVY ABSORB (UNDERPADS AND DIAPERS) ×3 IMPLANT
WATER STERILE IRR 1000ML POUR (IV SOLUTION) ×3 IMPLANT

## 2021-06-03 NOTE — Progress Notes (Signed)
?[facility] ?Progress Note  ? ?Assessment/ Plan:   ?1.  Acute hypoxic respiratory failure: CAP + Vol overload ?-Status post azithro/ CTX ?- some response to IV diuretics but not adequate ?- continuing HD as below ?- will continue some lasix to assist with volume removal ? ?2.  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 Carlis Abbott planning on AVF creation on 4/12; appreciate help ?- CLIP underway; likely GO ? ?3.  Anemia:  ?- s/p iv iron, continue aranesp 134mg weekly (started 4/10) ? ?4.  Hypertension:  ?- BP improving.  Continue current medications ? ?5.  Secondary hyperparathyroidism: Restart calcitriol for PTH control and begin phosphorus binder for hyperphosphatemia. ? ?Subjective:   ? ?Patient states that dialysis went well yesterday.  Denies any specific complaints today.  Breathing is improved.  ? ?Objective:   ?BP (!) 159/83 (BP Location: Right Arm)   Pulse 77   Temp 98.4 ?F (36.9 ?C) (Oral)   Resp (!) 24   Ht '5\' 9"'$  (1.753 m)   Wt (!) 152.4 kg   SpO2 92%   BMI 49.62 kg/m?  ? ?Physical Exam: ?GEXN:TZGYFin bed, nad ?CVS: normal rate, no rub ?Resp: bilateral crackles. Mild iwob ?Abd: soft, + abd wall edema, mild distention ?Ext: 2+ anasarca, warm and well perfused ?Psych: mood and affect appropriate ? ?Labs: ?BMET ?Recent Labs  ?Lab 05/29/21 ?1919 05/29/21 ?1938 05/29/21 ?2027 05/29/21 ?2029 05/30/21 ?[id number]/08/23 ?0496704/09/23 ?0138 06/01/21 ?0244 06/02/21 ?0319  ?NA 140   < > 139 139 140 140 140 138 139  139  ?K 4.5   < > 4.5 4.6 4.6 4.2 4.1 3.6 3.0*  3.0*  ?CL 105  --  109  --  103  --  105 104 106  104  ?CO2 19*  --   --   --  22  --  19* '22 24  23  '$ ?GLUCOSE 159*  --  155*  --  109*  --  95 113* 103*  101*  ?BUN 84*  --  92*  --  87*  --  95* 74* 50*  50*  ?CREATININE 11.88*  --  13.60*  --  11.85*  --  12.21* 10.19* 7.59*  7.59*  ?CALCIUM 7.8*  --   --    --  7.6*  --  7.7* 8.0* 8.1*  8.1*  ?PHOS  --   --   --   --  8.2*  --  8.0*  --  4.3  ? < > = values in this interval not displayed.  ? ?CBC ?Recent Labs  ?Lab 05/29/21 ?1919 05/29/21 ?1938 05/31/21 ?0138 05/31/21 ?0591604/10/23 ?0244 06/02/21 ?0319  ?WBC 12.2*   < > 7.5 7.2 7.0 6.0  ?NEUTROABS 9.2*  --   --   --   --   --   ?HGB 9.5*   < > 7.5* 7.6* 7.2* 7.3*  ?HCT 30.3*   < > 23.2* 23.7* 21.9* 21.5*  ?MCV 95.0   < > 91.0 91.5 90.1 88.1  ?PLT 274   < > 212 214 199 207  ? < > = values in this interval not displayed.  ? ? ?  ?Medications:   ? ? amLODipine  5 mg Oral Daily  ? calcitRIOL  1 mcg Oral Daily  ? carvedilol  12.5 mg Oral BID  WC  ? chlorhexidine  15 mL Mouth Rinse BID  ? Chlorhexidine Gluconate Cloth  6 each Topical Q0600  ? darbepoetin (ARANESP) injection - DIALYSIS  150 mcg Intravenous Q Mon-HD  ? furosemide  80 mg Oral BID  ? heparin  5,000 Units Subcutaneous Q8H  ? lanthanum  500 mg Oral TID WC  ? mouth rinse  15 mL Mouth Rinse q12n4p  ? ? ? ?Reesa Chew ? ? ?06/03/2021, 9:30 AM   ?

## 2021-06-03 NOTE — Progress Notes (Signed)
Physical Therapy Treatment ?Patient Details ?Name: Johnny [name] ?MRN: [id number] ?DOB: 1960-03-14 ?Today's Date: 06/03/2021 ? ? ?History of Present Illness Pt adm 4/7 with acute hypoxic respiratory failure and volume overload secondary to progression of renal disease. Pt started on HD.  PMH - diverticulosis, HTN, CKD stage IV, obesity, OSA ? ?  ?PT Comments  ? ? The pt was agreeable to session, but self-limited ambulation distance to ~20 ft due to pain in BLE. The pt was able to demo improved stability without need for UE support, and completed all mobility without O2 with Spo2 > 92% on RA. Will continue to benefit from skilled PT for mobility progression as tolerated.  ?   ?Recommendations for follow up therapy are one component of a multi-disciplinary discharge planning process, led by the attending physician.  Recommendations may be updated based on patient status, additional functional criteria and insurance authorization. ? ?Follow Up Recommendations ? No PT follow up ?  ?  ?Assistance Recommended at Discharge Intermittent Supervision/Assistance  ?Patient can return home with the following Help with stairs or ramp for entrance ?  ?Equipment Recommendations ? None recommended by PT (pt declining DME)  ?  ?Recommendations for Other Services   ? ? ?  ?Precautions / Restrictions Precautions ?Precautions: Fall ?Precaution Comments: watch O2, on RA for today's session ?Restrictions ?Weight Bearing Restrictions: No  ?  ? ?Mobility ? Bed Mobility ?Overal bed mobility: Needs Assistance ?Bed Mobility: Supine to Sit ?  ?  ?Supine to sit: Supervision, HOB elevated ?  ?  ?General bed mobility comments: increased time, line management ?  ? ?Transfers ?Overall transfer level: Needs assistance ?Equipment used: None ?Transfers: Sit to/from Stand ?Sit to Stand: Min guard ?  ?  ?  ?  ?  ?General transfer comment: minG for safety, no overt LOB with initial stand. ?  ? ?Ambulation/Gait ?Ambulation/Gait assistance: Min guard ?Gait  Distance (Feet): 20 Feet ?Assistive device: None ?Gait Pattern/deviations: Step-through pattern, Decreased step length - right, Decreased step length - left, Antalgic, Wide base of support ?Gait velocity: decr ?Gait velocity interpretation: <1.31 ft/sec, indicative of household ambulator ?  ?General Gait Details: wide BOS with no UE support, slow but generally steady. pt self-limiting to walking to door and back today due to LE pain ? ? ? ?  ?Balance Overall balance assessment: Mild deficits observed, not formally tested ?  ?  ?  ?  ?  ?  ?  ?  ?  ?  ?  ?  ?  ?  ?  ?  ?  ?  ?  ? ?  ?Cognition Arousal/Alertness: Awake/alert ?Behavior During Therapy: Johnny [name] for tasks assessed/performed ?Overall Cognitive Status: Within Functional Limits for tasks assessed ?  ?  ?  ?  ?  ?  ?  ?  ?  ?  ?  ?  ?  ?  ?  ?  ?  ?  ?  ? ?  ?   ?General Comments General comments (skin integrity, edema, etc.): VSS on RA upon my arrival, left on RA with SpO2 >95% ?  ?  ? ?Pertinent Vitals/Pain Pain Assessment ?Pain Assessment: Faces ?Faces Pain Scale: Hurts even more ?Pain Location: BLE, feet ?Pain Descriptors / Indicators: Aching, Discomfort ?Pain Intervention(s): Limited activity within patient's tolerance, Monitored during session  ? ? ? ?PT Goals (current goals can now be found in the care plan section) Acute Rehab PT Goals ?Patient Stated Goal: feet to stop hurting and return home ?PT  Goal Formulation: With patient ?Time For Goal Achievement: 06/15/21 ?Potential to Achieve Goals: Good ?Progress towards PT goals: Progressing toward goals ? ?  ?Frequency ? ? ? Min 3X/week ? ? ? ?  ?PT Plan Current plan remains appropriate  ? ? ?   ?AM-PAC PT "6 Clicks" Mobility   ?Outcome Measure ? Help needed turning from your back to your side while in a flat bed without using bedrails?: None ?Help needed moving from lying on your back to sitting on the side of a flat bed without using bedrails?: A Little ?Help needed moving to and from a bed to a chair  (including a wheelchair)?: A Little ?Help needed standing up from a chair using your arms (e.g., wheelchair or bedside chair)?: A Little ?Help needed to walk in [name]?: A Little ?Help needed climbing 3-5 steps with a railing? : Total ?6 Click Score: 17 ? ?  ?End of Session Equipment Utilized During Treatment: Gait belt ?Activity Tolerance: Patient limited by pain ?Patient left: in chair;with call bell/phone within reach ?Nurse Communication: Mobility status;Other (comment) ?PT Visit Diagnosis: Other abnormalities of gait and mobility (R26.89);Pain ?Pain - part of body: Ankle and joints of foot ?  ? ? ?Time: 4580-9983 ?PT Time Calculation (min) (ACUTE ONLY): 16 min ? ?Charges:  $Therapeutic Exercise: 8-22 mins          ?          ? ?[place] Carbo, PT, DPT  ? ?Acute Rehabilitation Department ?Pager #: [phone] - [phone] ? ? ?Sandra Cockayne ?06/03/2021, 10:44 AM ? ?

## 2021-06-03 NOTE — Progress Notes (Signed)
Mobility Specialist Progress Note: ? ? 06/03/21 0948  ?Mobility  ?Activity Ambulated with assistance to bathroom  ?Level of Assistance Standby assist, set-up cues, supervision of patient - no hands on  ?Assistive Device None  ?Distance Ambulated (ft) 30 ft  ?Activity Response Tolerated well  ?$Mobility charge 1 Mobility  ? ?Pt requesting to go to BR for BM. No physical assist required, only set-up. Pt left in BR, educated to pull NSG string when ready. Will f/u to get back. ? ?Nelta Numbers ?Acute Rehab ?Phone: [phone] ?Office Phone: [phone] ? ?

## 2021-06-03 NOTE — Progress Notes (Addendum)
Contacted [facility] admissions this am. Awaiting final approval from clinic. Will receive pt's final schedule when approval received.  ? ?Melven Sartorius ?Renal Navigator ?[phone] ? ?Addendum at 11:34 am: ?Pt has been approved for [facility] on MWF. Pt can start on Friday. Pt will need to arrive at 10:45 on Friday for paperwork prior to 11:50 chair time. Update provided to nephrologist. Will add arrangements to pt's AVS. Unable to speak to pt at this time due to pt having a procedure. Will f/u with pt later today if possible.  ? ? ?Addendum at 4:15 pm: ?Spoke to pt via phone. Advised pt of above clinic and schedule. Pt is requesting an earlier appt. Contacted clinic and they currently do not have an earlier appt available.Will make pt aware of this info tomorrow and provide pt with schedule letter at that time. Contacted renal PA regarding clinic's need for orders.  ?

## 2021-06-03 NOTE — Discharge Instructions (Signed)
? ?[facility] ? ?Discharge Instructions ? ?AV Fistula or Graft Surgery for Dialysis Access ? ?Please refer to the following instructions for your post-procedure care. Your surgeon or physician assistant will discuss any changes with you. ? ?Activity ? ?You may drive the day following your surgery, if you are comfortable and no longer taking prescription pain medication. Resume full activity as the soreness in your incision resolves. ? ?Bathing/Showering ? ?You may shower after you go home. Keep your incision dry for 48 hours. Do not soak in a bathtub, hot tub, or swim until the incision heals completely. You may not shower if you have a hemodialysis catheter. ? ?Incision Care ? ?Clean your incision with mild soap and water after 48 hours. Pat the area dry with a clean towel. You do not need a bandage unless otherwise instructed. Do not apply any ointments or creams to your incision. You may have skin glue on your incision. Do not peel it off. It will come off on its own in about one week. Your arm may swell a bit after surgery. To reduce swelling use pillows to elevate your arm so it is above your heart. Your doctor will tell you if you need to lightly wrap your arm with an ACE bandage. ? ?Diet ? ?Resume your normal diet. There are not special food restrictions following this procedure. In order to heal from your surgery, it is CRITICAL to get adequate nutrition. Your body requires vitamins, minerals, and protein. Vegetables are the best source of vitamins and minerals. Vegetables also provide the perfect balance of protein. Processed food has little nutritional value, so try to avoid this. ? ?Medications ? ?Resume taking all of your medications. If your incision is causing pain, you may take over-the counter pain relievers such as acetaminophen (Tylenol). If you were prescribed a stronger pain medication, please be aware these medications can cause nausea and constipation. Prevent  nausea by taking the medication with a snack or meal. Avoid constipation by drinking plenty of fluids and eating foods with high amount of fiber, such as fruits, vegetables, and grains.  ?Do not take Tylenol if you are taking prescription pain medications. ? ?Follow up ?Your surgeon may want to see you in the office following your access surgery. If so, this will be arranged at the time of your surgery. ? ?Please call us immediately for any of the following conditions: ? ?Increased pain, redness, drainage (pus) from your incision site ?Fever of 101 degrees or higher ?Severe or worsening pain at your incision site ?Hand pain or numbness. ? ?Reduce your risk of vascular disease: ? ?Stop smoking. If you would like help, call [facility] at 1-800-QUIT-NOW 7091793104) or [place] at [phone] ? ?Manage your cholesterol ?Maintain a desired weight ?Control your diabetes ?Keep your blood pressure down ? ?Dialysis ? ?It will take several weeks to several months for your new dialysis access to be ready for use. Your surgeon will determine when it is okay to use it. Your nephrologist will continue to direct your dialysis. You can continue to use your Permcath until your new access is ready for use. ? ? ?06/03/2021 ?Johnny [name] ?[id number] ?1961-02-06 ? ?Surgeon(s): ?[name], MD ? ?Procedure(s): ?LEFT RADIOCEPHALIC ARTERIOVENOUS (AV) FISTULA CREATION ? ? May stick graft immediately  ? May stick graft on designated area only:   ?X Do not stick left AV fistula for 12 weeks  ? ? ?If you have any questions, please call the office at  [phone]. ?

## 2021-06-03 NOTE — Progress Notes (Signed)
[facility] ? ?Subjective  -no complaints ? ? ?Objective ?(!) 164/90 ?80 ?97.9 ?F (36.6 ?C) (Oral) ?18 ?99% ? ?Intake/Output Summary (Last 24 hours) at 06/03/2021 1137 ?Last data filed at 06/03/2021 0500 ?Gross per 24 hour  ?Intake 400 ml  ?Output 3950 ml  ?Net -3550 ml  ? ? ?Left radial brachial pulse palpable ?Right IJ TDC ? ?Laboratory ?Lab Results: ?Recent Labs  ?  06/01/21 ?1443 06/02/21 ?0319  ?WBC 7.0 6.0  ?HGB 7.2* 7.3*  ?HCT 21.9* 21.5*  ?PLT 199 207  ? ?BMET ?Recent Labs  ?  06/01/21 ?0244 06/02/21 ?0319  ?NA 138 139  139  ?K 3.6 3.0*  3.0*  ?CL 104 106  104  ?CO2 '22 24  23  '$ ?GLUCOSE 113* 103*  101*  ?BUN 74* 50*  50*  ?CREATININE 10.19* 7.59*  7.59*  ?CALCIUM 8.0* 8.1*  8.1*  ? ? ?COAG ?Lab Results  ?Component Value Date  ? INR 1.2 06/12/2019  ? INR 2.2 12/28/2010  ? INR 2.2 11/30/2010  ? ?No results found for: PTT ? ?Assessment/Planning: ? ?61 year old male that vascular surgery was consulted for permanent hemodialysis access.  Plan left arm AV fistula today likely radiocephalic versus brachiocephalic.  Risk benefits discussed. ? ?Johnny [name] ?06/03/2021 ?11:37 AM ?-- ? ? ?

## 2021-06-03 NOTE — Plan of Care (Signed)
  Problem: Education: Goal: Knowledge of General Education information will improve Description Including pain rating scale, medication(s)/side effects and non-pharmacologic comfort measures Outcome: Progressing   Problem: Health Behavior/Discharge Planning: Goal: Ability to manage health-related needs will improve Outcome: Progressing   

## 2021-06-03 NOTE — Progress Notes (Signed)
Pt refuses CPAP 

## 2021-06-03 NOTE — Transfer of Care (Signed)
Immediate Anesthesia Transfer of Care Note ? ?Patient: Johnny [name] ? ?Procedure(s) Performed: LEFT RADIOCEPHALIC ARTERIOVENOUS (AV) FISTULA CREATION (Left: Arm Lower) ? ?Patient Location: PACU ? ?Anesthesia Type:MAC combined with regional for post-op pain ? ?Level of Consciousness: awake, alert  and patient cooperative ? ?Airway & Oxygen Therapy: Patient Spontanous Breathing ? ?Post-op Assessment: Report given to RN, Post -op Vital signs reviewed and stable and Patient moving all extremities ? ?Post vital signs: Reviewed and stable ? ?Last Vitals:  ?Vitals Value Taken Time  ?BP 132/75 06/03/21 1350  ?Temp    ?Pulse 109 06/03/21 1354  ?Resp 20 06/03/21 1354  ?SpO2 93 % 06/03/21 1354  ?Vitals shown include unvalidated device data. ? ?Last Pain:  ?Vitals:  ? 06/03/21 1215  ?TempSrc:   ?PainSc: 0-No pain  ?   ? ?Patients Stated Pain Goal: 0 (05/31/21 2000) ? ?Complications: No notable events documented. ?

## 2021-06-03 NOTE — Anesthesia Preprocedure Evaluation (Addendum)
Anesthesia Evaluation  ?Patient identified by MRN, date of birth, ID band ?Patient awake ? ? ? ?Reviewed: ?Allergy & Precautions, NPO status , Patient's Chart, lab work & pertinent test results ? ?Airway ?Mallampati: II ? ?TM Distance: >3 FB ?Neck ROM: Full ? ? ? Dental ? ?(+) Dental Advisory Given, Edentulous Upper, Edentulous Lower ?  ?Pulmonary ?sleep apnea ,  ?  ?Pulmonary exam normal ?breath sounds clear to auscultation ? ? ? ? ? ? Cardiovascular ?hypertension, Pt. on home beta blockers and Pt. on medications ?+CHF  ?Normal cardiovascular exam ?Rhythm:Regular Rate:Normal ? ?Echo 05/2021 ??1. Left ventricular ejection fraction, by estimation, is 55%. The left ventricle has normal function. The left ventricle has no regional wall motion abnormalities. Left ventricular diastolic parameters were normal.  ??2. Right ventricular systolic function is normal. The right ventricular size is normal.  ??3. Left atrial size was moderately dilated.  ??4. The mitral valve is normal in structure. Trivial mitral valve regurgitation. No evidence of mitral stenosis.  ??5. The aortic valve is normal in structure. Aortic valve regurgitation is not visualized. No aortic stenosis is present.  ??6. The inferior vena cava is normal in size with greater than 50% respiratory variability, suggesting right atrial pressure of 3 mmHg.  ?  ?Neuro/Psych ?negative neurological ROS ? negative psych ROS  ? GI/Hepatic ?Neg liver ROS, painless hematochezia ?  ?Endo/Other  ?Morbid obesity ? Renal/GU ?Renal InsufficiencyRenal disease  ? ?  ?Musculoskeletal ?negative musculoskeletal ROS ?(+)  ? Abdominal ?(+) + obese,   ?Peds ? Hematology ? ?(+) Blood dyscrasia, anemia ,   ?Anesthesia Other Findings ? ? Reproductive/Obstetrics ? ?  ? ? ? ? ? ? ? ? ? ? ? ? ? ?  ?  ? ? ? ? ? ? ? ?Anesthesia Physical ? ?Anesthesia Plan ? ?ASA: 3 ? ?Anesthesia Plan: Regional  ? ?Post-op Pain Management: Tylenol PO (pre-op)*  ? ?Induction:  Intravenous ? ?PONV Risk Score and Plan: 1 ? ?Airway Management Planned: Natural Airway and Nasal Cannula ? ?Additional Equipment:  ? ?Intra-op Plan:  ? ?Post-operative Plan:  ? ?Informed Consent: I have reviewed the patients History and Physical, chart, labs and discussed the procedure including the risks, benefits and alternatives for the proposed anesthesia with the patient or authorized representative who has indicated his/her understanding and acceptance.  ? ? ? ?Dental advisory given ? ?Plan Discussed with: CRNA ? ?Anesthesia Plan Comments:   ? ? ? ? ? ?Anesthesia Quick Evaluation ? ?

## 2021-06-03 NOTE — Anesthesia Procedure Notes (Signed)
Anesthesia Regional Block: Supraclavicular block  ? ?Pre-Anesthetic Checklist: , timeout performed,  Correct Patient, Correct Site, Correct Laterality,  Correct Procedure, Correct Position, site marked,  Risks and benefits discussed,  Surgical consent,  Pre-op evaluation,  At surgeon's request and post-op pain management ? ?Laterality: Upper and Left ? ?Prep: chloraprep     ?  ?Needles:  ?Injection technique: Single-shot ? ?Needle Type: Stimiplex   ? ? ? ? ? ? ? ?Additional Needles: ? ? ?Procedures:,,,, ultrasound used (permanent image in chart),,    ?Narrative:  ?Start time: 06/03/2021 11:47 AM ?End time: 06/03/2021 12:07 PM ?Injection made incrementally with aspirations every 5 mL. ? ?Performed by: Personally  ?Anesthesiologist: Nolon Nations, MD ? ?Additional Notes: ?BP cuff, SpO2 and EKG monitors applied. Sedation begun. Nerve location verified with ultrasound. Anesthetic injected incrementally, slowly, and after neg aspirations under direct u/s guidance. Good perineural spread. Tolerated well. ? ? ? ? ?

## 2021-06-03 NOTE — Progress Notes (Signed)
? ?[facility] ?PROGRESS NOTE ? ? ?Majour Frei ZJI:[id number] DOB: 09/08/1960 DOA: 05/29/2021  5 ?DOS: the patient was seen and examined on 06/03/2021 ? ?PCP: Rise Patience, DO ? ?Brief History and Hospital Course:  ?61 year old male with pertinent PMH of OSA (does not wear CPAP), HTN, CHF (2009 Echo EF 55-60%), CKD 4 followed by nephrology presented to [facility] ED on 4/7 with hypoxic respiratory failure.  Patient had experienced a 30 pound weight gain in the past few weeks prior to admission.  He was noted to have worsening renal function.  Thought to be fluid overloaded.  Noted to have hypertensive emergency.  Admitted initially to the intensive care unit.  Required Cleviprex infusion.  Blood pressure was stabilized.  Seen by nephrology and started on dialysis.  Stabilized and then transferred to hospitalist service.  Requiring intermittent hemodialysis.  Plan is for AV fistula placement by vascular surgery during this admission. ? ?Consultants: Nephrology, interventional radiology ? ?Procedures: Tunneled HD catheter placement in the right internal jugular, 4/8 ? ? ? ?Subjective: ?No complaints over this morning.  Waiting on his surgery.  Shortness of breath is improved.  Leg swelling is getting better.     ? ? ? ?Assessment/Plan: ? ?* Acute respiratory failure with hypoxia and hypercapnia (HCC) ?Patient was initially admitted to the intensive care unit.  Hypoxia was thought to be secondary to volume overload.  Patient was started on dialysis with improvement. ?Has been weaned off of oxygen.  Saturating in the 90s on room air. ? ?Volume overload ?Secondary to progression of renal disease.  Now being dialyzed. ? ?CKD (chronic kidney disease) stage 4, GFR 15-29 ml/min (HCC) ?See under acute kidney injury. ? ?AKI (acute kidney injury) ([place]) ?Has progressed to end-stage renal disease ? ?Nephrology is following.  Patient being dialyzed. ?Vascular surgery was consulted for access.  AV fistula to be placed during this  hospitalization. ?Patient to be established at outpatient dialysis clinic.   ?Patient also noted to be on furosemide.  ? ?Essential hypertension ?Initially required Cleviprex infusion in the ICU.   ?Blood pressure improved.  Transitioned to oral agents. ?Noted to be on amlodipine, carvedilol and furosemide. ?Echocardiogram shows normal systolic function. ?Occasional high readings noted.  Will not be too aggressive since he is now on dialysis. ? ?History of anemia due to chronic kidney disease ?Hemoglobin is low.  Started on Aranesp by nephrology.  No evidence of overt blood loss. ? ?Sleep apnea ?Not compliant with CPAP. ? ?Morbid obesity ([place]) ?Estimated body mass index is 49.65 kg/m? as calculated from the following: ?  Height as of this encounter: '5\' 9"'$  (1.753 m). ?  Weight as of this encounter: 152.5 kg. ? ? ? ?DVT Prophylaxis: Subcutaneous heparin ?Code Status: Full code ?Family Communication: Discussed with patient ?Disposition Plan: Hopefully return home when improved.  He lives with his parents.  Seen by physical therapy.  No follow-up necessary at this time ? ?Status is: Inpatient ?Remains inpatient appropriate because: Progression of kidney disease, requiring dialysis ? ? ? ? ?Medications: Scheduled: ? amLODipine  5 mg Oral Daily  ? calcitRIOL  1 mcg Oral Daily  ? carvedilol  12.5 mg Oral BID WC  ? chlorhexidine  15 mL Mouth Rinse BID  ? Chlorhexidine Gluconate Cloth  6 each Topical Q0600  ? darbepoetin (ARANESP) injection - DIALYSIS  150 mcg Intravenous Q Mon-HD  ? furosemide  80 mg Oral BID  ? heparin  5,000 Units Subcutaneous Q8H  ? lanthanum  500 mg Oral TID  WC  ? mouth rinse  15 mL Mouth Rinse q12n4p  ? ?Continuous: ? sodium chloride    ? sodium chloride    ? ?HYI:FOYDXA chloride, sodium chloride, acetaminophen, docusate sodium, heparin, hydrALAZINE, ipratropium-albuterol, lidocaine (PF), lidocaine-prilocaine, pentafluoroprop-tetrafluoroeth, polyethylene glycol ? ?Antibiotics: ?Anti-infectives (From  admission, onward)  ? ? Start     Dose/Rate Route Frequency Ordered Stop  ? 05/30/21 1200  ceFAZolin (ANCEF) IVPB 2g/100 mL premix  Status:  Discontinued       ? over 30 Minutes Intravenous Continuous PRN 05/30/21 1211 05/31/21 1430  ? 05/29/21 2200  cefTRIAXone (ROCEPHIN) 2 g in sodium chloride 0.9 % 100 mL IVPB  Status:  Discontinued       ? 2 g ?200 mL/hr over 30 Minutes Intravenous Every 24 hours 05/29/21 2158 05/30/21 0835  ? 05/29/21 2200  azithromycin (ZITHROMAX) 500 mg in sodium chloride 0.9 % 250 mL IVPB  Status:  Discontinued       ? 500 mg ?250 mL/hr over 60 Minutes Intravenous Every 24 hours 05/29/21 2158 05/30/21 0835  ? ?  ? ? ?Objective: ? ?Vital Signs ? ?Vitals:  ? 06/02/21 1633 06/02/21 1945 06/03/21 0555 06/03/21 0756  ?BP: 137/72 134/76 (!) 166/82 (!) 159/83  ?Pulse: 79 89 86 77  ?Resp: '15 20 18 '$ (!) 24  ?Temp: 98 ?F (36.7 ?C) 98 ?F (36.7 ?C) 98.4 ?F (36.9 ?C) 98.4 ?F (36.9 ?C)  ?TempSrc: Oral  Oral Oral  ?SpO2: 99% 96% 98% 92%  ?Weight:   (!) 152.4 kg   ?Height:      ? ? ?Intake/Output Summary (Last 24 hours) at 06/03/2021 0927 ?Last data filed at 06/03/2021 0500 ?Gross per 24 hour  ?Intake 400 ml  ?Output 3950 ml  ?Net -3550 ml  ? ? ?Filed Weights  ? 06/02/21 1050 06/02/21 1428 06/03/21 0555  ?Weight: (!) 151 kg (!) 148 kg (!) 152.4 kg  ? ? ?General appearance: Awake alert.  In no distress ?Resp: Clear to auscultation bilaterally.  Normal effort ?Cardio: S1-S2 is normal regular.  No S3-S4.  No rubs murmurs or bruit ?GI: Abdomen is soft.  Nontender nondistended.  Bowel sounds are present normal.  No masses organomegaly ?Extremities: Improving edema bilateral lower extremity ?Neurologic: Alert and oriented x3.  No focal neurological deficits.  ? ? ?Lab Results: ? ?Data Reviewed: I have personally reviewed labs and imaging study reports ? ?CBC: ?Recent Labs  ?Lab 05/29/21 ?1919 05/29/21 ?1938 05/30/21 ?1287 05/30/21 ?8676 05/31/21 ?0138 05/31/21 ?7209 06/01/21 ?0244 06/02/21 ?0319  ?WBC 12.2*  --  8.8   --  7.5 7.2 7.0 6.0  ?NEUTROABS 9.2*  --   --   --   --   --   --   --   ?HGB 9.5*   < > 8.1* 8.5* 7.5* 7.6* 7.2* 7.3*  ?HCT 30.3*   < > 25.0* 25.0* 23.2* 23.7* 21.9* 21.5*  ?MCV 95.0  --  92.3  --  91.0 91.5 90.1 88.1  ?PLT 274  --  227  --  212 214 199 207  ? < > = values in this interval not displayed.  ? ? ? ?Basic Metabolic Panel: ?Recent Labs  ?Lab 05/29/21 ?1919 05/29/21 ?1938 05/29/21 ?2027 05/29/21 ?2029 05/30/21 ?4709 05/30/21 ?6283 05/31/21 ?0138 06/01/21 ?0244 06/02/21 ?0319  ?NA 140   < > 139   < > 140 140 140 138 139  139  ?K 4.5   < > 4.5   < > 4.6 4.2 4.1 3.6 3.0*  3.0*  ?CL 105  --  109  --  103  --  105 104 106  104  ?CO2 19*  --   --   --  22  --  19* '22 24  23  '$ ?GLUCOSE 159*  --  155*  --  109*  --  95 113* 103*  101*  ?BUN 84*  --  92*  --  87*  --  95* 74* 50*  50*  ?CREATININE 11.88*  --  13.60*  --  11.85*  --  12.21* 10.19* 7.59*  7.59*  ?CALCIUM 7.8*  --   --   --  7.6*  --  7.7* 8.0* 8.1*  8.1*  ?MG  --   --   --   --  2.0  --  2.0 1.9  --   ?PHOS  --   --   --   --  8.2*  --  8.0*  --  4.3  ? < > = values in this interval not displayed.  ? ? ? ?GFR: ?Estimated Creatinine Clearance: 15.3 mL/min (A) (by C-G formula based on SCr of 7.59 mg/dL (H)). ? ?Liver Function Tests: ?Recent Labs  ?Lab 05/29/21 ?1919 05/30/21 ?5465 06/02/21 ?0319  ?AST 27  --   --   ?ALT 24  --   --   ?ALKPHOS 64  --   --   ?BILITOT 0.8  --   --   ?PROT 7.8  --   --   ?ALBUMIN 2.8* 2.4* 2.2*  ? ? ? ? ?CBG: ?Recent Labs  ?Lab 05/29/21 ?6812 05/30/21 ?7517 06/01/21 ?0759  ?GLUCAP 105* 107* 90  ? ? ? ?Recent Results (from the past 240 hour(s))  ?Resp Panel by RT-PCR (Flu A&B, Covid) Nasopharyngeal Swab     Status: None  ? Collection Time: 05/29/21  8:06 PM  ? Specimen: Nasopharyngeal Swab; Nasopharyngeal(NP) swabs in vial transport medium  ?Result Value Ref Range Status  ? SARS Coronavirus 2 by RT PCR NEGATIVE NEGATIVE Final  ?  Comment: (NOTE) ?SARS-CoV-2 target nucleic acids are NOT DETECTED. ? ?The SARS-CoV-2  RNA is generally detectable in upper respiratory ?specimens during the acute phase of infection. The lowest ?concentration of SARS-CoV-2 viral copies this assay can detect is ?138 copies/mL. A negative result d

## 2021-06-03 NOTE — Progress Notes (Signed)
Nutrition Brief Note ? ? ?RD received a consult for diet education for new HD patient.  ? ? ?Pt was out of room, getting fistula placed by vascular surgery at time of RD visit.  ?RD left "Nutrition for People on Dialysis" and "All About Protein for People on HD" in room for pt. RD will attempt to follow-up and review material prior to discharge.  ? ?Per EMR, pt has ate 100% of the previous 5 meal intakes recorded. ?Per weight history, pt has not had any significant weight loss within the past year.  ? ? ?No additional nutrition interventions needed at this time.  ? ? ? ?Hermina Barters RD, LDN ?Clinical Dietitian ?See AMiON for contact information.  ? ?

## 2021-06-03 NOTE — Anesthesia Postprocedure Evaluation (Signed)
Anesthesia Post Note ? ?Patient: Johnny [name] ? ?Procedure(s) Performed: LEFT RADIOCEPHALIC ARTERIOVENOUS (AV) FISTULA CREATION (Left: Arm Lower) ? ?  ? ?Patient location during evaluation: PACU ?Anesthesia Type: Regional ?Level of consciousness: awake and alert ?Pain management: pain level controlled ?Vital Signs Assessment: post-procedure vital signs reviewed and stable ?Respiratory status: spontaneous breathing ?Cardiovascular status: stable ?Anesthetic complications: no ? ? ?No notable events documented. ? ?Last Vitals:  ?Vitals:  ? 06/03/21 1215 06/03/21 1350  ?BP: (!) 170/99   ?Pulse: 78   ?Resp: 16   ?Temp:  36.9 ?C  ?SpO2: 98%   ?  ?Last Pain:  ?Vitals:  ? 06/03/21 1450  ?TempSrc:   ?PainSc: Asleep  ? ? ?  ?  ?  ?  ?  ?  ? ?Nolon Nations ? ? ? ? ?

## 2021-06-03 NOTE — Op Note (Signed)
? ? ?OPERATIVE NOTE ? ? ?PROCEDURE: left radiocephaic arteriovenous fistula placement ? ?PRE-OPERATIVE DIAGNOSIS: End stage renal disease ? ?POST-OPERATIVE DIAGNOSIS: same ? ?SURGEON: [name], MD ? ?ASSISTANT(S): Paulo Fruit, PA ? ?ANESTHESIA: regional ? ?ESTIMATED BLOOD LOSS: Minimal ? ?FINDING(S): ?1.  Cephalic vein: 4 mm, acceptable ?2.  Radial artery: 3 mm, atherosclerotic disease evident ?3.  Venous outflow: palpable thrill  ?4.  Radial flow: palpable radial pulse ? ?SPECIMEN(S):  none ? ?INDICATIONS:   ?Johnny [name] is a 61 y.o. male who presents with end stage renal disease and need for permanent hemodialysis access.  The patient is scheduled for left arteriovenous fistula placement.  The patient is aware the risks include but are not limited to: bleeding, infection, steal syndrome, nerve damage, ischemic monomelic neuropathy, failure to mature, and need for additional procedures.  The patient is aware of the risks of the procedure and elects to proceed forward.  An assistant was needed for exposure, to expedite the case, and to sew the anastomosis. ? ?DESCRIPTION: ?After full informed written consent was obtained from the patient, the patient was brought back to the operating room and placed supine upon the operating table.  Prior to induction, the patient received IV antibiotics.   After obtaining adequate anesthesia, the patient was then prepped and draped in the standard fashion for a left arm access procedure.  ? ?I turned my attention first to identifying the patient's distal cephalic vein and radial artery.  These looked to be be of good caliber.  Using SonoSite guidance, the location of these vessels were marked out on the skin.  I made a longitudinal incision at the level of the wrist and dissected through the subcutaneous tissue and fascia to gain exposure of the radial artery.  This was noted to be 3 mm in diameter externally.  This was dissected out proximally and distally and  controlled with vessel loops .  I then dissected out the cephalic vein.  This was noted to be 4 mm in diameter externally.  The distal segment of the vein was ligated with a  2-0 silk, and the vein was transected.  The proximal segment was interrogated with serial dilators.  The vein accepted up to a 4 mm dilator without any difficulty.  I then instilled the heparinized saline into the vein and clamped it.  At this point, I reset my exposure of the radial artery.  The patient was given 3,000 units IV heparin.  I then placed the artery under tension proximally and distally.  I made an arteriotomy with a #11 blade, and then I extended the arteriotomy with a Potts scissor.  I injected heparinized saline proximal and distal to this arteriotomy.  The vein was then sewn to the artery in an end-to-side configuration with a running stitch of 6-0 Prolene with the help of my assistant.  Prior to completing this anastomosis, I allowed the vein and artery to backbleed.  There was no evidence of clot from any vessels.  I completed the anastomosis in the usual fashion and then released all vessel loops and clamps.   ? ?There was a palpable thrill in the venous outflow, and there was a palpable radial pulse.  At this point, I irrigated out the surgical wound.  There was no further active bleeding.  The subcutaneous tissue was reapproximated with a running stitch of 3-0 Vicryl.  The skin was then reapproximated with a running subcuticular stitch of 4-0 Monocryl.  The skin was then cleaned, dried,  and reinforced with Dermabond.  The patient tolerated this procedure well.  ? ?COMPLICATIONS: None ? ?CONDITION: Stable ? ?Johnny Martinez, MD ?[facility] [place] ?Office: [phone] ? ?06/03/2021, 1:47 PM ? ?

## 2021-06-04 ENCOUNTER — Encounter (HOSPITAL_COMMUNITY): Payer: Medicaid Other

## 2021-06-04 ENCOUNTER — Other Ambulatory Visit (HOSPITAL_COMMUNITY): Payer: Self-pay

## 2021-06-04 ENCOUNTER — Encounter (HOSPITAL_COMMUNITY): Payer: Self-pay | Admitting: Vascular Surgery

## 2021-06-04 DIAGNOSIS — D631 Anemia in chronic kidney disease: Secondary | ICD-10-CM | POA: Diagnosis not present

## 2021-06-04 DIAGNOSIS — E8779 Other fluid overload: Secondary | ICD-10-CM | POA: Diagnosis not present

## 2021-06-04 DIAGNOSIS — N186 End stage renal disease: Secondary | ICD-10-CM | POA: Diagnosis not present

## 2021-06-04 DIAGNOSIS — N2581 Secondary hyperparathyroidism of renal origin: Secondary | ICD-10-CM | POA: Diagnosis not present

## 2021-06-04 DIAGNOSIS — N185 Chronic kidney disease, stage 5: Secondary | ICD-10-CM | POA: Diagnosis not present

## 2021-06-04 DIAGNOSIS — I120 Hypertensive chronic kidney disease with stage 5 chronic kidney disease or end stage renal disease: Secondary | ICD-10-CM | POA: Diagnosis not present

## 2021-06-04 DIAGNOSIS — J9601 Acute respiratory failure with hypoxia: Secondary | ICD-10-CM | POA: Diagnosis not present

## 2021-06-04 LAB — BASIC METABOLIC PANEL
Anion gap: 10 (ref 5–15)
BUN: 38 mg/dL — ABNORMAL HIGH (ref 6–20)
CO2: 24 mmol/L (ref 22–32)
Calcium: 8.1 mg/dL — ABNORMAL LOW (ref 8.9–10.3)
Chloride: 103 mmol/L (ref 98–111)
Creatinine, Ser: 7.54 mg/dL — ABNORMAL HIGH (ref 0.61–1.24)
GFR, Estimated: 8 mL/min — ABNORMAL LOW (ref >60)
Glucose, Bld: 95 mg/dL (ref 70–99)
Potassium: 3.9 mmol/L (ref 3.5–5.1)
Sodium: 137 mmol/L (ref 135–145)

## 2021-06-04 LAB — CBC
HCT: 22.7 % — ABNORMAL LOW (ref 39.0–52.0)
Hemoglobin: 7.5 g/dL — ABNORMAL LOW (ref 13.0–17.0)
MCH: 30.7 pg (ref 26.0–34.0)
MCHC: 33 g/dL (ref 30.0–36.0)
MCV: 93 fL (ref 80.0–100.0)
Platelets: 205 10*3/uL (ref 150–400)
RBC: 2.44 MIL/uL — ABNORMAL LOW (ref 4.22–5.81)
RDW: 14.5 % (ref 11.5–15.5)
WBC: 5.4 10*3/uL (ref 4.0–10.5)
nRBC: 0 % (ref 0.0–0.2)

## 2021-06-04 MED ORDER — AMLODIPINE BESYLATE 5 MG PO TABS
5.0000 mg | ORAL_TABLET | Freq: Every day | ORAL | 0 refills | Status: DC
  Filled 2021-06-04: qty 30, 30d supply, fill #0

## 2021-06-04 MED ORDER — FUROSEMIDE 80 MG PO TABS
80.0000 mg | ORAL_TABLET | Freq: Two times a day (BID) | ORAL | 0 refills | Status: DC
  Filled 2021-06-04: qty 60, 30d supply, fill #0

## 2021-06-04 MED ORDER — CARVEDILOL 12.5 MG PO TABS
12.5000 mg | ORAL_TABLET | Freq: Two times a day (BID) | ORAL | 0 refills | Status: AC
  Filled 2021-06-04: qty 60, 30d supply, fill #0

## 2021-06-04 MED ORDER — CALCITRIOL 0.5 MCG PO CAPS
1.0000 ug | ORAL_CAPSULE | Freq: Every day | ORAL | 0 refills | Status: DC
  Filled 2021-06-04: qty 30, 15d supply, fill #0

## 2021-06-04 MED ORDER — LANTHANUM CARBONATE 500 MG PO CHEW
500.0000 mg | CHEWABLE_TABLET | Freq: Three times a day (TID) | ORAL | 0 refills | Status: DC
  Filled 2021-06-04: qty 90, 30d supply, fill #0

## 2021-06-04 NOTE — Plan of Care (Signed)
?  Problem: Education: ?Goal: Knowledge of General Education information will improve ?Description: Including pain rating scale, medication(s)/side effects and non-pharmacologic comfort measures ?Outcome: Progressing ?  ?Problem: Health Behavior/Discharge Planning: ?Goal: Ability to manage health-related needs will improve ?Outcome: Progressing ?  ?Problem: Clinical Measurements: ?Goal: Will remain free from infection ?Outcome: Progressing ?Goal: Diagnostic test results will improve ?Outcome: Progressing ?Goal: Respiratory complications will improve ?Outcome: Progressing ?Goal: Cardiovascular complication will be avoided ?Outcome: Progressing ?  ?Problem: Activity: ?Goal: Risk for activity intolerance will decrease ?Outcome: Progressing ?  ?Problem: Coping: ?Goal: Level of anxiety will decrease ?Outcome: Progressing ?  ?Problem: Pain Managment: ?Goal: General experience of comfort will improve ?Outcome: Progressing ?  ?Problem: Safety: ?Goal: Ability to remain free from injury will improve ?Outcome: Progressing ?  ?

## 2021-06-04 NOTE — Progress Notes (Signed)
Met with pt at bedside this morning while pt receiving HD. Pt made aware that clinic does not have an earlier appt at this time. Pt advised that he can discuss with clinic staff his desire for an earlier appt when one becomes available. Pt provided schedule letter. Discussed again pt's clinic and schedule Emilie Rutter on MWF with 11:50 chair time). Pt also aware that he needs to arrive at clinic tomorrow at 10:45 to complete paperwork prior to treatment. Pt voices understanding and agreeable to plan. Pt's out-pt HD arrangements have been documented on AVS. Renal PA aware of clinic's need for orders. ? ?Melven Sartorius ?Renal Navigator ?[phone] ?

## 2021-06-04 NOTE — TOC Transition Note (Addendum)
Transition of Care (TOC) - CM/SW Discharge Note ? ? ?Patient Details  ?Name: Johnny [name] ?MRN: [id number] ?Date of Birth: 1960-02-24 ? ?Transition of Care (TOC) CM/SW Contact:  ?Zenon Mayo, RN ?Phone Number: ?06/04/2021, 1:14 PM ? ? ?Clinical Narrative:    ?Patient was transfer from 87M,  patient from home alone. S/p AV fistula yesterday. Plan is for dc home today.  He states his daughter will be transporting him home. He has hospital follow apt on AVS, also going home with leg bag and will follow up with Dr. Milford Cage urologist which is on AVS. Per Staff RN he does not need a HHRN, she showed him how to care for the leg bag foley. ?  ? ? ?Final next level of care: Home/Self Care ?Barriers to Discharge: No Barriers Identified ? ? ?Patient Goals and CMS Choice ?Patient states their goals for this hospitalization and ongoing recovery are:: return home ?  ?Choice offered to / list presented to : NA ? ?Discharge Placement ?  ?           ?  ?  ?  ?  ? ?Discharge Plan and Services ?  ?Discharge Planning Services: CM Consult ?Post Acute Care Choice: NA          ?  ?DME Agency: NA ?  ?  ?  ?HH Arranged: NA ?  ?  ?  ?  ? ?Social Determinants of Health (SDOH) Interventions ?  ? ? ?Readmission Risk Interventions ? ?  06/04/2021  ?  1:10 PM  ?Readmission Risk Prevention Plan  ?Transportation Screening Complete  ?PCP or Specialist Appt within 3-5 Days Complete  ?[place] or Home Care Consult Complete  ?Social Work Consult for [place] Planning/Counseling Complete  ?Palliative Care Screening Not Applicable  ?Medication Review Press photographer) Complete  ? ? ? ? ? ?

## 2021-06-04 NOTE — Consult Note (Signed)
? ?  [facility] CM Inpatient Consult ? ? ?06/04/2021 ? ?Johnny [name] ?Apr 17, 1960 ?[id number] ? ?Managed Medicaid: Healthy Blue ? ?High Risk ? ?Primary Care Provider: Rise Patience, DO, [facility] is Embedded this provider office is listed for the [facility] calls and follow up ? ?Spoke with patient regarding follow up in the Managed Medicaid plan.  Patient endorses ongoing needs and states that the MM team can best reach him by his cell phone.  Phone number confirmed as in EMR demographics. ? ?Plan:  Will make referral to the MM team for post hospital support for high risk Medicaid. ? ?For questions, please contact: ? ?Natividad Brood, RN BSN CCM ?[facility] Liaison ? [phone] business mobile phone ?Toll free office [phone]  ?Fax number: [phone] ?[email or web address] ? ? ? ? ?

## 2021-06-04 NOTE — Progress Notes (Signed)
Pt back from dialysis,continues to have gross amount scrotal swelling, penis is not visible nor able to "pull it out",  the foley cath is inside of what appears like a slit in his scrotum.   ?Dr Maryland Pink has ordered to discontinue foley cath, concern that pt will not be able to void if foley removed due to all the swelling and not able to get to his penis.    ?Dr Maryland Pink notified of concern, orders placed, pt will be keeping the foley cath, changing over to a leg bag. Pt will be following up with urology as an outpatient per MD.    ?Bag change and education to be done before pt will be ready for discharge.   Pt informed of plan.  ?

## 2021-06-04 NOTE — Progress Notes (Signed)
Nursing dc note ? ?Patient alert and oriented. Verbalized understadning of dc instructions. Demonstrated how to care for leg foley. Education done by [employer]. All belongings given to patient.  ?

## 2021-06-04 NOTE — Progress Notes (Signed)
removed 4052ms net fluid no complaints no complications gave calcitriol as ordered. catheter packed with heparin clamped and capped. ran well. pre bp 156/78 post bp 136/71  pre weight 144.5kg post weight 140.00kg bed scales. ?

## 2021-06-04 NOTE — Progress Notes (Signed)
OT Cancellation Note ? ?Patient Details ?Name: Johnny [name] ?MRN: [id number] ?DOB: [birth year] ? ? ?Cancelled Treatment:    Reason Eval/Treat Not Completed: Patient at procedure or test/ unavailable;Other (comment) (Pt off floor at HD. Will attempt OT treatment as schedule allows. Handouts for energy conservation and a/e left in room, to be reviewed during next session for OT) ? ?Carlynn Herald Caretha Rumbaugh Beth Dixon, OTR/L ?06/04/2021, 8:12 AM ?

## 2021-06-04 NOTE — TOC Progression Note (Signed)
Transition of Care (TOC) - Progression Note  ? ? ?Patient Details  ?Name: Johnny [name] ?MRN: [id number] ?Date of Birth: Oct 17, 1960 ? ?Transition of Care (TOC) CM/SW Contact  ?Zenon Mayo, RN ?Phone Number: ?06/04/2021, 10:04 AM ? ?Clinical Narrative:    ? ?From home, new ESRD , sp AV fistula yesterday.  Renal Navigator has clipped patient.  He is for possible dc today.  TOC will continue to follow. ? ?  ?  ? ?Expected Discharge Plan and Services ?  ?  ?  ?  ?  ?                ?  ?  ?  ?  ?  ?  ?  ?  ?  ?  ? ? ?Social Determinants of Health (SDOH) Interventions ?  ? ?Readmission Risk Interventions ?   ? View : No data to display.  ?  ?  ?  ? ? ?

## 2021-06-04 NOTE — Progress Notes (Addendum)
Pt educated on use of foley leg bag, how to attach to leg, and how to empty. Pt able to return demonstration. Pt has an appointment with urology MD on May 4, at 1015.  Written in on pt's d/c instructions. Extra bag provided in case needed.  ?

## 2021-06-04 NOTE — Procedures (Signed)
I was present at this dialysis session. I have reviewed the session itself and made appropriate changes.  ? ?Filed Weights  ? 06/03/21 1125 06/04/21 0637 06/04/21 0702  ?Weight: (!) 152.2 kg (!) 146.4 kg (!) 144.5 kg  ? ? ?Recent Labs  ?Lab 06/02/21 ?7619 06/03/21 ?1151 06/04/21 ?5093  ?NA 139  139   < > 137  ?K 3.0*  3.0*   < > 3.9  ?CL 106  104   < > 103  ?CO2 24  23  --  24  ?GLUCOSE 103*  101*   < > 95  ?BUN 50*  50*   < > 38*  ?CREATININE 7.59*  7.59*   < > 7.54*  ?CALCIUM 8.1*  8.1*  --  8.1*  ?PHOS 4.3  --   --   ? < > = values in this interval not displayed.  ? ? ?Recent Labs  ?Lab 05/29/21 ?1919 05/29/21 ?1938 06/01/21 ?0244 06/02/21 ?2671 06/03/21 ?1151 06/04/21 ?2458  ?WBC 12.2*   < > 7.0 6.0  --  5.4  ?NEUTROABS 9.2*  --   --   --   --   --   ?HGB 9.5*   < > 7.2* 7.3* 7.1* 7.5*  ?HCT 30.3*   < > 21.9* 21.5* 21.0* 22.7*  ?MCV 95.0   < > 90.1 88.1  --  93.0  ?PLT 274   < > 199 207  --  205  ? < > = values in this interval not displayed.  ? ? ?Scheduled Meds: ? amLODipine  5 mg Oral Daily  ? calcitRIOL  1 mcg Oral Daily  ? carvedilol  12.5 mg Oral BID WC  ? chlorhexidine  15 mL Mouth Rinse BID  ? Chlorhexidine Gluconate Cloth  6 each Topical Q0600  ? darbepoetin (ARANESP) injection - DIALYSIS  150 mcg Intravenous Q Mon-HD  ? furosemide  80 mg Oral BID  ? heparin  5,000 Units Subcutaneous Q8H  ? lanthanum  500 mg Oral TID WC  ? mouth rinse  15 mL Mouth Rinse q12n4p  ? ?Continuous Infusions: ? sodium chloride    ? sodium chloride    ? ?PRN Meds:.sodium chloride, sodium chloride, acetaminophen, docusate sodium, heparin, hydrALAZINE, ipratropium-albuterol, lidocaine (PF), lidocaine-prilocaine, pentafluoroprop-tetrafluoroeth, polyethylene glycol   ?Santiago Bumpers,  MD ?06/04/2021, 9:02 AM ? ? ?

## 2021-06-04 NOTE — Discharge Summary (Addendum)
?[facility] ? ?Physician Discharge Summary  ? ?Patient ID: ?Johnny [name] ?MRN: [id number] ?DOB/AGE: 1961-02-08 61 y.o. ? ?Admit date: 05/29/2021 ?Discharge date:   06/04/2021 ? ? ?PCP: Rise Patience, DO ? ?DISCHARGE DIAGNOSES:  ?Principal Problem: ?  Acute respiratory failure with hypoxia and hypercapnia ([place]) ?Active Problems: ?  Volume overload ?  ESRD (end stage renal disease) ([place]) ?  CKD (chronic kidney disease) stage 4, GFR 15-29 ml/min (HCC) ?  Essential hypertension ?  History of anemia due to chronic kidney disease ?  Sleep apnea ?  Morbid obesity ([place]) ? ? ?RECOMMENDATIONS FOR OUTPATIENT FOLLOW UP: ?Patient to start outpatient dialysis tomorrow.  He has been given the pertinent information. ? ?Home Health: None ?Equipment/Devices: None ? ?CODE STATUS: Full code ? ?DISCHARGE CONDITION: fair ? ?Diet recommendation: Low-sodium ? ?INITIAL HISTORY: ?61 year old male with pertinent PMH of OSA (does not wear CPAP), HTN, CHF (2009 Echo EF 55-60%), CKD 4 followed by nephrology presented to [facility] ED on 4/7 with hypoxic respiratory failure.  Patient had experienced a 30 pound weight gain in the past few weeks prior to admission.  He was noted to have worsening renal function.  Thought to be fluid overloaded.  Noted to have hypertensive emergency.  Admitted initially to the intensive care unit.  Required Cleviprex infusion.  Blood pressure was stabilized.  Seen by nephrology and started on dialysis.  Stabilized and then transferred to hospitalist service.  Requiring intermittent hemodialysis.  Underwent AV fistula creation by vascular surgery.  Has been established at outpatient dialysis center. ? ?Consultants: Nephrology, interventional radiology.  Vascular surgery ?  ?Procedures:  ?Tunneled HD catheter placement in the right internal jugular, 4/8 ?left radiocephaic arteriovenous fistula placement 4/12 ? ?HOSPITAL COURSE:  ? ? ?* Acute respiratory failure with hypoxia and hypercapnia (HCC) ?Patient was initially  admitted to the intensive care unit.  Hypoxia was thought to be secondary to volume overload.  Patient was started on dialysis with improvement. ?Has been weaned off of oxygen.  Saturating in the 90s on room air. ? ?Volume overload ?Secondary to progression of renal disease.  Now being dialyzed.  Volume status has improved.  Still has edema in his lower extremities and scrotum however. ? ?CKD (chronic kidney disease) stage 4, GFR 15-29 ml/min (HCC) ?See under acute kidney injury. ? ?ESRD (end stage renal disease) ([place]) ?Nephrology was consulted.  Patient started on dialysis.   ?Vascular surgery was consulted for access.  Underwent AV fistula creation on 4/12 ?Has been established at outpatient dialysis center starting tomorrow. ?Discussed with nephrology.  Okay for discharge today after hemodialysis ?He has a Foley catheter which was placed for strict ins and outs.  Plan was to discontinue the Foley catheter prior to discharge.  However patient continues to have significant scrotal edema.  His penis is barely visible due to this significant edema.  Discussed with Dr. Milford Cage with urology who recommends leaving the catheter in for now and do a voiding trial once the swelling has subsided.  He will be changed over to a leg bag.  Follow-up with urology in 3 weeks.  Unit secretary to make the appointment ?Lasix to continue. ? ?Essential hypertension ?Initially required Cleviprex infusion in the ICU.   ?Blood pressure improved.  Transitioned to oral agents. ?Noted to be on amlodipine, carvedilol and furosemide. ?Echocardiogram shows normal systolic function. ?Occasional high readings noted.  Will not be too aggressive since he is now on dialysis. ? ?History of anemia due to chronic kidney disease ?Hemoglobin is low.  Started on Aranesp by nephrology.  No evidence of overt blood loss. ? ?Sleep apnea ?Not compliant with CPAP. ? ?Morbid obesity ([place]) ?Estimated body mass index is 49.65 kg/m? as calculated from the  following: ?  Height as of this encounter: '5\' 9"'$  (1.753 m). ?  Weight as of this encounter: 152.5 kg. ? ? ? ?Patient is stable.  Voiding trial today.  Okay for discharge later today after dialysis and voiding trial. ? ? ?PERTINENT LABS: ? ?The results of significant diagnostics from this hospitalization (including imaging, microbiology, ancillary and laboratory) are listed below for reference.   ? ?Microbiology: ?Recent Results (from the past 240 hour(s))  ?Resp Panel by RT-PCR (Flu A&B, Covid) Nasopharyngeal Swab     Status: None  ? Collection Time: 05/29/21  8:06 PM  ? Specimen: Nasopharyngeal Swab; Nasopharyngeal(NP) swabs in vial transport medium  ?Result Value Ref Range Status  ? SARS Coronavirus 2 by RT PCR NEGATIVE NEGATIVE Final  ?  Comment: (NOTE) ?SARS-CoV-2 target nucleic acids are NOT DETECTED. ? ?The SARS-CoV-2 RNA is generally detectable in upper respiratory ?specimens during the acute phase of infection. The lowest ?concentration of SARS-CoV-2 viral copies this assay can detect is ?138 copies/mL. A negative result does not preclude SARS-Cov-2 ?infection and should not be used as the sole basis for treatment or ?other patient management decisions. A negative result may occur with  ?improper specimen collection/handling, submission of specimen other ?than nasopharyngeal swab, presence of viral mutation(s) within the ?areas targeted by this assay, and inadequate number of viral ?copies(<138 copies/mL). A negative result must be combined with ?clinical observations, patient history, and epidemiological ?information. The expected result is Negative. ? ?Fact Sheet for Patients:  ?EntrepreneurPulse.com.au ? ?Fact Sheet for Healthcare Providers:  ?IncredibleEmployment.be ? ?This test is no t yet approved or cleared by the [place] FDA and  ?has been authorized for detection and/or diagnosis of SARS-CoV-2 by ?FDA under an Emergency Use Authorization (EUA). This EUA will  remain  ?in effect (meaning this test can be used) for the duration of the ?COVID-19 declaration under Section 564(b)(1) of the Act, 21 ?U.S.C.section 360bbb-3(b)(1), unless the authorization is terminated  ?or revoked sooner.  ? ? ?  ? Influenza A by PCR NEGATIVE NEGATIVE Final  ? Influenza B by PCR NEGATIVE NEGATIVE Final  ?  Comment: (NOTE) ?The Xpert Xpress SARS-CoV-2/FLU/RSV plus assay is intended as an aid ?in the diagnosis of influenza from Nasopharyngeal swab specimens and ?should not be used as a sole basis for treatment. Nasal washings and ?aspirates are unacceptable for Xpert Xpress SARS-CoV-2/FLU/RSV ?testing. ? ?Fact Sheet for Patients: ?EntrepreneurPulse.com.au ? ?Fact Sheet for Healthcare Providers: ?IncredibleEmployment.be ? ?This test is not yet approved or cleared by the [place] FDA and ?has been authorized for detection and/or diagnosis of SARS-CoV-2 by ?FDA under an Emergency Use Authorization (EUA). This EUA will remain ?in effect (meaning this test can be used) for the duration of the ?COVID-19 declaration under Section 564(b)(1) of the Act, 21 U.S.C. ?section 360bbb-3(b)(1), unless the authorization is terminated or ?revoked. ? ?Performed at [facility], [place] [address], [place], [place] ?[address] ?  ?MRSA Next Gen by PCR, Nasal     Status: None  ? Collection Time: 05/29/21  9:59 PM  ? Specimen: Nasal Mucosa; Nasal Swab  ?Result Value Ref Range Status  ? MRSA by PCR Next Gen NOT DETECTED NOT DETECTED Final  ?  Comment: (NOTE) ?The GeneXpert MRSA Assay (FDA approved for NASAL specimens only), ?is  one component of a comprehensive MRSA colonization surveillance ?program. It is not intended to diagnose MRSA infection nor to guide ?or monitor treatment for MRSA infections. ?Test performance is not FDA approved in patients less than 2 years ?old. ?Performed at [facility], [place] [address] [place], [place] ?[address] ?  ?Culture, blood (routine x  2)     Status: None  ? Collection Time: 05/29/21 10:00 PM  ? Specimen: BLOOD RIGHT FOREARM  ?Result Value Ref Range Status  ? Specimen Description BLOOD RIGHT FOREARM  Final  ? Special Requests   Final  ?  BO

## 2021-06-04 NOTE — TOC Initial Note (Signed)
Transition of Care (TOC) - Initial/Assessment Note  ? ? ?Patient Details  ?Name: Johnny [name] ?MRN: [id number] ?Date of Birth: August 06, 1960 ? ?Transition of Care (TOC) CM/SW Contact:    ?Zenon Mayo, RN ?Phone Number: ?06/04/2021, 1:13 PM ? ?Clinical Narrative:                 ?Patient was transfer from 38M,  patient from home alone. S/p AV fistula yesterday. Plan is for dc home today.  He states his daughter will be transporting him home.  ? ?Expected Discharge Plan: Home/Self Care ?Barriers to Discharge: No Barriers Identified ? ? ?Patient Goals and CMS Choice ?Patient states their goals for this hospitalization and ongoing recovery are:: return home ?  ?Choice offered to / list presented to : NA ? ?Expected Discharge Plan and Services ?Expected Discharge Plan: Home/Self Care ?  ?Discharge Planning Services: CM Consult ?Post Acute Care Choice: NA ?Living arrangements for the past 2 months: [place] ?Expected Discharge Date: 06/04/21               ?  ?DME Agency: NA ?  ?  ?  ?HH Arranged: NA ?  ?  ?  ?  ? ?Prior Living Arrangements/Services ?Living arrangements for the past 2 months: [place] ?Lives with:: Self ?Patient language and need for interpreter reviewed:: Yes ?Do you feel safe going back to the place where you live?: Yes      ?Need for Family Participation in Patient Care: Yes (Comment) ?Care giver support system in place?: Yes (comment) ?  ?Criminal Activity/Legal Involvement Pertinent to Current Situation/Hospitalization: No - Comment as needed ? ?Activities of Daily Living ?Home Assistive Devices/Equipment: Dentures (specify type) ?ADL Screening (condition at time of admission) ?Patient's cognitive ability adequate to safely complete daily activities?: Yes ?Is the patient deaf or have difficulty hearing?: No ?Does the patient have difficulty seeing, even when wearing glasses/contacts?: No ?Does the patient have difficulty concentrating, remembering, or making decisions?:  No ?Patient able to express need for assistance with ADLs?: Yes ?Does the patient have difficulty dressing or bathing?: Yes ?Independently performs ADLs?: No ?Communication: Independent ?Dressing (OT): Independent ?Grooming: Independent ?Feeding: Independent ?Bathing: Needs assistance ?Is this a change from baseline?: Change from baseline, expected to last <3 days ?Toileting: Needs assistance ?Is this a change from baseline?: Change from baseline, expected to last <3 days ?In/Out Bed: Needs assistance ?Is this a change from baseline?: Change from baseline, expected to last <3 days ?Walks in Home: Independent ?Does the patient have difficulty walking or climbing stairs?: Yes ?Weakness of Legs: Both ?Weakness of Arms/Hands: Both ? ?Permission Sought/Granted ?  ?  ?   ?   ?   ?   ? ?Emotional Assessment ?Appearance:: Appears stated age ?Attitude/Demeanor/Rapport: Engaged ?Affect (typically observed): Appropriate ?Orientation: : Oriented to Situation, Oriented to  Time, Oriented to Place, Oriented to Self ?  ?Psych Involvement: No (comment) ? ?Admission diagnosis:  Flash pulmonary edema ([place]) [J81.0] ?Acute respiratory failure with hypercapnia ([place]) [J96.02] ?Acute renal failure, unspecified acute renal failure type ([place]) [N17.9] ?Patient Active Problem List  ? Diagnosis Date Noted  ? Volume overload 06/01/2021  ? Acute respiratory failure with hypoxia and hypercapnia ([place]) 05/29/2021  ? ESRD (end stage renal disease) ([place])   ? CKD (chronic kidney disease) stage 4, GFR 15-29 ml/min (HCC)   ? Elevated brain natriuretic peptide (BNP) level   ? Acute on chronic congestive heart failure ([place])   ? Acute pulmonary edema (HCC)   ?  Tubular adenoma of Colon 07/22/2020  ? Iron deficiency anemia due to chronic blood loss 06/17/2020  ? Morbid obesity ([place]) 12/31/2019  ? Gastrointestinal hemorrhage 06/11/2019  ? Normocytic anemia   ? 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 Pannus, abdominal 12/28/2017  ? Acquired lymphedema, bilateral thighs  12/13/2017  ? Scrotal mass 12/13/2017  ? History of anemia due to chronic kidney disease   ? Hyperkalemia 08/27/2015  ? Sleep apnea 05/21/2013  ? Diverticulosis of colon 01/31/2012  ? Venous stasis of lower extremity 12/02/2011  ? LVH (left ventricular hypertrophy) 07/28/2009  ? CKD stage 4 secondary to hypertension ([place]) 07/28/2009  ? Essential hypertension 04/21/2006  ? ?PCP:  Rise Patience, DO ?Pharmacy:   ?[place] ([place]), [place] - [address] ?[address] ?[place] [address] ?Phone: [phone] Fax: [phone] ? ?[place] ([place]), [place] ?[place] ?[place] [address] ?Phone: [phone] Fax: [phone] ? ?[facility] ?[address] ?[place] [address] ?Phone: [phone] Fax: [phone] ? ? ? ? ?Social Determinants of Health (SDOH) Interventions ?  ? ?Readmission Risk Interventions ? ?  06/04/2021  ?  1:10 PM  ?Readmission Risk Prevention Plan  ?Transportation Screening Complete  ?PCP or Specialist Appt within 3-5 Days Complete  ?[place] or Home Care Consult Complete  ?Social Work Consult for [place] Planning/Counseling Complete  ?Palliative Care Screening Not Applicable  ?Medication Review Press photographer) Complete  ? ? ? ?

## 2021-06-04 NOTE — Progress Notes (Addendum)
?  Progress Note ? ? ? ?06/04/2021 ?7:54 AM ?1 Day Post-Op ? ?Subjective:  no complaints ? ? ?Vitals:  ? 06/04/21 0630 06/04/21 0726  ?BP: 137/74 (!) 156/78  ?Pulse: 84 79  ?Resp: 20 (!) 28  ?Temp: 98.6 ?F (37 ?C) 98.5 ?F (36.9 ?C)  ?SpO2: 91%   ? ?Physical Exam: ?Lungs:  non labored ?Incisions:  L wrist incision c/d/i ?Extremities:  palpable thrill L forearm; grip strength symmetrical ?Neurologic: A&O ? ?CBC ?   ?Component Value Date/Time  ? WBC 5.4 06/04/2021 0049  ? RBC 2.44 (L) 06/04/2021 0049  ? HGB 7.5 (L) 06/04/2021 0049  ? HGB 9.7 (L) 12/31/2019 1024  ? HCT 22.7 (L) 06/04/2021 0049  ? HCT 30.9 (L) 12/31/2019 1024  ? PLT 205 06/04/2021 0049  ? PLT 249 12/31/2019 1024  ? MCV 93.0 06/04/2021 0049  ? MCV 81 12/31/2019 1024  ? MCH 30.7 06/04/2021 0049  ? MCHC 33.0 06/04/2021 0049  ? RDW 14.5 06/04/2021 0049  ? RDW 17.8 (H) 12/31/2019 1024  ? LYMPHSABS 1.9 05/29/2021 1919  ? MONOABS 0.8 05/29/2021 1919  ? EOSABS 0.2 05/29/2021 1919  ? BASOSABS 0.0 05/29/2021 1919  ? ? ?BMET ?   ?Component Value Date/Time  ? NA 137 06/04/2021 0049  ? NA 138 12/31/2019 1024  ? K 3.9 06/04/2021 0049  ? CL 103 06/04/2021 0049  ? CO2 24 06/04/2021 0049  ? GLUCOSE 95 06/04/2021 0049  ? BUN 38 (H) 06/04/2021 0049  ? BUN 60 (H) 12/31/2019 1024  ? CREATININE 7.54 (H) 06/04/2021 0049  ? CREATININE 2.94 (H) 09/04/2015 1035  ? CALCIUM 8.1 (L) 06/04/2021 0049  ? GFRNONAA 8 (L) 06/04/2021 0049  ? GFRNONAA 23 (L) 09/04/2015 1035  ? GFRAA 18 (L) 12/31/2019 1024  ? GFRAA 27 (L) 09/04/2015 1035  ? ? ?INR ?   ?Component Value Date/Time  ? INR 1.2 06/12/2019 0743  ? ? ? ?Intake/Output Summary (Last 24 hours) at 06/04/2021 0754 ?Last data filed at 06/04/2021 0600 ?Gross per 24 hour  ?Intake 1358 ml  ?Output 935 ml  ?Net 423 ml  ? ? ? ?Assessment/Plan:  61 y.o. male is s/p L radiocephalic fistula 1 Day Post-Op  ? ?Patent L arm fistula without signs or symptoms of steal ?Ok for discharge from vascular standpoint ?Office will call to arrange fistula duplex  in 5-6 weeks ? ? ?Dagoberto Ligas, PA-C ?[facility] ?[phone] ?06/04/2021 ?7:54 AM ? ?I have seen and evaluated the patient. I agree with the PA note as documented above.  Postop day 1 status post left radiocephalic AV fistula.  Excellent thrill in the fistula.  Palpable radial pulse distal to the incision.  No signs of steal.  We will arrange follow-up in the office in 4 to 6 weeks with fistula duplex.  Discussed this will need 3 months to mature.  Call vascular with questions or concerns.  We will sign off. ? ?[name], MD ?[facility] ?Office: [phone] ? ? ?

## 2021-06-04 NOTE — Progress Notes (Signed)
?[facility] ?Progress Note  ? ?Assessment/ Plan:   ?1.  Acute hypoxic respiratory failure: CAP + Vol overload ?-Status post azithro/ CTX ?- some response to IV diuretics but not adequate ?- continuing HD as below ?- will continue some lasix to assist with volume removal ? ?2.  Chronic kidney disease stage V now ESRD:  ?- not a lot of response to high- dose Lasix Initially ?- likely now ESRD;  ?-s/p AVF creation on 4/12 w/ Dr. Carlis Abbott appreciate help ?-[facility] starting tomorrow ?-HD today for volume and plan to DC after ? ?3.  Anemia:  ?- s/p iv iron, continue aranesp 165mg weekly (started 4/10); hgb slowly improving ? ?4.  Hypertension:  ?- BP improving.  Continue current medications ? ?5.  Secondary hyperparathyroidism: Restart calcitriol for PTH control and begin phosphorus binder for hyperphosphatemia. ? ?Subjective:   ? ?Patient states he feels well today.  Denies shortness of breath or chest pain.  Tolerating dialysis.  Planning to discharge today  ? ?Objective:   ?BP (!) 156/78   Pulse 79   Temp 98.5 ?F (36.9 ?C) (Oral)   Resp (!) 28   Ht '5\' 9"'$  (1.753 m)   Wt (!) 144.5 kg   SpO2 91%   BMI 47.04 kg/m?  ? ?Physical Exam: ?GJXB:JYNWGin bed, nad ?CVS: normal rate, no rub ?Resp: bilateral crackles. Mild iwob ?Abd: soft, + abd wall edema, mild distention ?Ext: 2+ anasarca are all improved, warm and well perfused ?Psych: mood and affect appropriate ? ?Labs: ?BMET ?Recent Labs  ?Lab 05/29/21 ?1919 05/29/21 ?1938 05/29/21 ?2027 05/29/21 ?2029 05/30/21 ?0956204/08/23 ?0130804/09/23 ?0138 06/01/21 ?0244 06/02/21 ?0657804/12/23 ?1151 06/04/21 ?04696 ?NA 140   < > 139   < > 140 140 140 138 139  139 139 137  ?K 4.5   < > 4.5   < > 4.6 4.2 4.1 3.6 3.0*  3.0* 3.7 3.9  ?CL 105  --  109  --  103  --  105 104 106  104 105 103  ?CO2 19*  --   --   --  22  --  19* '22 24  23  '$ --  24  ?GLUCOSE 159*  --  155*  --  109*  --  95 113* 103*  101* 95 95  ?BUN 84*  --  92*  --   87*  --  95* 74* 50*  50* 41* 38*  ?CREATININE 11.88*  --  13.60*  --  11.85*  --  12.21* 10.19* 7.59*  7.59* 7.60* 7.54*  ?CALCIUM 7.8*  --   --   --  7.6*  --  7.7* 8.0* 8.1*  8.1*  --  8.1*  ?PHOS  --   --   --   --  8.2*  --  8.0*  --  4.3  --   --   ? < > = values in this interval not displayed.  ? ?CBC ?Recent Labs  ?Lab 05/29/21 ?1919 05/29/21 ?1938 05/31/21 ?0295204/10/23 ?0244 06/02/21 ?0841304/12/23 ?1151 06/04/21 ?02440 ?WBC 12.2*   < > 7.2 7.0 6.0  --  5.4  ?NEUTROABS 9.2*  --   --   --   --   --   --   ?HGB 9.5*   < > 7.6* 7.2* 7.3* 7.1* 7.5*  ?HCT 30.3*   < > 23.7* 21.9* 21.5* 21.0* 22.7*  ?MCV 95.0   < > 91.5  90.1 88.1  --  93.0  ?PLT 274   < > 214 199 207  --  205  ? < > = values in this interval not displayed.  ? ? ?  ?Medications:   ? ? amLODipine  5 mg Oral Daily  ? calcitRIOL  1 mcg Oral Daily  ? carvedilol  12.5 mg Oral BID WC  ? chlorhexidine  15 mL Mouth Rinse BID  ? Chlorhexidine Gluconate Cloth  6 each Topical Q0600  ? darbepoetin (ARANESP) injection - DIALYSIS  150 mcg Intravenous Q Mon-HD  ? furosemide  80 mg Oral BID  ? heparin  5,000 Units Subcutaneous Q8H  ? lanthanum  500 mg Oral TID WC  ? mouth rinse  15 mL Mouth Rinse q12n4p  ? ? ? ?Reesa Chew ? ? ?06/04/2021, 8:48 AM   ?

## 2021-06-05 ENCOUNTER — Telehealth: Payer: Self-pay

## 2021-06-05 DIAGNOSIS — D688 Other specified coagulation defects: Secondary | ICD-10-CM | POA: Diagnosis not present

## 2021-06-05 DIAGNOSIS — N2581 Secondary hyperparathyroidism of renal origin: Secondary | ICD-10-CM | POA: Diagnosis not present

## 2021-06-05 DIAGNOSIS — A158 Other respiratory tuberculosis: Secondary | ICD-10-CM | POA: Diagnosis not present

## 2021-06-05 DIAGNOSIS — Z992 Dependence on renal dialysis: Secondary | ICD-10-CM | POA: Diagnosis not present

## 2021-06-05 DIAGNOSIS — N186 End stage renal disease: Secondary | ICD-10-CM | POA: Diagnosis not present

## 2021-06-05 DIAGNOSIS — T8249XA Other complication of vascular dialysis catheter, initial encounter: Secondary | ICD-10-CM | POA: Diagnosis not present

## 2021-06-05 NOTE — Telephone Encounter (Signed)
Transition Care Management Follow-up Telephone Call ?Date of discharge and from where: 06/03/2021-[facility]  ?How have you been since you were released from the hospital? Pt stated he is doing ok.  ?Any questions or concerns? Yes patient has some leaking where an insertion was done. Patient will go to the ED to have removed per his request.  ? ?Items Reviewed: ?Did the pt receive and understand the discharge instructions provided? Yes  ?Medications obtained and verified? Yes  ?Other? No  ?Any new allergies since your discharge? No  ?Dietary orders reviewed? No ?Do you have support at home? Yes  ? ?Home Care and Equipment/Supplies: ?Were home health services ordered? not applicable ?If so, what is the name of the agency? N/A  ?Has the agency set up a time to come to the patient's home? not applicable ?Were any new equipment or medical supplies ordered?  No ?What is the name of the medical supply agency? N/A ?Were you able to get the supplies/equipment? not applicable ?Do you have any questions related to the use of the equipment or supplies? No ? ?Functional Questionnaire: (I = Independent and D = Dependent) ?ADLs: I ? ?Bathing/Dressing- I ? ?Meal Prep- I ? ?Eating- I ? ?Maintaining continence- I ? ?Transferring/Ambulation- I ? ?Managing Meds- I ? ?Follow up appointments reviewed: ? ?PCP Hospital f/u appt confirmed? No  confirmed? No   ?Are transportation arrangements needed? No  ?If their condition worsens, is the pt aware to call PCP or go to the Emergency Dept.? Yes ?Was the patient provided with contact information for the PCP's office or ED? Yes ?Was to pt encouraged to call back with questions or concerns? Yes  ?

## 2021-06-08 DIAGNOSIS — D688 Other specified coagulation defects: Secondary | ICD-10-CM | POA: Diagnosis not present

## 2021-06-08 DIAGNOSIS — Z992 Dependence on renal dialysis: Secondary | ICD-10-CM | POA: Diagnosis not present

## 2021-06-08 DIAGNOSIS — D631 Anemia in chronic kidney disease: Secondary | ICD-10-CM | POA: Diagnosis not present

## 2021-06-08 DIAGNOSIS — N185 Chronic kidney disease, stage 5: Secondary | ICD-10-CM | POA: Diagnosis not present

## 2021-06-08 DIAGNOSIS — N2581 Secondary hyperparathyroidism of renal origin: Secondary | ICD-10-CM | POA: Diagnosis not present

## 2021-06-08 DIAGNOSIS — N186 End stage renal disease: Secondary | ICD-10-CM | POA: Diagnosis not present

## 2021-06-08 DIAGNOSIS — T8249XA Other complication of vascular dialysis catheter, initial encounter: Secondary | ICD-10-CM | POA: Diagnosis not present

## 2021-06-10 DIAGNOSIS — D688 Other specified coagulation defects: Secondary | ICD-10-CM | POA: Diagnosis not present

## 2021-06-10 DIAGNOSIS — N2581 Secondary hyperparathyroidism of renal origin: Secondary | ICD-10-CM | POA: Diagnosis not present

## 2021-06-10 DIAGNOSIS — T8249XA Other complication of vascular dialysis catheter, initial encounter: Secondary | ICD-10-CM | POA: Diagnosis not present

## 2021-06-10 DIAGNOSIS — D631 Anemia in chronic kidney disease: Secondary | ICD-10-CM | POA: Diagnosis not present

## 2021-06-10 DIAGNOSIS — N186 End stage renal disease: Secondary | ICD-10-CM | POA: Diagnosis not present

## 2021-06-10 DIAGNOSIS — N185 Chronic kidney disease, stage 5: Secondary | ICD-10-CM | POA: Diagnosis not present

## 2021-06-10 DIAGNOSIS — Z992 Dependence on renal dialysis: Secondary | ICD-10-CM | POA: Diagnosis not present

## 2021-06-11 ENCOUNTER — Ambulatory Visit: Payer: Medicaid Other | Admitting: Family Medicine

## 2021-06-11 VITALS — BP 140/98 | HR 73 | Ht 69.0 in | Wt 309.1 lb

## 2021-06-11 DIAGNOSIS — I10 Essential (primary) hypertension: Secondary | ICD-10-CM

## 2021-06-11 NOTE — Progress Notes (Signed)
? ?  SUBJECTIVE:  ? ?CHIEF COMPLAINT / HPI:  ? ?Chief Complaint  ?Patient presents with  ? Follow-up  ?  Hospital  ? ? ? ?Johnny [name] is a 61 y.o. male here for hospital follow-up.  Patient admitted to the ICU for flash pulmonary edema and found to be in acute kidney injury requiring dialysis.  Last went to dialysis yesterday.  He is no longer short of breath.  His lower extremity edema is improved.  He tries to keep his legs elevated when he is at rest.  Denies chest pain. ? ? ? ?PERTINENT  PMH / PSH: reviewed and updated as appropriate  ? ?OBJECTIVE:  ? ?BP (!) 140/98   Pulse 73   Ht '5\' 9"'$  (1.753 m)   Wt (!) 309 lb 2 oz (140.2 kg)   SpO2 100%   BMI 45.65 kg/m?   ? ?GEN: pleasant well appearing male, in no acute distress  ?CV: regular rate and rhythm ?RESP: no increased work of breathing, clear to ascultation bilaterally ?MSK: bilateral LE edema 2-3+ (improved per pt), wearing compression hose ?SKIN: warm, dry ? ? ? ?ASSESSMENT/PLAN:  ? ? ?Acute respiratory distress ?Resolved.  Lungs clear.  Lower extremity edema improving.  Advised to elevate feet at rest. ? ?ESRD ?Patient started dialysis during his admission.  He has a tunneled dialysis catheter as well as a left upper extremity fistula.  He is following with [facility]. On a Monday Wednesday Friday schedule.  Obtain BMP today. ? ?Anemia ?Hemoglobin 7-8 in the hospital.  Started Aranesp.  Obtain CBC today. ? ?OSA ?He is not using his CPAP.  Recommended to use CPAP however patient declines.  Reports he has lost a lot of weight and no longer snores.   ? ?Indwelling catheter ?Has follow-up scheduled with urology next week.  Reminded to keep this follow-up. ? ?Lyndee Hensen, DO ?PGY-3, [facility] ?06/11/2021  ? ? ? ? ? ? ? ? ?

## 2021-06-11 NOTE — Patient Instructions (Addendum)
Keep your follow up appointments as scheduled.  ? ?Let me know if you need any refills of your medications.  ? ? ?

## 2021-06-12 ENCOUNTER — Encounter: Payer: Self-pay | Admitting: Family Medicine

## 2021-06-12 DIAGNOSIS — N186 End stage renal disease: Secondary | ICD-10-CM | POA: Diagnosis not present

## 2021-06-12 DIAGNOSIS — D688 Other specified coagulation defects: Secondary | ICD-10-CM | POA: Diagnosis not present

## 2021-06-12 DIAGNOSIS — Z992 Dependence on renal dialysis: Secondary | ICD-10-CM | POA: Diagnosis not present

## 2021-06-12 DIAGNOSIS — N185 Chronic kidney disease, stage 5: Secondary | ICD-10-CM | POA: Diagnosis not present

## 2021-06-12 DIAGNOSIS — N2581 Secondary hyperparathyroidism of renal origin: Secondary | ICD-10-CM | POA: Diagnosis not present

## 2021-06-12 DIAGNOSIS — D631 Anemia in chronic kidney disease: Secondary | ICD-10-CM | POA: Diagnosis not present

## 2021-06-12 DIAGNOSIS — T8249XA Other complication of vascular dialysis catheter, initial encounter: Secondary | ICD-10-CM | POA: Diagnosis not present

## 2021-06-12 LAB — CBC
Hematocrit: 23.5 % — ABNORMAL LOW (ref 37.5–51.0)
Hemoglobin: 7.8 g/dL — ABNORMAL LOW (ref 13.0–17.7)
MCH: 29 pg (ref 26.6–33.0)
MCHC: 33.2 g/dL (ref 31.5–35.7)
MCV: 87 fL (ref 79–97)
Platelets: 281 10*3/uL (ref 150–450)
RBC: 2.69 x10E6/uL — CL (ref 4.14–5.80)
RDW: 14.7 % (ref 11.6–15.4)
WBC: 4.4 10*3/uL (ref 3.4–10.8)

## 2021-06-12 LAB — BASIC METABOLIC PANEL
BUN/Creatinine Ratio: 4 — ABNORMAL LOW (ref 9–20)
BUN: 23 mg/dL (ref 6–24)
CO2: 26 mmol/L (ref 20–29)
Calcium: 8.7 mg/dL (ref 8.7–10.2)
Chloride: 94 mmol/L — ABNORMAL LOW (ref 96–106)
Creatinine, Ser: 5.42 mg/dL — ABNORMAL HIGH (ref 0.76–1.27)
Glucose: 92 mg/dL (ref 70–99)
Potassium: 3.7 mmol/L (ref 3.5–5.2)
Sodium: 136 mmol/L (ref 134–144)
eGFR: 11 mL/min/{1.73_m2} — ABNORMAL LOW (ref >59)

## 2021-06-15 DIAGNOSIS — N186 End stage renal disease: Secondary | ICD-10-CM | POA: Diagnosis not present

## 2021-06-15 DIAGNOSIS — D688 Other specified coagulation defects: Secondary | ICD-10-CM | POA: Diagnosis not present

## 2021-06-15 DIAGNOSIS — T8249XA Other complication of vascular dialysis catheter, initial encounter: Secondary | ICD-10-CM | POA: Diagnosis not present

## 2021-06-15 DIAGNOSIS — Z992 Dependence on renal dialysis: Secondary | ICD-10-CM | POA: Diagnosis not present

## 2021-06-15 DIAGNOSIS — N185 Chronic kidney disease, stage 5: Secondary | ICD-10-CM | POA: Diagnosis not present

## 2021-06-15 DIAGNOSIS — D631 Anemia in chronic kidney disease: Secondary | ICD-10-CM | POA: Diagnosis not present

## 2021-06-15 DIAGNOSIS — N2581 Secondary hyperparathyroidism of renal origin: Secondary | ICD-10-CM | POA: Diagnosis not present

## 2021-06-16 ENCOUNTER — Other Ambulatory Visit: Payer: Self-pay | Admitting: *Deleted

## 2021-06-16 NOTE — Patient Outreach (Signed)
Care Coordination ? ?06/16/2021 ? ?[name] ?06/23/1960 ?[id number] ? ? ?Medicaid Managed Care  ? ?Unsuccessful Outreach Note ? ?06/16/2021 ?Name: Johnny [name] MRN: [id number] DOB: 10-01-1960 ? ?Referred by: Rise Patience, DO ?Reason for referral : High Risk Managed Medicaid (Unsuccessful RNCM initial telephone outreach) ? ? ?An unsuccessful telephone outreach was attempted today. The patient was referred to the case management team for assistance with care management and care coordination.  ? ?Follow Up Plan: A HIPAA compliant phone message was left for the patient providing contact information and requesting a return call.  ? ?Lurena Joiner RN, BSN ?[place] ?RN Care Coordinator ? ? ?

## 2021-06-16 NOTE — Patient Instructions (Signed)
Visit Information ? ?[name]  - as a part of your Medicaid benefit, you are eligible for care management and care coordination services at no cost or copay. I was unable to reach you by phone today but would be happy to help you with your health related needs. Please feel free to call me @ [phone].  ? ?A member of the Managed Medicaid care management team will reach out to you again over the next 14 days.  ? ?Lurena Joiner RN, BSN ?[place] ?RN Care Coordinator ?  ?

## 2021-06-17 DIAGNOSIS — N185 Chronic kidney disease, stage 5: Secondary | ICD-10-CM | POA: Diagnosis not present

## 2021-06-17 DIAGNOSIS — N2581 Secondary hyperparathyroidism of renal origin: Secondary | ICD-10-CM | POA: Diagnosis not present

## 2021-06-17 DIAGNOSIS — D631 Anemia in chronic kidney disease: Secondary | ICD-10-CM | POA: Diagnosis not present

## 2021-06-17 DIAGNOSIS — T8249XA Other complication of vascular dialysis catheter, initial encounter: Secondary | ICD-10-CM | POA: Diagnosis not present

## 2021-06-17 DIAGNOSIS — N186 End stage renal disease: Secondary | ICD-10-CM | POA: Diagnosis not present

## 2021-06-17 DIAGNOSIS — D688 Other specified coagulation defects: Secondary | ICD-10-CM | POA: Diagnosis not present

## 2021-06-17 DIAGNOSIS — Z992 Dependence on renal dialysis: Secondary | ICD-10-CM | POA: Diagnosis not present

## 2021-06-18 ENCOUNTER — Telehealth: Payer: Self-pay | Admitting: Family Medicine

## 2021-06-18 NOTE — Telephone Encounter (Signed)
.. ?  Medicaid Managed Care  ? ?Unsuccessful Outreach Note ? ?06/18/2021 ?Name: [name] MRN: [id number] DOB: [birth year] ? ?Referred by: Rise Patience, DO ?Reason for referral : High Risk Managed Medicaid (I called the patient today to get him rescheduled with the MM RNCM. I left my name and number on his VM.) ? ? ?An unsuccessful telephone outreach was attempted today. The patient was referred to the case management team for assistance with care management and care coordination.  ? ?Follow Up Plan: The care management team will reach out to the patient again over the next 7 days.  ? ? ?Reita Chard ?Care Guide, High Risk Medicaid Managed Care ?Embedded Care Coordination ?[place]  ? ? ?SIGNATURE  ?

## 2021-06-19 DIAGNOSIS — D631 Anemia in chronic kidney disease: Secondary | ICD-10-CM | POA: Diagnosis not present

## 2021-06-19 DIAGNOSIS — N186 End stage renal disease: Secondary | ICD-10-CM | POA: Diagnosis not present

## 2021-06-19 DIAGNOSIS — N2581 Secondary hyperparathyroidism of renal origin: Secondary | ICD-10-CM | POA: Diagnosis not present

## 2021-06-19 DIAGNOSIS — D688 Other specified coagulation defects: Secondary | ICD-10-CM | POA: Diagnosis not present

## 2021-06-19 DIAGNOSIS — N185 Chronic kidney disease, stage 5: Secondary | ICD-10-CM | POA: Diagnosis not present

## 2021-06-19 DIAGNOSIS — T8249XA Other complication of vascular dialysis catheter, initial encounter: Secondary | ICD-10-CM | POA: Diagnosis not present

## 2021-06-19 DIAGNOSIS — Z992 Dependence on renal dialysis: Secondary | ICD-10-CM | POA: Diagnosis not present

## 2021-06-21 DIAGNOSIS — Z992 Dependence on renal dialysis: Secondary | ICD-10-CM | POA: Diagnosis not present

## 2021-06-21 DIAGNOSIS — I120 Hypertensive chronic kidney disease with stage 5 chronic kidney disease or end stage renal disease: Secondary | ICD-10-CM | POA: Diagnosis not present

## 2021-06-21 DIAGNOSIS — N186 End stage renal disease: Secondary | ICD-10-CM | POA: Diagnosis not present

## 2021-06-22 DIAGNOSIS — D688 Other specified coagulation defects: Secondary | ICD-10-CM | POA: Diagnosis not present

## 2021-06-22 DIAGNOSIS — N2581 Secondary hyperparathyroidism of renal origin: Secondary | ICD-10-CM | POA: Diagnosis not present

## 2021-06-22 DIAGNOSIS — Z992 Dependence on renal dialysis: Secondary | ICD-10-CM | POA: Diagnosis not present

## 2021-06-22 DIAGNOSIS — N185 Chronic kidney disease, stage 5: Secondary | ICD-10-CM | POA: Diagnosis not present

## 2021-06-22 DIAGNOSIS — T8249XA Other complication of vascular dialysis catheter, initial encounter: Secondary | ICD-10-CM | POA: Diagnosis not present

## 2021-06-22 DIAGNOSIS — N186 End stage renal disease: Secondary | ICD-10-CM | POA: Diagnosis not present

## 2021-06-22 DIAGNOSIS — D631 Anemia in chronic kidney disease: Secondary | ICD-10-CM | POA: Diagnosis not present

## 2021-06-24 DIAGNOSIS — D688 Other specified coagulation defects: Secondary | ICD-10-CM | POA: Diagnosis not present

## 2021-06-24 DIAGNOSIS — T8249XA Other complication of vascular dialysis catheter, initial encounter: Secondary | ICD-10-CM | POA: Diagnosis not present

## 2021-06-24 DIAGNOSIS — N186 End stage renal disease: Secondary | ICD-10-CM | POA: Diagnosis not present

## 2021-06-24 DIAGNOSIS — D631 Anemia in chronic kidney disease: Secondary | ICD-10-CM | POA: Diagnosis not present

## 2021-06-24 DIAGNOSIS — Z992 Dependence on renal dialysis: Secondary | ICD-10-CM | POA: Diagnosis not present

## 2021-06-24 DIAGNOSIS — N185 Chronic kidney disease, stage 5: Secondary | ICD-10-CM | POA: Diagnosis not present

## 2021-06-24 DIAGNOSIS — N2581 Secondary hyperparathyroidism of renal origin: Secondary | ICD-10-CM | POA: Diagnosis not present

## 2021-06-25 DIAGNOSIS — R338 Other retention of urine: Secondary | ICD-10-CM | POA: Diagnosis not present

## 2021-06-26 DIAGNOSIS — Z992 Dependence on renal dialysis: Secondary | ICD-10-CM | POA: Diagnosis not present

## 2021-06-26 DIAGNOSIS — N185 Chronic kidney disease, stage 5: Secondary | ICD-10-CM | POA: Diagnosis not present

## 2021-06-26 DIAGNOSIS — D688 Other specified coagulation defects: Secondary | ICD-10-CM | POA: Diagnosis not present

## 2021-06-26 DIAGNOSIS — D631 Anemia in chronic kidney disease: Secondary | ICD-10-CM | POA: Diagnosis not present

## 2021-06-26 DIAGNOSIS — T8249XA Other complication of vascular dialysis catheter, initial encounter: Secondary | ICD-10-CM | POA: Diagnosis not present

## 2021-06-26 DIAGNOSIS — N186 End stage renal disease: Secondary | ICD-10-CM | POA: Diagnosis not present

## 2021-06-26 DIAGNOSIS — N2581 Secondary hyperparathyroidism of renal origin: Secondary | ICD-10-CM | POA: Diagnosis not present

## 2021-06-29 ENCOUNTER — Other Ambulatory Visit: Payer: Self-pay

## 2021-06-29 DIAGNOSIS — T8249XA Other complication of vascular dialysis catheter, initial encounter: Secondary | ICD-10-CM | POA: Diagnosis not present

## 2021-06-29 DIAGNOSIS — Z992 Dependence on renal dialysis: Secondary | ICD-10-CM | POA: Diagnosis not present

## 2021-06-29 DIAGNOSIS — N186 End stage renal disease: Secondary | ICD-10-CM

## 2021-06-29 DIAGNOSIS — N185 Chronic kidney disease, stage 5: Secondary | ICD-10-CM | POA: Diagnosis not present

## 2021-06-29 DIAGNOSIS — D688 Other specified coagulation defects: Secondary | ICD-10-CM | POA: Diagnosis not present

## 2021-06-29 DIAGNOSIS — D631 Anemia in chronic kidney disease: Secondary | ICD-10-CM | POA: Diagnosis not present

## 2021-06-29 DIAGNOSIS — N2581 Secondary hyperparathyroidism of renal origin: Secondary | ICD-10-CM | POA: Diagnosis not present

## 2021-07-01 DIAGNOSIS — T8249XA Other complication of vascular dialysis catheter, initial encounter: Secondary | ICD-10-CM | POA: Diagnosis not present

## 2021-07-01 DIAGNOSIS — D688 Other specified coagulation defects: Secondary | ICD-10-CM | POA: Diagnosis not present

## 2021-07-01 DIAGNOSIS — N2581 Secondary hyperparathyroidism of renal origin: Secondary | ICD-10-CM | POA: Diagnosis not present

## 2021-07-01 DIAGNOSIS — N185 Chronic kidney disease, stage 5: Secondary | ICD-10-CM | POA: Diagnosis not present

## 2021-07-01 DIAGNOSIS — D631 Anemia in chronic kidney disease: Secondary | ICD-10-CM | POA: Diagnosis not present

## 2021-07-01 DIAGNOSIS — N186 End stage renal disease: Secondary | ICD-10-CM | POA: Diagnosis not present

## 2021-07-01 DIAGNOSIS — Z992 Dependence on renal dialysis: Secondary | ICD-10-CM | POA: Diagnosis not present

## 2021-07-02 NOTE — Progress Notes (Deleted)
  Patient did not show up for appointment or appointment was cancelled

## 2021-07-03 DIAGNOSIS — T8249XA Other complication of vascular dialysis catheter, initial encounter: Secondary | ICD-10-CM | POA: Diagnosis not present

## 2021-07-03 DIAGNOSIS — N2581 Secondary hyperparathyroidism of renal origin: Secondary | ICD-10-CM | POA: Diagnosis not present

## 2021-07-03 DIAGNOSIS — D688 Other specified coagulation defects: Secondary | ICD-10-CM | POA: Diagnosis not present

## 2021-07-03 DIAGNOSIS — N186 End stage renal disease: Secondary | ICD-10-CM | POA: Diagnosis not present

## 2021-07-03 DIAGNOSIS — N185 Chronic kidney disease, stage 5: Secondary | ICD-10-CM | POA: Diagnosis not present

## 2021-07-03 DIAGNOSIS — Z992 Dependence on renal dialysis: Secondary | ICD-10-CM | POA: Diagnosis not present

## 2021-07-03 DIAGNOSIS — D631 Anemia in chronic kidney disease: Secondary | ICD-10-CM | POA: Diagnosis not present

## 2021-07-06 DIAGNOSIS — N185 Chronic kidney disease, stage 5: Secondary | ICD-10-CM | POA: Diagnosis not present

## 2021-07-06 DIAGNOSIS — T8249XA Other complication of vascular dialysis catheter, initial encounter: Secondary | ICD-10-CM | POA: Diagnosis not present

## 2021-07-06 DIAGNOSIS — Z992 Dependence on renal dialysis: Secondary | ICD-10-CM | POA: Diagnosis not present

## 2021-07-06 DIAGNOSIS — D688 Other specified coagulation defects: Secondary | ICD-10-CM | POA: Diagnosis not present

## 2021-07-06 DIAGNOSIS — N2581 Secondary hyperparathyroidism of renal origin: Secondary | ICD-10-CM | POA: Diagnosis not present

## 2021-07-06 DIAGNOSIS — N186 End stage renal disease: Secondary | ICD-10-CM | POA: Diagnosis not present

## 2021-07-08 DIAGNOSIS — N185 Chronic kidney disease, stage 5: Secondary | ICD-10-CM | POA: Diagnosis not present

## 2021-07-08 DIAGNOSIS — N186 End stage renal disease: Secondary | ICD-10-CM | POA: Diagnosis not present

## 2021-07-08 DIAGNOSIS — N2581 Secondary hyperparathyroidism of renal origin: Secondary | ICD-10-CM | POA: Diagnosis not present

## 2021-07-08 DIAGNOSIS — D688 Other specified coagulation defects: Secondary | ICD-10-CM | POA: Diagnosis not present

## 2021-07-08 DIAGNOSIS — T8249XA Other complication of vascular dialysis catheter, initial encounter: Secondary | ICD-10-CM | POA: Diagnosis not present

## 2021-07-08 DIAGNOSIS — Z992 Dependence on renal dialysis: Secondary | ICD-10-CM | POA: Diagnosis not present

## 2021-07-10 DIAGNOSIS — T8249XA Other complication of vascular dialysis catheter, initial encounter: Secondary | ICD-10-CM | POA: Diagnosis not present

## 2021-07-10 DIAGNOSIS — N186 End stage renal disease: Secondary | ICD-10-CM | POA: Diagnosis not present

## 2021-07-10 DIAGNOSIS — N185 Chronic kidney disease, stage 5: Secondary | ICD-10-CM | POA: Diagnosis not present

## 2021-07-10 DIAGNOSIS — D688 Other specified coagulation defects: Secondary | ICD-10-CM | POA: Diagnosis not present

## 2021-07-10 DIAGNOSIS — N2581 Secondary hyperparathyroidism of renal origin: Secondary | ICD-10-CM | POA: Diagnosis not present

## 2021-07-10 DIAGNOSIS — Z992 Dependence on renal dialysis: Secondary | ICD-10-CM | POA: Diagnosis not present

## 2021-07-13 DIAGNOSIS — T8249XA Other complication of vascular dialysis catheter, initial encounter: Secondary | ICD-10-CM | POA: Diagnosis not present

## 2021-07-13 DIAGNOSIS — N185 Chronic kidney disease, stage 5: Secondary | ICD-10-CM | POA: Diagnosis not present

## 2021-07-13 DIAGNOSIS — D688 Other specified coagulation defects: Secondary | ICD-10-CM | POA: Diagnosis not present

## 2021-07-13 DIAGNOSIS — Z992 Dependence on renal dialysis: Secondary | ICD-10-CM | POA: Diagnosis not present

## 2021-07-13 DIAGNOSIS — D631 Anemia in chronic kidney disease: Secondary | ICD-10-CM | POA: Diagnosis not present

## 2021-07-13 DIAGNOSIS — N2581 Secondary hyperparathyroidism of renal origin: Secondary | ICD-10-CM | POA: Diagnosis not present

## 2021-07-13 DIAGNOSIS — N186 End stage renal disease: Secondary | ICD-10-CM | POA: Diagnosis not present

## 2021-07-14 ENCOUNTER — Ambulatory Visit: Payer: Medicaid Other

## 2021-07-14 ENCOUNTER — Encounter (HOSPITAL_COMMUNITY): Payer: Medicaid Other

## 2021-07-14 DIAGNOSIS — N186 End stage renal disease: Secondary | ICD-10-CM

## 2021-07-14 DIAGNOSIS — R338 Other retention of urine: Secondary | ICD-10-CM | POA: Diagnosis not present

## 2021-07-15 DIAGNOSIS — N2581 Secondary hyperparathyroidism of renal origin: Secondary | ICD-10-CM | POA: Diagnosis not present

## 2021-07-15 DIAGNOSIS — D688 Other specified coagulation defects: Secondary | ICD-10-CM | POA: Diagnosis not present

## 2021-07-15 DIAGNOSIS — N185 Chronic kidney disease, stage 5: Secondary | ICD-10-CM | POA: Diagnosis not present

## 2021-07-15 DIAGNOSIS — T8249XA Other complication of vascular dialysis catheter, initial encounter: Secondary | ICD-10-CM | POA: Diagnosis not present

## 2021-07-15 DIAGNOSIS — Z992 Dependence on renal dialysis: Secondary | ICD-10-CM | POA: Diagnosis not present

## 2021-07-15 DIAGNOSIS — N186 End stage renal disease: Secondary | ICD-10-CM | POA: Diagnosis not present

## 2021-07-15 DIAGNOSIS — D631 Anemia in chronic kidney disease: Secondary | ICD-10-CM | POA: Diagnosis not present

## 2021-07-17 DIAGNOSIS — T8249XA Other complication of vascular dialysis catheter, initial encounter: Secondary | ICD-10-CM | POA: Diagnosis not present

## 2021-07-17 DIAGNOSIS — N186 End stage renal disease: Secondary | ICD-10-CM | POA: Diagnosis not present

## 2021-07-17 DIAGNOSIS — Z992 Dependence on renal dialysis: Secondary | ICD-10-CM | POA: Diagnosis not present

## 2021-07-17 DIAGNOSIS — D688 Other specified coagulation defects: Secondary | ICD-10-CM | POA: Diagnosis not present

## 2021-07-17 DIAGNOSIS — N185 Chronic kidney disease, stage 5: Secondary | ICD-10-CM | POA: Diagnosis not present

## 2021-07-17 DIAGNOSIS — N2581 Secondary hyperparathyroidism of renal origin: Secondary | ICD-10-CM | POA: Diagnosis not present

## 2021-07-17 DIAGNOSIS — D631 Anemia in chronic kidney disease: Secondary | ICD-10-CM | POA: Diagnosis not present

## 2021-07-20 DIAGNOSIS — D688 Other specified coagulation defects: Secondary | ICD-10-CM | POA: Diagnosis not present

## 2021-07-20 DIAGNOSIS — T8249XA Other complication of vascular dialysis catheter, initial encounter: Secondary | ICD-10-CM | POA: Diagnosis not present

## 2021-07-20 DIAGNOSIS — Z992 Dependence on renal dialysis: Secondary | ICD-10-CM | POA: Diagnosis not present

## 2021-07-20 DIAGNOSIS — N2581 Secondary hyperparathyroidism of renal origin: Secondary | ICD-10-CM | POA: Diagnosis not present

## 2021-07-20 DIAGNOSIS — D631 Anemia in chronic kidney disease: Secondary | ICD-10-CM | POA: Diagnosis not present

## 2021-07-20 DIAGNOSIS — N185 Chronic kidney disease, stage 5: Secondary | ICD-10-CM | POA: Diagnosis not present

## 2021-07-20 DIAGNOSIS — N186 End stage renal disease: Secondary | ICD-10-CM | POA: Diagnosis not present

## 2021-07-22 DIAGNOSIS — N2581 Secondary hyperparathyroidism of renal origin: Secondary | ICD-10-CM | POA: Diagnosis not present

## 2021-07-22 DIAGNOSIS — N185 Chronic kidney disease, stage 5: Secondary | ICD-10-CM | POA: Diagnosis not present

## 2021-07-22 DIAGNOSIS — I120 Hypertensive chronic kidney disease with stage 5 chronic kidney disease or end stage renal disease: Secondary | ICD-10-CM | POA: Diagnosis not present

## 2021-07-22 DIAGNOSIS — Z992 Dependence on renal dialysis: Secondary | ICD-10-CM | POA: Diagnosis not present

## 2021-07-22 DIAGNOSIS — T8249XA Other complication of vascular dialysis catheter, initial encounter: Secondary | ICD-10-CM | POA: Diagnosis not present

## 2021-07-22 DIAGNOSIS — D688 Other specified coagulation defects: Secondary | ICD-10-CM | POA: Diagnosis not present

## 2021-07-22 DIAGNOSIS — N186 End stage renal disease: Secondary | ICD-10-CM | POA: Diagnosis not present

## 2021-07-22 DIAGNOSIS — D631 Anemia in chronic kidney disease: Secondary | ICD-10-CM | POA: Diagnosis not present

## 2021-07-24 DIAGNOSIS — N2581 Secondary hyperparathyroidism of renal origin: Secondary | ICD-10-CM | POA: Diagnosis not present

## 2021-07-24 DIAGNOSIS — T8249XA Other complication of vascular dialysis catheter, initial encounter: Secondary | ICD-10-CM | POA: Diagnosis not present

## 2021-07-24 DIAGNOSIS — D688 Other specified coagulation defects: Secondary | ICD-10-CM | POA: Diagnosis not present

## 2021-07-24 DIAGNOSIS — N186 End stage renal disease: Secondary | ICD-10-CM | POA: Diagnosis not present

## 2021-07-24 DIAGNOSIS — D631 Anemia in chronic kidney disease: Secondary | ICD-10-CM | POA: Diagnosis not present

## 2021-07-24 DIAGNOSIS — N185 Chronic kidney disease, stage 5: Secondary | ICD-10-CM | POA: Diagnosis not present

## 2021-07-24 DIAGNOSIS — Z992 Dependence on renal dialysis: Secondary | ICD-10-CM | POA: Diagnosis not present

## 2021-07-27 DIAGNOSIS — D688 Other specified coagulation defects: Secondary | ICD-10-CM | POA: Diagnosis not present

## 2021-07-27 DIAGNOSIS — N185 Chronic kidney disease, stage 5: Secondary | ICD-10-CM | POA: Diagnosis not present

## 2021-07-27 DIAGNOSIS — Z992 Dependence on renal dialysis: Secondary | ICD-10-CM | POA: Diagnosis not present

## 2021-07-27 DIAGNOSIS — D631 Anemia in chronic kidney disease: Secondary | ICD-10-CM | POA: Diagnosis not present

## 2021-07-27 DIAGNOSIS — N186 End stage renal disease: Secondary | ICD-10-CM | POA: Diagnosis not present

## 2021-07-27 DIAGNOSIS — N2581 Secondary hyperparathyroidism of renal origin: Secondary | ICD-10-CM | POA: Diagnosis not present

## 2021-07-27 DIAGNOSIS — T8249XA Other complication of vascular dialysis catheter, initial encounter: Secondary | ICD-10-CM | POA: Diagnosis not present

## 2021-07-28 ENCOUNTER — Encounter: Payer: Self-pay | Admitting: *Deleted

## 2021-07-29 DIAGNOSIS — N2581 Secondary hyperparathyroidism of renal origin: Secondary | ICD-10-CM | POA: Diagnosis not present

## 2021-07-29 DIAGNOSIS — D631 Anemia in chronic kidney disease: Secondary | ICD-10-CM | POA: Diagnosis not present

## 2021-07-29 DIAGNOSIS — T8249XA Other complication of vascular dialysis catheter, initial encounter: Secondary | ICD-10-CM | POA: Diagnosis not present

## 2021-07-29 DIAGNOSIS — N185 Chronic kidney disease, stage 5: Secondary | ICD-10-CM | POA: Diagnosis not present

## 2021-07-29 DIAGNOSIS — Z992 Dependence on renal dialysis: Secondary | ICD-10-CM | POA: Diagnosis not present

## 2021-07-29 DIAGNOSIS — D688 Other specified coagulation defects: Secondary | ICD-10-CM | POA: Diagnosis not present

## 2021-07-29 DIAGNOSIS — N186 End stage renal disease: Secondary | ICD-10-CM | POA: Diagnosis not present

## 2021-07-30 ENCOUNTER — Ambulatory Visit: Payer: Medicaid Other

## 2021-07-31 DIAGNOSIS — D688 Other specified coagulation defects: Secondary | ICD-10-CM | POA: Diagnosis not present

## 2021-07-31 DIAGNOSIS — D631 Anemia in chronic kidney disease: Secondary | ICD-10-CM | POA: Diagnosis not present

## 2021-07-31 DIAGNOSIS — Z992 Dependence on renal dialysis: Secondary | ICD-10-CM | POA: Diagnosis not present

## 2021-07-31 DIAGNOSIS — N185 Chronic kidney disease, stage 5: Secondary | ICD-10-CM | POA: Diagnosis not present

## 2021-07-31 DIAGNOSIS — N186 End stage renal disease: Secondary | ICD-10-CM | POA: Diagnosis not present

## 2021-07-31 DIAGNOSIS — N2581 Secondary hyperparathyroidism of renal origin: Secondary | ICD-10-CM | POA: Diagnosis not present

## 2021-07-31 DIAGNOSIS — T8249XA Other complication of vascular dialysis catheter, initial encounter: Secondary | ICD-10-CM | POA: Diagnosis not present

## 2021-08-03 DIAGNOSIS — N185 Chronic kidney disease, stage 5: Secondary | ICD-10-CM | POA: Diagnosis not present

## 2021-08-03 DIAGNOSIS — N186 End stage renal disease: Secondary | ICD-10-CM | POA: Diagnosis not present

## 2021-08-03 DIAGNOSIS — D631 Anemia in chronic kidney disease: Secondary | ICD-10-CM | POA: Diagnosis not present

## 2021-08-03 DIAGNOSIS — Z992 Dependence on renal dialysis: Secondary | ICD-10-CM | POA: Diagnosis not present

## 2021-08-03 DIAGNOSIS — N2581 Secondary hyperparathyroidism of renal origin: Secondary | ICD-10-CM | POA: Diagnosis not present

## 2021-08-03 DIAGNOSIS — T8249XA Other complication of vascular dialysis catheter, initial encounter: Secondary | ICD-10-CM | POA: Diagnosis not present

## 2021-08-03 DIAGNOSIS — D688 Other specified coagulation defects: Secondary | ICD-10-CM | POA: Diagnosis not present

## 2021-08-04 ENCOUNTER — Other Ambulatory Visit: Payer: Self-pay | Admitting: *Deleted

## 2021-08-04 NOTE — Patient Outreach (Signed)
Care Coordination  08/04/2021  Johnny [name] September 14, 1960 [id number]   Medicaid Managed Care   Unsuccessful Outreach Note  08/04/2021 Name: Johnny [name] MRN: [id number] DOB: August 13, 1960  Referred by: Rise Patience, DO Reason for referral : High Risk Managed Medicaid (Unsuccessful RNCM initial outreach)   Third unsuccessful telephone outreach was attempted today. The patient was referred to the case management team for assistance with care management and care coordination. The patient's primary care provider has been notified of our unsuccessful attempts to make or maintain contact with the patient. The care management team is pleased to engage with this patient at any time in the future should he/she be interested in assistance from the care management team.   Follow Up Plan: We have been unable to make contact with the patient for follow up. The care management team is available to follow up with the patient after provider conversation with the patient regarding recommendation for care management engagement and subsequent re-referral to the care management team.   Lurena Joiner RN, BSN [place] RN Care Coordinator

## 2021-08-05 DIAGNOSIS — D688 Other specified coagulation defects: Secondary | ICD-10-CM | POA: Diagnosis not present

## 2021-08-05 DIAGNOSIS — D631 Anemia in chronic kidney disease: Secondary | ICD-10-CM | POA: Diagnosis not present

## 2021-08-05 DIAGNOSIS — Z992 Dependence on renal dialysis: Secondary | ICD-10-CM | POA: Diagnosis not present

## 2021-08-05 DIAGNOSIS — T8249XA Other complication of vascular dialysis catheter, initial encounter: Secondary | ICD-10-CM | POA: Diagnosis not present

## 2021-08-05 DIAGNOSIS — N2581 Secondary hyperparathyroidism of renal origin: Secondary | ICD-10-CM | POA: Diagnosis not present

## 2021-08-05 DIAGNOSIS — N185 Chronic kidney disease, stage 5: Secondary | ICD-10-CM | POA: Diagnosis not present

## 2021-08-05 DIAGNOSIS — N186 End stage renal disease: Secondary | ICD-10-CM | POA: Diagnosis not present

## 2021-08-07 DIAGNOSIS — N186 End stage renal disease: Secondary | ICD-10-CM | POA: Diagnosis not present

## 2021-08-07 DIAGNOSIS — N185 Chronic kidney disease, stage 5: Secondary | ICD-10-CM | POA: Diagnosis not present

## 2021-08-07 DIAGNOSIS — D631 Anemia in chronic kidney disease: Secondary | ICD-10-CM | POA: Diagnosis not present

## 2021-08-07 DIAGNOSIS — T8249XA Other complication of vascular dialysis catheter, initial encounter: Secondary | ICD-10-CM | POA: Diagnosis not present

## 2021-08-07 DIAGNOSIS — Z992 Dependence on renal dialysis: Secondary | ICD-10-CM | POA: Diagnosis not present

## 2021-08-07 DIAGNOSIS — N2581 Secondary hyperparathyroidism of renal origin: Secondary | ICD-10-CM | POA: Diagnosis not present

## 2021-08-07 DIAGNOSIS — D688 Other specified coagulation defects: Secondary | ICD-10-CM | POA: Diagnosis not present

## 2021-08-10 ENCOUNTER — Ambulatory Visit (HOSPITAL_COMMUNITY)
Admission: EM | Admit: 2021-08-10 | Discharge: 2021-08-10 | Disposition: A | Discharge status: Home / Self Care | Payer: Medicaid Other | Attending: Emergency Medicine | Admitting: Emergency Medicine

## 2021-08-10 ENCOUNTER — Encounter (HOSPITAL_COMMUNITY): Payer: Self-pay

## 2021-08-10 DIAGNOSIS — N2581 Secondary hyperparathyroidism of renal origin: Secondary | ICD-10-CM | POA: Diagnosis not present

## 2021-08-10 DIAGNOSIS — Z20822 Contact with and (suspected) exposure to covid-19: Secondary | ICD-10-CM | POA: Diagnosis present

## 2021-08-10 DIAGNOSIS — T8249XA Other complication of vascular dialysis catheter, initial encounter: Secondary | ICD-10-CM | POA: Diagnosis not present

## 2021-08-10 DIAGNOSIS — N186 End stage renal disease: Secondary | ICD-10-CM | POA: Diagnosis not present

## 2021-08-10 DIAGNOSIS — Z992 Dependence on renal dialysis: Secondary | ICD-10-CM | POA: Diagnosis not present

## 2021-08-10 DIAGNOSIS — D688 Other specified coagulation defects: Secondary | ICD-10-CM | POA: Diagnosis not present

## 2021-08-10 DIAGNOSIS — D631 Anemia in chronic kidney disease: Secondary | ICD-10-CM | POA: Diagnosis not present

## 2021-08-10 DIAGNOSIS — N185 Chronic kidney disease, stage 5: Secondary | ICD-10-CM | POA: Diagnosis not present

## 2021-08-10 NOTE — ED Provider Notes (Signed)
[facility]    CSN: [id number] Arrival date & time: 08/10/21  1606      History   Chief Complaint Chief Complaint  Patient presents with   Covid Test for Travel    HPI Johnny [name] is a 61 y.o. male.   Patient presents requesting COVID testing for travel to [place].  Endorses that he leaves on Wednesday.  Denies known contact.  Denies all symptoms.  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   Wears dentures     Patient Active Problem List   Diagnosis Date Noted   Volume overload 06/01/2021   Acute respiratory failure with hypoxia and hypercapnia ([place]) 05/29/2021   ESRD (end stage renal disease) ([facility])    CKD (chronic kidney disease) stage 4, GFR 15-29 ml/min (HCC)    Elevated brain natriuretic peptide (BNP) level    Acute on chronic congestive heart failure (HCC)    Acute pulmonary edema (HCC)    Tubular adenoma of Colon 07/22/2020   Iron deficiency anemia due to chronic blood loss 06/17/2020   Morbid obesity ([place]) 12/31/2019   Gastrointestinal hemorrhage 06/11/2019   Normocytic anemia    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   Pannus, abdominal 12/28/2017   Acquired lymphedema, bilateral thighs 12/13/2017   Scrotal mass 12/13/2017   History of anemia due to chronic kidney disease    Hyperkalemia 08/27/2015   Sleep apnea 05/21/2013   Diverticulosis of colon 01/31/2012   Venous stasis of lower extremity 12/02/2011   LVH (left ventricular hypertrophy) 07/28/2009   CKD stage 4 secondary to hypertension ([place]) 07/28/2009   Essential hypertension 04/21/2006    Past Surgical History:   Procedure Laterality Date   ABDOMINOPLASTY/PANNICULECTOMY WITH LIPOSUCTION Left 10/11/2018   Procedure: Excision of left thigh lipodystrophy;  Surgeon: Wallace Going, DO;  Location: [facility];  Service: Plastics;  Laterality: Left;   AV FISTULA PLACEMENT Left 06/03/2021   Procedure: LEFT RADIOCEPHALIC ARTERIOVENOUS (AV) FISTULA CREATION;  Surgeon: [name], MD;  Location: [facility];  Service: Vascular;  Laterality: Left;   COLONOSCOPY  01/31/2012   Procedure: COLONOSCOPY;  Surgeon: Inda Castle, MD;  Location: [facility];  Service: Endoscopy;  Laterality: N/A;  bmi is 49   COLONOSCOPY     COLONOSCOPY WITH PROPOFOL N/A 06/13/2019   Procedure: COLONOSCOPY WITH PROPOFOL;  Surgeon: Jackquline Denmark, MD;  Location: [facility] ENDOSCOPY;  Service: Endoscopy;  Laterality: N/A;   IR FLUORO GUIDE CV LINE RIGHT  05/30/2021   IR US GUIDE VASC ACCESS RIGHT  05/30/2021   MULTIPLE TOOTH EXTRACTIONS     PANNICULECTOMY Bilateral 04/20/2018   Procedure: Excision of right inner thighs;  Surgeon: Wallace Going, DO;  Location: [facility];  Service: Plastics;  Laterality: Bilateral;  Excision of right inner thighs   UPPER GASTROINTESTINAL ENDOSCOPY  06/05/2020   [facility] Medications    Prior to Admission medications   Medication Sig Start Date End Date Taking? Authorizing Provider  amLODipine (NORVASC) 5 MG tablet Take 1 tablet (5 mg total) by mouth daily. 06/04/21  Yes Bonnielee Haff, MD  ascorbic acid (VITAMIN C) 500 MG tablet Take 1 tablet (500 mg total) by mouth daily. 03/13/19  Yes Sheikh, Omair Latif, DO  calcitRIOL (ROCALTROL) 0.5 MCG capsule Take 2 capsules (1 mcg total) by mouth daily. 06/05/21  Yes Bonnielee Haff, MD  carvedilol (COREG) 12.5 MG tablet Take 1 tablet (12.5 mg total) by mouth 2 (two) times daily with a meal. 06/04/21  Yes Bonnielee Haff, MD  cholecalciferol (VITAMIN D3) 25 MCG (1000 UNIT) tablet Take 1,000 Units by mouth daily.   Yes [provider]  zinc sulfate 220  (50 Zn) MG capsule Take 1 capsule (220 mg total) by mouth daily. 03/13/19  Yes Kerney Elbe, DO    Family History Family History  Problem Relation Age of Onset   Breast cancer Mother    Hypertension Mother    Colon cancer Father        died at 3   Hypertension Brother    Esophageal cancer Neg Hx    Stomach cancer Neg Hx    Liver disease Neg Hx    Rectal cancer Neg Hx     Social History Social History   Tobacco Use   Smoking status: Never   Smokeless tobacco: Never  Vaping Use   Vaping Use: Never used  Substance Use Topics   Alcohol use: Yes    Comment: social-once a week   Drug use: No    Comment: stopped marijuana about Sep or Oct 2013     Allergies   Lisinopril   Review of Systems Review of Systems  Constitutional: Negative.   HENT: Negative.    Respiratory: Negative.    Cardiovascular: Negative.   Gastrointestinal: Negative.   Skin: Negative.   Neurological: Negative.      Physical Exam Triage Vital Signs ED Triage Vitals  Enc Vitals Group     BP 08/10/21 1712 121/79     Pulse Rate 08/10/21 1712 85     Resp 08/10/21 1712 16     Temp 08/10/21 1712 98.1 F (36.7 C)     Temp Source 08/10/21 1712 Oral     SpO2 08/10/21 1712 98 %     Weight 08/10/21 1710 280 lb (127 kg)     Height 08/10/21 1710 '5\' 9"'$  (1.753 m)     Head Circumference --      Peak Flow --      Pain Score 08/10/21 1710 0     Pain Loc --      Pain Edu? --      Excl. in [place]? --    No data found.  Updated Vital Signs BP 121/79 (BP Location: Right Arm)   Pulse 85   Temp 98.1 F (36.7 C) (Oral)   Resp 16   Ht '5\' 9"'$  (1.753 m)   Wt 280 lb (127 kg)   SpO2 98%   BMI 41.35 kg/m   Visual Acuity Right Eye Distance:   Left Eye Distance:   Bilateral Distance:    Right Eye Near:   Left Eye Near:    Bilateral Near:     Physical Exam Constitutional:      Appearance: Normal appearance.  HENT:     Head: Normocephalic.  Eyes:     Extraocular Movements: Extraocular  movements intact.  Cardiovascular:     Rate and Rhythm: Normal rate and regular rhythm.     Pulses: Normal pulses.     Heart sounds: Normal heart sounds.  Pulmonary:  Effort: Pulmonary effort is normal.     Breath sounds: Normal breath sounds.  Skin:    General: Skin is warm and dry.  Neurological:     Mental Status: He is alert and oriented to person, place, and time. Mental status is at baseline.  Psychiatric:        Mood and Affect: Mood normal.        Behavior: Behavior normal.      UC Treatments / Results  Labs (all labs ordered are listed, but only abnormal results are displayed) Labs Reviewed  SARS CORONAVIRUS 2 (TAT 6-24 HRS)    EKG   Radiology No results found.  Procedures Procedures (including critical care time)  Medications Ordered in UC Medications - No data to display  Initial Impression / Assessment and Plan / UC Course  I have reviewed the triage vital signs and the nursing notes.  Pertinent labs & imaging results that were available during my care of the patient were reviewed by me and considered in my medical decision making (see chart for details).  Encounter for laboratory testing for COVID-19 virus  COVID testing pending, vital signs are stable, exam unremarkable Final Clinical Impressions(s) / UC Diagnoses   Final diagnoses:  Encounter for laboratory testing for COVID-19 virus     Discharge Instructions      Covid test pending 24 hours, you will be called for positive results only,   You may see test results on your Mychart account    ED Prescriptions   None    PDMP not reviewed this encounter.   Hans Eden, NP 08/10/21 1734

## 2021-08-10 NOTE — ED Triage Notes (Signed)
Patient needs Covid testing for travel. No symptoms or known sick exposure.

## 2021-08-10 NOTE — Discharge Instructions (Signed)
Covid test pending 24 hours, you will be called for positive results only,   You may see test results on your Mychart account

## 2021-08-11 DIAGNOSIS — N186 End stage renal disease: Secondary | ICD-10-CM | POA: Diagnosis not present

## 2021-08-11 DIAGNOSIS — D631 Anemia in chronic kidney disease: Secondary | ICD-10-CM | POA: Diagnosis not present

## 2021-08-11 DIAGNOSIS — T8249XA Other complication of vascular dialysis catheter, initial encounter: Secondary | ICD-10-CM | POA: Diagnosis not present

## 2021-08-11 DIAGNOSIS — N2581 Secondary hyperparathyroidism of renal origin: Secondary | ICD-10-CM | POA: Diagnosis not present

## 2021-08-11 DIAGNOSIS — Z992 Dependence on renal dialysis: Secondary | ICD-10-CM | POA: Diagnosis not present

## 2021-08-11 DIAGNOSIS — N185 Chronic kidney disease, stage 5: Secondary | ICD-10-CM | POA: Diagnosis not present

## 2021-08-11 DIAGNOSIS — D688 Other specified coagulation defects: Secondary | ICD-10-CM | POA: Diagnosis not present

## 2021-08-11 LAB — SARS CORONAVIRUS 2 (TAT 6-24 HRS): SARS Coronavirus 2: NEGATIVE

## 2021-08-18 DIAGNOSIS — N186 End stage renal disease: Secondary | ICD-10-CM | POA: Diagnosis not present

## 2021-08-18 DIAGNOSIS — D631 Anemia in chronic kidney disease: Secondary | ICD-10-CM | POA: Diagnosis not present

## 2021-08-18 DIAGNOSIS — Z992 Dependence on renal dialysis: Secondary | ICD-10-CM | POA: Diagnosis not present

## 2021-08-18 DIAGNOSIS — N2581 Secondary hyperparathyroidism of renal origin: Secondary | ICD-10-CM | POA: Diagnosis not present

## 2021-08-18 DIAGNOSIS — T8249XA Other complication of vascular dialysis catheter, initial encounter: Secondary | ICD-10-CM | POA: Diagnosis not present

## 2021-08-18 DIAGNOSIS — D688 Other specified coagulation defects: Secondary | ICD-10-CM | POA: Diagnosis not present

## 2021-08-18 DIAGNOSIS — N185 Chronic kidney disease, stage 5: Secondary | ICD-10-CM | POA: Diagnosis not present

## 2021-08-19 DIAGNOSIS — N186 End stage renal disease: Secondary | ICD-10-CM | POA: Diagnosis not present

## 2021-08-19 DIAGNOSIS — D688 Other specified coagulation defects: Secondary | ICD-10-CM | POA: Diagnosis not present

## 2021-08-19 DIAGNOSIS — Z992 Dependence on renal dialysis: Secondary | ICD-10-CM | POA: Diagnosis not present

## 2021-08-19 DIAGNOSIS — N2581 Secondary hyperparathyroidism of renal origin: Secondary | ICD-10-CM | POA: Diagnosis not present

## 2021-08-19 DIAGNOSIS — N185 Chronic kidney disease, stage 5: Secondary | ICD-10-CM | POA: Diagnosis not present

## 2021-08-19 DIAGNOSIS — T8249XA Other complication of vascular dialysis catheter, initial encounter: Secondary | ICD-10-CM | POA: Diagnosis not present

## 2021-08-19 DIAGNOSIS — D631 Anemia in chronic kidney disease: Secondary | ICD-10-CM | POA: Diagnosis not present

## 2021-08-21 DIAGNOSIS — Z992 Dependence on renal dialysis: Secondary | ICD-10-CM | POA: Diagnosis not present

## 2021-08-21 DIAGNOSIS — N186 End stage renal disease: Secondary | ICD-10-CM | POA: Diagnosis not present

## 2021-08-21 DIAGNOSIS — T8249XA Other complication of vascular dialysis catheter, initial encounter: Secondary | ICD-10-CM | POA: Diagnosis not present

## 2021-08-21 DIAGNOSIS — N2581 Secondary hyperparathyroidism of renal origin: Secondary | ICD-10-CM | POA: Diagnosis not present

## 2021-08-21 DIAGNOSIS — I120 Hypertensive chronic kidney disease with stage 5 chronic kidney disease or end stage renal disease: Secondary | ICD-10-CM | POA: Diagnosis not present

## 2021-08-21 DIAGNOSIS — D631 Anemia in chronic kidney disease: Secondary | ICD-10-CM | POA: Diagnosis not present

## 2021-08-21 DIAGNOSIS — N185 Chronic kidney disease, stage 5: Secondary | ICD-10-CM | POA: Diagnosis not present

## 2021-08-21 DIAGNOSIS — D688 Other specified coagulation defects: Secondary | ICD-10-CM | POA: Diagnosis not present

## 2021-08-24 DIAGNOSIS — D631 Anemia in chronic kidney disease: Secondary | ICD-10-CM | POA: Diagnosis not present

## 2021-08-24 DIAGNOSIS — N185 Chronic kidney disease, stage 5: Secondary | ICD-10-CM | POA: Diagnosis not present

## 2021-08-24 DIAGNOSIS — D688 Other specified coagulation defects: Secondary | ICD-10-CM | POA: Diagnosis not present

## 2021-08-24 DIAGNOSIS — Z992 Dependence on renal dialysis: Secondary | ICD-10-CM | POA: Diagnosis not present

## 2021-08-24 DIAGNOSIS — N2581 Secondary hyperparathyroidism of renal origin: Secondary | ICD-10-CM | POA: Diagnosis not present

## 2021-08-24 DIAGNOSIS — T8249XA Other complication of vascular dialysis catheter, initial encounter: Secondary | ICD-10-CM | POA: Diagnosis not present

## 2021-08-24 DIAGNOSIS — N186 End stage renal disease: Secondary | ICD-10-CM | POA: Diagnosis not present

## 2021-08-26 ENCOUNTER — Encounter (HOSPITAL_COMMUNITY): Payer: Self-pay | Admitting: Emergency Medicine

## 2021-08-26 ENCOUNTER — Emergency Department (HOSPITAL_COMMUNITY)
Admission: EM | Admit: 2021-08-26 | Discharge: 2021-08-27 | Disposition: A | Discharge status: Home / Self Care | Payer: Medicare Other | Attending: Emergency Medicine | Admitting: Emergency Medicine

## 2021-08-26 DIAGNOSIS — I953 Hypotension of hemodialysis: Secondary | ICD-10-CM | POA: Insufficient documentation

## 2021-08-26 DIAGNOSIS — R609 Edema, unspecified: Secondary | ICD-10-CM | POA: Diagnosis not present

## 2021-08-26 DIAGNOSIS — R109 Unspecified abdominal pain: Secondary | ICD-10-CM | POA: Diagnosis not present

## 2021-08-26 DIAGNOSIS — I959 Hypotension, unspecified: Secondary | ICD-10-CM | POA: Diagnosis not present

## 2021-08-26 DIAGNOSIS — D688 Other specified coagulation defects: Secondary | ICD-10-CM | POA: Diagnosis not present

## 2021-08-26 DIAGNOSIS — D631 Anemia in chronic kidney disease: Secondary | ICD-10-CM | POA: Diagnosis not present

## 2021-08-26 DIAGNOSIS — Z992 Dependence on renal dialysis: Secondary | ICD-10-CM | POA: Insufficient documentation

## 2021-08-26 DIAGNOSIS — N186 End stage renal disease: Secondary | ICD-10-CM | POA: Diagnosis not present

## 2021-08-26 DIAGNOSIS — R1900 Intra-abdominal and pelvic swelling, mass and lump, unspecified site: Secondary | ICD-10-CM | POA: Diagnosis not present

## 2021-08-26 DIAGNOSIS — I120 Hypertensive chronic kidney disease with stage 5 chronic kidney disease or end stage renal disease: Secondary | ICD-10-CM | POA: Diagnosis not present

## 2021-08-26 DIAGNOSIS — R0602 Shortness of breath: Secondary | ICD-10-CM | POA: Diagnosis not present

## 2021-08-26 DIAGNOSIS — R1030 Lower abdominal pain, unspecified: Secondary | ICD-10-CM | POA: Diagnosis not present

## 2021-08-26 DIAGNOSIS — N2581 Secondary hyperparathyroidism of renal origin: Secondary | ICD-10-CM | POA: Diagnosis not present

## 2021-08-26 DIAGNOSIS — T8249XA Other complication of vascular dialysis catheter, initial encounter: Secondary | ICD-10-CM | POA: Diagnosis not present

## 2021-08-26 DIAGNOSIS — I10 Essential (primary) hypertension: Secondary | ICD-10-CM | POA: Diagnosis not present

## 2021-08-26 DIAGNOSIS — R42 Dizziness and giddiness: Secondary | ICD-10-CM | POA: Diagnosis not present

## 2021-08-26 DIAGNOSIS — R7989 Other specified abnormal findings of blood chemistry: Secondary | ICD-10-CM | POA: Insufficient documentation

## 2021-08-26 DIAGNOSIS — N185 Chronic kidney disease, stage 5: Secondary | ICD-10-CM | POA: Diagnosis not present

## 2021-08-26 NOTE — ED Triage Notes (Signed)
Per EMS, from home, had dialyais today, could only get 1/2 his treatment b/c pressures were dropping.  Around 7pm started having dizziness/SOB.  No AC in home.  Pt also reports dizziness when up and moving   92/45(initial)  130/90 (in truck) CBG 117

## 2021-08-26 NOTE — ED Provider Triage Note (Cos[place] Needed)
Emergency Medicine Provider Triage Evaluation Note  Johnny [name] , a 61 y.o. male  was evaluated in triage.  Pt arrives via EMS complaining of shortness of breath.  Patient was at dialysis today but they could only take off half of his usual volume because of hypotension.  Patient reports when he got home this evening he continued to feel short of breath and reports that he feels very lightheaded when he gets up.  Denies any chest pain.  No abdominal pain.  Reports he had a normal full treatment on Monday.  Review of Systems  Positive: Shortness of breath, lightheadedness Negative: Chest pain, abdominal pain  Physical Exam  Ht 5' 8.5" (1.74 m)   Wt 127 kg   BMI 41.95 kg/m  Gen:   Awake, no distress   Resp:  Normal effort, some decreased air movement, dialysis catheter noted in the right upper chest MSK:   Moves extremities without difficulty  Other:    Medical Decision Making  Medically screening exam initiated at 11:56 PM.  Appropriate orders placed.  Johnny [name] was informed that the remainder of the evaluation will be completed by another provider, this initial triage assessment does not replace that evaluation, and the importance of remaining in the ED until their evaluation is complete.  Patient unable to complete analysis today due to hypotension, presenting with lightheadedness and shortness of breath.  Labs and chest x-ray ordered heart rate and work-up initiated from triage.   Johnny [name], [place] 08/27/21 0003

## 2021-08-27 ENCOUNTER — Emergency Department (HOSPITAL_COMMUNITY): Payer: Medicare Other

## 2021-08-27 DIAGNOSIS — I953 Hypotension of hemodialysis: Secondary | ICD-10-CM | POA: Diagnosis not present

## 2021-08-27 DIAGNOSIS — R0602 Shortness of breath: Secondary | ICD-10-CM | POA: Diagnosis not present

## 2021-08-27 DIAGNOSIS — R109 Unspecified abdominal pain: Secondary | ICD-10-CM | POA: Diagnosis not present

## 2021-08-27 LAB — HEPATIC FUNCTION PANEL
ALT: 19 U/L (ref 0–44)
AST: 20 U/L (ref 15–41)
Albumin: 2.9 g/dL — ABNORMAL LOW (ref 3.5–5.0)
Alkaline Phosphatase: 55 U/L (ref 38–126)
Bilirubin, Direct: 0.1 mg/dL (ref 0.0–0.2)
Indirect Bilirubin: 0.5 mg/dL (ref 0.3–0.9)
Total Bilirubin: 0.6 mg/dL (ref 0.3–1.2)
Total Protein: 7.4 g/dL (ref 6.5–8.1)

## 2021-08-27 LAB — CBC WITH DIFFERENTIAL/PLATELET
Abs Immature Granulocytes: 0.03 10*3/uL (ref 0.00–0.07)
Basophils Absolute: 0 10*3/uL (ref 0.0–0.1)
Basophils Relative: 0 %
Eosinophils Absolute: 0.1 10*3/uL (ref 0.0–0.5)
Eosinophils Relative: 1 %
HCT: 29.7 % — ABNORMAL LOW (ref 39.0–52.0)
Hemoglobin: 10 g/dL — ABNORMAL LOW (ref 13.0–17.0)
Immature Granulocytes: 0 %
Lymphocytes Relative: 10 %
Lymphs Abs: 0.9 10*3/uL (ref 0.7–4.0)
MCH: 31.8 pg (ref 26.0–34.0)
MCHC: 33.7 g/dL (ref 30.0–36.0)
MCV: 94.6 fL (ref 80.0–100.0)
Monocytes Absolute: 0.9 10*3/uL (ref 0.1–1.0)
Monocytes Relative: 10 %
Neutro Abs: 7 10*3/uL (ref 1.7–7.7)
Neutrophils Relative %: 79 %
Platelets: 174 10*3/uL (ref 150–400)
RBC: 3.14 MIL/uL — ABNORMAL LOW (ref 4.22–5.81)
RDW: 16.6 % — ABNORMAL HIGH (ref 11.5–15.5)
WBC: 8.9 10*3/uL (ref 4.0–10.5)
nRBC: 0 % (ref 0.0–0.2)

## 2021-08-27 LAB — BASIC METABOLIC PANEL
Anion gap: 15 (ref 5–15)
BUN: 30 mg/dL — ABNORMAL HIGH (ref 6–20)
CO2: 24 mmol/L (ref 22–32)
Calcium: 8.8 mg/dL — ABNORMAL LOW (ref 8.9–10.3)
Chloride: 94 mmol/L — ABNORMAL LOW (ref 98–111)
Creatinine, Ser: 8.35 mg/dL — ABNORMAL HIGH (ref 0.61–1.24)
GFR, Estimated: 7 mL/min — ABNORMAL LOW (ref >60)
Glucose, Bld: 103 mg/dL — ABNORMAL HIGH (ref 70–99)
Potassium: 3.6 mmol/L (ref 3.5–5.1)
Sodium: 133 mmol/L — ABNORMAL LOW (ref 135–145)

## 2021-08-27 LAB — LACTIC ACID, PLASMA: Lactic Acid, Venous: 1.8 mmol/L (ref 0.5–1.9)

## 2021-08-27 MED ORDER — SODIUM CHLORIDE 0.9 % IV BOLUS
500.0000 mL | Freq: Once | INTRAVENOUS | Status: AC
  Administered 2021-08-27: 500 mL via INTRAVENOUS

## 2021-08-27 NOTE — ED Provider Notes (Signed)
[facility] EMERGENCY DEPARTMENT Provider Note   CSN: [id number] Arrival date & time: 08/26/21  2346     History  Chief Complaint  Patient presents with   Dizziness   Shortness of Breath    Johnny [name] is a 61 y.o. male.  61 yo M with a chief complaint of feeling lightheaded.  The patient's that he had dialysis this morning as usual and had no issues with that he then went over to his girlfriend's house and was feeling a bit lightheaded.  He has been having some lower abdominal discomfort with this that goes down into his testicles.  Has had some significant swelling to his scrotum and lower abdomen since he was diagnosed with significant renal dysfunction and started on dialysis about 3 months ago.  He denies any fevers or chills denies nausea vomiting or diarrhea.  Feels like he has been able to eat and drink normally.   Dizziness Associated symptoms: shortness of breath   Shortness of Breath      Home Medications Prior to Admission medications   Medication Sig Start Date End Date Taking? Authorizing Provider  amLODipine (NORVASC) 5 MG tablet Take 1 tablet (5 mg total) by mouth daily. 06/04/21   Bonnielee Haff, MD  ascorbic acid (VITAMIN C) 500 MG tablet Take 1 tablet (500 mg total) by mouth daily. 03/13/19   Raiford Noble Latif, DO  calcitRIOL (ROCALTROL) 0.5 MCG capsule Take 2 capsules (1 mcg total) by mouth daily. 06/05/21   Bonnielee Haff, MD  carvedilol (COREG) 12.5 MG tablet Take 1 tablet (12.5 mg total) by mouth 2 (two) times daily with a meal. 06/04/21   Bonnielee Haff, MD  cholecalciferol (VITAMIN D3) 25 MCG (1000 UNIT) tablet Take 1,000 Units by mouth daily.    [provider]  zinc sulfate 220 (50 Zn) MG capsule Take 1 capsule (220 mg total) by mouth daily. 03/13/19   Raiford Noble Latif, DO      Allergies    Lisinopril    Review of Systems   Review of Systems  Respiratory:  Positive for shortness of breath.   Neurological:  Positive  for dizziness.    Physical Exam Updated Vital Signs BP 118/64   Pulse 85   Temp 98.3 F (36.8 C) (Oral)   Resp (!) 26   Ht 5' 8.5" (1.74 m)   Wt 127 kg   SpO2 100%   BMI 41.95 kg/m  Physical Exam Vitals and nursing note reviewed.  Constitutional:      Appearance: He is well-developed.  HENT:     Head: Normocephalic and atraumatic.  Eyes:     Pupils: Pupils are equal, round, and reactive to light.  Neck:     Vascular: No JVD.  Cardiovascular:     Rate and Rhythm: Normal rate and regular rhythm.     Heart sounds: No murmur heard.    No friction rub. No gallop.  Pulmonary:     Effort: No respiratory distress.     Breath sounds: No wheezing.  Abdominal:     General: There is no distension.     Tenderness: There is no abdominal tenderness. There is no guarding or rebound.  Genitourinary:    Comments: Significant scrotal swelling appears to be more left than right-sided.  No appreciable erythema or break in the skin. Musculoskeletal:        General: Normal range of motion.     Cervical back: Normal range of motion and neck supple.  Skin:  Coloration: Skin is not pale.     Findings: No rash.  Neurological:     Mental Status: He is alert and oriented to person, place, and time.  Psychiatric:        Behavior: Behavior normal.     ED Results / Procedures / Treatments   Labs (all labs ordered are listed, but only abnormal results are displayed) Labs Reviewed  BASIC METABOLIC PANEL - Abnormal; Notable for the following components:      Result Value   Sodium 133 (*)    Chloride 94 (*)    Glucose, Bld 103 (*)    BUN 30 (*)    Creatinine, Ser 8.35 (*)    Calcium 8.8 (*)    GFR, Estimated 7 (*)    All other components within normal limits  CBC WITH DIFFERENTIAL/PLATELET - Abnormal; Notable for the following components:   RBC 3.14 (*)    Hemoglobin 10.0 (*)    HCT 29.7 (*)    RDW 16.6 (*)    All other components within normal limits  HEPATIC FUNCTION PANEL -  Abnormal; Notable for the following components:   Albumin 2.9 (*)    All other components within normal limits  CULTURE, BLOOD (ROUTINE X 2)  CULTURE, BLOOD (ROUTINE X 2)  LACTIC ACID, PLASMA  LACTIC ACID, PLASMA    EKG EKG Interpretation  Date/Time:  Thursday August 27 2021 00:01:45 EDT Ventricular Rate:  91 PR Interval:  156 QRS Duration: 94 QT Interval:  396 QTC Calculation: 487 R Axis:   19 Text Interpretation: Sinus rhythm with frequent Premature ventricular complexes Low voltage QRS Prolonged QT Abnormal ECG Otherwise no significant change Confirmed by Deno Etienne [phone]) on 08/27/2021 1:58:26 AM  Radiology CT ABDOMEN PELVIS WO CONTRAST  Result Date: 08/27/2021 CLINICAL DATA:  Acute nonlocalized abdominal pain. History of hypotension. EXAM: CT ABDOMEN AND PELVIS WITHOUT CONTRAST TECHNIQUE: Multidetector CT imaging of the abdomen and pelvis was performed following the standard protocol without IV contrast. RADIATION DOSE REDUCTION: This exam was performed according to the departmental dose-optimization program which includes automated exposure control, adjustment of the mA and/or kV according to patient size and/or use of iterative reconstruction technique. COMPARISON:  None Available. FINDINGS: Lower chest: No acute finding. Dialysis catheter into the lower right atrium. Hepatobiliary: No focal liver abnormality.No evidence of biliary obstruction or stone. Pancreas: Solitary coarse calcification in the tail. No evidence of surrounding inflammation or masslike density. Spleen: Unremarkable. Adrenals/Urinary Tract: Negative adrenals. Bilateral renal atrophy, with worse cortical thinning on the left where there is hydroureteronephrosis associated with a left inguinal hernia which contains the lower ureter. Small volume bladder without acute finding. Stomach/Bowel: No obstruction. No appendicitis. Colonic diverticulosis. Vascular/Lymphatic: No acute vascular abnormality. Atheromatous  calcification of the aorta. No mass or adenopathy. Reproductive:No pathologic findings. Other: No ascites or pneumoperitoneum. Musculoskeletal: No acute abnormalities. Lower thoracic spondylosis with bridging osteophytes. IMPRESSION: 1. No acute finding. 2. 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 Electronically Signed   By: Jorje Guild M.D.   On: 08/27/2021 04:56   DG Chest 2 View  Result Date: 08/27/2021 CLINICAL DATA:  Shortness of breath EXAM: CHEST - 2 VIEW COMPARISON:  05/30/2021 FINDINGS: Improved aeration of both lungs since 05/30/2021. There is a right IJ approach central venous catheter with tip the proximal right atrium. There is no focal airspace consolidation. No pleural effusion. IMPRESSION: Improved aeration of both lungs. Electronically Signed   By: Cletus Gash.D.  On: 08/27/2021 00:42    Procedures Procedures    Medications Ordered in ED Medications  sodium chloride 0.9 % bolus 500 mL (0 mLs Intravenous Stopped 08/27/21 0230)    ED Course/ Medical Decision Making/ A&P                           Medical Decision Making Amount and/or Complexity of Data Reviewed Labs: ordered. Radiology: ordered.   61 yo M with a chief complaints of feeling lightheaded.  Patient's blood pressure was noted to be low on arrival here.  He denies any significant fluid withdrawal with dialysis.  Denies any decreased oral intake.  Denies any obvious infectious source but is having some lower abdominal discomfort and some scrotal edema.  We will obtain a CT scan of the abdomen pelvis to better evaluate.  CBC without acute anemia.  No leukocytosis.  Mild elevation to the BUN and creatinine which is to be expected with his history of end-stage renal disease.  Sodium and chloride are mildly low.  We will give a small bolus of IV fluids.  Reassess.  Patient's blood pressure is improved with IV fluids.  He is feeling a bit better.  CT scan with  chronic findings but no acute findings.  We will discharge the patient home.  Have him follow-up with his family doctor.  5:12 AM:  I have discussed the diagnosis/risks/treatment options with the patient.  Evaluation and diagnostic testing in the emergency department does not suggest an emergent condition requiring admission or immediate intervention beyond what has been performed at this time.  They will follow up with  PCP. We also discussed returning to the ED immediately if new or worsening sx occur. We discussed the sx which are most concerning (e.g., sudden worsening pain, fever, inability to tolerate by mouth) that necessitate immediate return. Medications administered to the patient during their visit and any new prescriptions provided to the patient are listed below.  Medications given during this visit Medications  sodium chloride 0.9 % bolus 500 mL (0 mLs Intravenous Stopped 08/27/21 0230)     The patient appears reasonably screen and/or stabilized for discharge and I doubt any other medical condition or other [facility] requiring further screening, evaluation, or treatment in the ED at this time prior to discharge.          Final Clinical Impression(s) / ED Diagnoses Final diagnoses:  Hypotension of hemodialysis    Rx / DC Orders ED Discharge Orders     None         Caley, Volkert, DO 08/27/21 1829

## 2021-08-27 NOTE — ED Notes (Signed)
Patient BP 76/56, Dr. Tyrone Nine at bedside.

## 2021-08-27 NOTE — Discharge Instructions (Signed)
Please return for worsening or recurrent symptoms.  Return for worsening abdominal discomfort.

## 2021-08-27 NOTE — ED Notes (Signed)
Patient resting in bed, denies any needs at this time. Call light in reach.

## 2021-08-28 DIAGNOSIS — N186 End stage renal disease: Secondary | ICD-10-CM | POA: Diagnosis not present

## 2021-08-28 DIAGNOSIS — Z992 Dependence on renal dialysis: Secondary | ICD-10-CM | POA: Diagnosis not present

## 2021-08-28 DIAGNOSIS — T8249XA Other complication of vascular dialysis catheter, initial encounter: Secondary | ICD-10-CM | POA: Diagnosis not present

## 2021-08-28 DIAGNOSIS — D688 Other specified coagulation defects: Secondary | ICD-10-CM | POA: Diagnosis not present

## 2021-08-28 DIAGNOSIS — N185 Chronic kidney disease, stage 5: Secondary | ICD-10-CM | POA: Diagnosis not present

## 2021-08-28 DIAGNOSIS — N2581 Secondary hyperparathyroidism of renal origin: Secondary | ICD-10-CM | POA: Diagnosis not present

## 2021-08-28 DIAGNOSIS — D631 Anemia in chronic kidney disease: Secondary | ICD-10-CM | POA: Diagnosis not present

## 2021-08-31 DIAGNOSIS — N186 End stage renal disease: Secondary | ICD-10-CM | POA: Diagnosis not present

## 2021-08-31 DIAGNOSIS — D688 Other specified coagulation defects: Secondary | ICD-10-CM | POA: Diagnosis not present

## 2021-08-31 DIAGNOSIS — T8249XA Other complication of vascular dialysis catheter, initial encounter: Secondary | ICD-10-CM | POA: Diagnosis not present

## 2021-08-31 DIAGNOSIS — Z992 Dependence on renal dialysis: Secondary | ICD-10-CM | POA: Diagnosis not present

## 2021-08-31 DIAGNOSIS — N2581 Secondary hyperparathyroidism of renal origin: Secondary | ICD-10-CM | POA: Diagnosis not present

## 2021-08-31 DIAGNOSIS — N185 Chronic kidney disease, stage 5: Secondary | ICD-10-CM | POA: Diagnosis not present

## 2021-08-31 DIAGNOSIS — D631 Anemia in chronic kidney disease: Secondary | ICD-10-CM | POA: Diagnosis not present

## 2021-09-01 ENCOUNTER — Encounter: Payer: Self-pay | Admitting: Vascular Surgery

## 2021-09-01 ENCOUNTER — Other Ambulatory Visit: Payer: Self-pay

## 2021-09-01 ENCOUNTER — Ambulatory Visit (INDEPENDENT_AMBULATORY_CARE_PROVIDER_SITE_OTHER): Payer: Medicaid Other | Admitting: Vascular Surgery

## 2021-09-01 ENCOUNTER — Ambulatory Visit (HOSPITAL_COMMUNITY)
Admission: RE | Admit: 2021-09-01 | Discharge: 2021-09-01 | Disposition: A | Discharge status: Home / Self Care | Payer: Medicare Other | Source: Ambulatory Visit | Attending: Vascular Surgery | Admitting: Vascular Surgery

## 2021-09-01 VITALS — BP 103/67 | HR 83 | Temp 97.5°F | Resp 16 | Ht 69.0 in | Wt 287.0 lb

## 2021-09-01 DIAGNOSIS — N186 End stage renal disease: Secondary | ICD-10-CM

## 2021-09-01 LAB — CULTURE, BLOOD (ROUTINE X 2)
Culture: NO GROWTH
Culture: NO GROWTH
Special Requests: ADEQUATE

## 2021-09-01 NOTE — Progress Notes (Signed)
[name] MRN: [id number] DOB: 10-22-1960 Sex: male  REASON FOR VISIT: Postop left arm AVF  HPI: Johnny [name] is a 61 y.o. male with end-stage renal disease status post left radiocephalic AV fistula on 06/16/9561.  Patient has not started using the fistula at this time.  He is using a right IJ tunnel catheter.  No signs of steal in the left hand.  On dialysis Monday Wednesday Friday.  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   Wears dentures     Past Surgical History:  Procedure Laterality Date   ABDOMINOPLASTY/PANNICULECTOMY WITH LIPOSUCTION Left 10/11/2018   Procedure: Excision of left thigh lipodystrophy;  Surgeon: Wallace Going, DO;  Location: [facility];  Service: Plastics;  Laterality: Left;   AV FISTULA PLACEMENT Left 06/03/2021   Procedure: LEFT RADIOCEPHALIC ARTERIOVENOUS (AV) FISTULA CREATION;  Surgeon: [name], MD;  Location: [place];  Service: Vascular;  Laterality: Left;   COLONOSCOPY  01/31/2012   Procedure: COLONOSCOPY;  Surgeon: Inda Castle, MD;  Location: [facility];  Service: Endoscopy;  Laterality: N/A;  bmi is 77   COLONOSCOPY     COLONOSCOPY WITH PROPOFOL N/A 06/13/2019   Procedure: COLONOSCOPY WITH PROPOFOL;  Surgeon: Jackquline Denmark, MD;  Location: [facility];  Service: Endoscopy;  Laterality: N/A;   IR FLUORO GUIDE CV LINE RIGHT  05/30/2021   IR US GUIDE VASC ACCESS RIGHT  05/30/2021   MULTIPLE TOOTH EXTRACTIONS     PANNICULECTOMY Bilateral 04/20/2018   Procedure: Excision of right inner thighs;  Surgeon:  Wallace Going, DO;  Location: [place];  Service: Plastics;  Laterality: Bilateral;  Excision of right inner thighs   UPPER GASTROINTESTINAL ENDOSCOPY  06/05/2020   Lyndel Safe    Family History  Problem Relation Age of Onset   Breast cancer Mother    Hypertension Mother    Colon cancer Father        died at 42   Hypertension Brother    Esophageal cancer Neg Hx    Stomach cancer Neg Hx    Liver disease Neg Hx    Rectal cancer Neg Hx     SOCIAL HISTORY: Social History   Tobacco Use   Smoking status: Never   Smokeless tobacco: Never  Substance Use Topics   Alcohol use: Yes    Comment: social-once a week    Allergies  Allergen Reactions   Lisinopril Swelling and Other (See Comments)    Angioedema    Current Outpatient Medications  Medication Sig Dispense Refill   amLODipine (NORVASC) 5 MG tablet Take 1 tablet (5 mg total) by mouth daily. 30 tablet 0   ascorbic acid (VITAMIN C) 500 MG tablet Take 1 tablet (500 mg total) by mouth daily. 30 tablet 0   calcitRIOL (ROCALTROL) 0.5 MCG capsule Take 2 capsules (1 mcg total) by mouth daily. 30 capsule 0   cholecalciferol (VITAMIN D3) 25 MCG (1000 UNIT) tablet Take 1,000 Units by mouth daily.     zinc sulfate 220 (  50 Zn) MG capsule Take 1 capsule (220 mg total) by mouth daily. 14 capsule 0   carvedilol (COREG) 12.5 MG tablet Take 1 tablet (12.5 mg total) by mouth 2 (two) times daily with a meal. (Patient not taking: Reported on 09/01/2021) 60 tablet 0   No current facility-administered medications for this visit.    REVIEW OF SYSTEMS:  '[X]'$  denotes positive finding, '[ ]'$  denotes negative finding Cardiac  Comments:  Chest pain or chest pressure:    Shortness of breath upon exertion:    Short of breath when lying flat:    Irregular heart rhythm:        Vascular    Pain in calf, thigh, or hip brought on by ambulation:    Pain in feet at night that wakes you up from your sleep:     Blood clot in your veins:    Leg swelling:          Pulmonary    Oxygen at home:    Productive cough:     Wheezing:         Neurologic    Sudden weakness in arms or legs:     Sudden numbness in arms or legs:     Sudden onset of difficulty speaking or slurred speech:    Temporary loss of vision in one eye:     Problems with dizziness:         Gastrointestinal    Blood in stool:     Vomited blood:         Genitourinary    Burning when urinating:     Blood in urine:        Psychiatric    Major depression:         Hematologic    Bleeding problems:    Problems with blood clotting too easily:        Skin    Rashes or ulcers:        Constitutional    Fever or chills:      PHYSICAL EXAM: Vitals:   09/01/21 1600  BP: 103/67  Pulse: 83  Resp: 16  Temp: (!) 97.5 F (36.4 C)  TempSrc: Temporal  SpO2: 95%  Weight: 287 lb (130.2 kg)  Height: '5\' 9"'$  (1.753 m)    GENERAL: The patient is a well-nourished male, in no acute distress. The vital signs are documented above. CARDIAC: There is a regular rate and rhythm.  VASCULAR:  Left radiocephalic AV fistula with excellent thrill Left radial pulse palpable at the wrist PULMONARY: No respiratory distress. ABDOMEN: Soft and non-tender. MUSCULOSKELETAL: There are no major deformities or cyanosis. NEUROLOGIC: No focal weakness or paresthesias are detected. PSYCHIATRIC: The patient has a normal affect.  DATA:   DIALYSIS ACCESS   Patient Name:  Johnny [name]  Date of Exam:   09/01/2021  Medical Rec #: [id number]      Accession #:    [id number]  Date of Birth: August 28, 1960      Patient Gender: M  Patient Age:   66 years  Exam Location:  [facility]  Procedure:      VAS US DUPLEX DIALYSIS ACCESS (AVF, AVG)  Referring Phys: Monica Martinez    ---------------------------------------------------------------------------  -----     Reason for Exam: Routine follow up.   Access Site: Left Upper Extremity.   Access Type: Radial-cephalic AVF.   History:  Created on 06/03/21.   Performing Technologist: Ralene Cork RVT      Examination Guidelines: A  complete evaluation includes B-mode imaging,  spectral  Doppler, color Doppler, and power Doppler as needed of all accessible  portions  of each vessel. Unilateral testing is considered an integral part of a  complete  examination. Limited examinations for reoccurring indications may be  performed  as noted.      Findings:  +--------------------+----------+-----------------+--------+  AVF                 PSV (cm/s)Flow Vol (mL/min)Comments  +--------------------+----------+-----------------+--------+  Native artery inflow   186           858                 +--------------------+----------+-----------------+--------+  AVF Anastomosis        570                               +--------------------+----------+-----------------+--------+      +------------+----------+-------------+----------+--------------+  OUTFLOW VEINPSV (cm/s)Diameter (cm)Depth (cm)   Describe     +------------+----------+-------------+----------+--------------+  Prox Forearm    93        0.52        0.23   branch 0.35 cm  +------------+----------+-------------+----------+--------------+  Mid Forearm    186        0.44        0.28   branch 0.39 cm  +------------+----------+-------------+----------+--------------+  Dist Forearm   249        0.49        0.22   branch 0.28 cm  +------------+----------+-------------+----------+--------------+          Summary:  Patent left RCAVF with no stenosis or thrombus.  Branches noted as above.   Assessment/Plan:  61 year old male status post left radiocephalic AV fistula on 9/51/8841 for end-stage renal disease.  On exam the fistula has an excellent thrill.  He does have multiple branches as noted above on the duplex and the main course of the vein measures around 4.5 to 5 mm.  I briefly discussed the option of sidebranch ligation  which he is not interested in at this time.  Also discussed he could try using an exercise ball daily for the next month and I will bring him back for a duplex.  No signs of steal.     [name], MD [facility] Office: [phone]

## 2021-09-02 DIAGNOSIS — Z992 Dependence on renal dialysis: Secondary | ICD-10-CM | POA: Diagnosis not present

## 2021-09-02 DIAGNOSIS — N186 End stage renal disease: Secondary | ICD-10-CM | POA: Diagnosis not present

## 2021-09-02 DIAGNOSIS — N2581 Secondary hyperparathyroidism of renal origin: Secondary | ICD-10-CM | POA: Diagnosis not present

## 2021-09-02 DIAGNOSIS — D631 Anemia in chronic kidney disease: Secondary | ICD-10-CM | POA: Diagnosis not present

## 2021-09-02 DIAGNOSIS — N185 Chronic kidney disease, stage 5: Secondary | ICD-10-CM | POA: Diagnosis not present

## 2021-09-02 DIAGNOSIS — T8249XA Other complication of vascular dialysis catheter, initial encounter: Secondary | ICD-10-CM | POA: Diagnosis not present

## 2021-09-02 DIAGNOSIS — D688 Other specified coagulation defects: Secondary | ICD-10-CM | POA: Diagnosis not present

## 2021-09-04 DIAGNOSIS — D631 Anemia in chronic kidney disease: Secondary | ICD-10-CM | POA: Diagnosis not present

## 2021-09-04 DIAGNOSIS — N186 End stage renal disease: Secondary | ICD-10-CM | POA: Diagnosis not present

## 2021-09-04 DIAGNOSIS — N185 Chronic kidney disease, stage 5: Secondary | ICD-10-CM | POA: Diagnosis not present

## 2021-09-04 DIAGNOSIS — Z992 Dependence on renal dialysis: Secondary | ICD-10-CM | POA: Diagnosis not present

## 2021-09-04 DIAGNOSIS — T8249XA Other complication of vascular dialysis catheter, initial encounter: Secondary | ICD-10-CM | POA: Diagnosis not present

## 2021-09-04 DIAGNOSIS — D688 Other specified coagulation defects: Secondary | ICD-10-CM | POA: Diagnosis not present

## 2021-09-04 DIAGNOSIS — N2581 Secondary hyperparathyroidism of renal origin: Secondary | ICD-10-CM | POA: Diagnosis not present

## 2021-09-07 DIAGNOSIS — N185 Chronic kidney disease, stage 5: Secondary | ICD-10-CM | POA: Diagnosis not present

## 2021-09-07 DIAGNOSIS — N2581 Secondary hyperparathyroidism of renal origin: Secondary | ICD-10-CM | POA: Diagnosis not present

## 2021-09-07 DIAGNOSIS — Z992 Dependence on renal dialysis: Secondary | ICD-10-CM | POA: Diagnosis not present

## 2021-09-07 DIAGNOSIS — D631 Anemia in chronic kidney disease: Secondary | ICD-10-CM | POA: Diagnosis not present

## 2021-09-07 DIAGNOSIS — D688 Other specified coagulation defects: Secondary | ICD-10-CM | POA: Diagnosis not present

## 2021-09-07 DIAGNOSIS — N186 End stage renal disease: Secondary | ICD-10-CM | POA: Diagnosis not present

## 2021-09-07 DIAGNOSIS — T8249XA Other complication of vascular dialysis catheter, initial encounter: Secondary | ICD-10-CM | POA: Diagnosis not present

## 2021-09-09 DIAGNOSIS — N2581 Secondary hyperparathyroidism of renal origin: Secondary | ICD-10-CM | POA: Diagnosis not present

## 2021-09-09 DIAGNOSIS — N185 Chronic kidney disease, stage 5: Secondary | ICD-10-CM | POA: Diagnosis not present

## 2021-09-09 DIAGNOSIS — Z992 Dependence on renal dialysis: Secondary | ICD-10-CM | POA: Diagnosis not present

## 2021-09-09 DIAGNOSIS — D631 Anemia in chronic kidney disease: Secondary | ICD-10-CM | POA: Diagnosis not present

## 2021-09-09 DIAGNOSIS — N186 End stage renal disease: Secondary | ICD-10-CM | POA: Diagnosis not present

## 2021-09-09 DIAGNOSIS — T8249XA Other complication of vascular dialysis catheter, initial encounter: Secondary | ICD-10-CM | POA: Diagnosis not present

## 2021-09-09 DIAGNOSIS — D688 Other specified coagulation defects: Secondary | ICD-10-CM | POA: Diagnosis not present

## 2021-09-11 ENCOUNTER — Emergency Department (HOSPITAL_COMMUNITY): Payer: Medicare Other

## 2021-09-11 ENCOUNTER — Emergency Department (HOSPITAL_COMMUNITY)
Admission: EM | Admit: 2021-09-11 | Discharge: 2021-09-11 | Disposition: A | Discharge status: Home / Self Care | Payer: Medicare Other | Attending: Emergency Medicine | Admitting: Emergency Medicine

## 2021-09-11 DIAGNOSIS — S0990XA Unspecified injury of head, initial encounter: Secondary | ICD-10-CM

## 2021-09-11 DIAGNOSIS — N185 Chronic kidney disease, stage 5: Secondary | ICD-10-CM | POA: Diagnosis not present

## 2021-09-11 DIAGNOSIS — N186 End stage renal disease: Secondary | ICD-10-CM | POA: Diagnosis not present

## 2021-09-11 DIAGNOSIS — W19XXXA Unspecified fall, initial encounter: Secondary | ICD-10-CM

## 2021-09-11 DIAGNOSIS — Z992 Dependence on renal dialysis: Secondary | ICD-10-CM | POA: Insufficient documentation

## 2021-09-11 DIAGNOSIS — J3489 Other specified disorders of nose and nasal sinuses: Secondary | ICD-10-CM | POA: Diagnosis not present

## 2021-09-11 DIAGNOSIS — I130 Hypertensive heart and chronic kidney disease with heart failure and stage 1 through stage 4 chronic kidney disease, or unspecified chronic kidney disease: Secondary | ICD-10-CM | POA: Insufficient documentation

## 2021-09-11 DIAGNOSIS — N184 Chronic kidney disease, stage 4 (severe): Secondary | ICD-10-CM | POA: Diagnosis not present

## 2021-09-11 DIAGNOSIS — S199XXA Unspecified injury of neck, initial encounter: Secondary | ICD-10-CM | POA: Diagnosis not present

## 2021-09-11 DIAGNOSIS — T8249XA Other complication of vascular dialysis catheter, initial encounter: Secondary | ICD-10-CM | POA: Diagnosis not present

## 2021-09-11 DIAGNOSIS — W01198A Fall on same level from slipping, tripping and stumbling with subsequent striking against other object, initial encounter: Secondary | ICD-10-CM | POA: Diagnosis not present

## 2021-09-11 DIAGNOSIS — I509 Heart failure, unspecified: Secondary | ICD-10-CM | POA: Diagnosis not present

## 2021-09-11 DIAGNOSIS — Z79899 Other long term (current) drug therapy: Secondary | ICD-10-CM | POA: Diagnosis not present

## 2021-09-11 DIAGNOSIS — Z23 Encounter for immunization: Secondary | ICD-10-CM | POA: Diagnosis not present

## 2021-09-11 DIAGNOSIS — N2581 Secondary hyperparathyroidism of renal origin: Secondary | ICD-10-CM | POA: Diagnosis not present

## 2021-09-11 DIAGNOSIS — S0101XA Laceration without foreign body of scalp, initial encounter: Secondary | ICD-10-CM | POA: Diagnosis not present

## 2021-09-11 DIAGNOSIS — D631 Anemia in chronic kidney disease: Secondary | ICD-10-CM | POA: Diagnosis not present

## 2021-09-11 DIAGNOSIS — M2578 Osteophyte, vertebrae: Secondary | ICD-10-CM | POA: Diagnosis not present

## 2021-09-11 DIAGNOSIS — D688 Other specified coagulation defects: Secondary | ICD-10-CM | POA: Diagnosis not present

## 2021-09-11 DIAGNOSIS — M47812 Spondylosis without myelopathy or radiculopathy, cervical region: Secondary | ICD-10-CM | POA: Diagnosis not present

## 2021-09-11 LAB — CBG MONITORING, ED: Glucose-Capillary: 101 mg/dL — ABNORMAL HIGH (ref 70–99)

## 2021-09-11 MED ORDER — TETANUS-DIPHTH-ACELL PERTUSSIS 5-2.5-18.5 LF-MCG/0.5 IM SUSY
0.5000 mL | PREFILLED_SYRINGE | Freq: Once | INTRAMUSCULAR | Status: AC
Start: 2021-09-11 — End: 2021-09-11
  Administered 2021-09-11: 0.5 mL via INTRAMUSCULAR
  Filled 2021-09-11: qty 0.5

## 2021-09-11 MED ORDER — LIDOCAINE-EPINEPHRINE (PF) 2 %-1:200000 IJ SOLN
20.0000 mL | Freq: Once | INTRAMUSCULAR | Status: AC
  Administered 2021-09-11: 20 mL via INTRADERMAL
  Filled 2021-09-11: qty 20

## 2021-09-11 NOTE — ED Notes (Signed)
EDP at bedside for lac repair.

## 2021-09-11 NOTE — Discharge Instructions (Signed)
You were seen in the emergency room after you fell and hit your head which resulted in a laceration on the back left side of your head.  We did a CT scan of your head and neck which showed no fractures of the bones of your neck or head and no signs of bleeding in your brain at this time.  We repaired the laceration on her scalp with 2 deep tissue stitches and 7 staples on your head.  Please have the stitches removed in about 7 days with your PCP.  Keep the wound clean and dry in the interim and avoid submerging your head in water until you have clearance from your primary care physician.  If you experience any worsening pain, discharge, bleeding, redness, or warmth around the incision as well as any new or worsening dizziness or confusion please return to the emergency department.

## 2021-09-11 NOTE — ED Notes (Signed)
Pt verbalized understanding of d/c instructions, meds, and followup care. Denies questions. VSS, no distress noted. Steady gait to exit with all belongings.  ?
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
## 2021-09-11 NOTE — ED Provider Notes (Cosign Needed)
[facility] EMERGENCY DEPARTMENT Provider Note   CSN: [id number] Arrival date & time: 09/11/21  2045     History  Chief Complaint  Patient presents with   Fall    Head lac    Johnny [name] is a 61 y.o. male.  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  He was stepping out of his car when he tripped and fell and hit the back of his head on another car.  He admits to some dizziness after hitting his head but denies any loss of consciousness.  He denies any preceding or resulting syncope, presyncope, chest pain, shortness of breath, heart palpitations, weakness, nausea, vomiting, blurry vision, or other focal neurologic deficits.  He is on a blood thinner and he had dialysis today which she states had no abnormalities and he did not feel overly fatigued after this.  He does not remember when he had his last tetanus vaccine.   Fall Pertinent negatives include no chest pain, no abdominal pain and no shortness of breath.       Home Medications Prior to Admission medications   Medication Sig Start Date End Date Taking? Authorizing Provider  amLODipine (NORVASC) 5 MG tablet Take 1 tablet (5 mg total) by mouth daily. 06/04/21   Bonnielee Haff, MD  ascorbic acid (VITAMIN C) 500 MG tablet Take 1 tablet (500 mg total) by mouth daily. 03/13/19   Raiford Noble Latif, DO  calcitRIOL (ROCALTROL) 0.5 MCG capsule Take 2 capsules (1 mcg total) by mouth daily. 06/05/21   Bonnielee Haff, MD  carvedilol (COREG) 12.5 MG tablet Take 1 tablet (12.5 mg total) by mouth 2 (two) times daily with a meal. Patient not taking: Reported on 09/01/2021 06/04/21   Bonnielee Haff, MD  cholecalciferol (VITAMIN D3) 25 MCG (1000 UNIT) tablet Take 1,000 Units by mouth daily.    [provider]  zinc sulfate 220 (50 Zn) MG capsule  Take 1 capsule (220 mg total) by mouth daily. 03/13/19   Raiford Noble Latif, DO      Allergies    Lisinopril    Review of Systems   Review of Systems  Constitutional:  Negative for activity change, diaphoresis and fever.  Eyes:  Negative for visual disturbance.  Respiratory:  Negative for cough, chest tightness and shortness of breath.   Cardiovascular:  Positive for leg swelling. Negative for chest pain and palpitations.  Gastrointestinal:  Negative for abdominal pain and nausea.  Musculoskeletal:  Negative for neck pain and neck stiffness.  Neurological:  Positive for dizziness. Negative for syncope, facial asymmetry, weakness, light-headedness and numbness.  Psychiatric/Behavioral:  Negative for confusion.     Physical Exam Updated Vital Signs BP 115/69   Pulse 87   Temp 98 F (36.7 C)   Resp 20   Ht '5\' 9"'$  (1.753 m)   Wt 127 kg   SpO2 100%   BMI 41.35 kg/m  Physical Exam Vitals reviewed.  Constitutional:      General: He is not in acute distress. HENT:     Head: Normocephalic.     Comments: There is a 15 cm crescent shaped laceration on the left posterior parietotemporal region with no active bleeding.  In the incision there is visible school through a galeal laceration without gross evidence of skull fracture. Cardiovascular:     Rate and  Rhythm: Normal rate and regular rhythm.  Pulmonary:     Effort: Pulmonary effort is normal.     Breath sounds: Normal breath sounds.  Musculoskeletal:     Right lower leg: Edema present.     Left lower leg: Edema present.     Comments: Bilateral nonpitting lower extremity edema.  Neurological:     Mental Status: He is alert.     ED Results / Procedures / Treatments   Labs (all labs ordered are listed, but only abnormal results are displayed) Labs Reviewed  CBG MONITORING, ED - Abnormal; Notable for the following components:      Result Value   Glucose-Capillary 101 (*)    All other components within normal limits     EKG None  Radiology CT Cervical Spine Wo Contrast  Result Date: 09/11/2021 CLINICAL DATA:  Head trauma.  Trip and fall injury. EXAM: CT CERVICAL SPINE WITHOUT CONTRAST TECHNIQUE: Multidetector CT imaging of the cervical spine was performed without intravenous contrast. Multiplanar CT image reconstructions were also generated. RADIATION DOSE REDUCTION: This exam was performed according to the departmental dose-optimization program which includes automated exposure control, adjustment of the mA and/or kV according to patient size and/or use of iterative reconstruction technique. COMPARISON:  None Available. FINDINGS: Alignment: Normal alignment of the cervical vertebrae and facet joints. C1-2 articulation appears intact. Skull base and vertebrae: No acute fracture. No primary bone lesion or focal pathologic process. Soft tissues and spinal canal: No prevertebral fluid or swelling. No visible canal hematoma. Disc levels: Mild disc space narrowing with endplate osteophyte formation at C5-6, C6-7 and C7-T1 levels. Degenerative changes at C1-2. Upper chest: Lung apices are clear. Other: None. IMPRESSION: Normal alignment of the cervical spine. No acute displaced fractures identified. Mild degenerative changes. Electronically Signed   By: Lucienne Capers M.D.   On: 09/11/2021 21:37   CT Head Wo Contrast  Result Date: 09/11/2021 CLINICAL DATA:  Head trauma, moderate to severe. Trip and fall injury. Laceration. EXAM: CT HEAD WITHOUT CONTRAST TECHNIQUE: Contiguous axial images were obtained from the base of the skull through the vertex without intravenous contrast. RADIATION DOSE REDUCTION: This exam was performed according to the departmental dose-optimization program which includes automated exposure control, adjustment of the mA and/or kV according to patient size and/or use of iterative reconstruction technique. COMPARISON:  None Available. FINDINGS: Brain: No evidence of acute infarction, hemorrhage,  hydrocephalus, extra-axial collection or mass lesion/mass effect. Vascular: Vascular calcifications are present. Skull: Calvarium appears intact. Subcutaneous scalp laceration over the left posterior occipital region. Sinuses/Orbits: Mild mucosal thickening in the paranasal sinuses. No acute air-fluid levels. Mastoid air cells are clear. Old fracture deformity of the medial right orbital wall. Other: None. IMPRESSION: No acute intracranial abnormalities. Electronically Signed   By: Lucienne Capers M.D.   On: 09/11/2021 21:35    Procedures Procedures    Medications Ordered in ED Medications  lidocaine-EPINEPHrine (XYLOCAINE W/EPI) 2 %-1:200000 (PF) injection 20 mL (has no administration in time range)  Tdap (BOOSTRIX) injection 0.5 mL (0.5 mLs Intramuscular Given 09/11/21 2139)    ED Course/ Medical Decision Making/ A&P                           Medical Decision Making Johnny [name] is a 61 year old male with past medical history of chronic kidney disease stage IV on dialysis, hypertension, congestive heart failure, and acquired lymphedema who presents with a head laceration after a mechanical fall without loss of consciousness.  There is no evidence of a cardiac or neurologic cause of his fall.  CT of the head and neck without contrast obtained to evaluate for acute fractures or intracranial processes.  CT of the head and neck without contrast showed no acute intracranial, cranial, or cervical bony abnormalities.  Laceration was repaired using sterile technique and lidocaine with epinephrine as local anesthetic and included 2 simple interrupted stitches to repair the galeal laceration and then 7 skin staples were placed to repair the superficial laceration.  Patient instructed to have these staples removed in about 7 days which she can do with his primary care physician.  Patient counseled on proper wound hygiene.  Patient discharged in stable condition and all questions were answered prior to  discharge.  Problems Addressed: Fall, initial encounter: acute illness or injury Injury of head, initial encounter: acute illness or injury Laceration of scalp, initial encounter: acute illness or injury  Amount and/or Complexity of Data Reviewed Labs: ordered. Decision-making details documented in ED Course. Radiology: ordered and independent interpretation performed. Decision-making details documented in ED Course.  Risk Prescription drug management.           Final Clinical Impression(s) / ED Diagnoses Final diagnoses:  Fall, initial encounter  Injury of head, initial encounter  Laceration of scalp, initial encounter    Rx / DC Orders ED Discharge Orders     None         Johny Blamer, DO 09/11/21 2256

## 2021-09-11 NOTE — ED Notes (Addendum)
Wrapped pt's head with non adherent pad and surgical tape.

## 2021-09-14 DIAGNOSIS — D688 Other specified coagulation defects: Secondary | ICD-10-CM | POA: Diagnosis not present

## 2021-09-14 DIAGNOSIS — N185 Chronic kidney disease, stage 5: Secondary | ICD-10-CM | POA: Diagnosis not present

## 2021-09-14 DIAGNOSIS — N186 End stage renal disease: Secondary | ICD-10-CM | POA: Diagnosis not present

## 2021-09-14 DIAGNOSIS — T8249XA Other complication of vascular dialysis catheter, initial encounter: Secondary | ICD-10-CM | POA: Diagnosis not present

## 2021-09-14 DIAGNOSIS — Z992 Dependence on renal dialysis: Secondary | ICD-10-CM | POA: Diagnosis not present

## 2021-09-14 DIAGNOSIS — N2581 Secondary hyperparathyroidism of renal origin: Secondary | ICD-10-CM | POA: Diagnosis not present

## 2021-09-14 DIAGNOSIS — D631 Anemia in chronic kidney disease: Secondary | ICD-10-CM | POA: Diagnosis not present

## 2021-09-16 DIAGNOSIS — N185 Chronic kidney disease, stage 5: Secondary | ICD-10-CM | POA: Diagnosis not present

## 2021-09-16 DIAGNOSIS — N2581 Secondary hyperparathyroidism of renal origin: Secondary | ICD-10-CM | POA: Diagnosis not present

## 2021-09-16 DIAGNOSIS — T8249XA Other complication of vascular dialysis catheter, initial encounter: Secondary | ICD-10-CM | POA: Diagnosis not present

## 2021-09-16 DIAGNOSIS — D631 Anemia in chronic kidney disease: Secondary | ICD-10-CM | POA: Diagnosis not present

## 2021-09-16 DIAGNOSIS — D688 Other specified coagulation defects: Secondary | ICD-10-CM | POA: Diagnosis not present

## 2021-09-16 DIAGNOSIS — Z992 Dependence on renal dialysis: Secondary | ICD-10-CM | POA: Diagnosis not present

## 2021-09-16 DIAGNOSIS — N186 End stage renal disease: Secondary | ICD-10-CM | POA: Diagnosis not present

## 2021-09-18 DIAGNOSIS — N185 Chronic kidney disease, stage 5: Secondary | ICD-10-CM | POA: Diagnosis not present

## 2021-09-18 DIAGNOSIS — Z992 Dependence on renal dialysis: Secondary | ICD-10-CM | POA: Diagnosis not present

## 2021-09-18 DIAGNOSIS — N2581 Secondary hyperparathyroidism of renal origin: Secondary | ICD-10-CM | POA: Diagnosis not present

## 2021-09-18 DIAGNOSIS — T8249XA Other complication of vascular dialysis catheter, initial encounter: Secondary | ICD-10-CM | POA: Diagnosis not present

## 2021-09-18 DIAGNOSIS — D688 Other specified coagulation defects: Secondary | ICD-10-CM | POA: Diagnosis not present

## 2021-09-18 DIAGNOSIS — N186 End stage renal disease: Secondary | ICD-10-CM | POA: Diagnosis not present

## 2021-09-18 DIAGNOSIS — D631 Anemia in chronic kidney disease: Secondary | ICD-10-CM | POA: Diagnosis not present

## 2021-09-21 DIAGNOSIS — N185 Chronic kidney disease, stage 5: Secondary | ICD-10-CM | POA: Diagnosis not present

## 2021-09-21 DIAGNOSIS — I120 Hypertensive chronic kidney disease with stage 5 chronic kidney disease or end stage renal disease: Secondary | ICD-10-CM | POA: Diagnosis not present

## 2021-09-21 DIAGNOSIS — Z992 Dependence on renal dialysis: Secondary | ICD-10-CM | POA: Diagnosis not present

## 2021-09-21 DIAGNOSIS — N186 End stage renal disease: Secondary | ICD-10-CM | POA: Diagnosis not present

## 2021-09-21 DIAGNOSIS — N2581 Secondary hyperparathyroidism of renal origin: Secondary | ICD-10-CM | POA: Diagnosis not present

## 2021-09-21 DIAGNOSIS — D688 Other specified coagulation defects: Secondary | ICD-10-CM | POA: Diagnosis not present

## 2021-09-21 DIAGNOSIS — T8249XA Other complication of vascular dialysis catheter, initial encounter: Secondary | ICD-10-CM | POA: Diagnosis not present

## 2021-09-22 ENCOUNTER — Ambulatory Visit: Payer: Medicare HMO | Admitting: Family Medicine

## 2021-09-22 VITALS — BP 111/77 | HR 86 | Temp 98.4°F | Resp 18 | Ht 69.0 in | Wt 292.0 lb

## 2021-09-22 DIAGNOSIS — S0101XS Laceration without foreign body of scalp, sequela: Secondary | ICD-10-CM

## 2021-09-22 NOTE — Patient Instructions (Addendum)
It was great seeing you today!  Today we discussed your scalp, the area looks very good. I have removed all the staples. It does not look infected, you may use vaseline on the area. I would avoid hair cuts until the area heals more. If you notice any bleeding, drainage or pus or you start to get a fever then please contact our clinic to be seen again.   Please follow up at your next scheduled appointment, if anything arises between now and then, please don't hesitate to contact our office.   Thank you for allowing [place] to be a part of your medical care!  Thank you, Dr. Larae Grooms

## 2021-09-22 NOTE — Progress Notes (Signed)
    SUBJECTIVE:   CHIEF COMPLAINT / HPI:   Patient presents for ED follow up after getting stitches and staples placed in his head by the ED provider on 7/21. He fell and hit his head when he was playing with his great nephews when he slipped. Denies loss of consciousness, headache, vision changes or neurological deficits. There was a lot of bleeding while at the ED and since then has not been any blood. Denies any drainage, fever, chills or other symptoms. He has been keeping the area covered so that it is not exposed and has been doing well.   OBJECTIVE:   BP 111/77   Pulse 86   Temp 98.4 F (36.9 C) (Oral)   Resp 18   Ht '5\' 9"'$  (1.753 m)   Wt 292 lb (132.5 kg)   SpO2 99%   BMI 43.12 kg/m   General: Patient well-appearing, in no acute distress. HEENT: scalp laceration with 7 staples noted (see picture below) without drainage or bleeding CV: RRR, no murmurs or gallops auscultated Resp: CTAB, no wheezing, rales or rhonchi noted     ASSESSMENT/PLAN:   Laceration of scalp -7 staples removed without complications and patient tolerated well, overall healing appropriately without signs of infection -wound care instructions discussed including keeping area covered and applying vaseline -return precautions discussed including if patient notices bleeding or drainage with pus    -PHQ-9 score of 0 reviewed.    Donney Dice, [place]

## 2021-09-23 DIAGNOSIS — D688 Other specified coagulation defects: Secondary | ICD-10-CM | POA: Diagnosis not present

## 2021-09-23 DIAGNOSIS — D631 Anemia in chronic kidney disease: Secondary | ICD-10-CM | POA: Diagnosis not present

## 2021-09-23 DIAGNOSIS — N185 Chronic kidney disease, stage 5: Secondary | ICD-10-CM | POA: Diagnosis not present

## 2021-09-23 DIAGNOSIS — T8249XA Other complication of vascular dialysis catheter, initial encounter: Secondary | ICD-10-CM | POA: Diagnosis not present

## 2021-09-23 DIAGNOSIS — N2581 Secondary hyperparathyroidism of renal origin: Secondary | ICD-10-CM | POA: Diagnosis not present

## 2021-09-23 DIAGNOSIS — Z992 Dependence on renal dialysis: Secondary | ICD-10-CM | POA: Diagnosis not present

## 2021-09-23 DIAGNOSIS — N186 End stage renal disease: Secondary | ICD-10-CM | POA: Diagnosis not present

## 2021-09-25 DIAGNOSIS — N185 Chronic kidney disease, stage 5: Secondary | ICD-10-CM | POA: Diagnosis not present

## 2021-09-25 DIAGNOSIS — D631 Anemia in chronic kidney disease: Secondary | ICD-10-CM | POA: Diagnosis not present

## 2021-09-25 DIAGNOSIS — Z992 Dependence on renal dialysis: Secondary | ICD-10-CM | POA: Diagnosis not present

## 2021-09-25 DIAGNOSIS — N2581 Secondary hyperparathyroidism of renal origin: Secondary | ICD-10-CM | POA: Diagnosis not present

## 2021-09-25 DIAGNOSIS — D688 Other specified coagulation defects: Secondary | ICD-10-CM | POA: Diagnosis not present

## 2021-09-25 DIAGNOSIS — T8249XA Other complication of vascular dialysis catheter, initial encounter: Secondary | ICD-10-CM | POA: Diagnosis not present

## 2021-09-25 DIAGNOSIS — N186 End stage renal disease: Secondary | ICD-10-CM | POA: Diagnosis not present

## 2021-09-28 DIAGNOSIS — N186 End stage renal disease: Secondary | ICD-10-CM | POA: Diagnosis not present

## 2021-09-28 DIAGNOSIS — Z992 Dependence on renal dialysis: Secondary | ICD-10-CM | POA: Diagnosis not present

## 2021-09-28 DIAGNOSIS — N2581 Secondary hyperparathyroidism of renal origin: Secondary | ICD-10-CM | POA: Diagnosis not present

## 2021-09-28 DIAGNOSIS — N185 Chronic kidney disease, stage 5: Secondary | ICD-10-CM | POA: Diagnosis not present

## 2021-09-28 DIAGNOSIS — D631 Anemia in chronic kidney disease: Secondary | ICD-10-CM | POA: Diagnosis not present

## 2021-09-28 DIAGNOSIS — D688 Other specified coagulation defects: Secondary | ICD-10-CM | POA: Diagnosis not present

## 2021-09-28 DIAGNOSIS — T8249XA Other complication of vascular dialysis catheter, initial encounter: Secondary | ICD-10-CM | POA: Diagnosis not present

## 2021-09-29 NOTE — Progress Notes (Signed)
POST OPERATIVE OFFICE NOTE    CC:  F/u for surgery  HPI:  This is a 61 y.o. male who is s/p left RC AVF on 06/03/2021 by Dr. Carlis Abbott.  He has hx of right IJ TDC placed on 05/30/2021 by IR.   Pt was seen on 09/01/2021 and at that time, his AVF had an excellent thrill.  The fistula was slow to mature and had multiple side branches.  Dr. Carlis Abbott offered his surgery to ligate these branches, but at the time pt was not interested.  He was instructed to [place] an exercise ball daily for the next month and come back for repeat duplex in 4-5 weeks.  He returns today for that visit.    Pt states he does not have pain/numbness in the left hand.  He states he has been working with the squeeze ball.    The pt is on dialysis M/W/F at [place] [facility].   Allergies  Allergen Reactions   Lisinopril Swelling and Other (See Comments)    Angioedema    Current Outpatient Medications  Medication Sig Dispense Refill   amLODipine (NORVASC) 5 MG tablet Take 1 tablet (5 mg total) by mouth daily. 30 tablet 0   ascorbic acid (VITAMIN C) 500 MG tablet Take 1 tablet (500 mg total) by mouth daily. 30 tablet 0   calcitRIOL (ROCALTROL) 0.5 MCG capsule Take 2 capsules (1 mcg total) by mouth daily. 30 capsule 0   carvedilol (COREG) 12.5 MG tablet Take 1 tablet (12.5 mg total) by mouth 2 (two) times daily with a meal. (Patient not taking: Reported on 09/01/2021) 60 tablet 0   cholecalciferol (VITAMIN D3) 25 MCG (1000 UNIT) tablet Take 1,000 Units by mouth daily.     zinc sulfate 220 (50 Zn) MG capsule Take 1 capsule (220 mg total) by mouth daily. 14 capsule 0   No current facility-administered medications for this visit.     ROS:  See HPI and unchanged from previous visit  Physical Exam:  Today's Vitals   10/06/21 0756  BP: 115/76  Pulse: 78  Resp: 20  Temp: 98.2 F (36.8 C)  TempSrc: Temporal  SpO2: 97%  Weight: 291 lb 1.6 oz (132 kg)  Height: '5\' 9"'$  (1.753 m)   Body mass index is 42.99 kg/m.  General:  no  distress Lungs: non labored Cardiac:  regular Neuro: intact Incision:  healed nicely Vascular: There is a palpable left radial pulse.   Extremities:   Motor and sensory are in tact.   There is a thrill present.  The fistula is easily palpable   Dialysis Duplex on 10/06/2021: Findings:  +--------------------+----------+-----------------+--------+  AVF                 PSV (cm/s)Flow Vol (mL/min)Comments  +--------------------+----------+-----------------+--------+  Native artery inflow   198           660                 +--------------------+----------+-----------------+--------+  AVF Anastomosis        509                               +--------------------+----------+-----------------+--------+      +------------+----------+-------------+----------+--------+  OUTFLOW VEINPSV (cm/s)Diameter (cm)Depth (cm)Describe  +------------+----------+-------------+----------+--------+  Dist UA         56        0.54        0.36             +------------+----------+-------------+----------+--------+  AC Fossa        92        0.48        0.22             +------------+----------+-------------+----------+--------+  Mid Forearm    341        0.53        0.20             +------------+----------+-------------+----------+--------+  Dist Forearm   479        0.39        0.14             +------------+----------+-------------+----------+--------+   Assessment/Plan:  This is a 61 y.o. male who is s/p: left RC AVF on 06/03/2021 by Dr. Carlis Abbott.  He has hx of right IJ TDC placed on 05/30/2021 by IR.   -the pt does not have evidence of steal. -the fistula has matured slightly from last visit, but still not big enough to access.  Dr. Carlis Abbott had discussed with him at last visit about branch ligation but he wanted to try conservative measures.  Discussed with him about branch ligation and he understands this gives the fistula a chance to mature, but may not and may  need more procedures or even new access.  Will schedule this with Dr. Carlis Abbott on pt's non dialysis day. -discussed with him that we need to get permanent access before his [facility] can be removed.  -discussed with pt that access does not last forever and will need intervention or even new access at some point.  -he is not on blood thinners   [name], [facility] [phone]  Clinic MD:  Carlis Abbott

## 2021-09-30 DIAGNOSIS — N186 End stage renal disease: Secondary | ICD-10-CM | POA: Diagnosis not present

## 2021-09-30 DIAGNOSIS — T8249XA Other complication of vascular dialysis catheter, initial encounter: Secondary | ICD-10-CM | POA: Diagnosis not present

## 2021-09-30 DIAGNOSIS — D688 Other specified coagulation defects: Secondary | ICD-10-CM | POA: Diagnosis not present

## 2021-09-30 DIAGNOSIS — D631 Anemia in chronic kidney disease: Secondary | ICD-10-CM | POA: Diagnosis not present

## 2021-09-30 DIAGNOSIS — N2581 Secondary hyperparathyroidism of renal origin: Secondary | ICD-10-CM | POA: Diagnosis not present

## 2021-09-30 DIAGNOSIS — Z992 Dependence on renal dialysis: Secondary | ICD-10-CM | POA: Diagnosis not present

## 2021-09-30 DIAGNOSIS — N185 Chronic kidney disease, stage 5: Secondary | ICD-10-CM | POA: Diagnosis not present

## 2021-10-02 DIAGNOSIS — D688 Other specified coagulation defects: Secondary | ICD-10-CM | POA: Diagnosis not present

## 2021-10-02 DIAGNOSIS — N2581 Secondary hyperparathyroidism of renal origin: Secondary | ICD-10-CM | POA: Diagnosis not present

## 2021-10-02 DIAGNOSIS — Z992 Dependence on renal dialysis: Secondary | ICD-10-CM | POA: Diagnosis not present

## 2021-10-02 DIAGNOSIS — N186 End stage renal disease: Secondary | ICD-10-CM | POA: Diagnosis not present

## 2021-10-02 DIAGNOSIS — N185 Chronic kidney disease, stage 5: Secondary | ICD-10-CM | POA: Diagnosis not present

## 2021-10-02 DIAGNOSIS — D631 Anemia in chronic kidney disease: Secondary | ICD-10-CM | POA: Diagnosis not present

## 2021-10-02 DIAGNOSIS — T8249XA Other complication of vascular dialysis catheter, initial encounter: Secondary | ICD-10-CM | POA: Diagnosis not present

## 2021-10-06 ENCOUNTER — Ambulatory Visit (HOSPITAL_COMMUNITY)
Admission: RE | Admit: 2021-10-06 | Discharge: 2021-10-06 | Disposition: A | Discharge status: Home / Self Care | Payer: Medicare Other | Source: Ambulatory Visit | Attending: Surgery | Admitting: Surgery

## 2021-10-06 ENCOUNTER — Other Ambulatory Visit: Payer: Self-pay

## 2021-10-06 ENCOUNTER — Encounter: Payer: Self-pay | Admitting: Physician Assistant

## 2021-10-06 ENCOUNTER — Ambulatory Visit: Payer: Medicaid Other | Admitting: Physician Assistant

## 2021-10-06 VITALS — BP 115/76 | HR 78 | Temp 98.2°F | Resp 20 | Ht 69.0 in | Wt 291.1 lb

## 2021-10-06 DIAGNOSIS — N186 End stage renal disease: Secondary | ICD-10-CM | POA: Diagnosis not present

## 2021-10-09 ENCOUNTER — Encounter (HOSPITAL_COMMUNITY): Payer: Self-pay | Admitting: Vascular Surgery

## 2021-10-09 ENCOUNTER — Other Ambulatory Visit: Payer: Self-pay

## 2021-10-09 NOTE — Progress Notes (Signed)
Chest x-ray - n/a EKG - 08/27/21 Stress Test - denies ECHO - 05/30/21 Cardiac Cath - denies  CPAP - OSA - not wearing CPAP  Fasting Blood Sugar - n/a  Blood Thinner Instructions: n/a Aspirin Instructions: Patient was instructed: As of today, STOP taking any Aspirin (unless otherwise instructed by your surgeon) Aleve, Naproxen, Ibuprofen, Motrin, Advil, Goody's, BC's, all herbal medications, fish oil, and all vitamins.  ERAS Protcol - n/a  COVID TEST- n/a  Anesthesia review: yes - history of CHF  Patient verbally denies any shortness of breath, fever, cough and chest pain during phone call   -------------  SDW INSTRUCTIONS given:  Your procedure is scheduled on Monday, August 21st, 2023.  Report to [facility] Main Entrance "A" at 12:30,  and check in at the Admitting office.  Call this number if you have problems the morning of surgery:  [phone]   Remember:  Do not eat or drink after midnight the night before your surgery    Take these medicines the morning of surgery with A SIP OF WATER Coreg   The day of surgery:                   Do not wear jewelry            Do not wear lotions, powders, colognes, or deodorant.            Men may shave face and neck.            Do not bring valuables to the hospital.            [facility] is not responsible for any belongings or valuables.  Do NOT Smoke (Tobacco/Vaping) 24 hours prior to your procedure If you use a CPAP at night, you may bring all equipment for your overnight stay.   Contacts, glasses, dentures or bridgework may not be worn into surgery.      For patients admitted to the hospital, discharge time will be determined by your treatment team.   Patients discharged the day of surgery will not be allowed to drive home, and someone needs to stay with them for 24 hours.    Special instructions:   [facility]- Preparing For Surgery  Before surgery, you can play an important role. Because skin is not sterile,  your skin needs to be as free of germs as possible. You can reduce the number of germs on your skin by washing with CHG (chlorahexidine gluconate) Soap before surgery.  CHG is an antiseptic cleaner which kills germs and bonds with the skin to continue killing germs even after washing.    Oral Hygiene is also important to reduce your risk of infection.  Remember - BRUSH YOUR TEETH THE MORNING OF SURGERY WITH YOUR REGULAR TOOTHPASTE  Please do not use if you have an allergy to CHG or antibacterial soaps. If your skin becomes reddened/irritated stop using the CHG.  Do not shave (including legs and underarms) for at least 48 hours prior to first CHG shower. It is OK to shave your face.  Please follow these instructions carefully.   Shower the NIGHT BEFORE SURGERY and the MORNING OF SURGERY with DIAL Soap.   Pat yourself dry with a CLEAN TOWEL.  Wear CLEAN PAJAMAS to bed the night before surgery  Place CLEAN SHEETS on your bed the night of your first shower and DO NOT SLEEP WITH PETS.   Day of Surgery: Please shower morning of surgery  Wear Clean/Comfortable clothing  the morning of surgery Do not apply any deodorants/lotions.   Remember to brush your teeth WITH YOUR REGULAR TOOTHPASTE.   Questions were answered. Patient verbalized understanding of instructions.

## 2021-10-12 ENCOUNTER — Ambulatory Visit (HOSPITAL_BASED_OUTPATIENT_CLINIC_OR_DEPARTMENT_OTHER): Payer: Medicare Other | Admitting: Physician Assistant

## 2021-10-12 ENCOUNTER — Encounter (HOSPITAL_COMMUNITY)
Admission: RE | Disposition: A | Discharge status: Home / Self Care | Payer: Self-pay | Source: Home / Self Care | Attending: Vascular Surgery

## 2021-10-12 ENCOUNTER — Encounter (HOSPITAL_COMMUNITY): Payer: Self-pay | Admitting: Vascular Surgery

## 2021-10-12 ENCOUNTER — Ambulatory Visit (HOSPITAL_COMMUNITY): Payer: Medicare Other | Admitting: Physician Assistant

## 2021-10-12 ENCOUNTER — Other Ambulatory Visit: Payer: Self-pay

## 2021-10-12 ENCOUNTER — Ambulatory Visit (HOSPITAL_COMMUNITY)
Admission: RE | Admit: 2021-10-12 | Discharge: 2021-10-12 | Disposition: A | Discharge status: Home / Self Care | Payer: Medicare Other | Attending: Vascular Surgery | Admitting: Vascular Surgery

## 2021-10-12 DIAGNOSIS — I509 Heart failure, unspecified: Secondary | ICD-10-CM | POA: Diagnosis not present

## 2021-10-12 DIAGNOSIS — D631 Anemia in chronic kidney disease: Secondary | ICD-10-CM

## 2021-10-12 DIAGNOSIS — I132 Hypertensive heart and chronic kidney disease with heart failure and with stage 5 chronic kidney disease, or end stage renal disease: Secondary | ICD-10-CM | POA: Diagnosis not present

## 2021-10-12 DIAGNOSIS — Z992 Dependence on renal dialysis: Secondary | ICD-10-CM | POA: Insufficient documentation

## 2021-10-12 DIAGNOSIS — G4730 Sleep apnea, unspecified: Secondary | ICD-10-CM | POA: Diagnosis not present

## 2021-10-12 DIAGNOSIS — N186 End stage renal disease: Secondary | ICD-10-CM | POA: Insufficient documentation

## 2021-10-12 DIAGNOSIS — T82898A Other specified complication of vascular prosthetic devices, implants and grafts, initial encounter: Secondary | ICD-10-CM

## 2021-10-12 HISTORY — PX: LIGATION OF COMPETING BRANCHES OF ARTERIOVENOUS FISTULA: SHX5949

## 2021-10-12 LAB — POCT I-STAT, CHEM 8
BUN: 58 mg/dL — ABNORMAL HIGH (ref 6–20)
Calcium, Ion: 1.11 mmol/L — ABNORMAL LOW (ref 1.15–1.40)
Chloride: 102 mmol/L (ref 98–111)
Creatinine, Ser: 14.3 mg/dL — ABNORMAL HIGH (ref 0.61–1.24)
Glucose, Bld: 89 mg/dL (ref 70–99)
HCT: 33 % — ABNORMAL LOW (ref 39.0–52.0)
Hemoglobin: 11.2 g/dL — ABNORMAL LOW (ref 13.0–17.0)
Potassium: 4.1 mmol/L (ref 3.5–5.1)
Sodium: 137 mmol/L (ref 135–145)
TCO2: 25 mmol/L (ref 22–32)

## 2021-10-12 SURGERY — LIGATION OF COMPETING BRANCHES OF ARTERIOVENOUS FISTULA
Anesthesia: Monitor Anesthesia Care | Site: Arm Upper | Laterality: Left

## 2021-10-12 MED ORDER — HYDROCODONE-ACETAMINOPHEN 5-325 MG PO TABS
1.0000 | ORAL_TABLET | Freq: Four times a day (QID) | ORAL | 0 refills | Status: DC | PRN
Start: 2021-10-12 — End: 2022-02-25

## 2021-10-12 MED ORDER — CHLORHEXIDINE GLUCONATE 4 % EX LIQD: 60.0000 mL | Freq: Once | CUTANEOUS | Status: DC

## 2021-10-12 MED ORDER — SODIUM CHLORIDE 0.9 % IV SOLN: INTRAVENOUS | Status: DC

## 2021-10-12 MED ORDER — HYDROCODONE-ACETAMINOPHEN 5-325 MG PO TABS
1.0000 | ORAL_TABLET | Freq: Four times a day (QID) | ORAL | 0 refills | Status: DC | PRN
Start: 2021-10-12 — End: 2021-10-12

## 2021-10-12 MED ORDER — ROPIVACAINE HCL 5 MG/ML IJ SOLN
INTRAMUSCULAR | Status: DC | PRN
  Administered 2021-10-12: 10 mL via PERINEURAL

## 2021-10-12 MED ORDER — FENTANYL CITRATE (PF) 100 MCG/2ML IJ SOLN: 50.0000 ug | Freq: Once | INTRAMUSCULAR | Status: AC

## 2021-10-12 MED ORDER — ACETAMINOPHEN 10 MG/ML IV SOLN: 1000.0000 mg | Freq: Once | INTRAVENOUS | Status: DC | PRN

## 2021-10-12 MED ORDER — CARVEDILOL 12.5 MG PO TABS
ORAL_TABLET | ORAL | Status: AC
  Filled 2021-10-12: qty 1

## 2021-10-12 MED ORDER — HEPARIN 6000 UNIT IRRIGATION SOLUTION
Status: DC | PRN
  Administered 2021-10-12: 1

## 2021-10-12 MED ORDER — CARVEDILOL 12.5 MG PO TABS
12.5000 mg | ORAL_TABLET | Freq: Once | ORAL | Status: AC
  Administered 2021-10-12: 12.5 mg via ORAL

## 2021-10-12 MED ORDER — 0.9 % SODIUM CHLORIDE (POUR BTL) OPTIME
TOPICAL | Status: DC | PRN
  Administered 2021-10-12: 1000 mL

## 2021-10-12 MED ORDER — FENTANYL CITRATE (PF) 100 MCG/2ML IJ SOLN
INTRAMUSCULAR | Status: AC
  Administered 2021-10-12: 50 ug via INTRAVENOUS
  Filled 2021-10-12: qty 2

## 2021-10-12 MED ORDER — CEFAZOLIN IN SODIUM CHLORIDE 3-0.9 GM/100ML-% IV SOLN
3.0000 g | INTRAVENOUS | Status: DC
  Filled 2021-10-12: qty 100

## 2021-10-12 MED ORDER — MIDAZOLAM HCL 2 MG/2ML IJ SOLN
INTRAMUSCULAR | Status: AC
  Administered 2021-10-12: 1 mg via INTRAVENOUS
  Filled 2021-10-12: qty 2

## 2021-10-12 MED ORDER — FENTANYL CITRATE (PF) 100 MCG/2ML IJ SOLN: 25.0000 ug | INTRAMUSCULAR | Status: DC | PRN

## 2021-10-12 MED ORDER — MIDAZOLAM HCL 2 MG/2ML IJ SOLN: 1.0000 mg | Freq: Once | INTRAMUSCULAR | Status: AC

## 2021-10-12 MED ORDER — HEPARIN 6000 UNIT IRRIGATION SOLUTION
Status: AC
  Filled 2021-10-12: qty 500

## 2021-10-12 MED ORDER — MIDAZOLAM HCL 2 MG/2ML IJ SOLN
INTRAMUSCULAR | Status: AC
  Filled 2021-10-12: qty 2

## 2021-10-12 MED ORDER — MEPIVACAINE HCL (PF) 2 % IJ SOLN
INTRAMUSCULAR | Status: DC | PRN
  Administered 2021-10-12: 20 mL

## 2021-10-12 MED ORDER — LIDOCAINE HCL (PF) 1 % IJ SOLN
INTRAMUSCULAR | Status: AC
  Filled 2021-10-12: qty 30

## 2021-10-12 MED ORDER — CHLORHEXIDINE GLUCONATE 0.12 % MT SOLN
OROMUCOSAL | Status: AC
  Administered 2021-10-12: 15 mL via OROMUCOSAL
  Filled 2021-10-12: qty 15

## 2021-10-12 MED ORDER — LACTATED RINGERS IV SOLN: INTRAVENOUS | Status: DC

## 2021-10-12 MED ORDER — FENTANYL CITRATE (PF) 100 MCG/2ML IJ SOLN
INTRAMUSCULAR | Status: AC
  Filled 2021-10-12: qty 2

## 2021-10-12 MED ORDER — CHLORHEXIDINE GLUCONATE 0.12 % MT SOLN: 15.0000 mL | Freq: Once | OROMUCOSAL | Status: AC

## 2021-10-12 MED ORDER — PROPOFOL 500 MG/50ML IV EMUL
INTRAVENOUS | Status: DC | PRN
  Administered 2021-10-12: 75 ug/kg/min via INTRAVENOUS

## 2021-10-12 MED ORDER — ORAL CARE MOUTH RINSE: 15.0000 mL | Freq: Once | OROMUCOSAL | Status: AC

## 2021-10-12 SURGICAL SUPPLY — 36 items
ADH SKN CLS APL DERMABOND .7 (GAUZE/BANDAGES/DRESSINGS) ×1
ARMBAND PINK RESTRICT EXTREMIT (MISCELLANEOUS) ×2 IMPLANT
BAG COUNTER SPONGE SURGICOUNT (BAG) ×2 IMPLANT
BAG SPNG CNTER NS LX DISP (BAG) ×1
CANISTER SUCT 3000ML PPV (MISCELLANEOUS) ×2 IMPLANT
CLIP LIGATING EXTRA MED SLVR (CLIP) IMPLANT
CLIP LIGATING EXTRA SM BLUE (MISCELLANEOUS) IMPLANT
COVER PROBE W GEL 5X96 (DRAPES) ×2 IMPLANT
DERMABOND ADVANCED (GAUZE/BANDAGES/DRESSINGS) ×1
DERMABOND ADVANCED .7 DNX12 (GAUZE/BANDAGES/DRESSINGS) ×2 IMPLANT
ELECT REM PT RETURN 9FT ADLT (ELECTROSURGICAL) ×1
ELECTRODE REM PT RTRN 9FT ADLT (ELECTROSURGICAL) ×2 IMPLANT
GLOVE BIO SURGEON STRL SZ7.5 (GLOVE) ×2 IMPLANT
GLOVE BIOGEL PI IND STRL 6.5 (GLOVE) IMPLANT
GLOVE BIOGEL PI IND STRL 8 (GLOVE) ×2 IMPLANT
GLOVE BIOGEL PI INDICATOR 6.5 (GLOVE) ×1
GLOVE BIOGEL PI INDICATOR 8 (GLOVE) ×1
GOWN STRL REUS W/ TWL LRG LVL3 (GOWN DISPOSABLE) ×4 IMPLANT
GOWN STRL REUS W/ TWL XL LVL3 (GOWN DISPOSABLE) ×4 IMPLANT
GOWN STRL REUS W/TWL LRG LVL3 (GOWN DISPOSABLE) ×2
GOWN STRL REUS W/TWL XL LVL3 (GOWN DISPOSABLE) ×2
KIT BASIN OR (CUSTOM PROCEDURE TRAY) ×2 IMPLANT
KIT TURNOVER KIT B (KITS) ×2 IMPLANT
NS IRRIG 1000ML POUR BTL (IV SOLUTION) ×2 IMPLANT
PACK CV ACCESS (CUSTOM PROCEDURE TRAY) ×2 IMPLANT
PAD ARMBOARD 7.5X6 YLW CONV (MISCELLANEOUS) ×4 IMPLANT
STOCKINETTE 6  STRL (DRAPES) ×1
STOCKINETTE 6 STRL (DRAPES) IMPLANT
SUT MNCRL AB 4-0 PS2 18 (SUTURE) ×4 IMPLANT
SUT PROLENE 6 0 BV (SUTURE) IMPLANT
SUT SILK 0 TIES 10X30 (SUTURE) ×2 IMPLANT
SUT VIC AB 3-0 SH 27 (SUTURE) ×2
SUT VIC AB 3-0 SH 27X BRD (SUTURE) ×2 IMPLANT
TOWEL GREEN STERILE (TOWEL DISPOSABLE) ×2 IMPLANT
UNDERPAD 30X36 HEAVY ABSORB (UNDERPADS AND DIAPERS) ×2 IMPLANT
WATER STERILE IRR 1000ML POUR (IV SOLUTION) ×2 IMPLANT

## 2021-10-12 NOTE — Transfer of Care (Signed)
Immediate Anesthesia Transfer of Care Note  Patient: Johnny [name]  Procedure(s) Performed: LIGATION OF SIDE BRANCHES OF LEFT ARM FISTULA (Left: Arm Upper)  Patient Location: PACU  Anesthesia Type:MAC combined with regional for post-op pain  Level of Consciousness: awake, alert  and oriented  Airway & Oxygen Therapy: Patient Spontanous Breathing  Post-op Assessment: Report given to RN and Post -op Vital signs reviewed and stable  Post vital signs: Reviewed and stable  Last Vitals:  Vitals Value Taken Time  BP 114/76 10/12/21 1903  Temp    Pulse 66 10/12/21 1907  Resp 20 10/12/21 1907  SpO2 98 % 10/12/21 1907  Vitals shown include unvalidated device data.  Last Pain:  Vitals:   10/12/21 1430  TempSrc:   PainSc: 0-No pain      Patients Stated Pain Goal: 0 (56/38/93 7342)  Complications: No notable events documented.

## 2021-10-12 NOTE — Progress Notes (Signed)
Orthopedic Tech Progress Note Patient Details:  Johnny [name] 1960/06/12 [id number]  Ortho Devices Type of Ortho Device: Arm sling Ortho Device/Splint Location: delivered to rn Ortho Device/Splint Interventions: Renard Matter 10/12/2021, 9:26 PM

## 2021-10-12 NOTE — Discharge Instructions (Signed)
° °  [facility] [place] ° °Discharge Instructions ° °AV Fistula or Graft Surgery for Dialysis Access ° °Please refer to the following instructions for your post-procedure care. Your surgeon or physician assistant will discuss any changes with you. ° °Activity ° °You may drive the day following your surgery, if you are comfortable and no longer taking prescription pain medication. Resume full activity as the soreness in your incision resolves. ° °Bathing/Showering ° °You may shower after you go home. Keep your incision dry for 48 hours. Do not soak in a bathtub, hot tub, or swim until the incision heals completely. You may not shower if you have a hemodialysis catheter. ° °Incision Care ° °Clean your incision with mild soap and water after 48 hours. Pat the area dry with a clean towel. You do not need a bandage unless otherwise instructed. Do not apply any ointments or creams to your incision. You may have skin glue on your incision. Do not peel it off. It will come off on its own in about one week. Your arm may swell a bit after surgery. To reduce swelling use pillows to elevate your arm so it is above your heart. Your doctor will tell you if you need to lightly wrap your arm with an ACE bandage. ° °Diet ° °Resume your normal diet. There are not special food restrictions following this procedure. In order to heal from your surgery, it is CRITICAL to get adequate nutrition. Your body requires vitamins, minerals, and protein. Vegetables are the best source of vitamins and minerals. Vegetables also provide the perfect balance of protein. Processed food has little nutritional value, so try to avoid this. ° °Medications ° °Resume taking all of your medications. If your incision is causing pain, you may take over-the counter pain relievers such as acetaminophen (Tylenol). If you were prescribed a stronger pain medication, please be aware these medications can cause nausea and constipation. Prevent  nausea by taking the medication with a snack or meal. Avoid constipation by drinking plenty of fluids and eating foods with high amount of fiber, such as fruits, vegetables, and grains. Do not take Tylenol if you are taking prescription pain medications. ° ° ° ° °Follow up °Your surgeon may want to see you in the office following your access surgery. If so, this will be arranged at the time of your surgery. ° °Please call us immediately for any of the following conditions: ° °Increased pain, redness, drainage (pus) from your incision site °Fever of 101 degrees or higher °Severe or worsening pain at your incision site °Hand pain or numbness. ° °Reduce your risk of vascular disease: ° °Stop smoking. If you would like help, call [facility] at 1-800-QUIT-NOW (1-800-784-8669) or [place] at 336-586-4000 ° °Manage your cholesterol °Maintain a desired weight °Control your diabetes °Keep your blood pressure down ° °Dialysis ° °It will take several weeks to several months for your new dialysis access to be ready for use. Your surgeon will determine when it is OK to use it. Your nephrologist will continue to direct your dialysis. You can continue to use your Permcath until your new access is ready for use. ° °If you have any questions, please call the office at [phone]. ° °

## 2021-10-12 NOTE — H&P (Signed)
History and Physical Interval Note:  10/12/2021 2:06 PM  Johnny [name]  has presented today for surgery, with the diagnosis of ESRD.  The various methods of treatment have been discussed with the patient and family. After consideration of risks, benefits and other options for treatment, the patient has consented to  Procedure(s): LIGATION OF SIDE BRANCHES OF LEFT ARM FISTULA (Left) as a surgical intervention.  The patient's history has been reviewed, patient examined, no change in status, stable for surgery.  I have reviewed the patient's chart and labs.  Questions were answered to the patient's satisfaction.     Johnny [name]  POST OPERATIVE OFFICE NOTE       CC:  F/u for surgery   HPI:  This is a 61 y.o. male who is s/p left RC AVF on 06/03/2021 by Dr. Carlis Abbott.  He has hx of right IJ TDC placed on 05/30/2021 by IR.    Pt was seen on 09/01/2021 and at that time, his AVF had an excellent thrill.  The fistula was slow to mature and had multiple side branches.  Dr. Carlis Abbott offered his surgery to ligate these branches, but at the time pt was not interested.  He was instructed to [place] an exercise ball daily for the next month and come back for repeat duplex in 4-5 weeks.  He returns today for that visit.     Pt states he does not have pain/numbness in the left hand.  He states he has been working with the squeeze ball.     The pt is on dialysis M/W/F at [place] location.          Allergies  Allergen Reactions   Lisinopril Swelling and Other (See Comments)      Angioedema            Current Outpatient Medications  Medication Sig Dispense Refill   amLODipine (NORVASC) 5 MG tablet Take 1 tablet (5 mg total) by mouth daily. 30 tablet 0   ascorbic acid (VITAMIN C) 500 MG tablet Take 1 tablet (500 mg total) by mouth daily. 30 tablet 0   calcitRIOL (ROCALTROL) 0.5 MCG capsule Take 2 capsules (1 mcg total) by mouth daily. 30 capsule 0   carvedilol (COREG) 12.5 MG tablet Take 1 tablet (12.5 mg  total) by mouth 2 (two) times daily with a meal. (Patient not taking: Reported on 09/01/2021) 60 tablet 0   cholecalciferol (VITAMIN D3) 25 MCG (1000 UNIT) tablet Take 1,000 Units by mouth daily.       zinc sulfate 220 (50 Zn) MG capsule Take 1 capsule (220 mg total) by mouth daily. 14 capsule 0    No current facility-administered medications for this visit.       ROS:  See HPI and unchanged from previous visit   Physical Exam:      Today's Vitals    10/06/21 0756  BP: 115/76  Pulse: 78  Resp: 20  Temp: 98.2 F (36.8 C)  TempSrc: Temporal  SpO2: 97%  Weight: 291 lb 1.6 oz (132 kg)  Height: '5\' 9"'$  (1.753 m)    Body mass index is 42.99 kg/m.   General:  no distress Lungs: non labored Cardiac:  regular Neuro: intact Incision:  healed nicely Vascular: There is a palpable left radial pulse.   Extremities:   Motor and sensory are in tact.   There is a thrill present.  The fistula is easily palpable     Dialysis Duplex on 10/06/2021: Findings:  +--------------------+----------+-----------------+--------+  AVF                 PSV (cm/s)Flow Vol (mL/min)Comments  +--------------------+----------+-----------------+--------+  Native artery inflow   198           660                 +--------------------+----------+-----------------+--------+  AVF Anastomosis        509                               +--------------------+----------+-----------------+--------+      +------------+----------+-------------+----------+--------+  OUTFLOW VEINPSV (cm/s)Diameter (cm)Depth (cm)Describe  +------------+----------+-------------+----------+--------+  Dist UA         56        0.54        0.36             +------------+----------+-------------+----------+--------+  AC Fossa        92        0.48        0.22             +------------+----------+-------------+----------+--------+  Mid Forearm    341        0.53        0.20              +------------+----------+-------------+----------+--------+  Dist Forearm   479        0.39        0.14             +------------+----------+-------------+----------+--------+     Assessment/Plan:  This is a 61 y.o. male who is s/p: left RC AVF on 06/03/2021 by Dr. Carlis Abbott.  He has hx of right IJ TDC placed on 05/30/2021 by IR.    -the pt does not have evidence of steal. -the fistula has matured slightly from last visit, but still not big enough to access.  Dr. Carlis Abbott had discussed with him at last visit about branch ligation but he wanted to try conservative measures.  Discussed with him about branch ligation and he understands this gives the fistula a chance to mature, but may not and may need more procedures or even new access.  Will schedule this with Dr. Carlis Abbott on pt's non dialysis day. -discussed with him that we need to get permanent access before his [facility] can be removed.  -discussed with pt that access does not last forever and will need intervention or even new access at some point.  -he is not on blood thinners     [name], [facility] [phone]   Clinic MD:  Carlis Abbott

## 2021-10-12 NOTE — Anesthesia Procedure Notes (Signed)
Procedure Name: MAC Date/Time: 10/12/2021 6:05 PM  Performed by: Eligha Bridegroom, CRNAPre-anesthesia Checklist: Patient identified, Emergency Drugs available, Suction available, Patient being monitored and Timeout performed Patient Re-evaluated:Patient Re-evaluated prior to induction Oxygen Delivery Method: Nasal cannula Preoxygenation: Pre-oxygenation with 100% oxygen Induction Type: IV induction

## 2021-10-12 NOTE — Anesthesia Procedure Notes (Signed)
Anesthesia Regional Block: Supraclavicular block   Pre-Anesthetic Checklist: , timeout performed,  Correct Patient, Correct Site, Correct Laterality,  Correct Procedure, Correct Position, site marked,  Risks and benefits discussed,  Surgical consent,  Pre-op evaluation,  At surgeon's request and post-op pain management  Laterality: Left  Prep: Dura Prep       Needles:  Injection technique: Single-shot  Needle Type: Echogenic Stimulator Needle     Needle Length: 5cm  Needle Gauge: 20     Additional Needles:   Procedures:,,,, ultrasound used (permanent image in chart),,    Narrative:  Start time: 10/12/2021 2:23 PM End time: 10/12/2021 2:28 PM Injection made incrementally with aspirations every 5 mL.  Performed by: Personally  Anesthesiologist: Darral Dash, DO  Additional Notes: Patient identified. Risks/Benefits/Options discussed with patient including but not limited to bleeding, infection, nerve damage, failed block, incomplete pain control. Patient expressed understanding and wished to proceed. All questions were answered. Sterile technique was used throughout the entire procedure. Please see nursing notes for vital signs. Aspirated in 5cc intervals with injection for negative confirmation. Patient was given instructions on fall risk and not to get out of bed. All questions and concerns addressed with instructions to call with any issues or inadequate analgesia.

## 2021-10-12 NOTE — Op Note (Signed)
    NAME: Johnny [name]    MRN: [id number] DOB: 01-Sep-1960    DATE OF OPERATION: 10/12/2021  PREOP DIAGNOSIS:    End-stage renal disease  POSTOP DIAGNOSIS:    Same  PROCEDURE:    Left arm AV fistula branch ligation  SURGEON: [name]  ASSIST: Dagoberto Ligas, PA  ANESTHESIA: Block  EBL: 5 mL  INDICATIONS:    [name] is a 61 y.o. male with end-stage renal disease and history of left-sided radiocephalic fistula.  Maturation has been hindered by multiple branches.  After discussing the risks and benefits of branch ligation in an effort to improve flow through his radiocephalic fistula, [name] elected to proceed.  FINDINGS:   3 large branches ligated  TECHNIQUE:   Patient was brought to the OR laid in supine position.  Moderate anesthesia was induced and the patient was prepped and draped in standard fashion.  An ultrasound was brought onto the field and used to insonate the entirety of the cephalic vein.  3 large branches were noted and marked.  Next, Three, 3 cm longitudinal incisions were made along the radiocephalic fistula at the site of branching.  Branches were identified, and ligated with 2-0 silk.  The wound bed was irrigated with copious amounts of saline, and closed with a deep Vicryl layer, Monocryl and Dermabond at the level of the skin.  Macie Burows, MD [facility] DATE OF DICTATION:   10/12/2021

## 2021-10-12 NOTE — Anesthesia Preprocedure Evaluation (Addendum)
Anesthesia Evaluation  Patient identified by MRN, date of birth, ID band Patient awake    Reviewed: Allergy & Precautions, NPO status , Patient's Chart, lab work & pertinent test results  Airway Mallampati: II  TM Distance: >3 FB Neck ROM: Full    Dental  (+) Edentulous Upper, Edentulous Lower   Pulmonary sleep apnea ,    Pulmonary exam normal        Cardiovascular hypertension, Pt. on medications and Pt. on home beta blockers +CHF   Rhythm:Regular Rate:Normal     Neuro/Psych negative neurological ROS  negative psych ROS   GI/Hepatic negative GI ROS, Neg liver ROS,   Endo/Other  negative endocrine ROS  Renal/GU ESRF and DialysisRenal disease  negative genitourinary   Musculoskeletal negative musculoskeletal ROS (+)   Abdominal Normal abdominal exam  (+)   Peds  Hematology  (+) Blood dyscrasia, anemia ,   Anesthesia Other Findings   Reproductive/Obstetrics                            Anesthesia Physical Anesthesia Plan  ASA: 3  Anesthesia Plan: MAC and Regional   Post-op Pain Management: Regional block*   Induction: Intravenous  PONV Risk Score and Plan: 1 and Ondansetron, Dexamethasone, Propofol infusion and Treatment may vary due to age or medical condition  Airway Management Planned: Simple Face Mask, Nasal Cannula and Natural Airway  Additional Equipment: None  Intra-op Plan:   Post-operative Plan:   Informed Consent: I have reviewed the patients History and Physical, chart, labs and discussed the procedure including the risks, benefits and alternatives for the proposed anesthesia with the patient or authorized representative who has indicated his/her understanding and acceptance.     Dental advisory given  Plan Discussed with: CRNA  Anesthesia Plan Comments: (Lab Results      Component                Value               Date                      WBC                       8.9                 08/27/2021                HGB                      11.2 (L)            10/12/2021                HCT                      33.0 (L)            10/12/2021                MCV                      94.6                08/27/2021                PLT  174                 08/27/2021           Lab Results      Component                Value               Date                      NA                       137                 10/12/2021                K                        4.1                 10/12/2021                CO2                      24                  08/27/2021                GLUCOSE                  89                  10/12/2021                BUN                      58 (H)              10/12/2021                CREATININE               14.30 (H)           10/12/2021                CALCIUM                  8.8 (L)             08/27/2021                EGFR                     11 (L)              06/11/2021                GFRNONAA                 7 (L)               08/27/2021          )        Anesthesia Quick Evaluation

## 2021-10-13 ENCOUNTER — Encounter (HOSPITAL_COMMUNITY): Payer: Self-pay | Admitting: Vascular Surgery

## 2021-10-13 DIAGNOSIS — D631 Anemia in chronic kidney disease: Secondary | ICD-10-CM | POA: Diagnosis not present

## 2021-10-13 DIAGNOSIS — N185 Chronic kidney disease, stage 5: Secondary | ICD-10-CM | POA: Diagnosis not present

## 2021-10-13 DIAGNOSIS — N186 End stage renal disease: Secondary | ICD-10-CM | POA: Diagnosis not present

## 2021-10-13 DIAGNOSIS — T8249XA Other complication of vascular dialysis catheter, initial encounter: Secondary | ICD-10-CM | POA: Diagnosis not present

## 2021-10-13 DIAGNOSIS — N2581 Secondary hyperparathyroidism of renal origin: Secondary | ICD-10-CM | POA: Diagnosis not present

## 2021-10-13 DIAGNOSIS — Z992 Dependence on renal dialysis: Secondary | ICD-10-CM | POA: Diagnosis not present

## 2021-10-13 DIAGNOSIS — D688 Other specified coagulation defects: Secondary | ICD-10-CM | POA: Diagnosis not present

## 2021-10-14 ENCOUNTER — Encounter (HOSPITAL_COMMUNITY): Payer: Self-pay | Admitting: Vascular Surgery

## 2021-10-14 DIAGNOSIS — N2581 Secondary hyperparathyroidism of renal origin: Secondary | ICD-10-CM | POA: Diagnosis not present

## 2021-10-14 DIAGNOSIS — D631 Anemia in chronic kidney disease: Secondary | ICD-10-CM | POA: Diagnosis not present

## 2021-10-14 DIAGNOSIS — T8249XA Other complication of vascular dialysis catheter, initial encounter: Secondary | ICD-10-CM | POA: Diagnosis not present

## 2021-10-14 DIAGNOSIS — N185 Chronic kidney disease, stage 5: Secondary | ICD-10-CM | POA: Diagnosis not present

## 2021-10-14 DIAGNOSIS — Z992 Dependence on renal dialysis: Secondary | ICD-10-CM | POA: Diagnosis not present

## 2021-10-14 DIAGNOSIS — D688 Other specified coagulation defects: Secondary | ICD-10-CM | POA: Diagnosis not present

## 2021-10-14 DIAGNOSIS — N186 End stage renal disease: Secondary | ICD-10-CM | POA: Diagnosis not present

## 2021-10-14 NOTE — Anesthesia Postprocedure Evaluation (Signed)
Anesthesia Post Note  Patient: Johnny [name]  Procedure(s) Performed: LIGATION OF SIDE BRANCHES OF LEFT ARM FISTULA (Left: Arm Upper)     Patient location during evaluation: PACU Anesthesia Type: Regional and MAC Level of consciousness: awake and alert Pain management: pain level controlled Vital Signs Assessment: post-procedure vital signs reviewed and stable Respiratory status: spontaneous breathing, nonlabored ventilation, respiratory function stable and patient connected to nasal cannula oxygen Cardiovascular status: stable and blood pressure returned to baseline Postop Assessment: no apparent nausea or vomiting Anesthetic complications: no   No notable events documented.  Last Vitals:  Vitals:   10/12/21 2000 10/12/21 2010  BP: (!) 143/90 137/80  Pulse: 60 62  Resp: 19 20  Temp:    SpO2: 98% 97%    Last Pain:  Vitals:   10/12/21 2000  TempSrc:   PainSc: 0-No pain   Pain Goal: Patients Stated Pain Goal: 0 (10/12/21 1157)                 [place]

## 2021-10-16 DIAGNOSIS — T8249XA Other complication of vascular dialysis catheter, initial encounter: Secondary | ICD-10-CM | POA: Diagnosis not present

## 2021-10-16 DIAGNOSIS — D631 Anemia in chronic kidney disease: Secondary | ICD-10-CM | POA: Diagnosis not present

## 2021-10-16 DIAGNOSIS — Z992 Dependence on renal dialysis: Secondary | ICD-10-CM | POA: Diagnosis not present

## 2021-10-16 DIAGNOSIS — N186 End stage renal disease: Secondary | ICD-10-CM | POA: Diagnosis not present

## 2021-10-16 DIAGNOSIS — N185 Chronic kidney disease, stage 5: Secondary | ICD-10-CM | POA: Diagnosis not present

## 2021-10-16 DIAGNOSIS — D688 Other specified coagulation defects: Secondary | ICD-10-CM | POA: Diagnosis not present

## 2021-10-16 DIAGNOSIS — N2581 Secondary hyperparathyroidism of renal origin: Secondary | ICD-10-CM | POA: Diagnosis not present

## 2021-10-19 DIAGNOSIS — Z992 Dependence on renal dialysis: Secondary | ICD-10-CM | POA: Diagnosis not present

## 2021-10-19 DIAGNOSIS — D631 Anemia in chronic kidney disease: Secondary | ICD-10-CM | POA: Diagnosis not present

## 2021-10-19 DIAGNOSIS — N186 End stage renal disease: Secondary | ICD-10-CM | POA: Diagnosis not present

## 2021-10-19 DIAGNOSIS — D688 Other specified coagulation defects: Secondary | ICD-10-CM | POA: Diagnosis not present

## 2021-10-19 DIAGNOSIS — T8249XA Other complication of vascular dialysis catheter, initial encounter: Secondary | ICD-10-CM | POA: Diagnosis not present

## 2021-10-19 DIAGNOSIS — N2581 Secondary hyperparathyroidism of renal origin: Secondary | ICD-10-CM | POA: Diagnosis not present

## 2021-10-19 DIAGNOSIS — N185 Chronic kidney disease, stage 5: Secondary | ICD-10-CM | POA: Diagnosis not present

## 2021-10-21 DIAGNOSIS — T8249XA Other complication of vascular dialysis catheter, initial encounter: Secondary | ICD-10-CM | POA: Diagnosis not present

## 2021-10-21 DIAGNOSIS — N186 End stage renal disease: Secondary | ICD-10-CM | POA: Diagnosis not present

## 2021-10-21 DIAGNOSIS — D688 Other specified coagulation defects: Secondary | ICD-10-CM | POA: Diagnosis not present

## 2021-10-21 DIAGNOSIS — N185 Chronic kidney disease, stage 5: Secondary | ICD-10-CM | POA: Diagnosis not present

## 2021-10-21 DIAGNOSIS — N2581 Secondary hyperparathyroidism of renal origin: Secondary | ICD-10-CM | POA: Diagnosis not present

## 2021-10-21 DIAGNOSIS — Z992 Dependence on renal dialysis: Secondary | ICD-10-CM | POA: Diagnosis not present

## 2021-10-21 DIAGNOSIS — D631 Anemia in chronic kidney disease: Secondary | ICD-10-CM | POA: Diagnosis not present

## 2021-10-22 DIAGNOSIS — Z992 Dependence on renal dialysis: Secondary | ICD-10-CM | POA: Diagnosis not present

## 2021-10-22 DIAGNOSIS — N186 End stage renal disease: Secondary | ICD-10-CM | POA: Diagnosis not present

## 2021-10-22 DIAGNOSIS — I120 Hypertensive chronic kidney disease with stage 5 chronic kidney disease or end stage renal disease: Secondary | ICD-10-CM | POA: Diagnosis not present

## 2021-10-23 DIAGNOSIS — T8249XA Other complication of vascular dialysis catheter, initial encounter: Secondary | ICD-10-CM | POA: Diagnosis not present

## 2021-10-23 DIAGNOSIS — D688 Other specified coagulation defects: Secondary | ICD-10-CM | POA: Diagnosis not present

## 2021-10-23 DIAGNOSIS — D631 Anemia in chronic kidney disease: Secondary | ICD-10-CM | POA: Diagnosis not present

## 2021-10-23 DIAGNOSIS — N185 Chronic kidney disease, stage 5: Secondary | ICD-10-CM | POA: Diagnosis not present

## 2021-10-23 DIAGNOSIS — Z992 Dependence on renal dialysis: Secondary | ICD-10-CM | POA: Diagnosis not present

## 2021-10-23 DIAGNOSIS — N2581 Secondary hyperparathyroidism of renal origin: Secondary | ICD-10-CM | POA: Diagnosis not present

## 2021-10-23 DIAGNOSIS — N186 End stage renal disease: Secondary | ICD-10-CM | POA: Diagnosis not present

## 2021-10-26 DIAGNOSIS — T8249XA Other complication of vascular dialysis catheter, initial encounter: Secondary | ICD-10-CM | POA: Diagnosis not present

## 2021-10-26 DIAGNOSIS — N186 End stage renal disease: Secondary | ICD-10-CM | POA: Diagnosis not present

## 2021-10-26 DIAGNOSIS — Z992 Dependence on renal dialysis: Secondary | ICD-10-CM | POA: Diagnosis not present

## 2021-10-26 DIAGNOSIS — D688 Other specified coagulation defects: Secondary | ICD-10-CM | POA: Diagnosis not present

## 2021-10-26 DIAGNOSIS — N185 Chronic kidney disease, stage 5: Secondary | ICD-10-CM | POA: Diagnosis not present

## 2021-10-26 DIAGNOSIS — D631 Anemia in chronic kidney disease: Secondary | ICD-10-CM | POA: Diagnosis not present

## 2021-10-26 DIAGNOSIS — N2581 Secondary hyperparathyroidism of renal origin: Secondary | ICD-10-CM | POA: Diagnosis not present

## 2021-10-28 DIAGNOSIS — D631 Anemia in chronic kidney disease: Secondary | ICD-10-CM | POA: Diagnosis not present

## 2021-10-28 DIAGNOSIS — N2581 Secondary hyperparathyroidism of renal origin: Secondary | ICD-10-CM | POA: Diagnosis not present

## 2021-10-28 DIAGNOSIS — N186 End stage renal disease: Secondary | ICD-10-CM | POA: Diagnosis not present

## 2021-10-28 DIAGNOSIS — N185 Chronic kidney disease, stage 5: Secondary | ICD-10-CM | POA: Diagnosis not present

## 2021-10-28 DIAGNOSIS — T8249XA Other complication of vascular dialysis catheter, initial encounter: Secondary | ICD-10-CM | POA: Diagnosis not present

## 2021-10-28 DIAGNOSIS — D688 Other specified coagulation defects: Secondary | ICD-10-CM | POA: Diagnosis not present

## 2021-10-28 DIAGNOSIS — Z992 Dependence on renal dialysis: Secondary | ICD-10-CM | POA: Diagnosis not present

## 2021-10-30 DIAGNOSIS — T8249XA Other complication of vascular dialysis catheter, initial encounter: Secondary | ICD-10-CM | POA: Diagnosis not present

## 2021-10-30 DIAGNOSIS — N185 Chronic kidney disease, stage 5: Secondary | ICD-10-CM | POA: Diagnosis not present

## 2021-10-30 DIAGNOSIS — D631 Anemia in chronic kidney disease: Secondary | ICD-10-CM | POA: Diagnosis not present

## 2021-10-30 DIAGNOSIS — N186 End stage renal disease: Secondary | ICD-10-CM | POA: Diagnosis not present

## 2021-10-30 DIAGNOSIS — D688 Other specified coagulation defects: Secondary | ICD-10-CM | POA: Diagnosis not present

## 2021-10-30 DIAGNOSIS — Z992 Dependence on renal dialysis: Secondary | ICD-10-CM | POA: Diagnosis not present

## 2021-10-30 DIAGNOSIS — N2581 Secondary hyperparathyroidism of renal origin: Secondary | ICD-10-CM | POA: Diagnosis not present

## 2021-11-02 DIAGNOSIS — D688 Other specified coagulation defects: Secondary | ICD-10-CM | POA: Diagnosis not present

## 2021-11-02 DIAGNOSIS — N185 Chronic kidney disease, stage 5: Secondary | ICD-10-CM | POA: Diagnosis not present

## 2021-11-02 DIAGNOSIS — Z992 Dependence on renal dialysis: Secondary | ICD-10-CM | POA: Diagnosis not present

## 2021-11-02 DIAGNOSIS — N186 End stage renal disease: Secondary | ICD-10-CM | POA: Diagnosis not present

## 2021-11-02 DIAGNOSIS — N2581 Secondary hyperparathyroidism of renal origin: Secondary | ICD-10-CM | POA: Diagnosis not present

## 2021-11-02 DIAGNOSIS — D631 Anemia in chronic kidney disease: Secondary | ICD-10-CM | POA: Diagnosis not present

## 2021-11-02 DIAGNOSIS — T8249XA Other complication of vascular dialysis catheter, initial encounter: Secondary | ICD-10-CM | POA: Diagnosis not present

## 2021-11-02 NOTE — Progress Notes (Unsigned)
  POST OPERATIVE OFFICE NOTE    CC:  F/u for surgery  HPI:  This is a 61 y.o. male who is s/p left Brachiocephalic, followed  by branch ligation on 10/12/21 to assist with maturity of the fistula.   He is here for follow incision checks and maturity of fistula check.   Pt returns today for follow up.  Pt states he has had no loss of motor or decreased sensation in the left UE.  No symptoms of steal.  The pt is on dialysis M/W/F at [place] location.   Allergies  Allergen Reactions   Lisinopril Swelling and Other (See Comments)    Angioedema    Current Outpatient Medications  Medication Sig Dispense Refill   ascorbic acid (VITAMIN C) 500 MG tablet Take 1 tablet (500 mg total) by mouth daily. 30 tablet 0   calcitRIOL (ROCALTROL) 0.5 MCG capsule Take 2 capsules (1 mcg total) by mouth daily. 30 capsule 0   carvedilol (COREG) 12.5 MG tablet Take 1 tablet (12.5 mg total) by mouth 2 (two) times daily with a meal. 60 tablet 0   HYDROcodone-acetaminophen (NORCO) 5-325 MG tablet Take 1 tablet by mouth every 6 (six) hours as needed for moderate pain. 10 tablet 0   RENVELA 800 MG tablet Take 1,600 mg by mouth 3 (three) times daily.     VITAMIN D PO Take 1 tablet by mouth daily.     zinc sulfate 220 (50 Zn) MG capsule Take 1 capsule (220 mg total) by mouth daily. 14 capsule 0   amLODipine (NORVASC) 5 MG tablet Take 1 tablet (5 mg total) by mouth daily. (Patient not taking: Reported on 10/06/2021) 30 tablet 0   No current facility-administered medications for this visit.     ROS:  See HPI  Physical Exam:    Incision:  Well healed Extremities:  N/V/M intact Right TDC Clean and dry   Assessment/Plan:  This is a 61 y.o. male who is s/p:Left arm AV fistula branch ligation to assist with maturation.  The fistula was created 06/03/21.  I will schedule him to come back in 1-2 weeks for fistula duplex to determine the maturity of the fistula before allowing it to be used.  He has a working right  [place].      Roxy Horseman PA-C [facility] [phone]   Clinic MD:  Carlis Abbott

## 2021-11-03 ENCOUNTER — Ambulatory Visit (INDEPENDENT_AMBULATORY_CARE_PROVIDER_SITE_OTHER): Payer: Medicaid Other | Admitting: Physician Assistant

## 2021-11-03 VITALS — BP 111/75 | HR 81 | Temp 97.8°F | Resp 20 | Ht 69.0 in | Wt 291.2 lb

## 2021-11-03 DIAGNOSIS — N186 End stage renal disease: Secondary | ICD-10-CM

## 2021-11-04 DIAGNOSIS — Z992 Dependence on renal dialysis: Secondary | ICD-10-CM | POA: Diagnosis not present

## 2021-11-04 DIAGNOSIS — N2581 Secondary hyperparathyroidism of renal origin: Secondary | ICD-10-CM | POA: Diagnosis not present

## 2021-11-04 DIAGNOSIS — N185 Chronic kidney disease, stage 5: Secondary | ICD-10-CM | POA: Diagnosis not present

## 2021-11-04 DIAGNOSIS — N186 End stage renal disease: Secondary | ICD-10-CM | POA: Diagnosis not present

## 2021-11-04 DIAGNOSIS — D631 Anemia in chronic kidney disease: Secondary | ICD-10-CM | POA: Diagnosis not present

## 2021-11-04 DIAGNOSIS — T8249XA Other complication of vascular dialysis catheter, initial encounter: Secondary | ICD-10-CM | POA: Diagnosis not present

## 2021-11-04 DIAGNOSIS — D688 Other specified coagulation defects: Secondary | ICD-10-CM | POA: Diagnosis not present

## 2021-11-05 ENCOUNTER — Other Ambulatory Visit: Payer: Self-pay | Admitting: *Deleted

## 2021-11-05 DIAGNOSIS — N186 End stage renal disease: Secondary | ICD-10-CM

## 2021-11-06 DIAGNOSIS — T8249XA Other complication of vascular dialysis catheter, initial encounter: Secondary | ICD-10-CM | POA: Diagnosis not present

## 2021-11-06 DIAGNOSIS — D631 Anemia in chronic kidney disease: Secondary | ICD-10-CM | POA: Diagnosis not present

## 2021-11-06 DIAGNOSIS — N186 End stage renal disease: Secondary | ICD-10-CM | POA: Diagnosis not present

## 2021-11-06 DIAGNOSIS — N185 Chronic kidney disease, stage 5: Secondary | ICD-10-CM | POA: Diagnosis not present

## 2021-11-06 DIAGNOSIS — N2581 Secondary hyperparathyroidism of renal origin: Secondary | ICD-10-CM | POA: Diagnosis not present

## 2021-11-06 DIAGNOSIS — Z992 Dependence on renal dialysis: Secondary | ICD-10-CM | POA: Diagnosis not present

## 2021-11-06 DIAGNOSIS — D688 Other specified coagulation defects: Secondary | ICD-10-CM | POA: Diagnosis not present

## 2021-11-09 DIAGNOSIS — N2581 Secondary hyperparathyroidism of renal origin: Secondary | ICD-10-CM | POA: Diagnosis not present

## 2021-11-09 DIAGNOSIS — N186 End stage renal disease: Secondary | ICD-10-CM | POA: Diagnosis not present

## 2021-11-09 DIAGNOSIS — D688 Other specified coagulation defects: Secondary | ICD-10-CM | POA: Diagnosis not present

## 2021-11-09 DIAGNOSIS — Z992 Dependence on renal dialysis: Secondary | ICD-10-CM | POA: Diagnosis not present

## 2021-11-09 DIAGNOSIS — T8249XA Other complication of vascular dialysis catheter, initial encounter: Secondary | ICD-10-CM | POA: Diagnosis not present

## 2021-11-09 DIAGNOSIS — N185 Chronic kidney disease, stage 5: Secondary | ICD-10-CM | POA: Diagnosis not present

## 2021-11-11 DIAGNOSIS — N186 End stage renal disease: Secondary | ICD-10-CM | POA: Diagnosis not present

## 2021-11-11 DIAGNOSIS — N185 Chronic kidney disease, stage 5: Secondary | ICD-10-CM | POA: Diagnosis not present

## 2021-11-11 DIAGNOSIS — N2581 Secondary hyperparathyroidism of renal origin: Secondary | ICD-10-CM | POA: Diagnosis not present

## 2021-11-11 DIAGNOSIS — T8249XA Other complication of vascular dialysis catheter, initial encounter: Secondary | ICD-10-CM | POA: Diagnosis not present

## 2021-11-11 DIAGNOSIS — Z992 Dependence on renal dialysis: Secondary | ICD-10-CM | POA: Diagnosis not present

## 2021-11-11 DIAGNOSIS — D688 Other specified coagulation defects: Secondary | ICD-10-CM | POA: Diagnosis not present

## 2021-11-13 DIAGNOSIS — N2581 Secondary hyperparathyroidism of renal origin: Secondary | ICD-10-CM | POA: Diagnosis not present

## 2021-11-13 DIAGNOSIS — N186 End stage renal disease: Secondary | ICD-10-CM | POA: Diagnosis not present

## 2021-11-13 DIAGNOSIS — T8249XA Other complication of vascular dialysis catheter, initial encounter: Secondary | ICD-10-CM | POA: Diagnosis not present

## 2021-11-13 DIAGNOSIS — D688 Other specified coagulation defects: Secondary | ICD-10-CM | POA: Diagnosis not present

## 2021-11-13 DIAGNOSIS — Z992 Dependence on renal dialysis: Secondary | ICD-10-CM | POA: Diagnosis not present

## 2021-11-13 DIAGNOSIS — N185 Chronic kidney disease, stage 5: Secondary | ICD-10-CM | POA: Diagnosis not present

## 2021-11-16 DIAGNOSIS — D688 Other specified coagulation defects: Secondary | ICD-10-CM | POA: Diagnosis not present

## 2021-11-16 DIAGNOSIS — N186 End stage renal disease: Secondary | ICD-10-CM | POA: Diagnosis not present

## 2021-11-16 DIAGNOSIS — T8249XA Other complication of vascular dialysis catheter, initial encounter: Secondary | ICD-10-CM | POA: Diagnosis not present

## 2021-11-16 DIAGNOSIS — N2581 Secondary hyperparathyroidism of renal origin: Secondary | ICD-10-CM | POA: Diagnosis not present

## 2021-11-16 DIAGNOSIS — Z992 Dependence on renal dialysis: Secondary | ICD-10-CM | POA: Diagnosis not present

## 2021-11-16 DIAGNOSIS — N185 Chronic kidney disease, stage 5: Secondary | ICD-10-CM | POA: Diagnosis not present

## 2021-11-16 NOTE — Progress Notes (Unsigned)
Established Dialysis Access   History of Present Illness   Johnny [name] is a 61 y.o. ([birth year]) male who is s/p left radiocephalic AV fistula 1/61/09 and most recently branch ligation on 10/12/21 to assist with maturity of the fistula. He has been without signs or symptoms of steal. At his last visit his incisions were healing very well. He returns today for follow up with duplex prior to use of his fistula. He is not having any pain, numbness, weakness or coldness in forearm or hand. His incisions are healing well. He  reports that he" knocked" one of the scabs off the incision so one is still healing.  The pt is on dialysis via right IJ [facility] on  M/W/F at [place] location.  Current Outpatient Medications  Medication Sig Dispense Refill   amLODipine (NORVASC) 5 MG tablet Take 1 tablet (5 mg total) by mouth daily. 30 tablet 0   ascorbic acid (VITAMIN C) 500 MG tablet Take 1 tablet (500 mg total) by mouth daily. 30 tablet 0   calcitRIOL (ROCALTROL) 0.5 MCG capsule Take 2 capsules (1 mcg total) by mouth daily. 30 capsule 0   carvedilol (COREG) 12.5 MG tablet Take 1 tablet (12.5 mg total) by mouth 2 (two) times daily with a meal. 60 tablet 0   HYDROcodone-acetaminophen (NORCO) 5-325 MG tablet Take 1 tablet by mouth every 6 (six) hours as needed for moderate pain. 10 tablet 0   RENVELA 800 MG tablet Take 1,600 mg by mouth 3 (three) times daily.     VITAMIN D PO Take 1 tablet by mouth daily.     zinc sulfate 220 (50 Zn) MG capsule Take 1 capsule (220 mg total) by mouth daily. 14 capsule 0   No current facility-administered medications for this visit.    On ROS today: negative unless stated in HPI   Physical Examination   Vitals:   11/19/21 0912  BP: 112/77  Pulse: 79  Resp: 20  Temp: 97.8 F (36.6 C)  TempSrc: Temporal  SpO2: 100%  Weight: 288 lb 8 oz (130.9 kg)  Height: '5\' 9"'$  (1.753 m)   Body mass index is 42.6 kg/m.  General Well appearing, in no distress  Pulmonary Non  labored  Cardiac Regular rate and rhythm   Vascular Vessel Right Left  Radial Palpable Palpable  Brachial Palpable Palpable  Ulnar Not palpable Not palpable    Musculo- skeletal Left forearm fistula with good thrill, audible bruit. Incisions are healing well. Most proximal incision with small superficial eschar. No swelling. No drainage. No erythema M/S 5/5 throughout  , Extremities without ischemic changes    Neurologic A&O; CN grossly intact     Non-invasive Vascular Imaging   left Arm Access Duplex  (11/16/21):  Findings:  +--------------------+----------+-----------------+--------+  AVF                 PSV (cm/s)Flow Vol (mL/min)Comments  +--------------------+----------+-----------------+--------+  Native artery inflow   135          1054                 +--------------------+----------+-----------------+--------+  AVF Anastomosis        351                               +--------------------+----------+-----------------+--------+      +------------+----------+-------------+-----------+---------+  OUTFLOW VEINPSV (cm/s)Diameter (cm)Depth (cm) Describe   +------------+----------+-------------+-----------+---------+  AC Fossa  94        0.68        0.12               +------------+----------+-------------+-----------+---------+  Prox Forearm   124        0.56        0.33               +------------+----------+-------------+-----------+---------+  Mid Forearm 523 / 258  0.30 / 0.49 0.14 / 0.18narrowing  +------------+----------+-------------+-----------+---------+  Dist Forearm   237        0.32        0.20               +------------+----------+-------------+-----------+---------+     Summary:  Patent arteriovenous fistula with increased velocity at area of narrowing in the mid to distal forearm.   Medical Decision Making   Johnny [name] is a 61 y.o. male who presents s/p left radiocephalic AV fistula 5/53/74 and most  recently branch ligation on 10/12/21 to assist with maturity of the fistula. His fistula is functioning well. Duplex shows adequate flow volume. There is some narrowing noted in mid forearm. On exam fistula has excellent thrill. His incisions are healing well. The most proximal incision near [facility] is still healing with small eschar. No signs of infection.  He has no signs or symptoms of steal His fistula can be accessed as of 11/23/21, however his most proximal incision is still healing and he would like to wait until this is fully healed before allowing cannulation The patient's tunneled dialysis catheter can be removed when Nephrology is comfortable with the performance of the right IJ [facility] The patient may follow up on a prn basis   Karoline Caldwell, PA-C [facility] [place]: [phone]  Clinic MD: Scot Dock

## 2021-11-18 DIAGNOSIS — Z992 Dependence on renal dialysis: Secondary | ICD-10-CM | POA: Diagnosis not present

## 2021-11-18 DIAGNOSIS — N2581 Secondary hyperparathyroidism of renal origin: Secondary | ICD-10-CM | POA: Diagnosis not present

## 2021-11-18 DIAGNOSIS — D688 Other specified coagulation defects: Secondary | ICD-10-CM | POA: Diagnosis not present

## 2021-11-18 DIAGNOSIS — N186 End stage renal disease: Secondary | ICD-10-CM | POA: Diagnosis not present

## 2021-11-18 DIAGNOSIS — N185 Chronic kidney disease, stage 5: Secondary | ICD-10-CM | POA: Diagnosis not present

## 2021-11-18 DIAGNOSIS — T8249XA Other complication of vascular dialysis catheter, initial encounter: Secondary | ICD-10-CM | POA: Diagnosis not present

## 2021-11-19 ENCOUNTER — Ambulatory Visit (HOSPITAL_COMMUNITY)
Admission: RE | Admit: 2021-11-19 | Discharge: 2021-11-19 | Disposition: A | Discharge status: Home / Self Care | Payer: Medicare Other | Source: Ambulatory Visit | Attending: Vascular Surgery | Admitting: Vascular Surgery

## 2021-11-19 ENCOUNTER — Ambulatory Visit: Payer: Medicaid Other | Admitting: Physician Assistant

## 2021-11-19 VITALS — BP 112/77 | HR 79 | Temp 97.8°F | Resp 20 | Ht 69.0 in | Wt 288.5 lb

## 2021-11-19 DIAGNOSIS — N186 End stage renal disease: Secondary | ICD-10-CM | POA: Insufficient documentation

## 2021-11-20 DIAGNOSIS — N186 End stage renal disease: Secondary | ICD-10-CM | POA: Diagnosis not present

## 2021-11-20 DIAGNOSIS — T8249XA Other complication of vascular dialysis catheter, initial encounter: Secondary | ICD-10-CM | POA: Diagnosis not present

## 2021-11-20 DIAGNOSIS — D688 Other specified coagulation defects: Secondary | ICD-10-CM | POA: Diagnosis not present

## 2021-11-20 DIAGNOSIS — N2581 Secondary hyperparathyroidism of renal origin: Secondary | ICD-10-CM | POA: Diagnosis not present

## 2021-11-20 DIAGNOSIS — Z992 Dependence on renal dialysis: Secondary | ICD-10-CM | POA: Diagnosis not present

## 2021-11-20 DIAGNOSIS — N185 Chronic kidney disease, stage 5: Secondary | ICD-10-CM | POA: Diagnosis not present

## 2021-11-21 DIAGNOSIS — N186 End stage renal disease: Secondary | ICD-10-CM | POA: Diagnosis not present

## 2021-11-21 DIAGNOSIS — I120 Hypertensive chronic kidney disease with stage 5 chronic kidney disease or end stage renal disease: Secondary | ICD-10-CM | POA: Diagnosis not present

## 2021-11-21 DIAGNOSIS — Z992 Dependence on renal dialysis: Secondary | ICD-10-CM | POA: Diagnosis not present

## 2021-11-23 DIAGNOSIS — T8249XA Other complication of vascular dialysis catheter, initial encounter: Secondary | ICD-10-CM | POA: Diagnosis not present

## 2021-11-23 DIAGNOSIS — N186 End stage renal disease: Secondary | ICD-10-CM | POA: Diagnosis not present

## 2021-11-23 DIAGNOSIS — D688 Other specified coagulation defects: Secondary | ICD-10-CM | POA: Diagnosis not present

## 2021-11-23 DIAGNOSIS — N2581 Secondary hyperparathyroidism of renal origin: Secondary | ICD-10-CM | POA: Diagnosis not present

## 2021-11-23 DIAGNOSIS — Z992 Dependence on renal dialysis: Secondary | ICD-10-CM | POA: Diagnosis not present

## 2021-11-23 DIAGNOSIS — N185 Chronic kidney disease, stage 5: Secondary | ICD-10-CM | POA: Diagnosis not present

## 2021-11-24 DIAGNOSIS — H2589 Other age-related cataract: Secondary | ICD-10-CM | POA: Diagnosis not present

## 2021-11-25 DIAGNOSIS — N185 Chronic kidney disease, stage 5: Secondary | ICD-10-CM | POA: Diagnosis not present

## 2021-11-25 DIAGNOSIS — T8249XA Other complication of vascular dialysis catheter, initial encounter: Secondary | ICD-10-CM | POA: Diagnosis not present

## 2021-11-25 DIAGNOSIS — Z992 Dependence on renal dialysis: Secondary | ICD-10-CM | POA: Diagnosis not present

## 2021-11-25 DIAGNOSIS — N186 End stage renal disease: Secondary | ICD-10-CM | POA: Diagnosis not present

## 2021-11-25 DIAGNOSIS — N2581 Secondary hyperparathyroidism of renal origin: Secondary | ICD-10-CM | POA: Diagnosis not present

## 2021-11-25 DIAGNOSIS — D688 Other specified coagulation defects: Secondary | ICD-10-CM | POA: Diagnosis not present

## 2021-11-27 DIAGNOSIS — N185 Chronic kidney disease, stage 5: Secondary | ICD-10-CM | POA: Diagnosis not present

## 2021-11-27 DIAGNOSIS — N2581 Secondary hyperparathyroidism of renal origin: Secondary | ICD-10-CM | POA: Diagnosis not present

## 2021-11-27 DIAGNOSIS — D688 Other specified coagulation defects: Secondary | ICD-10-CM | POA: Diagnosis not present

## 2021-11-27 DIAGNOSIS — N186 End stage renal disease: Secondary | ICD-10-CM | POA: Diagnosis not present

## 2021-11-27 DIAGNOSIS — Z992 Dependence on renal dialysis: Secondary | ICD-10-CM | POA: Diagnosis not present

## 2021-11-27 DIAGNOSIS — T8249XA Other complication of vascular dialysis catheter, initial encounter: Secondary | ICD-10-CM | POA: Diagnosis not present

## 2021-11-30 DIAGNOSIS — N2581 Secondary hyperparathyroidism of renal origin: Secondary | ICD-10-CM | POA: Diagnosis not present

## 2021-11-30 DIAGNOSIS — N186 End stage renal disease: Secondary | ICD-10-CM | POA: Diagnosis not present

## 2021-11-30 DIAGNOSIS — Z23 Encounter for immunization: Secondary | ICD-10-CM | POA: Diagnosis not present

## 2021-11-30 DIAGNOSIS — Z992 Dependence on renal dialysis: Secondary | ICD-10-CM | POA: Diagnosis not present

## 2021-11-30 DIAGNOSIS — D631 Anemia in chronic kidney disease: Secondary | ICD-10-CM | POA: Diagnosis not present

## 2021-11-30 DIAGNOSIS — T8249XA Other complication of vascular dialysis catheter, initial encounter: Secondary | ICD-10-CM | POA: Diagnosis not present

## 2021-11-30 DIAGNOSIS — D688 Other specified coagulation defects: Secondary | ICD-10-CM | POA: Diagnosis not present

## 2021-11-30 DIAGNOSIS — N185 Chronic kidney disease, stage 5: Secondary | ICD-10-CM | POA: Diagnosis not present

## 2021-12-02 DIAGNOSIS — N2581 Secondary hyperparathyroidism of renal origin: Secondary | ICD-10-CM | POA: Diagnosis not present

## 2021-12-02 DIAGNOSIS — D688 Other specified coagulation defects: Secondary | ICD-10-CM | POA: Diagnosis not present

## 2021-12-02 DIAGNOSIS — N185 Chronic kidney disease, stage 5: Secondary | ICD-10-CM | POA: Diagnosis not present

## 2021-12-02 DIAGNOSIS — T8249XA Other complication of vascular dialysis catheter, initial encounter: Secondary | ICD-10-CM | POA: Diagnosis not present

## 2021-12-02 DIAGNOSIS — Z23 Encounter for immunization: Secondary | ICD-10-CM | POA: Diagnosis not present

## 2021-12-02 DIAGNOSIS — D631 Anemia in chronic kidney disease: Secondary | ICD-10-CM | POA: Diagnosis not present

## 2021-12-02 DIAGNOSIS — Z992 Dependence on renal dialysis: Secondary | ICD-10-CM | POA: Diagnosis not present

## 2021-12-02 DIAGNOSIS — N186 End stage renal disease: Secondary | ICD-10-CM | POA: Diagnosis not present

## 2021-12-04 DIAGNOSIS — T8249XA Other complication of vascular dialysis catheter, initial encounter: Secondary | ICD-10-CM | POA: Diagnosis not present

## 2021-12-04 DIAGNOSIS — D631 Anemia in chronic kidney disease: Secondary | ICD-10-CM | POA: Diagnosis not present

## 2021-12-04 DIAGNOSIS — Z992 Dependence on renal dialysis: Secondary | ICD-10-CM | POA: Diagnosis not present

## 2021-12-04 DIAGNOSIS — D688 Other specified coagulation defects: Secondary | ICD-10-CM | POA: Diagnosis not present

## 2021-12-04 DIAGNOSIS — N2581 Secondary hyperparathyroidism of renal origin: Secondary | ICD-10-CM | POA: Diagnosis not present

## 2021-12-04 DIAGNOSIS — Z23 Encounter for immunization: Secondary | ICD-10-CM | POA: Diagnosis not present

## 2021-12-04 DIAGNOSIS — N186 End stage renal disease: Secondary | ICD-10-CM | POA: Diagnosis not present

## 2021-12-04 DIAGNOSIS — N185 Chronic kidney disease, stage 5: Secondary | ICD-10-CM | POA: Diagnosis not present

## 2021-12-07 DIAGNOSIS — Z992 Dependence on renal dialysis: Secondary | ICD-10-CM | POA: Diagnosis not present

## 2021-12-07 DIAGNOSIS — D688 Other specified coagulation defects: Secondary | ICD-10-CM | POA: Diagnosis not present

## 2021-12-07 DIAGNOSIS — N186 End stage renal disease: Secondary | ICD-10-CM | POA: Diagnosis not present

## 2021-12-07 DIAGNOSIS — N185 Chronic kidney disease, stage 5: Secondary | ICD-10-CM | POA: Diagnosis not present

## 2021-12-07 DIAGNOSIS — T8249XA Other complication of vascular dialysis catheter, initial encounter: Secondary | ICD-10-CM | POA: Diagnosis not present

## 2021-12-07 DIAGNOSIS — N2581 Secondary hyperparathyroidism of renal origin: Secondary | ICD-10-CM | POA: Diagnosis not present

## 2021-12-09 DIAGNOSIS — D688 Other specified coagulation defects: Secondary | ICD-10-CM | POA: Diagnosis not present

## 2021-12-09 DIAGNOSIS — N2581 Secondary hyperparathyroidism of renal origin: Secondary | ICD-10-CM | POA: Diagnosis not present

## 2021-12-09 DIAGNOSIS — Z992 Dependence on renal dialysis: Secondary | ICD-10-CM | POA: Diagnosis not present

## 2021-12-09 DIAGNOSIS — N186 End stage renal disease: Secondary | ICD-10-CM | POA: Diagnosis not present

## 2021-12-09 DIAGNOSIS — N185 Chronic kidney disease, stage 5: Secondary | ICD-10-CM | POA: Diagnosis not present

## 2021-12-09 DIAGNOSIS — T8249XA Other complication of vascular dialysis catheter, initial encounter: Secondary | ICD-10-CM | POA: Diagnosis not present

## 2021-12-11 DIAGNOSIS — T8249XA Other complication of vascular dialysis catheter, initial encounter: Secondary | ICD-10-CM | POA: Diagnosis not present

## 2021-12-11 DIAGNOSIS — D688 Other specified coagulation defects: Secondary | ICD-10-CM | POA: Diagnosis not present

## 2021-12-11 DIAGNOSIS — Z992 Dependence on renal dialysis: Secondary | ICD-10-CM | POA: Diagnosis not present

## 2021-12-11 DIAGNOSIS — N186 End stage renal disease: Secondary | ICD-10-CM | POA: Diagnosis not present

## 2021-12-11 DIAGNOSIS — N185 Chronic kidney disease, stage 5: Secondary | ICD-10-CM | POA: Diagnosis not present

## 2021-12-11 DIAGNOSIS — N2581 Secondary hyperparathyroidism of renal origin: Secondary | ICD-10-CM | POA: Diagnosis not present

## 2022-02-03 ENCOUNTER — Other Ambulatory Visit: Payer: Self-pay

## 2022-02-03 DIAGNOSIS — N186 End stage renal disease: Secondary | ICD-10-CM

## 2022-02-04 ENCOUNTER — Ambulatory Visit (HOSPITAL_COMMUNITY)
Admission: RE | Admit: 2022-02-04 | Discharge: 2022-02-04 | Disposition: A | Discharge status: Home / Self Care | Payer: Medicare Other | Source: Ambulatory Visit | Attending: Vascular Surgery | Admitting: Vascular Surgery

## 2022-02-04 ENCOUNTER — Ambulatory Visit (INDEPENDENT_AMBULATORY_CARE_PROVIDER_SITE_OTHER)
Admission: RE | Admit: 2022-02-04 | Discharge: 2022-02-04 | Disposition: A | Discharge status: Home / Self Care | Payer: Medicare Other | Source: Ambulatory Visit | Attending: Vascular Surgery | Admitting: Vascular Surgery

## 2022-02-04 DIAGNOSIS — N186 End stage renal disease: Secondary | ICD-10-CM | POA: Diagnosis not present

## 2022-02-04 NOTE — Progress Notes (Signed)
Established Dialysis Access   History of Present Illness   Johnny [name] is a right handed 61 y.o. (1961-01-19) male who is s/p left radiocephalic AV fistula 0/96/04 and most recently branch ligation on 10/12/21 to assist with maturity of the fistula.  He presents today after recent left radiocephalic fistula thrombosis.  He is currently being dialyzed through a tunneled dialysis catheter in the right chest Monday Wednesday Friday.  On exam, Johnny [name] was doing well with no complaints. Johnny [name] was once 400 pounds, and has been working diligently to lose weight.  He is currently 288 pounds.  He would like the tunneled dialysis catheter out as soon as possible so that he can return to the gym and continue losing weight.  He denies sensorimotor issues in the left hand.   Current Outpatient Medications  Medication Sig Dispense Refill   amLODipine (NORVASC) 5 MG tablet Take 1 tablet (5 mg total) by mouth daily. 30 tablet 0   ascorbic acid (VITAMIN C) 500 MG tablet Take 1 tablet (500 mg total) by mouth daily. 30 tablet 0   calcitRIOL (ROCALTROL) 0.5 MCG capsule Take 2 capsules (1 mcg total) by mouth daily. 30 capsule 0   carvedilol (COREG) 12.5 MG tablet Take 1 tablet (12.5 mg total) by mouth 2 (two) times daily with a meal. 60 tablet 0   HYDROcodone-acetaminophen (NORCO) 5-325 MG tablet Take 1 tablet by mouth every 6 (six) hours as needed for moderate pain. 10 tablet 0   RENVELA 800 MG tablet Take 1,600 mg by mouth 3 (three) times daily.     VITAMIN D PO Take 1 tablet by mouth daily.     zinc sulfate 220 (50 Zn) MG capsule Take 1 capsule (220 mg total) by mouth daily. 14 capsule 0   No current facility-administered medications for this visit.    On ROS today: negative unless stated in HPI   Physical Examination   There were no vitals filed for this visit.  There is no height or weight on file to calculate BMI.  General Well appearing, in no distress  Pulmonary Non labored  Cardiac  Regular rate and rhythm   Vascular Vessel Right Left  Radial Palpable Palpable  Brachial Palpable Palpable  Ulnar Not palpable Not palpable    Musculo- skeletal Left forearm fistula with good thrill, audible bruit. Incisions are healing well. Most proximal incision with small superficial eschar. No swelling. No drainage. No erythema M/S 5/5 throughout  , Extremities without ischemic changes    Neurologic A&O; CN grossly intact     Non-invasive Vascular Imaging   left Arm Access Duplex  (11/16/21):  +-----------------+-------------+----------+---------+  Right Cephalic   Diameter (cm)Depth (cm)Findings   +-----------------+-------------+----------+---------+  Shoulder            0.51                          +-----------------+-------------+----------+---------+  Prox upper arm       0.48                          +-----------------+-------------+----------+---------+  Mid upper arm        0.46                          +-----------------+-------------+----------+---------+  Dist upper arm       0.54                          +-----------------+-------------+----------+---------+  Antecubital fossa    0.62                          +-----------------+-------------+----------+---------+  Prox forearm         0.29                          +-----------------+-------------+----------+---------+  Mid forearm          0.10               branching  +-----------------+-------------+----------+---------+  Dist forearm         0.10               branching  +-----------------+-------------+----------+---------+  Summary:  Patent arteriovenous fistula with increased velocity at area of narrowing in the mid to distal forearm.   Medical Decision Making   Johnny [name] is a 61 y.o. male who presents s/p left radiocephalic AV fistula 10/21/92 and most recently branch ligation on 10/12/21 to assist with maturity of the fistula.    His fistula was working  until recent infiltration resulted in thrombosis.  Unfortunately, he now requires new HD access.  Vein mapping was evaluated, and demonstrated sufficient right-sided cephalic and basilic veins, left-sided basilic vein for arteriovenous fistula creation.    At this time, Johnny [name] is unsure as to whether he wants to pursue right-sided brachiocephalic fistula creation, which is a single operation, versus a left-sided brachiobasilic fistula creation which requires 2 surgeries as it will need to be transposed.  He would like some time to weigh whether he would like permanent HD access in his dominant arm versus a staged procedure as this will require a longer period of time with his tunneled dialysis catheter.  Asked him to call my office once he has made his decision.  I am happy to schedule him.   Johnny John MD Total time of patient care including pre-visit research, consultation, and documentation greater than 20 minutes

## 2022-02-05 ENCOUNTER — Encounter: Payer: Self-pay | Admitting: Vascular Surgery

## 2022-02-05 ENCOUNTER — Ambulatory Visit (INDEPENDENT_AMBULATORY_CARE_PROVIDER_SITE_OTHER): Payer: Medicare Other | Admitting: Vascular Surgery

## 2022-02-05 VITALS — BP 109/73 | HR 84 | Temp 98.4°F | Resp 20 | Ht 69.0 in | Wt 288.0 lb

## 2022-02-05 DIAGNOSIS — Z992 Dependence on renal dialysis: Secondary | ICD-10-CM

## 2022-02-05 DIAGNOSIS — N186 End stage renal disease: Secondary | ICD-10-CM

## 2022-02-09 ENCOUNTER — Other Ambulatory Visit: Payer: Self-pay

## 2022-02-09 ENCOUNTER — Telehealth: Payer: Self-pay

## 2022-02-09 DIAGNOSIS — N186 End stage renal disease: Secondary | ICD-10-CM

## 2022-02-09 NOTE — Telephone Encounter (Signed)
Pt called to confirm laterality for his upcoming AVF surgery. He stated he wants left sided AVF, and is aware this is a 2 part surgery. Pt has been scheduled and given pre operative instructions. No further questions/concerns at this time.

## 2022-02-24 ENCOUNTER — Encounter (HOSPITAL_COMMUNITY): Payer: Self-pay | Admitting: Vascular Surgery

## 2022-02-24 NOTE — Progress Notes (Signed)
PCP - Gordon  Cardiologist - none  Chest x-ray - 08/2021 EKG - 08/2021 Stress Test - n/a ECHO - 05/2021 Cardiac Cath - n/a  ICD Pacemaker/Loop - n/a  Sleep Study -  Yes CPAP - does not use CPAP  Anesthesia review: Yes   Patient was instructed to stop taking amlodipine and coreg medications per his MD due to his weight loss.     STOP now taking any Aspirin (unless otherwise instructed by your surgeon), Aleve, Naproxen, Ibuprofen, Motrin, Advil, Goody's, BC's, all herbal medications, fish oil, and all vitamins.   Coronavirus Screening Do you have any of the following symptoms:  Cough yes/no: No Fever (>100.42F)  yes/no: No Runny nose yes/no: No Sore throat yes/no: No Difficulty breathing/shortness of breath  yes/no: No  Have you traveled in the last 14 days and where? yes/no: No  Patient verbalized understanding of instructions that were given via phone.

## 2022-02-25 ENCOUNTER — Ambulatory Visit (HOSPITAL_COMMUNITY): Payer: Medicare Other | Admitting: Certified Registered"

## 2022-02-25 ENCOUNTER — Ambulatory Visit (HOSPITAL_COMMUNITY)
Admission: RE | Admit: 2022-02-25 | Discharge: 2022-02-25 | Disposition: A | Discharge status: Home / Self Care | Payer: Medicare Other | Attending: Vascular Surgery | Admitting: Vascular Surgery

## 2022-02-25 ENCOUNTER — Other Ambulatory Visit: Payer: Self-pay

## 2022-02-25 ENCOUNTER — Ambulatory Visit (HOSPITAL_BASED_OUTPATIENT_CLINIC_OR_DEPARTMENT_OTHER): Payer: Medicare Other | Admitting: Certified Registered"

## 2022-02-25 ENCOUNTER — Encounter (HOSPITAL_COMMUNITY)
Admission: RE | Disposition: A | Discharge status: Home / Self Care | Payer: Self-pay | Source: Home / Self Care | Attending: Vascular Surgery

## 2022-02-25 ENCOUNTER — Encounter (HOSPITAL_COMMUNITY): Payer: Self-pay | Admitting: Vascular Surgery

## 2022-02-25 DIAGNOSIS — I132 Hypertensive heart and chronic kidney disease with heart failure and with stage 5 chronic kidney disease, or end stage renal disease: Secondary | ICD-10-CM

## 2022-02-25 DIAGNOSIS — Z992 Dependence on renal dialysis: Secondary | ICD-10-CM | POA: Diagnosis not present

## 2022-02-25 DIAGNOSIS — N186 End stage renal disease: Secondary | ICD-10-CM | POA: Insufficient documentation

## 2022-02-25 DIAGNOSIS — D631 Anemia in chronic kidney disease: Secondary | ICD-10-CM

## 2022-02-25 DIAGNOSIS — I509 Heart failure, unspecified: Secondary | ICD-10-CM

## 2022-02-25 DIAGNOSIS — G4730 Sleep apnea, unspecified: Secondary | ICD-10-CM | POA: Diagnosis not present

## 2022-02-25 DIAGNOSIS — N185 Chronic kidney disease, stage 5: Secondary | ICD-10-CM | POA: Diagnosis not present

## 2022-02-25 HISTORY — PX: AV FISTULA PLACEMENT: SHX1204

## 2022-02-25 LAB — POCT I-STAT, CHEM 8
BUN: 37 mg/dL — ABNORMAL HIGH (ref 6–20)
Calcium, Ion: 1.12 mmol/L — ABNORMAL LOW (ref 1.15–1.40)
Chloride: 98 mmol/L (ref 98–111)
Creatinine, Ser: 11.4 mg/dL — ABNORMAL HIGH (ref 0.61–1.24)
Glucose, Bld: 103 mg/dL — ABNORMAL HIGH (ref 70–99)
HCT: 38 % — ABNORMAL LOW (ref 39.0–52.0)
Hemoglobin: 12.9 g/dL — ABNORMAL LOW (ref 13.0–17.0)
Potassium: 4.7 mmol/L (ref 3.5–5.1)
Sodium: 137 mmol/L (ref 135–145)
TCO2: 31 mmol/L (ref 22–32)

## 2022-02-25 SURGERY — ARTERIOVENOUS (AV) FISTULA CREATION
Anesthesia: Regional | Site: Arm Upper | Laterality: Left

## 2022-02-25 MED ORDER — 0.9 % SODIUM CHLORIDE (POUR BTL) OPTIME
TOPICAL | Status: DC | PRN
  Administered 2022-02-25 (×2): 1000 mL

## 2022-02-25 MED ORDER — LIDOCAINE HCL (PF) 1 % IJ SOLN
INTRAMUSCULAR | Status: AC
  Filled 2022-02-25: qty 30

## 2022-02-25 MED ORDER — SODIUM CHLORIDE 0.9 % IV SOLN: INTRAVENOUS | Status: DC

## 2022-02-25 MED ORDER — CEFAZOLIN IN SODIUM CHLORIDE 3-0.9 GM/100ML-% IV SOLN
3.0000 g | INTRAVENOUS | Status: DC
  Filled 2022-02-25: qty 100

## 2022-02-25 MED ORDER — PHENYLEPHRINE 80 MCG/ML (10ML) SYRINGE FOR IV PUSH (FOR BLOOD PRESSURE SUPPORT)
PREFILLED_SYRINGE | INTRAVENOUS | Status: DC | PRN
  Administered 2022-02-25: 80 ug via INTRAVENOUS
  Administered 2022-02-25 (×2): 160 ug via INTRAVENOUS

## 2022-02-25 MED ORDER — DEXAMETHASONE SODIUM PHOSPHATE 10 MG/ML IJ SOLN
INTRAMUSCULAR | Status: DC | PRN
  Administered 2022-02-25: 10 mg via INTRAVENOUS

## 2022-02-25 MED ORDER — HYDROCODONE-ACETAMINOPHEN 5-325 MG PO TABS: 1.0000 | ORAL_TABLET | ORAL | 0 refills | Status: DC | PRN

## 2022-02-25 MED ORDER — CHLORHEXIDINE GLUCONATE 4 % EX LIQD: 60.0000 mL | Freq: Once | CUTANEOUS | Status: DC

## 2022-02-25 MED ORDER — HEPARIN 6000 UNIT IRRIGATION SOLUTION
Status: DC | PRN
  Administered 2022-02-25: 1

## 2022-02-25 MED ORDER — ORAL CARE MOUTH RINSE: 15.0000 mL | Freq: Once | OROMUCOSAL | Status: AC

## 2022-02-25 MED ORDER — CHLORHEXIDINE GLUCONATE 0.12 % MT SOLN
15.0000 mL | Freq: Once | OROMUCOSAL | Status: AC
  Administered 2022-02-25: 15 mL via OROMUCOSAL
  Filled 2022-02-25: qty 15

## 2022-02-25 MED ORDER — DEXTROSE 5 % IV SOLN
INTRAVENOUS | Status: DC | PRN
  Administered 2022-02-25: 3 g via INTRAVENOUS

## 2022-02-25 MED ORDER — SODIUM CHLORIDE 0.9 % IV SOLN: INTRAVENOUS | Status: DC | PRN

## 2022-02-25 MED ORDER — PAPAVERINE HCL 30 MG/ML IJ SOLN
INTRAMUSCULAR | Status: AC
  Filled 2022-02-25: qty 2

## 2022-02-25 MED ORDER — FENTANYL CITRATE (PF) 250 MCG/5ML IJ SOLN
INTRAMUSCULAR | Status: DC | PRN
  Administered 2022-02-25: 50 ug via INTRAVENOUS

## 2022-02-25 MED ORDER — ONDANSETRON HCL 4 MG/2ML IJ SOLN
INTRAMUSCULAR | Status: DC | PRN
  Administered 2022-02-25: 4 mg via INTRAVENOUS

## 2022-02-25 MED ORDER — HEPARIN SODIUM (PORCINE) 1000 UNIT/ML IJ SOLN
INTRAMUSCULAR | Status: DC | PRN
  Administered 2022-02-25: 2000 [IU] via INTRAVENOUS

## 2022-02-25 MED ORDER — PROPOFOL 500 MG/50ML IV EMUL
INTRAVENOUS | Status: DC | PRN
  Administered 2022-02-25: 100 ug/kg/min via INTRAVENOUS
  Administered 2022-02-25: 25 mg via INTRAVENOUS

## 2022-02-25 MED ORDER — HEPARIN 6000 UNIT IRRIGATION SOLUTION
Status: AC
  Filled 2022-02-25: qty 500

## 2022-02-25 MED ORDER — DEXAMETHASONE SODIUM PHOSPHATE 10 MG/ML IJ SOLN
INTRAMUSCULAR | Status: AC
  Filled 2022-02-25: qty 1

## 2022-02-25 MED ORDER — MIDAZOLAM HCL 2 MG/2ML IJ SOLN
INTRAMUSCULAR | Status: DC | PRN
  Administered 2022-02-25: 2 mg via INTRAVENOUS

## 2022-02-25 MED ORDER — ONDANSETRON HCL 4 MG/2ML IJ SOLN
INTRAMUSCULAR | Status: AC
  Filled 2022-02-25: qty 2

## 2022-02-25 MED ORDER — PHENYLEPHRINE HCL-NACL 20-0.9 MG/250ML-% IV SOLN
INTRAVENOUS | Status: DC | PRN
  Administered 2022-02-25: 30 ug/min via INTRAVENOUS

## 2022-02-25 MED ORDER — MIDAZOLAM HCL 2 MG/2ML IJ SOLN
INTRAMUSCULAR | Status: AC
  Filled 2022-02-25: qty 2

## 2022-02-25 MED ORDER — HEPARIN SODIUM (PORCINE) 1000 UNIT/ML IJ SOLN
INTRAMUSCULAR | Status: AC
  Filled 2022-02-25: qty 10

## 2022-02-25 MED ORDER — FENTANYL CITRATE (PF) 250 MCG/5ML IJ SOLN
INTRAMUSCULAR | Status: AC
  Filled 2022-02-25: qty 5

## 2022-02-25 SURGICAL SUPPLY — 41 items
ADH SKN CLS APL DERMABOND .7 (GAUZE/BANDAGES/DRESSINGS) ×1
ARMBAND PINK RESTRICT EXTREMIT (MISCELLANEOUS) ×2 IMPLANT
BAG COUNTER SPONGE SURGICOUNT (BAG) ×2 IMPLANT
BAG SPNG CNTER NS LX DISP (BAG) ×1
BLADE CLIPPER SURG (BLADE) ×2 IMPLANT
BNDG ELASTIC 4X5.8 VLCR STR LF (GAUZE/BANDAGES/DRESSINGS) ×2 IMPLANT
CANISTER SUCT 3000ML PPV (MISCELLANEOUS) ×2 IMPLANT
CLIP LIGATING EXTRA MED SLVR (CLIP) ×2 IMPLANT
CLIP LIGATING EXTRA SM BLUE (MISCELLANEOUS) ×2 IMPLANT
CLIP VESOCCLUDE MED 6/CT (CLIP) ×2 IMPLANT
COVER PROBE W GEL 5X96 (DRAPES) ×2 IMPLANT
DERMABOND ADVANCED .7 DNX12 (GAUZE/BANDAGES/DRESSINGS) ×2 IMPLANT
ELECT REM PT RETURN 9FT ADLT (ELECTROSURGICAL) ×1
ELECTRODE REM PT RTRN 9FT ADLT (ELECTROSURGICAL) ×2 IMPLANT
GLOVE BIO SURGEON STRL SZ 6.5 (GLOVE) IMPLANT
GLOVE BIO SURGEON STRL SZ7.5 (GLOVE) ×2 IMPLANT
GLOVE BIOGEL PI IND STRL 8 (GLOVE) ×2 IMPLANT
GLOVE SRG 8 PF TXTR STRL LF DI (GLOVE) ×2 IMPLANT
GLOVE SURG UNDER POLY LF SZ8 (GLOVE) ×1
GOWN STRL REUS W/ TWL LRG LVL3 (GOWN DISPOSABLE) ×4 IMPLANT
GOWN STRL REUS W/TWL 2XL LVL3 (GOWN DISPOSABLE) ×4 IMPLANT
GOWN STRL REUS W/TWL LRG LVL3 (GOWN DISPOSABLE) ×2
KIT BASIN OR (CUSTOM PROCEDURE TRAY) ×2 IMPLANT
KIT TURNOVER KIT B (KITS) ×2 IMPLANT
LOOP VESSEL MINI RED (MISCELLANEOUS) IMPLANT
NS IRRIG 1000ML POUR BTL (IV SOLUTION) ×2 IMPLANT
PACK CV ACCESS (CUSTOM PROCEDURE TRAY) ×2 IMPLANT
PAD ARMBOARD 7.5X6 YLW CONV (MISCELLANEOUS) ×4 IMPLANT
SLING ARM FOAM STRAP LRG (SOFTGOODS) IMPLANT
SLING ARM FOAM STRAP MED (SOFTGOODS) IMPLANT
SPIKE FLUID TRANSFER (MISCELLANEOUS) ×2 IMPLANT
SUT MNCRL AB 4-0 PS2 18 (SUTURE) ×2 IMPLANT
SUT PROLENE 6 0 BV (SUTURE) ×2 IMPLANT
SUT PROLENE 7 0 BV 1 (SUTURE) IMPLANT
SUT SILK 2 0 SH (SUTURE) ×2 IMPLANT
SUT SILK 3 0 SH CR/8 (SUTURE) ×2 IMPLANT
SUT VIC AB 3-0 SH 27 (SUTURE) ×1
SUT VIC AB 3-0 SH 27X BRD (SUTURE) ×2 IMPLANT
TOWEL GREEN STERILE (TOWEL DISPOSABLE) ×2 IMPLANT
UNDERPAD 30X36 HEAVY ABSORB (UNDERPADS AND DIAPERS) ×2 IMPLANT
WATER STERILE IRR 1000ML POUR (IV SOLUTION) ×2 IMPLANT

## 2022-02-25 NOTE — H&P (Signed)
Patient seen and examined in preop holding.  No complaints. No changes to medication history or physical exam since last seen in clinic. After discussing the risks and benefits of left arm 1st stage brachiobasilic fistula v graft, Johnny Johnny [name] elected to proceed.   Johnny Johnny [name]   Established Dialysis Access   History of Present Illness   Johnny Johnny [name] a right handed 62 y.o. (Sep 20, 1960) male who Johnny [name] s/p left radiocephalic AV fistula 10/25/38 and most recently branch ligation on 10/12/21 to assist with maturity of the fistula.  He presents today after recent left radiocephalic fistula thrombosis.  He Johnny [name] currently being dialyzed through a tunneled dialysis catheter in the right chest Monday Wednesday Friday.  On exam, Johnny Johnny [name] was doing well with no complaints. Johnny Johnny [name] was once 400 pounds, and has been working diligently to lose weight.  He Johnny [name] currently 288 pounds.  He would like the tunneled dialysis catheter out as soon as possible so that he can return to the gym and continue losing weight.  He denies sensorimotor issues in the left hand.   Current Facility-Administered Medications  Medication Dose Route Frequency Provider Last Rate Last Admin   0.9 %  sodium chloride infusion   Intravenous Continuous Johnny John, [name]       0.9 %  sodium chloride infusion   Intravenous Continuous Johnny Johnny [name]       ceFAZolin (ANCEF) IVPB 3g/100 mL premix  3 g Intravenous 30 min Pre-Op Johnny John, [name]       chlorhexidine (HIBICLENS) 4 % liquid 4 Application  60 mL Topical Once Johnny John, [name]       And   [START ON 02/26/2022] chlorhexidine (HIBICLENS) 4 % liquid 4 Application  60 mL Topical Once Johnny John, [name]        On ROS today: negative unless stated in HPI   Physical Examination   Vitals:   02/24/22 1438 02/25/22 0614  BP:  103/67  Pulse:  80  Resp:  18  Temp:  98.7 F (37.1 C)  TempSrc:  Oral  SpO2:  95%  Weight: 132.5 kg 124.7 kg  Height: '5\' 9"'$   (1.753 m) '5\' 9"'$  (1.753 m)    Body mass index Johnny [name] 40.61 kg/m.  General Well appearing, in no distress  Pulmonary Non labored  Cardiac Regular rate and rhythm   Vascular Vessel Right Left  Radial Palpable Palpable  Brachial Palpable Palpable  Ulnar Not palpable Not palpable    Musculo- skeletal Left forearm fistula with good thrill, audible bruit. Incisions are healing well. Most proximal incision with small superficial eschar. No swelling. No drainage. No erythema M/S 5/5 throughout  , Extremities without ischemic changes    Neurologic A&O; CN grossly intact     Non-invasive Vascular Imaging   left Arm Access Duplex  (11/16/21):  +-----------------+-------------+----------+---------+  Right Cephalic   Diameter (cm)Depth (cm)Findings   +-----------------+-------------+----------+---------+  Shoulder            0.51                          +-----------------+-------------+----------+---------+  Prox upper arm       0.48                          +-----------------+-------------+----------+---------+  Mid upper arm        0.46                          +-----------------+-------------+----------+---------+  Dist upper arm       0.54                          +-----------------+-------------+----------+---------+  Antecubital fossa    0.62                          +-----------------+-------------+----------+---------+  Prox forearm         0.29                          +-----------------+-------------+----------+---------+  Mid forearm          0.10               branching  +-----------------+-------------+----------+---------+  Dist forearm         0.10               branching  +-----------------+-------------+----------+---------+  Summary:  Patent arteriovenous fistula with increased velocity at area of narrowing in the mid to distal forearm.   Medical Decision Making   Johnny Johnny [name] a 62 y.o. male who presents s/p left  radiocephalic AV fistula 09/17/18 and most recently branch ligation on 10/12/21 to assist with maturity of the fistula.    His fistula was working until recent infiltration resulted in thrombosis.  Unfortunately, he now requires new HD access.  Vein mapping was evaluated, and demonstrated sufficient right-sided cephalic and basilic veins, left-sided basilic vein for arteriovenous fistula creation.    At this time, Johnny Johnny [name] unsure as to whether he wants to pursue right-sided brachiocephalic fistula creation, which Johnny [name] a single operation, versus a left-sided brachiobasilic fistula creation which requires 2 surgeries as it will need to be transposed.  He would like some time to weigh whether he would like permanent HD access in his dominant arm versus a staged procedure as this will require a longer period of time with his tunneled dialysis catheter.  Asked him to call my office once he has made his decision.  I am happy to schedule him.   Johnny Johnny [name] Total time of patient care including pre-visit research, consultation, and documentation greater than 20 minutes

## 2022-02-25 NOTE — Anesthesia Preprocedure Evaluation (Addendum)
Anesthesia Evaluation  Patient identified by MRN, date of birth, ID band Patient awake    Reviewed: Allergy & Precautions, NPO status , Patient's Chart, lab work & pertinent test results, reviewed documented beta blocker date and time   Airway Mallampati: II  TM Distance: >3 FB Neck ROM: Full    Dental  (+) Edentulous Upper, Edentulous Lower   Pulmonary sleep apnea    Pulmonary exam normal        Cardiovascular hypertension, Pt. on medications and Pt. on home beta blockers +CHF   Rhythm:Regular Rate:Normal     Neuro/Psych negative neurological ROS  negative psych ROS   GI/Hepatic negative GI ROS, Neg liver ROS,,,  Endo/Other  negative endocrine ROS    Renal/GU ESRF and DialysisRenal disease     Musculoskeletal negative musculoskeletal ROS (+)    Abdominal Normal abdominal exam  (+)   Peds  Hematology  (+) Blood dyscrasia, anemia   Anesthesia Other Findings   Reproductive/Obstetrics                             Anesthesia Physical Anesthesia Plan  ASA: 3  Anesthesia Plan: Regional   Post-op Pain Management: Regional block*   Induction: Intravenous  PONV Risk Score and Plan: 1 and Ondansetron, Dexamethasone, Propofol infusion and Treatment may vary due to age or medical condition  Airway Management Planned: Simple Face Mask, Nasal Cannula and Natural Airway  Additional Equipment: None  Intra-op Plan:   Post-operative Plan:   Informed Consent: I have reviewed the patients History and Physical, chart, labs and discussed the procedure including the risks, benefits and alternatives for the proposed anesthesia with the patient or authorized representative who has indicated his/her understanding and acceptance.     Dental advisory given  Plan Discussed with: CRNA  Anesthesia Plan Comments: (Ready for block 0705. Surgeon arrival [phone]. Delayed to OR due to surgeon arrival time. )         Anesthesia Quick Evaluation

## 2022-02-25 NOTE — Discharge Instructions (Signed)
° °  [facility] ° °Discharge Instructions ° °AV Fistula or Graft Surgery for Dialysis Access ° °Please refer to the following instructions for your post-procedure care. Your surgeon or physician assistant will discuss any changes with you. ° °Activity ° °You may drive the day following your surgery, if you are comfortable and no longer taking prescription pain medication. Resume full activity as the soreness in your incision resolves. ° °Bathing/Showering ° °You may shower after you go home. Keep your incision dry for 48 hours. Do not soak in a bathtub, hot tub, or swim until the incision heals completely. You may not shower if you have a hemodialysis catheter. ° °Incision Care ° °Clean your incision with mild soap and water after 48 hours. Pat the area dry with a clean towel. You do not need a bandage unless otherwise instructed. Do not apply any ointments or creams to your incision. You may have skin glue on your incision. Do not peel it off. It will come off on its own in about one week. Your arm may swell a bit after surgery. To reduce swelling use pillows to elevate your arm so it is above your heart. Your doctor will tell you if you need to lightly wrap your arm with an ACE bandage. ° °Diet ° °Resume your normal diet. There are not special food restrictions following this procedure. In order to heal from your surgery, it is CRITICAL to get adequate nutrition. Your body requires vitamins, minerals, and protein. Vegetables are the best source of vitamins and minerals. Vegetables also provide the perfect balance of protein. Processed food has little nutritional value, so try to avoid this. ° °Medications ° °Resume taking all of your medications. If your incision is causing pain, you may take over-the counter pain relievers such as acetaminophen (Tylenol). If you were prescribed a stronger pain medication, please be aware these medications can cause nausea and constipation. Prevent  nausea by taking the medication with a snack or meal. Avoid constipation by drinking plenty of fluids and eating foods with high amount of fiber, such as fruits, vegetables, and grains. Do not take Tylenol if you are taking prescription pain medications. ° ° ° ° °Follow up °Your surgeon may want to see you in the office following your access surgery. If so, this will be arranged at the time of your surgery. ° °Please call us immediately for any of the following conditions: ° °Increased pain, redness, drainage (pus) from your incision site °Fever of 101 degrees or higher °Severe or worsening pain at your incision site °Hand pain or numbness. ° °Reduce your risk of vascular disease: ° °Stop smoking. If you would like help, call [facility] at 1-800-QUIT-NOW (1-800-784-8669) or [place] at 336-586-4000 ° °Manage your cholesterol °Maintain a desired weight °Control your diabetes °Keep your blood pressure down ° °Dialysis ° °It will take several weeks to several months for your new dialysis access to be ready for use. Your surgeon will determine when it is OK to use it. Your nephrologist will continue to direct your dialysis. You can continue to use your Permcath until your new access is ready for use. ° °If you have any questions, please call the office at [phone]. ° °

## 2022-02-25 NOTE — Transfer of Care (Signed)
Immediate Anesthesia Transfer of Care Note  Patient: Johnny [name]  Procedure(s) Performed: LEFT ARTERIOVENOUS (AV) FISTULA CREATION (Left: Arm Upper)  Patient Location: PACU  Anesthesia Type:MAC  Level of Consciousness: awake, alert , and oriented  Airway & Oxygen Therapy: Patient Spontanous Breathing  Post-op Assessment: Report given to RN and Post -op Vital signs reviewed and stable  Post vital signs: Reviewed and stable  Last Vitals:  Vitals Value Taken Time  BP    Temp    Pulse    Resp    SpO2      Last Pain:  Vitals:   02/25/22 0628  TempSrc:   PainSc: 0-No pain         Complications: No notable events documented.

## 2022-02-25 NOTE — Op Note (Signed)
    NAME: Johnny [name]    MRN: [id number] DOB: 15-Mar-1960    DATE OF OPERATION: 02/25/2022  PREOP DIAGNOSIS:    End stage renal disease  POSTOP DIAGNOSIS:    Same  PROCEDURE:    Left arm, first stage brachiobasilic fistula  SURGEON: [name]  ASSIST: Roxy Horseman  ANESTHESIA: Block, moderate   EBL: 78m  INDICATIONS:    Johnny [name] a 62y.o. male  who presents s/p left radiocephalic AV fistula 44/09/81and most recently branch ligation on 10/12/21 to assist with maturity of the fistula.     His fistula was working until recent infiltration resulted in thrombosis.  Unfortunately, he now requires new HD access.  Vein mapping was evaluated, and demonstrated sufficient right-sided cephalic and basilic veins, left-sided basilic vein for arteriovenous fistula creation.     Johnny [name] asked for left sided brachiobasilic fistula as he is right-hand dominant. He understands this will be a two staged operation. After discussing the risks and benefits of left arm brachiobasilic fistula, Johnny [name] elected to proceed.   FINDINGS:   538mbasilic vein 54m19mrachial artery  TECHNIQUE:   A left upper extremity block was performed by the anesthesia team.   The patient was brought to the operating room and placed in supine position. The left arm was prepped and draped in a standard fashion. IV antibiotics were prior to incision. A timeout was performed.   The basilic vein in the left arm was identified using ultrasound and appeared of sufficient size. A transverse incision was made above the elbow creese in the antecubital fossa. The basilic vein was identified and isolated for 4 cm in length.  The bicipital aponeurosis was partially released and the brachial artery freed from its paired brachial veins and secured with a vessel loop. The patient was heparinized. The basilic vein was marked and ligated distally with 2-0 silk, then flushed with heparinized saline. Vascular clamps were  placed proximally and distally on the brachial artery and a 5 mm arteriotomy  was created on the brachial artery. This was flushed with heparin saline. The vein was juxtaposed to the artery and an anastomosis was created using 6-0 Prolene.   Prior to completing the anastomsis, the vessels were flushed and the suture line was tied down. There was an excellent thrill in the basilic vein from the anastomosis into the upper arm. The patient had an excellent radial signal. The incision was irrigated and hemostasis acheived. The deeper tissue was closed with 3-0 Vicryl and the skin closed with 4-0 Monocryl.    Dermabond was applied the incisions. He was transferred to PACU in stable condition.     JosMacie BurowsD [facility]   02/25/2022

## 2022-02-25 NOTE — Anesthesia Procedure Notes (Signed)
Anesthesia Regional Block: Supraclavicular block   Pre-Anesthetic Checklist: , timeout performed,  Correct Patient, Correct Site, Correct Laterality,  Correct Procedure, Correct Position, site marked,  Risks and benefits discussed,  Surgical consent,  Pre-op evaluation,  At surgeon's request and post-op pain management  Laterality: Upper and Left  Prep: chloraprep       Needles:  Injection technique: Single-shot  Needle Type: Stimiplex          Additional Needles:   Procedures:,,,, ultrasound used (permanent image in chart),,    Narrative:  Start time: 02/25/2022 7:12 AM End time: 02/25/2022 7:32 AM Injection made incrementally with aspirations every 5 mL.  Performed by: Personally  Anesthesiologist: Nolon Nations, MD  Additional Notes: BP cuff, SpO2 and EKG monitors applied. Sedation begun. Nerve location verified with ultrasound. Anesthetic injected incrementally, slowly, and after neg aspirations under direct u/s guidance. Good perineural spread. Tolerated well.

## 2022-02-26 ENCOUNTER — Encounter (HOSPITAL_COMMUNITY): Payer: Self-pay | Admitting: Vascular Surgery

## 2022-02-26 NOTE — Anesthesia Postprocedure Evaluation (Signed)
Anesthesia Post Note  Patient: Johnny [name]  Procedure(s) Performed: LEFT ARTERIOVENOUS (AV) FISTULA CREATION (Left: Arm Upper)     Patient location during evaluation: PACU Anesthesia Type: Regional Level of consciousness: awake and alert Pain management: pain level controlled Vital Signs Assessment: post-procedure vital signs reviewed and stable Respiratory status: spontaneous breathing, nonlabored ventilation, respiratory function stable and patient connected to nasal cannula oxygen Cardiovascular status: stable and blood pressure returned to baseline Postop Assessment: no apparent nausea or vomiting Anesthetic complications: no   No notable events documented.  Last Vitals:  Vitals:   02/25/22 0915 02/25/22 0930  BP: 111/71 110/75  Pulse: 79 80  Resp: 12 (!) 21  Temp:  36.6 C  SpO2: 98% 100%    Last Pain:  Vitals:   02/25/22 0930  TempSrc:   PainSc: 0-No pain                 Tiajuana Amass

## 2022-03-22 ENCOUNTER — Other Ambulatory Visit: Payer: Self-pay | Admitting: *Deleted

## 2022-03-22 DIAGNOSIS — N186 End stage renal disease: Secondary | ICD-10-CM

## 2022-03-26 DIAGNOSIS — Z992 Dependence on renal dialysis: Secondary | ICD-10-CM | POA: Diagnosis not present

## 2022-03-26 DIAGNOSIS — N2581 Secondary hyperparathyroidism of renal origin: Secondary | ICD-10-CM | POA: Diagnosis not present

## 2022-03-26 DIAGNOSIS — N186 End stage renal disease: Secondary | ICD-10-CM | POA: Diagnosis not present

## 2022-03-29 DIAGNOSIS — N186 End stage renal disease: Secondary | ICD-10-CM | POA: Diagnosis not present

## 2022-03-29 DIAGNOSIS — Z992 Dependence on renal dialysis: Secondary | ICD-10-CM | POA: Diagnosis not present

## 2022-03-29 DIAGNOSIS — N2581 Secondary hyperparathyroidism of renal origin: Secondary | ICD-10-CM | POA: Diagnosis not present

## 2022-03-31 DIAGNOSIS — N2581 Secondary hyperparathyroidism of renal origin: Secondary | ICD-10-CM | POA: Diagnosis not present

## 2022-03-31 DIAGNOSIS — N186 End stage renal disease: Secondary | ICD-10-CM | POA: Diagnosis not present

## 2022-03-31 DIAGNOSIS — Z992 Dependence on renal dialysis: Secondary | ICD-10-CM | POA: Diagnosis not present

## 2022-04-02 ENCOUNTER — Ambulatory Visit (HOSPITAL_COMMUNITY)
Admission: RE | Admit: 2022-04-02 | Discharge: 2022-04-02 | Disposition: A | Discharge status: Home / Self Care | Payer: Medicare HMO | Source: Ambulatory Visit | Attending: Vascular Surgery

## 2022-04-02 ENCOUNTER — Ambulatory Visit (INDEPENDENT_AMBULATORY_CARE_PROVIDER_SITE_OTHER): Payer: Medicare HMO | Admitting: Physician Assistant

## 2022-04-02 VITALS — BP 128/80 | HR 82 | Temp 98.6°F | Resp 20 | Ht 69.0 in | Wt 286.7 lb

## 2022-04-02 DIAGNOSIS — N2581 Secondary hyperparathyroidism of renal origin: Secondary | ICD-10-CM | POA: Diagnosis not present

## 2022-04-02 DIAGNOSIS — Z992 Dependence on renal dialysis: Secondary | ICD-10-CM | POA: Diagnosis not present

## 2022-04-02 DIAGNOSIS — N186 End stage renal disease: Secondary | ICD-10-CM

## 2022-04-02 NOTE — Progress Notes (Signed)
POST OPERATIVE OFFICE NOTE    CC:  F/u for surgery  HPI:  This is a 62 y.o. male who is s/p left first stage basilic AV fistula  by Dr. Virl Cagey.  He has previous left CB av fistula that failed to mature after branch ligation.    Pt returns today for follow up.  Pt states he has no symptoms of steal.  No motor or sensation loss.  He is is here follow up duplex and plan of second stage basilic transposition.     Allergies  Allergen Reactions   Lisinopril Swelling and Other (See Comments)    Angioedema    Current Outpatient Medications  Medication Sig Dispense Refill   amLODipine (NORVASC) 5 MG tablet Take 1 tablet (5 mg total) by mouth daily. 30 tablet 0   ascorbic acid (VITAMIN C) 500 MG tablet Take 1 tablet (500 mg total) by mouth daily. 30 tablet 0   calcitRIOL (ROCALTROL) 0.5 MCG capsule Take 2 capsules (1 mcg total) by mouth daily. (Patient taking differently: Take 1 mcg by mouth every Monday, Wednesday, and Friday with hemodialysis.) 30 capsule 0   carvedilol (COREG) 12.5 MG tablet Take 1 tablet (12.5 mg total) by mouth 2 (two) times daily with a meal. 60 tablet 0   cholecalciferol (VITAMIN D3) 25 MCG (1000 UNIT) tablet Take 1,000 Units by mouth daily.     HYDROcodone-acetaminophen (NORCO) 5-325 MG tablet Take 1 tablet by mouth every 4 (four) hours as needed for moderate pain. 6 tablet 0   RENVELA 800 MG tablet Take 1,600 mg by mouth 3 (three) times daily with meals.     No current facility-administered medications for this visit.     ROS:  See HPI  Physical Exam:    Incision:  well healed Extremities:  palpable thrill and radial pulse left UE Neuro:  sensation intact grip 5/5  Findings:  +--------------------+----------+-----------------+--------+  AVF                PSV (cm/s)Flow Vol (mL/min)Comments  +--------------------+----------+-----------------+--------+  Native artery inflow   289          2181                  +--------------------+----------+-----------------+--------+  AVF Anastomosis        292                               +--------------------+----------+-----------------+--------+     +------------+----------+-------------+----------+-------------------------  ----+  OUTFLOW VEINPSV (cm/s)Diameter (cm)Depth (cm)          Describe              +------------+----------+-------------+----------+-------------------------  ----+  Prox UA        103        1.31        1.09                                   +------------+----------+-------------+----------+-------------------------  ----+  Mid UA         171        1.09        1.34                                   +------------+----------+-------------+----------+-------------------------  ----+  Dist UA  495        0.40        0.85   competing branch and  0.22 cm,                                                         100 cm/s              +------------+----------+-------------+----------+-------------------------  ----+        Summary:  Patent arteriovenous fistula.    Assessment/Plan:  This is a 62 y.o. male who is s/p:ESRD S/P first stage basilic AV fistula  The flow rate is excellent > 2000, the diameter throughout the fistula is > 0.6 cm.  There is a competing branch and anastomosis narrowing.  I will schedule him for second stage basilic transposition.     Roxy Horseman PA-C [facility] [phone]   Clinic MD:  Virl Cagey

## 2022-04-05 ENCOUNTER — Other Ambulatory Visit: Payer: Self-pay

## 2022-04-05 ENCOUNTER — Telehealth: Payer: Self-pay

## 2022-04-05 DIAGNOSIS — N186 End stage renal disease: Secondary | ICD-10-CM

## 2022-04-05 DIAGNOSIS — Z992 Dependence on renal dialysis: Secondary | ICD-10-CM | POA: Diagnosis not present

## 2022-04-05 DIAGNOSIS — N2581 Secondary hyperparathyroidism of renal origin: Secondary | ICD-10-CM | POA: Diagnosis not present

## 2022-04-05 NOTE — Telephone Encounter (Signed)
Called pt to schedule his surgery. He stated he would prefer a call back tomorrow to schedule, as he was in bed and felt weak after dialysis.

## 2022-04-06 ENCOUNTER — Encounter (HOSPITAL_COMMUNITY): Payer: Self-pay | Admitting: Vascular Surgery

## 2022-04-06 ENCOUNTER — Other Ambulatory Visit: Payer: Self-pay

## 2022-04-06 NOTE — Progress Notes (Signed)
[name] denies chest pain or shortness of breath.  Patient denies having any s/s of Covid in his household, also denies any known exposure to Covid. [name] PCP is Dr. Erskine Emery.  [name] has been on dialysis for about 1 year, lots of fluid can be removed, patient reports that he feels so much better.

## 2022-04-07 DIAGNOSIS — Z992 Dependence on renal dialysis: Secondary | ICD-10-CM | POA: Diagnosis not present

## 2022-04-07 DIAGNOSIS — N186 End stage renal disease: Secondary | ICD-10-CM | POA: Diagnosis not present

## 2022-04-07 DIAGNOSIS — N2581 Secondary hyperparathyroidism of renal origin: Secondary | ICD-10-CM | POA: Diagnosis not present

## 2022-04-07 NOTE — Anesthesia Preprocedure Evaluation (Signed)
Anesthesia Evaluation  Patient identified by MRN, date of birth, ID band Patient awake    Reviewed: Allergy & Precautions, NPO status , Patient's Chart, lab work & pertinent test results  Airway Mallampati: I  TM Distance: >3 FB Neck ROM: Full    Dental  (+) Edentulous Upper, Edentulous Lower   Pulmonary sleep apnea     + decreased breath sounds      Cardiovascular hypertension, +CHF   Rhythm:Regular Rate:Normal     Neuro/Psych negative neurological ROS  negative psych ROS   GI/Hepatic negative GI ROS, Neg liver ROS,,,  Endo/Other  negative endocrine ROS    Renal/GU ESRF and DialysisRenal disease     Musculoskeletal negative musculoskeletal ROS (+)    Abdominal   Peds  Hematology negative hematology ROS (+)   Anesthesia Other Findings   Reproductive/Obstetrics                             Anesthesia Physical Anesthesia Plan  ASA: 3  Anesthesia Plan: Regional   Post-op Pain Management:    Induction: Intravenous  PONV Risk Score and Plan: 0 and Propofol infusion  Airway Management Planned: Natural Airway and Nasal CPAP  Additional Equipment: None  Intra-op Plan:   Post-operative Plan:   Informed Consent: I have reviewed the patients History and Physical, chart, labs and discussed the procedure including the risks, benefits and alternatives for the proposed anesthesia with the patient or authorized representative who has indicated his/her understanding and acceptance.       Plan Discussed with: CRNA  Anesthesia Plan Comments:        Anesthesia Quick Evaluation

## 2022-04-08 ENCOUNTER — Ambulatory Visit (HOSPITAL_COMMUNITY): Payer: Medicare HMO | Admitting: Anesthesiology

## 2022-04-08 ENCOUNTER — Other Ambulatory Visit: Payer: Self-pay

## 2022-04-08 ENCOUNTER — Ambulatory Visit (HOSPITAL_BASED_OUTPATIENT_CLINIC_OR_DEPARTMENT_OTHER): Payer: Medicare HMO | Admitting: Anesthesiology

## 2022-04-08 ENCOUNTER — Encounter (HOSPITAL_COMMUNITY)
Admission: RE | Disposition: A | Discharge status: Home / Self Care | Payer: Self-pay | Source: Home / Self Care | Attending: Vascular Surgery

## 2022-04-08 ENCOUNTER — Ambulatory Visit (HOSPITAL_COMMUNITY)
Admission: RE | Admit: 2022-04-08 | Discharge: 2022-04-08 | Disposition: A | Discharge status: Home / Self Care | Payer: Medicare HMO | Attending: Vascular Surgery | Admitting: Vascular Surgery

## 2022-04-08 ENCOUNTER — Encounter (HOSPITAL_COMMUNITY): Payer: Self-pay | Admitting: Vascular Surgery

## 2022-04-08 DIAGNOSIS — I509 Heart failure, unspecified: Secondary | ICD-10-CM

## 2022-04-08 DIAGNOSIS — N186 End stage renal disease: Secondary | ICD-10-CM

## 2022-04-08 DIAGNOSIS — N185 Chronic kidney disease, stage 5: Secondary | ICD-10-CM | POA: Diagnosis not present

## 2022-04-08 DIAGNOSIS — Z992 Dependence on renal dialysis: Secondary | ICD-10-CM | POA: Diagnosis not present

## 2022-04-08 DIAGNOSIS — G4730 Sleep apnea, unspecified: Secondary | ICD-10-CM | POA: Diagnosis not present

## 2022-04-08 DIAGNOSIS — I132 Hypertensive heart and chronic kidney disease with heart failure and with stage 5 chronic kidney disease, or end stage renal disease: Secondary | ICD-10-CM | POA: Insufficient documentation

## 2022-04-08 HISTORY — PX: BASCILIC VEIN TRANSPOSITION: SHX5742

## 2022-04-08 LAB — POCT I-STAT, CHEM 8
BUN: 43 mg/dL — ABNORMAL HIGH (ref 6–20)
Calcium, Ion: 1.12 mmol/L — ABNORMAL LOW (ref 1.15–1.40)
Chloride: 95 mmol/L — ABNORMAL LOW (ref 98–111)
Creatinine, Ser: 11.2 mg/dL — ABNORMAL HIGH (ref 0.61–1.24)
Glucose, Bld: 91 mg/dL (ref 70–99)
HCT: 35 % — ABNORMAL LOW (ref 39.0–52.0)
Hemoglobin: 11.9 g/dL — ABNORMAL LOW (ref 13.0–17.0)
Potassium: 3.9 mmol/L (ref 3.5–5.1)
Sodium: 136 mmol/L (ref 135–145)
TCO2: 30 mmol/L (ref 22–32)

## 2022-04-08 SURGERY — TRANSPOSITION, VEIN, BASILIC
Anesthesia: Regional | Laterality: Left

## 2022-04-08 MED ORDER — SODIUM CHLORIDE 0.9 % IV SOLN: INTRAVENOUS | Status: DC

## 2022-04-08 MED ORDER — CHLORHEXIDINE GLUCONATE 4 % EX LIQD: 60.0000 mL | Freq: Once | CUTANEOUS | Status: DC

## 2022-04-08 MED ORDER — 0.9 % SODIUM CHLORIDE (POUR BTL) OPTIME
TOPICAL | Status: DC | PRN
  Administered 2022-04-08: 1000 mL

## 2022-04-08 MED ORDER — LIDOCAINE HCL (PF) 1 % IJ SOLN
INTRAMUSCULAR | Status: AC
  Filled 2022-04-08: qty 30

## 2022-04-08 MED ORDER — HEPARIN SODIUM (PORCINE) 1000 UNIT/ML IJ SOLN
INTRAMUSCULAR | Status: AC
  Filled 2022-04-08: qty 10

## 2022-04-08 MED ORDER — PROTAMINE SULFATE 10 MG/ML IV SOLN
INTRAVENOUS | Status: AC
  Filled 2022-04-08: qty 5

## 2022-04-08 MED ORDER — HEPARIN SODIUM (PORCINE) 1000 UNIT/ML IJ SOLN
INTRAMUSCULAR | Status: DC | PRN
  Administered 2022-04-08: 5000 [IU] via INTRAVENOUS

## 2022-04-08 MED ORDER — PHENYLEPHRINE 80 MCG/ML (10ML) SYRINGE FOR IV PUSH (FOR BLOOD PRESSURE SUPPORT)
PREFILLED_SYRINGE | INTRAVENOUS | Status: DC | PRN
  Administered 2022-04-08: 80 ug via INTRAVENOUS

## 2022-04-08 MED ORDER — PROPOFOL 1000 MG/100ML IV EMUL
INTRAVENOUS | Status: AC
  Filled 2022-04-08: qty 100

## 2022-04-08 MED ORDER — HYDROCODONE-ACETAMINOPHEN 5-325 MG PO TABS: 1.0000 | ORAL_TABLET | Freq: Four times a day (QID) | ORAL | 0 refills | Status: AC | PRN

## 2022-04-08 MED ORDER — FENTANYL CITRATE (PF) 250 MCG/5ML IJ SOLN
INTRAMUSCULAR | Status: DC | PRN
  Administered 2022-04-08 (×2): 50 ug via INTRAVENOUS
  Administered 2022-04-08: 25 ug via INTRAVENOUS

## 2022-04-08 MED ORDER — HEPARIN 6000 UNIT IRRIGATION SOLUTION
Status: DC | PRN
  Administered 2022-04-08: 1

## 2022-04-08 MED ORDER — PROPOFOL 10 MG/ML IV BOLUS
INTRAVENOUS | Status: AC
  Filled 2022-04-08: qty 20

## 2022-04-08 MED ORDER — PROTAMINE SULFATE 10 MG/ML IV SOLN
INTRAVENOUS | Status: DC | PRN
  Administered 2022-04-08: 20 mg via INTRAVENOUS

## 2022-04-08 MED ORDER — MIDAZOLAM HCL 2 MG/2ML IJ SOLN
INTRAMUSCULAR | Status: DC | PRN
  Administered 2022-04-08 (×2): 1 mg via INTRAVENOUS

## 2022-04-08 MED ORDER — ROPIVACAINE HCL 5 MG/ML IJ SOLN
INTRAMUSCULAR | Status: DC | PRN
  Administered 2022-04-08: 30 mL via PERINEURAL

## 2022-04-08 MED ORDER — HEPARIN 6000 UNIT IRRIGATION SOLUTION
Status: AC
  Filled 2022-04-08: qty 500

## 2022-04-08 MED ORDER — PROPOFOL 10 MG/ML IV BOLUS
INTRAVENOUS | Status: DC | PRN
  Administered 2022-04-08: 25 mg via INTRAVENOUS

## 2022-04-08 MED ORDER — CHLORHEXIDINE GLUCONATE 0.12 % MT SOLN
15.0000 mL | Freq: Once | OROMUCOSAL | Status: AC
  Administered 2022-04-08: 15 mL via OROMUCOSAL
  Filled 2022-04-08: qty 15

## 2022-04-08 MED ORDER — PHENYLEPHRINE HCL-NACL 20-0.9 MG/250ML-% IV SOLN
INTRAVENOUS | Status: DC | PRN
  Administered 2022-04-08: 25 ug/min via INTRAVENOUS

## 2022-04-08 MED ORDER — CEFAZOLIN IN SODIUM CHLORIDE 3-0.9 GM/100ML-% IV SOLN
3.0000 g | INTRAVENOUS | Status: AC
  Administered 2022-04-08: 3 g via INTRAVENOUS
  Filled 2022-04-08: qty 100

## 2022-04-08 MED ORDER — PROPOFOL 500 MG/50ML IV EMUL
INTRAVENOUS | Status: DC | PRN
  Administered 2022-04-08: 75 ug/kg/min via INTRAVENOUS
  Administered 2022-04-08: 65 ug/kg/min via INTRAVENOUS

## 2022-04-08 MED ORDER — MIDAZOLAM HCL 2 MG/2ML IJ SOLN
INTRAMUSCULAR | Status: AC
  Filled 2022-04-08: qty 2

## 2022-04-08 MED ORDER — FENTANYL CITRATE (PF) 250 MCG/5ML IJ SOLN
INTRAMUSCULAR | Status: AC
  Filled 2022-04-08: qty 5

## 2022-04-08 MED ORDER — ORAL CARE MOUTH RINSE: 15.0000 mL | Freq: Once | OROMUCOSAL | Status: AC

## 2022-04-08 SURGICAL SUPPLY — 52 items
ADH SKN CLS APL DERMABOND .7 (GAUZE/BANDAGES/DRESSINGS) ×1
ARMBAND PINK RESTRICT EXTREMIT (MISCELLANEOUS) ×2 IMPLANT
BAG COUNTER SPONGE SURGICOUNT (BAG) ×2 IMPLANT
BAG SPNG CNTER NS LX DISP (BAG) ×1
BNDG ELASTIC 4X5.8 VLCR STR LF (GAUZE/BANDAGES/DRESSINGS) ×2 IMPLANT
CANISTER SUCT 3000ML PPV (MISCELLANEOUS) ×2 IMPLANT
CLIP LIGATING EXTRA MED SLVR (CLIP) ×2 IMPLANT
CLIP LIGATING EXTRA SM BLUE (MISCELLANEOUS) ×2 IMPLANT
COVER PROBE W GEL 5X96 (DRAPES) ×2 IMPLANT
DERMABOND ADVANCED .7 DNX12 (GAUZE/BANDAGES/DRESSINGS) ×2 IMPLANT
ELECT REM PT RETURN 9FT ADLT (ELECTROSURGICAL) ×1
ELECTRODE REM PT RTRN 9FT ADLT (ELECTROSURGICAL) ×2 IMPLANT
GLOVE BIO SURGEON STRL SZ 6 (GLOVE) IMPLANT
GLOVE BIO SURGEON STRL SZ7.5 (GLOVE) IMPLANT
GLOVE BIOGEL PI IND STRL 6.5 (GLOVE) IMPLANT
GLOVE BIOGEL PI IND STRL 7.5 (GLOVE) IMPLANT
GLOVE BIOGEL PI IND STRL 8 (GLOVE) ×2 IMPLANT
GLOVE SURG SS PI 6.5 STRL IVOR (GLOVE) IMPLANT
GOWN STRL REUS W/ TWL LRG LVL3 (GOWN DISPOSABLE) ×4 IMPLANT
GOWN STRL REUS W/ TWL XL LVL3 (GOWN DISPOSABLE) IMPLANT
GOWN STRL REUS W/TWL 2XL LVL3 (GOWN DISPOSABLE) ×4 IMPLANT
GOWN STRL REUS W/TWL LRG LVL3 (GOWN DISPOSABLE) ×4
GOWN STRL REUS W/TWL XL LVL3 (GOWN DISPOSABLE) ×1
KIT BASIN OR (CUSTOM PROCEDURE TRAY) ×2 IMPLANT
KIT TURNOVER KIT B (KITS) ×2 IMPLANT
NDL HYPO 25GX1X1/2 BEV (NEEDLE) ×2 IMPLANT
NEEDLE HYPO 25GX1X1/2 BEV (NEEDLE) ×1 IMPLANT
NS IRRIG 1000ML POUR BTL (IV SOLUTION) ×2 IMPLANT
PACK CV ACCESS (CUSTOM PROCEDURE TRAY) ×2 IMPLANT
PAD ARMBOARD 7.5X6 YLW CONV (MISCELLANEOUS) ×4 IMPLANT
SLING ARM FOAM STRAP LRG (SOFTGOODS) IMPLANT
SLING ARM FOAM STRAP MED (SOFTGOODS) IMPLANT
SOL PREP POV-IOD 4OZ 10% (MISCELLANEOUS) IMPLANT
SOL SCRUB PVP POV-IOD 4OZ 7.5% (MISCELLANEOUS) ×1
SOLUTION SCRB POV-IOD 4OZ 7.5% (MISCELLANEOUS) IMPLANT
SPIKE FLUID TRANSFER (MISCELLANEOUS) ×2 IMPLANT
SPONGE T-LAP 18X18 ~~LOC~~+RFID (SPONGE) IMPLANT
SUT MNCRL AB 4-0 PS2 18 (SUTURE) ×2 IMPLANT
SUT PROLENE 6 0 BV (SUTURE) ×2 IMPLANT
SUT PROLENE 7 0 BV 1 (SUTURE) IMPLANT
SUT SILK 2 0 SH (SUTURE) IMPLANT
SUT SILK 2 0 SH CR/8 (SUTURE) ×2 IMPLANT
SUT SILK 3 0 (SUTURE) ×1
SUT SILK 3-0 18XBRD TIE 12 (SUTURE) IMPLANT
SUT VIC AB 2-0 CT1 27 (SUTURE)
SUT VIC AB 2-0 CT1 TAPERPNT 27 (SUTURE) ×2 IMPLANT
SUT VIC AB 3-0 SH 27 (SUTURE) ×3
SUT VIC AB 3-0 SH 27X BRD (SUTURE) ×4 IMPLANT
TOWEL GREEN STERILE (TOWEL DISPOSABLE) ×2 IMPLANT
TUBE CONNECTING 20X1/4 (TUBING) IMPLANT
UNDERPAD 30X36 HEAVY ABSORB (UNDERPADS AND DIAPERS) ×2 IMPLANT
WATER STERILE IRR 1000ML POUR (IV SOLUTION) ×2 IMPLANT

## 2022-04-08 NOTE — Discharge Instructions (Addendum)
    NO HEAVY LIFTING UNTIL YOU HAVE BEEN SEEN IN THE OFFICE AFTER SURGERY         [facility]  Discharge Instructions  AV Fistula or Graft Surgery for Dialysis Access  Please refer to the following instructions for your post-procedure care. Your surgeon or physician assistant will discuss any changes with you.  Activity  You may drive the day following your surgery, if you are comfortable and no longer taking prescription pain medication. Resume full activity as the soreness in your incision resolves.  Bathing/Showering  You may shower after you go home. Keep your incision dry for 48 hours. Do not soak in a bathtub, hot tub, or swim until the incision heals completely. You may not shower if you have a hemodialysis catheter.  Incision Care  Clean your incision with mild soap and water after 48 hours. Pat the area dry with a clean towel. You do not need a bandage unless otherwise instructed. Do not apply any ointments or creams to your incision. You may have skin glue on your incision. Do not peel it off. It will come off on its own in about one week. Your arm may swell a bit after surgery. To reduce swelling use pillows to elevate your arm so it is above your heart. Your doctor will tell you if you need to lightly wrap your arm with an ACE bandage.  Diet  Resume your normal diet. There are not special food restrictions following this procedure. In order to heal from your surgery, it is CRITICAL to get adequate nutrition. Your body requires vitamins, minerals, and protein. Vegetables are the best source of vitamins and minerals. Vegetables also provide the perfect balance of protein. Processed food has little nutritional value, so try to avoid this.  Medications  Resume taking all of your medications. If your incision is causing pain, you may take over-the counter pain relievers such as acetaminophen (Tylenol). If you were prescribed a stronger pain  medication, please be aware these medications can cause nausea and constipation. Prevent nausea by taking the medication with a snack or meal. Avoid constipation by drinking plenty of fluids and eating foods with high amount of fiber, such as fruits, vegetables, and grains. Do not take Tylenol if you are taking prescription pain medications.     Follow up Your surgeon may want to see you in the office following your access surgery. If so, this will be arranged at the time of your surgery.  Please call us immediately for any of the following conditions:  Increased pain, redness, drainage (pus) from your incision site Fever of 101 degrees or higher Severe or worsening pain at your incision site Hand pain or numbness.  Reduce your risk of vascular disease:  Stop smoking. If you would like help, call [facility] at 1-800-QUIT-NOW (571)329-4664) or [place] at [place] your cholesterol Maintain a desired weight Control your diabetes Keep your blood pressure down  Dialysis  It will take several weeks to several months for your new dialysis access to be ready for use. Your surgeon will determine when it is OK to use it. Your nephrologist will continue to direct your dialysis. You can continue to use your Permcath until your new access is ready for use.  If you have any questions, please call the office at [phone].

## 2022-04-08 NOTE — Transfer of Care (Signed)
Immediate Anesthesia Transfer of Care Note  Patient: Johnny [name]  Procedure(s) Performed: LEFT ARM SECOND STAGE BASILIC VEIN TRANSPOSITION (Left)  Patient Location: PACU  Anesthesia Type:MAC combined with regional for post-op pain  Level of Consciousness: awake, alert , oriented, and patient cooperative  Airway & Oxygen Therapy: Patient Spontanous Breathing and Patient connected to nasal cannula oxygen  Post-op Assessment: Report given to RN and Post -op Vital signs reviewed and stable  Post vital signs: Reviewed and stable  Last Vitals:  Vitals Value Taken Time  BP 141/54 04/08/22 1004  Temp    Pulse 75 04/08/22 1006  Resp 20 04/08/22 1006  SpO2 97 % 04/08/22 1006  Vitals shown include unvalidated device data.  Last Pain:  Vitals:   04/08/22 0621  TempSrc:   PainSc: 0-No pain         Complications: No notable events documented.

## 2022-04-08 NOTE — Op Note (Signed)
    NAME: Johnny [name]    MRN: [id number] DOB: Aug 17, 1960    DATE OF OPERATION: 04/08/2022  PREOP DIAGNOSIS:    End stage renal disease  POSTOP DIAGNOSIS:    Same  PROCEDURE:    Left arm brachiobasilic fistula transposition  SURGEON: [name]  ASSIST: Dagoberto Ligas PA  ANESTHESIA: Moderate, block   EBL: 57m  INDICATIONS:    Johnny [name] a 62y.o. male Who is end-stage renal disease status post for stage brachiobasilic fistula creation.  At his last office visit, the fistula was of adequate size and flow rate.  After discussing the risk and benefits of left-sided brachiobasilic fistula transposition for long-term HD access, [name] to proceed.  FINDINGS:    67-2CNbrachiobasilic fistula with multiple branches  TECHNIQUE:   Patient was brought to the OR and laid in supine position.  Moderate anesthesia was induced. The patient was prepped and draped in standard fashion.  Lidocaine was brought to the field and a local block was performed.   The case began with left arm ultrasound fistula mapping.  Multiple branches were noted and marked.  Three longitudinal skip incisions were made along the course of the basilic vein with 5 cm bridges.  This was carried through the subcutaneous fat to the brachiobasilic fistula.  The fistula was mobilized and multiple branches ligated using 2-0 silk and clips.  Once mobilized,  a gore tunneler was brought on to the field, and a subcutaneous tunnel was made along the medial aspect of the bicep, proximal to distal. Next, the patient as heparinized, fistula marked, clamped and transected. The vein was pulled through the tunnel tract and reanastomosed using two, 6.0 prolene sutures in running fashion in the axilla where the fistula was the largest to prevent narrowing. The wound bed was irrigated with saline, hemostasis achieved with suture and cautery. The wounds were closed with layers of vicryl suture with monocryl and dermabond  at the skin. There was a palpable thrill in the fistula at case completion with excellent pulse in the wrist.     Given the complexity of the case,  the assistant was necessary in order to expedient the procedure and safely perform the technical aspects of the operation.  The assistant provided traction and countertraction to assist with exposure of the artery and vein.  They also assisted with suture ligation of multiple venous branches.  They played a critical role in the anastomosis. These skills, especially following the Prolene suture for the anastomosis, could not have been adequately performed by a scrub tech assistant.   JMacie Burows MD [facility] [place]:   04/08/2022

## 2022-04-08 NOTE — Anesthesia Procedure Notes (Signed)
Procedure Name: MAC Date/Time: 04/08/2022 7:48 AM  Performed by: Colin Benton, CRNAPre-anesthesia Checklist: Patient identified, Emergency Drugs available, Suction available and Patient being monitored Patient Re-evaluated:Patient Re-evaluated prior to induction Oxygen Delivery Method: Nasal cannula Induction Type: IV induction Placement Confirmation: positive ETCO2 Dental Injury: Teeth and Oropharynx as per pre-operative assessment

## 2022-04-08 NOTE — Anesthesia Postprocedure Evaluation (Signed)
Anesthesia Post Note  Patient: [name]  Procedure(s) Performed: LEFT ARM SECOND STAGE BASILIC VEIN TRANSPOSITION (Left)     Patient location during evaluation: PACU Anesthesia Type: Regional Level of consciousness: awake and alert Pain management: pain level controlled Vital Signs Assessment: post-procedure vital signs reviewed and stable Respiratory status: spontaneous breathing, nonlabored ventilation, respiratory function stable and patient connected to nasal cannula oxygen Cardiovascular status: stable and blood pressure returned to baseline Postop Assessment: no apparent nausea or vomiting Anesthetic complications: no  No notable events documented.  Last Vitals:  Vitals:   04/08/22 1005 04/08/22 1020  BP: (!) 141/54 127/69  Pulse: 81 74  Resp: 20 15  Temp: 36.5 C 36.5 C  SpO2: 98% 96%    Last Pain:  Vitals:   04/08/22 1020  TempSrc:   PainSc: 0-No pain                 Johnny [name]

## 2022-04-08 NOTE — H&P (Signed)
Patient seen and examined in preop holding.  No complaints. No changes to medication history or physical exam since last seen in clinic. After discussing the risks and benefits of left arm brachiobasilic fistula transposition, Johnny [name] elected to proceed.   Johnny John MD   CC:  F/u for surgery  HPI:  This is a 62 y.o. male who is s/p left first stage basilic AV fistula  by Dr. Virl [name].  He has previous left CB av fistula that failed to mature after branch ligation.    Pt returns today for follow up.  Pt states he has no symptoms of steal.  No motor or sensation loss.  He is is here follow up duplex and plan of second stage basilic transposition.     Allergies  Allergen Reactions   Lisinopril Swelling and Other (See Comments)    Angioedema    Current Facility-Administered Medications  Medication Dose Route Frequency Provider Last Rate Last Admin   0.9 %  sodium chloride infusion   Intravenous Continuous Johnny John, MD   New Bag at 04/08/22 [phone]   ceFAZolin (ANCEF) IVPB 3g/100 mL premix  3 g Intravenous 30 min Pre-Op Johnny John, MD       chlorhexidine (HIBICLENS) 4 % liquid 4 Application  60 mL Topical Once Johnny John, MD       And   [START ON 04/09/2022] chlorhexidine (HIBICLENS) 4 % liquid 4 Application  60 mL Topical Once Johnny John, MD       heparin 6000 units / NS 500 mL irrigation    PRN Johnny John, MD   1 Application at [id number]   Facility-Administered Medications Ordered in Other Encounters  Medication Dose Route Frequency Provider Last Rate Last Admin   fentaNYL citrate (PF) (SUBLIMAZE) injection   Intravenous Anesthesia Intra-op Colin Benton, CRNA   50 mcg at 04/08/22 0718   midazolam (VERSED) injection   Intravenous Anesthesia Intra-op Everlean Cherry A, CRNA   1 mg at 04/08/22 0717     ROS:  See HPI  Physical Exam:    Incision:  well healed Extremities:  palpable thrill and radial pulse left UE Neuro:  sensation  intact grip 5/5  Findings:  +--------------------+----------+-----------------+--------+  AVF                PSV (cm/s)Flow Vol (mL/min)Comments  +--------------------+----------+-----------------+--------+  Native artery inflow   289          2181                 +--------------------+----------+-----------------+--------+  AVF Anastomosis        292                               +--------------------+----------+-----------------+--------+     +------------+----------+-------------+----------+-------------------------  ----+  OUTFLOW VEINPSV (cm/s)Diameter (cm)Depth (cm)          Describe              +------------+----------+-------------+----------+-------------------------  ----+  Prox UA        103        1.31        1.09                                   +------------+----------+-------------+----------+-------------------------  ----+  Mid UA  171        1.09        1.34                                   +------------+----------+-------------+----------+-------------------------  ----+  Dist UA        495        0.40        0.85   competing branch and  0.22 cm,                                                         100 cm/s              +------------+----------+-------------+----------+-------------------------  ----+        Summary:  Patent arteriovenous fistula.    Assessment/Plan:  This is a 62 y.o. male who is s/p:ESRD S/P first stage basilic AV fistula  The flow rate is excellent > 2000, the diameter throughout the fistula is > 0.6 cm.  There is a competing branch and anastomosis narrowing.  I will schedule him for second stage basilic transposition.     Johnny John PA-C [facility] [phone]   Clinic MD:  Johnny [name]

## 2022-04-08 NOTE — Anesthesia Procedure Notes (Signed)
Anesthesia Regional Block: Supraclavicular block   Pre-Anesthetic Checklist: , timeout performed,  Correct Patient, Correct Site, Correct Laterality,  Correct Procedure, Correct Position, site marked,  Risks and benefits discussed,  Surgical consent,  Pre-op evaluation,  At surgeon's request and post-op pain management  Laterality: Left  Prep: chloraprep       Needles:  Injection technique: Single-shot  Needle Type: Echogenic Stimulator Needle     Needle Length: 9cm  Needle Gauge: 21     Additional Needles:   Procedures:,,,, ultrasound used (permanent image in chart),,    Narrative:  Start time: 04/08/2022 7:15 AM End time: 04/08/2022 7:20 AM Injection made incrementally with aspirations every 5 mL.  Performed by: Personally  Anesthesiologist: Effie Berkshire, MD  Additional Notes: Discussed risks and benefits of the nerve block in detail, including but not limited vascular injury, permanent nerve damage and infection.   Patient tolerated the procedure well. Local anesthetic introduced in an incremental fashion under minimal resistance after negative aspirations. No paresthesias were elicited. After completion of the procedure, no acute issues were identified and patient continued to be monitored by RN.

## 2022-04-09 ENCOUNTER — Encounter (HOSPITAL_COMMUNITY): Payer: Self-pay | Admitting: Vascular Surgery

## 2022-04-09 DIAGNOSIS — N2581 Secondary hyperparathyroidism of renal origin: Secondary | ICD-10-CM | POA: Diagnosis not present

## 2022-04-09 DIAGNOSIS — Z992 Dependence on renal dialysis: Secondary | ICD-10-CM | POA: Diagnosis not present

## 2022-04-09 DIAGNOSIS — N186 End stage renal disease: Secondary | ICD-10-CM | POA: Diagnosis not present

## 2022-04-12 DIAGNOSIS — N2581 Secondary hyperparathyroidism of renal origin: Secondary | ICD-10-CM | POA: Diagnosis not present

## 2022-04-12 DIAGNOSIS — N186 End stage renal disease: Secondary | ICD-10-CM | POA: Diagnosis not present

## 2022-04-12 DIAGNOSIS — Z992 Dependence on renal dialysis: Secondary | ICD-10-CM | POA: Diagnosis not present

## 2022-04-14 DIAGNOSIS — N186 End stage renal disease: Secondary | ICD-10-CM | POA: Diagnosis not present

## 2022-04-14 DIAGNOSIS — Z992 Dependence on renal dialysis: Secondary | ICD-10-CM | POA: Diagnosis not present

## 2022-04-14 DIAGNOSIS — N2581 Secondary hyperparathyroidism of renal origin: Secondary | ICD-10-CM | POA: Diagnosis not present

## 2022-04-16 DIAGNOSIS — Z992 Dependence on renal dialysis: Secondary | ICD-10-CM | POA: Diagnosis not present

## 2022-04-16 DIAGNOSIS — N186 End stage renal disease: Secondary | ICD-10-CM | POA: Diagnosis not present

## 2022-04-16 DIAGNOSIS — N2581 Secondary hyperparathyroidism of renal origin: Secondary | ICD-10-CM | POA: Diagnosis not present

## 2022-04-19 DIAGNOSIS — Z992 Dependence on renal dialysis: Secondary | ICD-10-CM | POA: Diagnosis not present

## 2022-04-19 DIAGNOSIS — N186 End stage renal disease: Secondary | ICD-10-CM | POA: Diagnosis not present

## 2022-04-19 DIAGNOSIS — N2581 Secondary hyperparathyroidism of renal origin: Secondary | ICD-10-CM | POA: Diagnosis not present

## 2022-04-21 DIAGNOSIS — Z992 Dependence on renal dialysis: Secondary | ICD-10-CM | POA: Diagnosis not present

## 2022-04-21 DIAGNOSIS — N186 End stage renal disease: Secondary | ICD-10-CM | POA: Diagnosis not present

## 2022-04-21 DIAGNOSIS — N2581 Secondary hyperparathyroidism of renal origin: Secondary | ICD-10-CM | POA: Diagnosis not present

## 2022-04-22 ENCOUNTER — Ambulatory Visit (INDEPENDENT_AMBULATORY_CARE_PROVIDER_SITE_OTHER): Payer: Medicare HMO | Admitting: Physician Assistant

## 2022-04-22 VITALS — BP 130/80 | HR 99 | Temp 97.9°F | Resp 20 | Ht 69.0 in | Wt 289.5 lb

## 2022-04-22 DIAGNOSIS — N186 End stage renal disease: Secondary | ICD-10-CM

## 2022-04-22 DIAGNOSIS — I120 Hypertensive chronic kidney disease with stage 5 chronic kidney disease or end stage renal disease: Secondary | ICD-10-CM | POA: Diagnosis not present

## 2022-04-22 DIAGNOSIS — Z992 Dependence on renal dialysis: Secondary | ICD-10-CM | POA: Diagnosis not present

## 2022-04-22 NOTE — Progress Notes (Signed)
  POST OPERATIVE OFFICE NOTE    CC:  F/u for surgery  HPI:  This is a 62 y.o. male who is s/p Left arm brachiobasilic fistula transposition   on 04/08/22 by Dr. Virl Cagey.    He has previous left CB av fistula that failed to mature after branch ligation.   He has a working IJ [facility] that was placed by IR 05/30/21.    Pt returns today for follow up.  Pt states he is exercising to help the fistula mature.  He denise pain, loss of sensation or loss of motor.   He does have edema surrounding the harvest incisions on the left UE arm dorsum.  There is no erythema and no change in the size of the edema.   He is on HD via [facility]   Allergies  Allergen Reactions   Lisinopril Swelling and Other (See Comments)    Angioedema    Current Outpatient Medications  Medication Sig Dispense Refill   amLODipine (NORVASC) 5 MG tablet Take 1 tablet (5 mg total) by mouth daily. (Patient taking differently: Take 5 mg by mouth daily. Takes on non dialysis days.) 30 tablet 0   ascorbic acid (VITAMIN C) 500 MG tablet Take 1 tablet (500 mg total) by mouth daily. 30 tablet 0   calcitRIOL (ROCALTROL) 0.5 MCG capsule Take 2 capsules (1 mcg total) by mouth daily. (Patient taking differently: Take 1 mcg by mouth every Monday, Wednesday, and Friday with hemodialysis.) 30 capsule 0   carvedilol (COREG) 12.5 MG tablet Take 1 tablet (12.5 mg total) by mouth 2 (two) times daily with a meal. 60 tablet 0   cholecalciferol (VITAMIN D3) 25 MCG (1000 UNIT) tablet Take 1,000 Units by mouth daily.     HYDROcodone-acetaminophen (NORCO) 5-325 MG tablet Take 1 tablet by mouth every 6 (six) hours as needed for moderate pain. 20 tablet 0   RENVELA 800 MG tablet Take 1,600 mg by mouth 3 (three) times daily with meals.     No current facility-administered medications for this visit.     ROS:  See HPI  Physical Exam:    Incision:  Incision healing well, mild edema on the dorsum of the left UE that is soft without skin ischemia.  There is an  easily palpable distal 1/3 thrill in the fistula.  Good doppler flow in the proximal 2/3. Extremities:  N/M intact equal B UE, grip 5/5 equal B UE     Assessment/Plan:  This is a 62 y.o. male who is s/p:Transposition left UE basilic av fistula   He is on HA via [place] placed by IR.  The fistula has a good thrill and he denise symptoms of steal.  The fistula will be accessible as of 05/24/22.  If he has problems or concerns he will call our office.  F/U as needed.    Roxy Horseman PA-C [facility] [phone]   Clinic MD:  Scot Dock

## 2022-04-23 DIAGNOSIS — N2581 Secondary hyperparathyroidism of renal origin: Secondary | ICD-10-CM | POA: Diagnosis not present

## 2022-04-23 DIAGNOSIS — N186 End stage renal disease: Secondary | ICD-10-CM | POA: Diagnosis not present

## 2022-04-23 DIAGNOSIS — Z992 Dependence on renal dialysis: Secondary | ICD-10-CM | POA: Diagnosis not present

## 2022-04-26 DIAGNOSIS — N186 End stage renal disease: Secondary | ICD-10-CM | POA: Diagnosis not present

## 2022-04-26 DIAGNOSIS — N2581 Secondary hyperparathyroidism of renal origin: Secondary | ICD-10-CM | POA: Diagnosis not present

## 2022-04-26 DIAGNOSIS — Z992 Dependence on renal dialysis: Secondary | ICD-10-CM | POA: Diagnosis not present

## 2022-04-28 DIAGNOSIS — N186 End stage renal disease: Secondary | ICD-10-CM | POA: Diagnosis not present

## 2022-04-28 DIAGNOSIS — Z992 Dependence on renal dialysis: Secondary | ICD-10-CM | POA: Diagnosis not present

## 2022-04-28 DIAGNOSIS — N2581 Secondary hyperparathyroidism of renal origin: Secondary | ICD-10-CM | POA: Diagnosis not present

## 2022-04-30 DIAGNOSIS — N2581 Secondary hyperparathyroidism of renal origin: Secondary | ICD-10-CM | POA: Diagnosis not present

## 2022-04-30 DIAGNOSIS — N186 End stage renal disease: Secondary | ICD-10-CM | POA: Diagnosis not present

## 2022-04-30 DIAGNOSIS — Z992 Dependence on renal dialysis: Secondary | ICD-10-CM | POA: Diagnosis not present

## 2022-05-03 DIAGNOSIS — N2581 Secondary hyperparathyroidism of renal origin: Secondary | ICD-10-CM | POA: Diagnosis not present

## 2022-05-03 DIAGNOSIS — N186 End stage renal disease: Secondary | ICD-10-CM | POA: Diagnosis not present

## 2022-05-03 DIAGNOSIS — Z992 Dependence on renal dialysis: Secondary | ICD-10-CM | POA: Diagnosis not present

## 2022-05-05 DIAGNOSIS — N2581 Secondary hyperparathyroidism of renal origin: Secondary | ICD-10-CM | POA: Diagnosis not present

## 2022-05-05 DIAGNOSIS — N186 End stage renal disease: Secondary | ICD-10-CM | POA: Diagnosis not present

## 2022-05-05 DIAGNOSIS — Z992 Dependence on renal dialysis: Secondary | ICD-10-CM | POA: Diagnosis not present

## 2022-05-06 ENCOUNTER — Encounter (INDEPENDENT_AMBULATORY_CARE_PROVIDER_SITE_OTHER): Payer: Self-pay | Admitting: Family Medicine

## 2022-05-06 ENCOUNTER — Ambulatory Visit (INDEPENDENT_AMBULATORY_CARE_PROVIDER_SITE_OTHER): Payer: Medicare HMO | Admitting: Family Medicine

## 2022-05-06 VITALS — BP 104/69 | HR 84 | Temp 97.8°F | Ht 69.0 in | Wt 286.0 lb

## 2022-05-06 DIAGNOSIS — N186 End stage renal disease: Secondary | ICD-10-CM

## 2022-05-06 DIAGNOSIS — I10 Essential (primary) hypertension: Secondary | ICD-10-CM

## 2022-05-06 DIAGNOSIS — Z992 Dependence on renal dialysis: Secondary | ICD-10-CM | POA: Diagnosis not present

## 2022-05-06 DIAGNOSIS — Z6841 Body Mass Index (BMI) 40.0 and over, adult: Secondary | ICD-10-CM

## 2022-05-06 DIAGNOSIS — I120 Hypertensive chronic kidney disease with stage 5 chronic kidney disease or end stage renal disease: Secondary | ICD-10-CM

## 2022-05-06 NOTE — Progress Notes (Signed)
Office: [phone]  /  Fax: [phone]   Initial Visit  Johnny [name] was seen in clinic today to evaluate for obesity. He is interested in losing weight to improve overall health and reduce the risk of weight related complications. He presents today to review program treatment options, initial physical assessment, and evaluation.     He was referred by: Specialist (nephrology- Dr Harrie Jeans)  When asked what else they would like to accomplish? He states: Improve existing medical conditions, Reduce risk for a surgery, and Improve quality of life would like to lose 30 lb.would like to have renal transplant  Weight history:was athletic, played football in college, started to gain weight in 30s - max weight 389 lb then started to to work out at the gym - lost 70 lb then go sick with ESRD.    When asked how has your weight affected you? He states: Contributed to medical problems  Some associated conditions: Hypertension and Kidney disease  Contributing factors: Family history, Nutritional, Reduced physical activity, and Life event Works with special needs adults- fairly active, gets 5- 6 hrs of sleep Weight promoting medications identified: None  Current nutrition plan: None and Other: renal diet  Current level of physical activity: Walking  Current or previous pharmacotherapy: None  Response to medication: Never tried medications   Past medical history includes:   Past Medical History:  Diagnosis Date   Acquired lymphedema 12/13/2017   Anemia    CHF (congestive heart failure) (HCC)    Chronic kidney disease    dialysis via right IJ TDC on  M/W/F at [address]   Diverticulosis of large intestine without hemorrhage 01/31/2012   Essential HTN 04/21/2006   Excess skin of thigh    left thigh adipose excess   Hypertension    OSA 05/21/2013   does not wear CPAP any longer   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  Peak Weight:400 , Body Fat%:39.7, Visceral Fat Rating:26, Weight trend over the last 12 months: Unchanged  General:  Alert, oriented and cooperative. Patient is in no acute distress.  Respiratory: Normal respiratory effort, no problems with respiration noted   Gait: able to ambulate independently  Mental Status: Normal mood and affect. Normal behavior. Normal judgment and thought content.   DIAGNOSTIC DATA REVIEWED:  BMET    Component Value Date/Time   NA 136 04/08/2022 0618   NA 136 06/11/2021 1128   K 3.9 04/08/2022 0618   CL 95 (L) 04/08/2022 0618   CO2 24 08/27/2021 0015   GLUCOSE 91 04/08/2022 0618   BUN 43 (H) 04/08/2022 0618   BUN 23 06/11/2021 1128   CREATININE 11.20 (H) 04/08/2022 0618   CREATININE 2.94 (H) 09/04/2015 1035   CALCIUM 8.8 (L) 08/27/2021 0015   GFRNONAA 7 (L) 08/27/2021 0015   GFRNONAA 23 (L) 09/04/2015 1035   GFRAA 18 (L) 12/31/2019 1024   GFRAA 27 (L) 09/04/2015 1035   Lab Results  Component Value Date   HGBA1C 5.3 06/12/2019   HGBA1C 5.9 09/05/2013   No results found for: "INSULIN" CBC    Component Value Date/Time   WBC 8.9 08/27/2021 0015  RBC 3.14 (L) 08/27/2021 0015   HGB 11.9 (L) 04/08/2022 0618   HGB 7.8 (L) 06/11/2021 1128   HCT 35.0 (L) 04/08/2022 0618   HCT 23.5 (L) 06/11/2021 1128   PLT 174 08/27/2021 0015   PLT 281 06/11/2021 1128   MCV 94.6 08/27/2021 0015   MCV 87 06/11/2021 1128   MCH 31.8 08/27/2021 0015   MCHC 33.7 08/27/2021 0015   RDW 16.6 (H) 08/27/2021 0015   RDW 14.7 06/11/2021 1128   Iron/TIBC/Ferritin/ %Sat    Component Value Date/Time   IRON 39 (L) 04/15/2020 1112   TIBC 182 (L) 03/11/2019 0305   FERRITIN 33.0 04/15/2020 1112   FERRITIN 80 06/18/2019 1447   IRONPCTSAT 12.5 (L) 04/15/2020 1112   Lipid Panel     Component Value  Date/Time   CHOL 159 12/31/2019 1024   TRIG 115 12/31/2019 1024   HDL 46 12/31/2019 1024   CHOLHDL 3.5 12/31/2019 1024   CHOLHDL 3.5 03/13/2013 1507   VLDL 22 03/13/2013 1507   LDLCALC 92 12/31/2019 1024   LDLDIRECT 94 04/06/2007 2301   Hepatic Function Panel     Component Value Date/Time   PROT 7.4 08/27/2021 0015   ALBUMIN 2.9 (L) 08/27/2021 0015   AST 20 08/27/2021 0015   ALT 19 08/27/2021 0015   ALKPHOS 55 08/27/2021 0015   BILITOT 0.6 08/27/2021 0015   BILIDIR 0.1 08/27/2021 0015   IBILI 0.5 08/27/2021 0015      Component Value Date/Time   TSH 4.020 04/25/2013 1357     Assessment and Plan:   Essential hypertension Assessment & Plan: Continue current anti hypertensive regimen per nephrology  Avoid use of stimulants   Morbid obesity ([place])  BMI 40.0-44.9, adult ([place])  ESRD (end stage renal disease) ([place]) Assessment & Plan: On HD MWF Has seen RD at Dialysis- on renal diet  Avoid use of medications for obesity management due to ESRD Begin active plan for weight reduction with the hopes of future renal transplant         Obesity Treatment / Action Plan:  Patient will work on garnering support from family and friends to begin weight loss journey. Will work on eliminating or reducing the presence of highly palatable, calorie dense foods in the home. Will complete provided nutritional and psychosocial assessment questionnaire before the next appointment. Will be scheduled for indirect calorimetry to determine resting energy expenditure in a fasting state.  This will allow [place] to create a reduced calorie, high-protein meal plan to promote loss of fat mass while preserving muscle mass. Will think about ideas on how to incorporate physical activity into their daily routine. Will work on improving sleep hygiene and trying to obtain at least 7 hours of sleep. Was counseled on nutritional approaches to weight loss and benefits of complex carbs and high quality  protein as part of nutritional weight management.  Obesity Education Performed Today:  He was weighed on the bioimpedance scale and results were discussed and documented in the synopsis.  We discussed obesity as a disease and the importance of a more detailed evaluation of all the factors contributing to the disease.  We discussed the importance of long term lifestyle changes which include nutrition, exercise and behavioral modifications as well as the importance of customizing this to his specific health and social needs.  We discussed the benefits of reaching a healthier weight to alleviate the symptoms of existing conditions and reduce the risks of the biomechanical, metabolic and psychological effects of obesity.  [employer]  Johnny [name] appears to be in the action stage of change and states they are ready to start intensive lifestyle modifications and behavioral modifications.  30 minutes was spent today on this visit including the above counseling, pre-visit chart review, and post-visit documentation.  Reviewed by clinician on day of visit: allergies, medications, problem list, medical history, surgical history, family history, social history, and previous encounter notes pertinent to obesity diagnosis.    Loyal Gambler, D.O. DABFM, DABOM [facility] [phone], [place] [id number] [phone]

## 2022-05-06 NOTE — Assessment & Plan Note (Signed)
Continue current anti hypertensive regimen per nephrology  Avoid use of stimulants

## 2022-05-06 NOTE — Assessment & Plan Note (Signed)
On HD MWF Has seen RD at Dialysis- on renal diet  Avoid use of medications for obesity management due to ESRD Begin active plan for weight reduction with the hopes of future renal transplant

## 2022-05-07 DIAGNOSIS — Z992 Dependence on renal dialysis: Secondary | ICD-10-CM | POA: Diagnosis not present

## 2022-05-07 DIAGNOSIS — N186 End stage renal disease: Secondary | ICD-10-CM | POA: Diagnosis not present

## 2022-05-07 DIAGNOSIS — N2581 Secondary hyperparathyroidism of renal origin: Secondary | ICD-10-CM | POA: Diagnosis not present

## 2022-05-10 DIAGNOSIS — Z992 Dependence on renal dialysis: Secondary | ICD-10-CM | POA: Diagnosis not present

## 2022-05-10 DIAGNOSIS — N2581 Secondary hyperparathyroidism of renal origin: Secondary | ICD-10-CM | POA: Diagnosis not present

## 2022-05-10 DIAGNOSIS — N186 End stage renal disease: Secondary | ICD-10-CM | POA: Diagnosis not present

## 2022-05-12 DIAGNOSIS — N186 End stage renal disease: Secondary | ICD-10-CM | POA: Diagnosis not present

## 2022-05-12 DIAGNOSIS — N2581 Secondary hyperparathyroidism of renal origin: Secondary | ICD-10-CM | POA: Diagnosis not present

## 2022-05-12 DIAGNOSIS — Z992 Dependence on renal dialysis: Secondary | ICD-10-CM | POA: Diagnosis not present

## 2022-05-14 DIAGNOSIS — N186 End stage renal disease: Secondary | ICD-10-CM | POA: Diagnosis not present

## 2022-05-14 DIAGNOSIS — Z992 Dependence on renal dialysis: Secondary | ICD-10-CM | POA: Diagnosis not present

## 2022-05-14 DIAGNOSIS — N2581 Secondary hyperparathyroidism of renal origin: Secondary | ICD-10-CM | POA: Diagnosis not present

## 2022-05-17 DIAGNOSIS — Z992 Dependence on renal dialysis: Secondary | ICD-10-CM | POA: Diagnosis not present

## 2022-05-17 DIAGNOSIS — N2581 Secondary hyperparathyroidism of renal origin: Secondary | ICD-10-CM | POA: Diagnosis not present

## 2022-05-17 DIAGNOSIS — N186 End stage renal disease: Secondary | ICD-10-CM | POA: Diagnosis not present

## 2022-05-19 DIAGNOSIS — Z992 Dependence on renal dialysis: Secondary | ICD-10-CM | POA: Diagnosis not present

## 2022-05-19 DIAGNOSIS — N186 End stage renal disease: Secondary | ICD-10-CM | POA: Diagnosis not present

## 2022-05-19 DIAGNOSIS — N2581 Secondary hyperparathyroidism of renal origin: Secondary | ICD-10-CM | POA: Diagnosis not present

## 2022-05-21 DIAGNOSIS — Z992 Dependence on renal dialysis: Secondary | ICD-10-CM | POA: Diagnosis not present

## 2022-05-21 DIAGNOSIS — N2581 Secondary hyperparathyroidism of renal origin: Secondary | ICD-10-CM | POA: Diagnosis not present

## 2022-05-21 DIAGNOSIS — N186 End stage renal disease: Secondary | ICD-10-CM | POA: Diagnosis not present

## 2022-05-23 DIAGNOSIS — N186 End stage renal disease: Secondary | ICD-10-CM | POA: Diagnosis not present

## 2022-05-23 DIAGNOSIS — I120 Hypertensive chronic kidney disease with stage 5 chronic kidney disease or end stage renal disease: Secondary | ICD-10-CM | POA: Diagnosis not present

## 2022-05-23 DIAGNOSIS — Z992 Dependence on renal dialysis: Secondary | ICD-10-CM | POA: Diagnosis not present

## 2022-05-24 DIAGNOSIS — N2581 Secondary hyperparathyroidism of renal origin: Secondary | ICD-10-CM | POA: Diagnosis not present

## 2022-05-24 DIAGNOSIS — N186 End stage renal disease: Secondary | ICD-10-CM | POA: Diagnosis not present

## 2022-05-24 DIAGNOSIS — Z992 Dependence on renal dialysis: Secondary | ICD-10-CM | POA: Diagnosis not present

## 2022-05-26 DIAGNOSIS — Z992 Dependence on renal dialysis: Secondary | ICD-10-CM | POA: Diagnosis not present

## 2022-05-26 DIAGNOSIS — N2581 Secondary hyperparathyroidism of renal origin: Secondary | ICD-10-CM | POA: Diagnosis not present

## 2022-05-26 DIAGNOSIS — N186 End stage renal disease: Secondary | ICD-10-CM | POA: Diagnosis not present

## 2022-05-28 DIAGNOSIS — N186 End stage renal disease: Secondary | ICD-10-CM | POA: Diagnosis not present

## 2022-05-28 DIAGNOSIS — Z992 Dependence on renal dialysis: Secondary | ICD-10-CM | POA: Diagnosis not present

## 2022-05-28 DIAGNOSIS — N2581 Secondary hyperparathyroidism of renal origin: Secondary | ICD-10-CM | POA: Diagnosis not present

## 2022-05-31 DIAGNOSIS — Z992 Dependence on renal dialysis: Secondary | ICD-10-CM | POA: Diagnosis not present

## 2022-05-31 DIAGNOSIS — N186 End stage renal disease: Secondary | ICD-10-CM | POA: Diagnosis not present

## 2022-05-31 DIAGNOSIS — N2581 Secondary hyperparathyroidism of renal origin: Secondary | ICD-10-CM | POA: Diagnosis not present

## 2022-06-02 DIAGNOSIS — N186 End stage renal disease: Secondary | ICD-10-CM | POA: Diagnosis not present

## 2022-06-02 DIAGNOSIS — N2581 Secondary hyperparathyroidism of renal origin: Secondary | ICD-10-CM | POA: Diagnosis not present

## 2022-06-02 DIAGNOSIS — Z992 Dependence on renal dialysis: Secondary | ICD-10-CM | POA: Diagnosis not present

## 2022-06-04 DIAGNOSIS — N186 End stage renal disease: Secondary | ICD-10-CM | POA: Diagnosis not present

## 2022-06-04 DIAGNOSIS — Z992 Dependence on renal dialysis: Secondary | ICD-10-CM | POA: Diagnosis not present

## 2022-06-04 DIAGNOSIS — N2581 Secondary hyperparathyroidism of renal origin: Secondary | ICD-10-CM | POA: Diagnosis not present

## 2022-06-07 DIAGNOSIS — N2581 Secondary hyperparathyroidism of renal origin: Secondary | ICD-10-CM | POA: Diagnosis not present

## 2022-06-07 DIAGNOSIS — Z992 Dependence on renal dialysis: Secondary | ICD-10-CM | POA: Diagnosis not present

## 2022-06-07 DIAGNOSIS — N186 End stage renal disease: Secondary | ICD-10-CM | POA: Diagnosis not present

## 2022-06-09 DIAGNOSIS — N186 End stage renal disease: Secondary | ICD-10-CM | POA: Diagnosis not present

## 2022-06-09 DIAGNOSIS — Z992 Dependence on renal dialysis: Secondary | ICD-10-CM | POA: Diagnosis not present

## 2022-06-09 DIAGNOSIS — N2581 Secondary hyperparathyroidism of renal origin: Secondary | ICD-10-CM | POA: Diagnosis not present

## 2022-06-11 DIAGNOSIS — Z992 Dependence on renal dialysis: Secondary | ICD-10-CM | POA: Diagnosis not present

## 2022-06-11 DIAGNOSIS — N2581 Secondary hyperparathyroidism of renal origin: Secondary | ICD-10-CM | POA: Diagnosis not present

## 2022-06-11 DIAGNOSIS — N186 End stage renal disease: Secondary | ICD-10-CM | POA: Diagnosis not present

## 2022-06-14 DIAGNOSIS — N2581 Secondary hyperparathyroidism of renal origin: Secondary | ICD-10-CM | POA: Diagnosis not present

## 2022-06-14 DIAGNOSIS — Z992 Dependence on renal dialysis: Secondary | ICD-10-CM | POA: Diagnosis not present

## 2022-06-14 DIAGNOSIS — N186 End stage renal disease: Secondary | ICD-10-CM | POA: Diagnosis not present

## 2022-06-16 DIAGNOSIS — Z992 Dependence on renal dialysis: Secondary | ICD-10-CM | POA: Diagnosis not present

## 2022-06-16 DIAGNOSIS — N2581 Secondary hyperparathyroidism of renal origin: Secondary | ICD-10-CM | POA: Diagnosis not present

## 2022-06-16 DIAGNOSIS — N186 End stage renal disease: Secondary | ICD-10-CM | POA: Diagnosis not present

## 2022-06-17 DIAGNOSIS — Z992 Dependence on renal dialysis: Secondary | ICD-10-CM | POA: Diagnosis not present

## 2022-06-17 DIAGNOSIS — N186 End stage renal disease: Secondary | ICD-10-CM | POA: Diagnosis not present

## 2022-06-17 DIAGNOSIS — Z452 Encounter for adjustment and management of vascular access device: Secondary | ICD-10-CM | POA: Diagnosis not present

## 2022-06-18 DIAGNOSIS — N2581 Secondary hyperparathyroidism of renal origin: Secondary | ICD-10-CM | POA: Diagnosis not present

## 2022-06-18 DIAGNOSIS — N186 End stage renal disease: Secondary | ICD-10-CM | POA: Diagnosis not present

## 2022-06-18 DIAGNOSIS — Z992 Dependence on renal dialysis: Secondary | ICD-10-CM | POA: Diagnosis not present

## 2022-06-21 DIAGNOSIS — N186 End stage renal disease: Secondary | ICD-10-CM | POA: Diagnosis not present

## 2022-06-21 DIAGNOSIS — Z992 Dependence on renal dialysis: Secondary | ICD-10-CM | POA: Diagnosis not present

## 2022-06-21 DIAGNOSIS — N2581 Secondary hyperparathyroidism of renal origin: Secondary | ICD-10-CM | POA: Diagnosis not present

## 2022-06-22 DIAGNOSIS — N186 End stage renal disease: Secondary | ICD-10-CM | POA: Diagnosis not present

## 2022-06-22 DIAGNOSIS — Z992 Dependence on renal dialysis: Secondary | ICD-10-CM | POA: Diagnosis not present

## 2022-06-22 DIAGNOSIS — I120 Hypertensive chronic kidney disease with stage 5 chronic kidney disease or end stage renal disease: Secondary | ICD-10-CM | POA: Diagnosis not present

## 2022-06-23 DIAGNOSIS — N2581 Secondary hyperparathyroidism of renal origin: Secondary | ICD-10-CM | POA: Diagnosis not present

## 2022-06-23 DIAGNOSIS — N186 End stage renal disease: Secondary | ICD-10-CM | POA: Diagnosis not present

## 2022-06-23 DIAGNOSIS — Z992 Dependence on renal dialysis: Secondary | ICD-10-CM | POA: Diagnosis not present

## 2022-06-25 DIAGNOSIS — N186 End stage renal disease: Secondary | ICD-10-CM | POA: Diagnosis not present

## 2022-06-25 DIAGNOSIS — Z992 Dependence on renal dialysis: Secondary | ICD-10-CM | POA: Diagnosis not present

## 2022-06-25 DIAGNOSIS — N2581 Secondary hyperparathyroidism of renal origin: Secondary | ICD-10-CM | POA: Diagnosis not present

## 2022-06-28 DIAGNOSIS — Z992 Dependence on renal dialysis: Secondary | ICD-10-CM | POA: Diagnosis not present

## 2022-06-28 DIAGNOSIS — N186 End stage renal disease: Secondary | ICD-10-CM | POA: Diagnosis not present

## 2022-06-28 DIAGNOSIS — N2581 Secondary hyperparathyroidism of renal origin: Secondary | ICD-10-CM | POA: Diagnosis not present

## 2022-06-30 DIAGNOSIS — N186 End stage renal disease: Secondary | ICD-10-CM | POA: Diagnosis not present

## 2022-06-30 DIAGNOSIS — Z992 Dependence on renal dialysis: Secondary | ICD-10-CM | POA: Diagnosis not present

## 2022-06-30 DIAGNOSIS — N2581 Secondary hyperparathyroidism of renal origin: Secondary | ICD-10-CM | POA: Diagnosis not present

## 2022-07-02 DIAGNOSIS — N186 End stage renal disease: Secondary | ICD-10-CM | POA: Diagnosis not present

## 2022-07-02 DIAGNOSIS — Z992 Dependence on renal dialysis: Secondary | ICD-10-CM | POA: Diagnosis not present

## 2022-07-02 DIAGNOSIS — N2581 Secondary hyperparathyroidism of renal origin: Secondary | ICD-10-CM | POA: Diagnosis not present

## 2022-07-05 DIAGNOSIS — N186 End stage renal disease: Secondary | ICD-10-CM | POA: Diagnosis not present

## 2022-07-05 DIAGNOSIS — N2581 Secondary hyperparathyroidism of renal origin: Secondary | ICD-10-CM | POA: Diagnosis not present

## 2022-07-05 DIAGNOSIS — Z992 Dependence on renal dialysis: Secondary | ICD-10-CM | POA: Diagnosis not present

## 2022-07-07 DIAGNOSIS — Z992 Dependence on renal dialysis: Secondary | ICD-10-CM | POA: Diagnosis not present

## 2022-07-07 DIAGNOSIS — N2581 Secondary hyperparathyroidism of renal origin: Secondary | ICD-10-CM | POA: Diagnosis not present

## 2022-07-07 DIAGNOSIS — N186 End stage renal disease: Secondary | ICD-10-CM | POA: Diagnosis not present

## 2022-07-09 DIAGNOSIS — N186 End stage renal disease: Secondary | ICD-10-CM | POA: Diagnosis not present

## 2022-07-09 DIAGNOSIS — N2581 Secondary hyperparathyroidism of renal origin: Secondary | ICD-10-CM | POA: Diagnosis not present

## 2022-07-09 DIAGNOSIS — Z992 Dependence on renal dialysis: Secondary | ICD-10-CM | POA: Diagnosis not present

## 2022-07-12 DIAGNOSIS — N186 End stage renal disease: Secondary | ICD-10-CM | POA: Diagnosis not present

## 2022-07-12 DIAGNOSIS — N2581 Secondary hyperparathyroidism of renal origin: Secondary | ICD-10-CM | POA: Diagnosis not present

## 2022-07-12 DIAGNOSIS — Z992 Dependence on renal dialysis: Secondary | ICD-10-CM | POA: Diagnosis not present

## 2022-07-14 DIAGNOSIS — Z992 Dependence on renal dialysis: Secondary | ICD-10-CM | POA: Diagnosis not present

## 2022-07-14 DIAGNOSIS — N2581 Secondary hyperparathyroidism of renal origin: Secondary | ICD-10-CM | POA: Diagnosis not present

## 2022-07-14 DIAGNOSIS — N186 End stage renal disease: Secondary | ICD-10-CM | POA: Diagnosis not present

## 2022-07-16 DIAGNOSIS — N186 End stage renal disease: Secondary | ICD-10-CM | POA: Diagnosis not present

## 2022-07-16 DIAGNOSIS — Z992 Dependence on renal dialysis: Secondary | ICD-10-CM | POA: Diagnosis not present

## 2022-07-16 DIAGNOSIS — N2581 Secondary hyperparathyroidism of renal origin: Secondary | ICD-10-CM | POA: Diagnosis not present

## 2022-07-19 DIAGNOSIS — N2581 Secondary hyperparathyroidism of renal origin: Secondary | ICD-10-CM | POA: Diagnosis not present

## 2022-07-19 DIAGNOSIS — N186 End stage renal disease: Secondary | ICD-10-CM | POA: Diagnosis not present

## 2022-07-19 DIAGNOSIS — Z992 Dependence on renal dialysis: Secondary | ICD-10-CM | POA: Diagnosis not present

## 2022-07-21 DIAGNOSIS — N2581 Secondary hyperparathyroidism of renal origin: Secondary | ICD-10-CM | POA: Diagnosis not present

## 2022-07-21 DIAGNOSIS — Z992 Dependence on renal dialysis: Secondary | ICD-10-CM | POA: Diagnosis not present

## 2022-07-21 DIAGNOSIS — N186 End stage renal disease: Secondary | ICD-10-CM | POA: Diagnosis not present

## 2022-07-22 DIAGNOSIS — H16223 Keratoconjunctivitis sicca, not specified as Sjogren's, bilateral: Secondary | ICD-10-CM | POA: Diagnosis not present

## 2022-07-22 DIAGNOSIS — Z961 Presence of intraocular lens: Secondary | ICD-10-CM | POA: Diagnosis not present

## 2022-07-23 DIAGNOSIS — Z992 Dependence on renal dialysis: Secondary | ICD-10-CM | POA: Diagnosis not present

## 2022-07-23 DIAGNOSIS — N2581 Secondary hyperparathyroidism of renal origin: Secondary | ICD-10-CM | POA: Diagnosis not present

## 2022-07-23 DIAGNOSIS — I120 Hypertensive chronic kidney disease with stage 5 chronic kidney disease or end stage renal disease: Secondary | ICD-10-CM | POA: Diagnosis not present

## 2022-07-23 DIAGNOSIS — N186 End stage renal disease: Secondary | ICD-10-CM | POA: Diagnosis not present

## 2022-07-26 DIAGNOSIS — N2581 Secondary hyperparathyroidism of renal origin: Secondary | ICD-10-CM | POA: Diagnosis not present

## 2022-07-26 DIAGNOSIS — N186 End stage renal disease: Secondary | ICD-10-CM | POA: Diagnosis not present

## 2022-07-26 DIAGNOSIS — Z992 Dependence on renal dialysis: Secondary | ICD-10-CM | POA: Diagnosis not present

## 2022-07-28 DIAGNOSIS — Z992 Dependence on renal dialysis: Secondary | ICD-10-CM | POA: Diagnosis not present

## 2022-07-28 DIAGNOSIS — N2581 Secondary hyperparathyroidism of renal origin: Secondary | ICD-10-CM | POA: Diagnosis not present

## 2022-07-28 DIAGNOSIS — N186 End stage renal disease: Secondary | ICD-10-CM | POA: Diagnosis not present

## 2022-07-30 DIAGNOSIS — N186 End stage renal disease: Secondary | ICD-10-CM | POA: Diagnosis not present

## 2022-07-30 DIAGNOSIS — N2581 Secondary hyperparathyroidism of renal origin: Secondary | ICD-10-CM | POA: Diagnosis not present

## 2022-07-30 DIAGNOSIS — Z992 Dependence on renal dialysis: Secondary | ICD-10-CM | POA: Diagnosis not present

## 2022-08-02 DIAGNOSIS — N186 End stage renal disease: Secondary | ICD-10-CM | POA: Diagnosis not present

## 2022-08-02 DIAGNOSIS — Z992 Dependence on renal dialysis: Secondary | ICD-10-CM | POA: Diagnosis not present

## 2022-08-02 DIAGNOSIS — N2581 Secondary hyperparathyroidism of renal origin: Secondary | ICD-10-CM | POA: Diagnosis not present

## 2022-08-04 DIAGNOSIS — Z992 Dependence on renal dialysis: Secondary | ICD-10-CM | POA: Diagnosis not present

## 2022-08-04 DIAGNOSIS — N186 End stage renal disease: Secondary | ICD-10-CM | POA: Diagnosis not present

## 2022-08-04 DIAGNOSIS — N2581 Secondary hyperparathyroidism of renal origin: Secondary | ICD-10-CM | POA: Diagnosis not present

## 2022-08-06 DIAGNOSIS — N186 End stage renal disease: Secondary | ICD-10-CM | POA: Diagnosis not present

## 2022-08-06 DIAGNOSIS — Z992 Dependence on renal dialysis: Secondary | ICD-10-CM | POA: Diagnosis not present

## 2022-08-06 DIAGNOSIS — N2581 Secondary hyperparathyroidism of renal origin: Secondary | ICD-10-CM | POA: Diagnosis not present

## 2022-08-09 DIAGNOSIS — Z992 Dependence on renal dialysis: Secondary | ICD-10-CM | POA: Diagnosis not present

## 2022-08-09 DIAGNOSIS — N186 End stage renal disease: Secondary | ICD-10-CM | POA: Diagnosis not present

## 2022-08-09 DIAGNOSIS — N2581 Secondary hyperparathyroidism of renal origin: Secondary | ICD-10-CM | POA: Diagnosis not present

## 2022-08-11 DIAGNOSIS — N2581 Secondary hyperparathyroidism of renal origin: Secondary | ICD-10-CM | POA: Diagnosis not present

## 2022-08-11 DIAGNOSIS — N186 End stage renal disease: Secondary | ICD-10-CM | POA: Diagnosis not present

## 2022-08-11 DIAGNOSIS — Z992 Dependence on renal dialysis: Secondary | ICD-10-CM | POA: Diagnosis not present

## 2022-08-13 DIAGNOSIS — Z992 Dependence on renal dialysis: Secondary | ICD-10-CM | POA: Diagnosis not present

## 2022-08-13 DIAGNOSIS — N186 End stage renal disease: Secondary | ICD-10-CM | POA: Diagnosis not present

## 2022-08-13 DIAGNOSIS — N2581 Secondary hyperparathyroidism of renal origin: Secondary | ICD-10-CM | POA: Diagnosis not present

## 2022-08-16 DIAGNOSIS — N186 End stage renal disease: Secondary | ICD-10-CM | POA: Diagnosis not present

## 2022-08-16 DIAGNOSIS — Z992 Dependence on renal dialysis: Secondary | ICD-10-CM | POA: Diagnosis not present

## 2022-08-16 DIAGNOSIS — N2581 Secondary hyperparathyroidism of renal origin: Secondary | ICD-10-CM | POA: Diagnosis not present

## 2022-08-18 DIAGNOSIS — N186 End stage renal disease: Secondary | ICD-10-CM | POA: Diagnosis not present

## 2022-08-18 DIAGNOSIS — Z992 Dependence on renal dialysis: Secondary | ICD-10-CM | POA: Diagnosis not present

## 2022-08-18 DIAGNOSIS — N2581 Secondary hyperparathyroidism of renal origin: Secondary | ICD-10-CM | POA: Diagnosis not present

## 2022-08-20 DIAGNOSIS — N186 End stage renal disease: Secondary | ICD-10-CM | POA: Diagnosis not present

## 2022-08-20 DIAGNOSIS — N2581 Secondary hyperparathyroidism of renal origin: Secondary | ICD-10-CM | POA: Diagnosis not present

## 2022-08-20 DIAGNOSIS — Z992 Dependence on renal dialysis: Secondary | ICD-10-CM | POA: Diagnosis not present

## 2022-08-22 DIAGNOSIS — Z992 Dependence on renal dialysis: Secondary | ICD-10-CM | POA: Diagnosis not present

## 2022-08-22 DIAGNOSIS — N186 End stage renal disease: Secondary | ICD-10-CM | POA: Diagnosis not present

## 2022-08-22 DIAGNOSIS — I120 Hypertensive chronic kidney disease with stage 5 chronic kidney disease or end stage renal disease: Secondary | ICD-10-CM | POA: Diagnosis not present

## 2022-08-23 DIAGNOSIS — Z992 Dependence on renal dialysis: Secondary | ICD-10-CM | POA: Diagnosis not present

## 2022-08-23 DIAGNOSIS — N186 End stage renal disease: Secondary | ICD-10-CM | POA: Diagnosis not present

## 2022-08-23 DIAGNOSIS — N2581 Secondary hyperparathyroidism of renal origin: Secondary | ICD-10-CM | POA: Diagnosis not present

## 2022-08-25 DIAGNOSIS — Z992 Dependence on renal dialysis: Secondary | ICD-10-CM | POA: Diagnosis not present

## 2022-08-25 DIAGNOSIS — N2581 Secondary hyperparathyroidism of renal origin: Secondary | ICD-10-CM | POA: Diagnosis not present

## 2022-08-25 DIAGNOSIS — N186 End stage renal disease: Secondary | ICD-10-CM | POA: Diagnosis not present

## 2022-08-27 DIAGNOSIS — Z992 Dependence on renal dialysis: Secondary | ICD-10-CM | POA: Diagnosis not present

## 2022-08-27 DIAGNOSIS — N2581 Secondary hyperparathyroidism of renal origin: Secondary | ICD-10-CM | POA: Diagnosis not present

## 2022-08-27 DIAGNOSIS — N186 End stage renal disease: Secondary | ICD-10-CM | POA: Diagnosis not present

## 2022-08-30 DIAGNOSIS — Z992 Dependence on renal dialysis: Secondary | ICD-10-CM | POA: Diagnosis not present

## 2022-08-30 DIAGNOSIS — N186 End stage renal disease: Secondary | ICD-10-CM | POA: Diagnosis not present

## 2022-08-30 DIAGNOSIS — N2581 Secondary hyperparathyroidism of renal origin: Secondary | ICD-10-CM | POA: Diagnosis not present

## 2022-09-01 DIAGNOSIS — N2581 Secondary hyperparathyroidism of renal origin: Secondary | ICD-10-CM | POA: Diagnosis not present

## 2022-09-01 DIAGNOSIS — N186 End stage renal disease: Secondary | ICD-10-CM | POA: Diagnosis not present

## 2022-09-01 DIAGNOSIS — Z992 Dependence on renal dialysis: Secondary | ICD-10-CM | POA: Diagnosis not present

## 2022-09-03 DIAGNOSIS — N186 End stage renal disease: Secondary | ICD-10-CM | POA: Diagnosis not present

## 2022-09-03 DIAGNOSIS — N2581 Secondary hyperparathyroidism of renal origin: Secondary | ICD-10-CM | POA: Diagnosis not present

## 2022-09-03 DIAGNOSIS — Z992 Dependence on renal dialysis: Secondary | ICD-10-CM | POA: Diagnosis not present

## 2022-09-06 DIAGNOSIS — N2581 Secondary hyperparathyroidism of renal origin: Secondary | ICD-10-CM | POA: Diagnosis not present

## 2022-09-06 DIAGNOSIS — Z992 Dependence on renal dialysis: Secondary | ICD-10-CM | POA: Diagnosis not present

## 2022-09-06 DIAGNOSIS — N186 End stage renal disease: Secondary | ICD-10-CM | POA: Diagnosis not present

## 2022-09-08 DIAGNOSIS — Z992 Dependence on renal dialysis: Secondary | ICD-10-CM | POA: Diagnosis not present

## 2022-09-08 DIAGNOSIS — N186 End stage renal disease: Secondary | ICD-10-CM | POA: Diagnosis not present

## 2022-09-08 DIAGNOSIS — N2581 Secondary hyperparathyroidism of renal origin: Secondary | ICD-10-CM | POA: Diagnosis not present

## 2022-09-10 DIAGNOSIS — Z992 Dependence on renal dialysis: Secondary | ICD-10-CM | POA: Diagnosis not present

## 2022-09-10 DIAGNOSIS — N2581 Secondary hyperparathyroidism of renal origin: Secondary | ICD-10-CM | POA: Diagnosis not present

## 2022-09-10 DIAGNOSIS — N186 End stage renal disease: Secondary | ICD-10-CM | POA: Diagnosis not present

## 2022-09-13 DIAGNOSIS — Z992 Dependence on renal dialysis: Secondary | ICD-10-CM | POA: Diagnosis not present

## 2022-09-13 DIAGNOSIS — N186 End stage renal disease: Secondary | ICD-10-CM | POA: Diagnosis not present

## 2022-09-13 DIAGNOSIS — N2581 Secondary hyperparathyroidism of renal origin: Secondary | ICD-10-CM | POA: Diagnosis not present

## 2022-09-15 DIAGNOSIS — N186 End stage renal disease: Secondary | ICD-10-CM | POA: Diagnosis not present

## 2022-09-15 DIAGNOSIS — Z992 Dependence on renal dialysis: Secondary | ICD-10-CM | POA: Diagnosis not present

## 2022-09-15 DIAGNOSIS — N2581 Secondary hyperparathyroidism of renal origin: Secondary | ICD-10-CM | POA: Diagnosis not present

## 2022-09-17 DIAGNOSIS — N186 End stage renal disease: Secondary | ICD-10-CM | POA: Diagnosis not present

## 2022-09-17 DIAGNOSIS — Z992 Dependence on renal dialysis: Secondary | ICD-10-CM | POA: Diagnosis not present

## 2022-09-17 DIAGNOSIS — N2581 Secondary hyperparathyroidism of renal origin: Secondary | ICD-10-CM | POA: Diagnosis not present

## 2022-09-20 DIAGNOSIS — N186 End stage renal disease: Secondary | ICD-10-CM | POA: Diagnosis not present

## 2022-09-20 DIAGNOSIS — N2581 Secondary hyperparathyroidism of renal origin: Secondary | ICD-10-CM | POA: Diagnosis not present

## 2022-09-20 DIAGNOSIS — Z992 Dependence on renal dialysis: Secondary | ICD-10-CM | POA: Diagnosis not present

## 2022-09-22 DIAGNOSIS — Z992 Dependence on renal dialysis: Secondary | ICD-10-CM | POA: Diagnosis not present

## 2022-09-22 DIAGNOSIS — N186 End stage renal disease: Secondary | ICD-10-CM | POA: Diagnosis not present

## 2022-09-22 DIAGNOSIS — N2581 Secondary hyperparathyroidism of renal origin: Secondary | ICD-10-CM | POA: Diagnosis not present

## 2022-09-22 DIAGNOSIS — I120 Hypertensive chronic kidney disease with stage 5 chronic kidney disease or end stage renal disease: Secondary | ICD-10-CM | POA: Diagnosis not present

## 2022-09-24 DIAGNOSIS — N186 End stage renal disease: Secondary | ICD-10-CM | POA: Diagnosis not present

## 2022-09-24 DIAGNOSIS — Z992 Dependence on renal dialysis: Secondary | ICD-10-CM | POA: Diagnosis not present

## 2022-09-24 DIAGNOSIS — N2581 Secondary hyperparathyroidism of renal origin: Secondary | ICD-10-CM | POA: Diagnosis not present

## 2022-09-27 DIAGNOSIS — Z992 Dependence on renal dialysis: Secondary | ICD-10-CM | POA: Diagnosis not present

## 2022-09-27 DIAGNOSIS — N2581 Secondary hyperparathyroidism of renal origin: Secondary | ICD-10-CM | POA: Diagnosis not present

## 2022-09-27 DIAGNOSIS — N186 End stage renal disease: Secondary | ICD-10-CM | POA: Diagnosis not present

## 2022-09-29 DIAGNOSIS — N186 End stage renal disease: Secondary | ICD-10-CM | POA: Diagnosis not present

## 2022-09-29 DIAGNOSIS — Z992 Dependence on renal dialysis: Secondary | ICD-10-CM | POA: Diagnosis not present

## 2022-09-29 DIAGNOSIS — N2581 Secondary hyperparathyroidism of renal origin: Secondary | ICD-10-CM | POA: Diagnosis not present

## 2022-10-01 DIAGNOSIS — N186 End stage renal disease: Secondary | ICD-10-CM | POA: Diagnosis not present

## 2022-10-01 DIAGNOSIS — Z992 Dependence on renal dialysis: Secondary | ICD-10-CM | POA: Diagnosis not present

## 2022-10-01 DIAGNOSIS — N2581 Secondary hyperparathyroidism of renal origin: Secondary | ICD-10-CM | POA: Diagnosis not present

## 2022-10-04 DIAGNOSIS — N2581 Secondary hyperparathyroidism of renal origin: Secondary | ICD-10-CM | POA: Diagnosis not present

## 2022-10-04 DIAGNOSIS — Z992 Dependence on renal dialysis: Secondary | ICD-10-CM | POA: Diagnosis not present

## 2022-10-04 DIAGNOSIS — N186 End stage renal disease: Secondary | ICD-10-CM | POA: Diagnosis not present

## 2022-10-06 DIAGNOSIS — Z992 Dependence on renal dialysis: Secondary | ICD-10-CM | POA: Diagnosis not present

## 2022-10-06 DIAGNOSIS — N2581 Secondary hyperparathyroidism of renal origin: Secondary | ICD-10-CM | POA: Diagnosis not present

## 2022-10-06 DIAGNOSIS — N186 End stage renal disease: Secondary | ICD-10-CM | POA: Diagnosis not present

## 2022-10-08 DIAGNOSIS — Z992 Dependence on renal dialysis: Secondary | ICD-10-CM | POA: Diagnosis not present

## 2022-10-08 DIAGNOSIS — N2581 Secondary hyperparathyroidism of renal origin: Secondary | ICD-10-CM | POA: Diagnosis not present

## 2022-10-08 DIAGNOSIS — N186 End stage renal disease: Secondary | ICD-10-CM | POA: Diagnosis not present

## 2022-10-11 DIAGNOSIS — N186 End stage renal disease: Secondary | ICD-10-CM | POA: Diagnosis not present

## 2022-10-11 DIAGNOSIS — Z992 Dependence on renal dialysis: Secondary | ICD-10-CM | POA: Diagnosis not present

## 2022-10-11 DIAGNOSIS — N2581 Secondary hyperparathyroidism of renal origin: Secondary | ICD-10-CM | POA: Diagnosis not present

## 2022-10-13 DIAGNOSIS — Z992 Dependence on renal dialysis: Secondary | ICD-10-CM | POA: Diagnosis not present

## 2022-10-13 DIAGNOSIS — N2581 Secondary hyperparathyroidism of renal origin: Secondary | ICD-10-CM | POA: Diagnosis not present

## 2022-10-13 DIAGNOSIS — N186 End stage renal disease: Secondary | ICD-10-CM | POA: Diagnosis not present

## 2022-10-15 DIAGNOSIS — Z992 Dependence on renal dialysis: Secondary | ICD-10-CM | POA: Diagnosis not present

## 2022-10-15 DIAGNOSIS — N186 End stage renal disease: Secondary | ICD-10-CM | POA: Diagnosis not present

## 2022-10-15 DIAGNOSIS — N2581 Secondary hyperparathyroidism of renal origin: Secondary | ICD-10-CM | POA: Diagnosis not present

## 2022-10-18 DIAGNOSIS — N2581 Secondary hyperparathyroidism of renal origin: Secondary | ICD-10-CM | POA: Diagnosis not present

## 2022-10-18 DIAGNOSIS — N186 End stage renal disease: Secondary | ICD-10-CM | POA: Diagnosis not present

## 2022-10-18 DIAGNOSIS — Z992 Dependence on renal dialysis: Secondary | ICD-10-CM | POA: Diagnosis not present

## 2022-10-20 DIAGNOSIS — N2581 Secondary hyperparathyroidism of renal origin: Secondary | ICD-10-CM | POA: Diagnosis not present

## 2022-10-20 DIAGNOSIS — N186 End stage renal disease: Secondary | ICD-10-CM | POA: Diagnosis not present

## 2022-10-20 DIAGNOSIS — Z992 Dependence on renal dialysis: Secondary | ICD-10-CM | POA: Diagnosis not present

## 2022-10-22 DIAGNOSIS — N186 End stage renal disease: Secondary | ICD-10-CM | POA: Diagnosis not present

## 2022-10-22 DIAGNOSIS — N2581 Secondary hyperparathyroidism of renal origin: Secondary | ICD-10-CM | POA: Diagnosis not present

## 2022-10-22 DIAGNOSIS — Z992 Dependence on renal dialysis: Secondary | ICD-10-CM | POA: Diagnosis not present

## 2022-10-23 DIAGNOSIS — I120 Hypertensive chronic kidney disease with stage 5 chronic kidney disease or end stage renal disease: Secondary | ICD-10-CM | POA: Diagnosis not present

## 2022-10-23 DIAGNOSIS — Z992 Dependence on renal dialysis: Secondary | ICD-10-CM | POA: Diagnosis not present

## 2022-10-23 DIAGNOSIS — N186 End stage renal disease: Secondary | ICD-10-CM | POA: Diagnosis not present

## 2022-10-25 DIAGNOSIS — Z992 Dependence on renal dialysis: Secondary | ICD-10-CM | POA: Diagnosis not present

## 2022-10-25 DIAGNOSIS — N2581 Secondary hyperparathyroidism of renal origin: Secondary | ICD-10-CM | POA: Diagnosis not present

## 2022-10-25 DIAGNOSIS — N186 End stage renal disease: Secondary | ICD-10-CM | POA: Diagnosis not present

## 2022-10-27 DIAGNOSIS — Z992 Dependence on renal dialysis: Secondary | ICD-10-CM | POA: Diagnosis not present

## 2022-10-27 DIAGNOSIS — N186 End stage renal disease: Secondary | ICD-10-CM | POA: Diagnosis not present

## 2022-10-27 DIAGNOSIS — N2581 Secondary hyperparathyroidism of renal origin: Secondary | ICD-10-CM | POA: Diagnosis not present

## 2022-10-29 DIAGNOSIS — N186 End stage renal disease: Secondary | ICD-10-CM | POA: Diagnosis not present

## 2022-10-29 DIAGNOSIS — N2581 Secondary hyperparathyroidism of renal origin: Secondary | ICD-10-CM | POA: Diagnosis not present

## 2022-10-29 DIAGNOSIS — Z992 Dependence on renal dialysis: Secondary | ICD-10-CM | POA: Diagnosis not present

## 2022-11-01 DIAGNOSIS — N186 End stage renal disease: Secondary | ICD-10-CM | POA: Diagnosis not present

## 2022-11-01 DIAGNOSIS — Z992 Dependence on renal dialysis: Secondary | ICD-10-CM | POA: Diagnosis not present

## 2022-11-01 DIAGNOSIS — N2581 Secondary hyperparathyroidism of renal origin: Secondary | ICD-10-CM | POA: Diagnosis not present

## 2022-11-03 DIAGNOSIS — N186 End stage renal disease: Secondary | ICD-10-CM | POA: Diagnosis not present

## 2022-11-03 DIAGNOSIS — Z992 Dependence on renal dialysis: Secondary | ICD-10-CM | POA: Diagnosis not present

## 2022-11-03 DIAGNOSIS — N2581 Secondary hyperparathyroidism of renal origin: Secondary | ICD-10-CM | POA: Diagnosis not present

## 2022-11-05 DIAGNOSIS — N186 End stage renal disease: Secondary | ICD-10-CM | POA: Diagnosis not present

## 2022-11-05 DIAGNOSIS — N2581 Secondary hyperparathyroidism of renal origin: Secondary | ICD-10-CM | POA: Diagnosis not present

## 2022-11-05 DIAGNOSIS — Z992 Dependence on renal dialysis: Secondary | ICD-10-CM | POA: Diagnosis not present

## 2022-11-08 DIAGNOSIS — N2581 Secondary hyperparathyroidism of renal origin: Secondary | ICD-10-CM | POA: Diagnosis not present

## 2022-11-08 DIAGNOSIS — Z992 Dependence on renal dialysis: Secondary | ICD-10-CM | POA: Diagnosis not present

## 2022-11-08 DIAGNOSIS — N186 End stage renal disease: Secondary | ICD-10-CM | POA: Diagnosis not present

## 2022-11-10 DIAGNOSIS — N2581 Secondary hyperparathyroidism of renal origin: Secondary | ICD-10-CM | POA: Diagnosis not present

## 2022-11-10 DIAGNOSIS — Z992 Dependence on renal dialysis: Secondary | ICD-10-CM | POA: Diagnosis not present

## 2022-11-10 DIAGNOSIS — N186 End stage renal disease: Secondary | ICD-10-CM | POA: Diagnosis not present

## 2022-11-12 DIAGNOSIS — N2581 Secondary hyperparathyroidism of renal origin: Secondary | ICD-10-CM | POA: Diagnosis not present

## 2022-11-12 DIAGNOSIS — N186 End stage renal disease: Secondary | ICD-10-CM | POA: Diagnosis not present

## 2022-11-12 DIAGNOSIS — Z992 Dependence on renal dialysis: Secondary | ICD-10-CM | POA: Diagnosis not present

## 2022-11-15 DIAGNOSIS — N2581 Secondary hyperparathyroidism of renal origin: Secondary | ICD-10-CM | POA: Diagnosis not present

## 2022-11-15 DIAGNOSIS — Z992 Dependence on renal dialysis: Secondary | ICD-10-CM | POA: Diagnosis not present

## 2022-11-15 DIAGNOSIS — N186 End stage renal disease: Secondary | ICD-10-CM | POA: Diagnosis not present

## 2022-11-17 DIAGNOSIS — Z992 Dependence on renal dialysis: Secondary | ICD-10-CM | POA: Diagnosis not present

## 2022-11-17 DIAGNOSIS — N186 End stage renal disease: Secondary | ICD-10-CM | POA: Diagnosis not present

## 2022-11-17 DIAGNOSIS — N2581 Secondary hyperparathyroidism of renal origin: Secondary | ICD-10-CM | POA: Diagnosis not present

## 2022-11-19 DIAGNOSIS — N2581 Secondary hyperparathyroidism of renal origin: Secondary | ICD-10-CM | POA: Diagnosis not present

## 2022-11-19 DIAGNOSIS — Z992 Dependence on renal dialysis: Secondary | ICD-10-CM | POA: Diagnosis not present

## 2022-11-19 DIAGNOSIS — N186 End stage renal disease: Secondary | ICD-10-CM | POA: Diagnosis not present

## 2022-11-22 DIAGNOSIS — I120 Hypertensive chronic kidney disease with stage 5 chronic kidney disease or end stage renal disease: Secondary | ICD-10-CM | POA: Diagnosis not present

## 2022-11-22 DIAGNOSIS — N186 End stage renal disease: Secondary | ICD-10-CM | POA: Diagnosis not present

## 2022-11-22 DIAGNOSIS — Z992 Dependence on renal dialysis: Secondary | ICD-10-CM | POA: Diagnosis not present

## 2022-11-22 DIAGNOSIS — N2581 Secondary hyperparathyroidism of renal origin: Secondary | ICD-10-CM | POA: Diagnosis not present

## 2022-11-24 DIAGNOSIS — N186 End stage renal disease: Secondary | ICD-10-CM | POA: Diagnosis not present

## 2022-11-24 DIAGNOSIS — N2581 Secondary hyperparathyroidism of renal origin: Secondary | ICD-10-CM | POA: Diagnosis not present

## 2022-11-24 DIAGNOSIS — Z992 Dependence on renal dialysis: Secondary | ICD-10-CM | POA: Diagnosis not present

## 2022-11-26 DIAGNOSIS — Z992 Dependence on renal dialysis: Secondary | ICD-10-CM | POA: Diagnosis not present

## 2022-11-26 DIAGNOSIS — N2581 Secondary hyperparathyroidism of renal origin: Secondary | ICD-10-CM | POA: Diagnosis not present

## 2022-11-26 DIAGNOSIS — N186 End stage renal disease: Secondary | ICD-10-CM | POA: Diagnosis not present

## 2022-11-29 DIAGNOSIS — Z992 Dependence on renal dialysis: Secondary | ICD-10-CM | POA: Diagnosis not present

## 2022-11-29 DIAGNOSIS — N186 End stage renal disease: Secondary | ICD-10-CM | POA: Diagnosis not present

## 2022-11-29 DIAGNOSIS — N2581 Secondary hyperparathyroidism of renal origin: Secondary | ICD-10-CM | POA: Diagnosis not present

## 2022-12-01 DIAGNOSIS — N186 End stage renal disease: Secondary | ICD-10-CM | POA: Diagnosis not present

## 2022-12-01 DIAGNOSIS — Z992 Dependence on renal dialysis: Secondary | ICD-10-CM | POA: Diagnosis not present

## 2022-12-01 DIAGNOSIS — N2581 Secondary hyperparathyroidism of renal origin: Secondary | ICD-10-CM | POA: Diagnosis not present

## 2022-12-03 DIAGNOSIS — N186 End stage renal disease: Secondary | ICD-10-CM | POA: Diagnosis not present

## 2022-12-03 DIAGNOSIS — Z992 Dependence on renal dialysis: Secondary | ICD-10-CM | POA: Diagnosis not present

## 2022-12-03 DIAGNOSIS — N2581 Secondary hyperparathyroidism of renal origin: Secondary | ICD-10-CM | POA: Diagnosis not present

## 2022-12-06 DIAGNOSIS — N2581 Secondary hyperparathyroidism of renal origin: Secondary | ICD-10-CM | POA: Diagnosis not present

## 2022-12-06 DIAGNOSIS — Z992 Dependence on renal dialysis: Secondary | ICD-10-CM | POA: Diagnosis not present

## 2022-12-06 DIAGNOSIS — N186 End stage renal disease: Secondary | ICD-10-CM | POA: Diagnosis not present

## 2022-12-07 ENCOUNTER — Ambulatory Visit: Payer: Medicare HMO | Admitting: *Deleted

## 2022-12-07 DIAGNOSIS — Z0000 Encounter for general adult medical examination without abnormal findings: Secondary | ICD-10-CM

## 2022-12-07 NOTE — Patient Instructions (Signed)
Johnny [name] , Thank you for taking time to come for your Medicare Wellness Visit. I appreciate your ongoing commitment to your health goals. Please review the following plan we discussed and let me know if I can assist you in the future.   Screening recommendations/referrals: Colonoscopy: up to date Recommended yearly ophthalmology/optometry visit for glaucoma screening and checkup Recommended yearly dental visit for hygiene and checkup  Vaccinations: Influenza vaccine: Education provided  Tdap vaccine: up to date     Advanced directives: Education provided   Preventive Care 40-64 Years, Male Preventive care refers to lifestyle choices and visits with your health care provider that can promote health and wellness. What does preventive care include? A yearly physical exam. This is also called an annual well check. Dental exams once or twice a year. Routine eye exams. Ask your health care provider how often you should have your eyes checked. Personal lifestyle choices, including: Daily care of your teeth and gums. Regular physical activity. Eating a healthy diet. Avoiding tobacco and drug use. Limiting alcohol use. Practicing safe sex. Taking low-dose aspirin every day starting at age 73. What happens during an annual well check? The services and screenings done by your health care provider during your annual well check will depend on your age, overall health, lifestyle risk factors, and family history of disease. Counseling  Your health care provider may ask you questions about your: Alcohol use. Tobacco use. Drug use. Emotional well-being. Home and relationship well-being. Sexual activity. Eating habits. Work and work Astronomer. Screening  You may have the following tests or measurements: Height, weight, and BMI. Blood pressure. Lipid and cholesterol levels. These may be checked every 5 years, or more frequently if you are over 36 years old. Skin check. Lung cancer  screening. You may have this screening every year starting at age 25 if you have a 30-pack-year history of smoking and currently smoke or have quit within the past 15 years. Fecal occult blood test (FOBT) of the stool. You may have this test every year starting at age 23. Flexible sigmoidoscopy or colonoscopy. You may have a sigmoidoscopy every 5 years or a colonoscopy every 10 years starting at age 39. Prostate cancer screening. Recommendations will vary depending on your family history and other risks. Hepatitis C blood test. Hepatitis B blood test. Sexually transmitted disease (STD) testing. Diabetes screening. This is done by checking your blood sugar (glucose) after you have not eaten for a while (fasting). You may have this done every 1-3 years. Discuss your test results, treatment options, and if necessary, the need for more tests with your health care provider. Vaccines  Your health care provider may recommend certain vaccines, such as: Influenza vaccine. This is recommended every year. Tetanus, diphtheria, and acellular pertussis (Tdap, Td) vaccine. You may need a Td booster every 10 years. Zoster vaccine. You may need this after age 60. Pneumococcal 13-valent conjugate (PCV13) vaccine. You may need this if you have certain conditions and have not been vaccinated. Pneumococcal polysaccharide (PPSV23) vaccine. You may need one or two doses if you smoke cigarettes or if you have certain conditions. Talk to your health care provider about which screenings and vaccines you need and how often you need them. This information is not intended to replace advice given to you by your health care provider. Make sure you discuss any questions you have with your health care provider. Document Released: 03/07/2015 Document Revised: 10/29/2015 Document Reviewed: 12/10/2014 Elsevier Interactive Patient Education  2017 [employer].  Fall Prevention in the Home Falls can cause injuries. They can happen  to people of all ages. There are many things you can do to make your home safe and to help prevent falls. What can I do on the outside of my home? Regularly fix the edges of walkways and driveways and fix any cracks. Remove anything that might make you trip as you walk through a door, such as a raised step or threshold. Trim any bushes or trees on the path to your home. Use [name] outdoor lighting. Clear any walking paths of anything that might make someone trip, such as rocks or tools. Regularly check to see if handrails are loose or broken. Make sure that both sides of any steps have handrails. Any raised decks and porches should have guardrails on the edges. Have any leaves, snow, or ice cleared regularly. Use sand or salt on walking paths during winter. Clean up any spills in your garage right away. This includes oil or grease spills. What can I do in the bathroom? Use night lights. Install grab bars by the toilet and in the tub and shower. Do not use towel bars as grab bars. Use non-skid mats or decals in the tub or shower. If you need to sit down in the shower, use a plastic, non-slip stool. Keep the floor dry. Clean up any water that spills on the floor as soon as it happens. Remove soap buildup in the tub or shower regularly. Attach bath mats securely with double-sided non-slip rug tape. Do not have throw rugs and other things on the floor that can make you trip. What can I do in the bedroom? Use night lights. Make sure that you have a light by your bed that is easy to reach. Do not use any sheets or blankets that are too big for your bed. They should not hang down onto the floor. Have a firm chair that has side arms. You can use this for support while you get dressed. Do not have throw rugs and other things on the floor that can make you trip. What can I do in the kitchen? Clean up any spills right away. Avoid walking on wet floors. Keep items that you use a lot in  easy-to-reach places. If you need to reach something above you, use a strong step stool that has a grab bar. Keep electrical cords out of the way. Do not use floor polish or wax that makes floors slippery. If you must use wax, use non-skid floor wax. Do not have throw rugs and other things on the floor that can make you trip. What can I do with my stairs? Do not leave any items on the stairs. Make sure that there are handrails on both sides of the stairs and use them. Fix handrails that are broken or loose. Make sure that handrails are as long as the stairways. Check any carpeting to make sure that it is firmly attached to the stairs. Fix any carpet that is loose or worn. Avoid having throw rugs at the top or bottom of the stairs. If you do have throw rugs, attach them to the floor with carpet tape. Make sure that you have a light switch at the top of the stairs and the bottom of the stairs. If you do not have them, ask someone to add them for you. What else can I do to help prevent falls? Wear shoes that: Do not have high heels. Have rubber bottoms. Are comfortable and fit  you well. Are closed at the toe. Do not wear sandals. If you use a stepladder: Make sure that it is fully opened. Do not climb a closed stepladder. Make sure that both sides of the stepladder are locked into place. Ask someone to hold it for you, if possible. Clearly mark and make sure that you can see: Any grab bars or handrails. First and last steps. Where the edge of each step is. Use tools that help you move around (mobility aids) if they are needed. These include: Canes. Walkers. Scooters. Crutches. Turn on the lights when you go into a dark area. Replace any light bulbs as soon as they burn out. Set up your furniture so you have a clear path. Avoid moving your furniture around. If any of your floors are uneven, fix them. If there are any pets around you, be aware of where they are. Review your medicines  with your doctor. Some medicines can make you feel dizzy. This can increase your chance of falling. Ask your doctor what other things that you can do to help prevent falls. This information is not intended to replace advice given to you by your health care provider. Make sure you discuss any questions you have with your health care provider. Document Released: 12/05/2008 Document Revised: 07/17/2015 Document Reviewed: 03/15/2014 Elsevier Interactive Patient Education  2017 [employer].

## 2022-12-07 NOTE — Progress Notes (Signed)
Subjective:   Johnny [name] is a 62 y.o. male who presents for an Initial Medicare Annual Wellness Visit.  Visit Complete: Virtual I connected with  Johnny [name] on 12/07/22 by a audio enabled telemedicine application and verified that I am speaking with the correct person using two identifiers.  Patient Location: Home  Provider Location: Home Office  I discussed the limitations of evaluation and management by telemedicine. The patient expressed understanding and agreed to proceed.  Vital Signs: Because this visit was a virtual/telehealth visit, some criteria may be missing or patient reported. Any vitals not documented were not able to be obtained and vitals that have been documented are patient reported.  Cardiac Risk Factors include: advanced age (>22men, >49 women);male gender;hypertension;obesity (BMI >30kg/m2)     Objective:    There were no vitals filed for this visit. There is no height or weight on file to calculate BMI.     12/07/2022   10:38 AM 04/08/2022    6:21 AM 02/25/2022    6:25 AM 10/12/2021   12:03 PM 09/22/2021    9:48 AM 06/11/2021   10:50 AM 06/01/2021    5:29 AM  Advanced Directives  Does Patient Have a Medical Advance Directive? No No No No No No   Would patient like information on creating a medical advance directive?  No - Patient declined No - Patient declined No - Patient declined No - Patient declined No - Patient declined No - Patient declined    Current Medications (verified) Outpatient Encounter Medications as of 12/07/2022  Medication Sig   amLODipine (NORVASC) 5 MG tablet Take 1 tablet (5 mg total) by mouth daily. (Patient taking differently: Take 5 mg by mouth daily. Takes on non dialysis days.)   ascorbic acid (VITAMIN C) 500 MG tablet Take 1 tablet (500 mg total) by mouth daily.   calcitRIOL (ROCALTROL) 0.5 MCG capsule Take 2 capsules (1 mcg total) by mouth daily. (Patient taking differently: Take 1 mcg by mouth every Monday, Wednesday, and  Friday with hemodialysis.)   cholecalciferol (VITAMIN D3) 25 MCG (1000 UNIT) tablet Take 1,000 Units by mouth daily.   HYDROcodone-acetaminophen (NORCO) 5-325 MG tablet Take 1 tablet by mouth every 6 (six) hours as needed for moderate pain.   RENVELA 800 MG tablet Take 1,600 mg by mouth 3 (three) times daily with meals.   carvedilol (COREG) 12.5 MG tablet Take 1 tablet (12.5 mg total) by mouth 2 (two) times daily with a meal. (Patient not taking: Reported on 05/06/2022)   No facility-administered encounter medications on file as of 12/07/2022.    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   Wears dentures    Past Surgical History:  Procedure Laterality Date   ABDOMINOPLASTY/PANNICULECTOMY WITH LIPOSUCTION Left 10/11/2018   Procedure: Excision of left thigh lipodystrophy;  Surgeon: Peggye Form, DO;  Location: [facility] OR;  Service: Plastics;  Laterality: Left;   AV FISTULA PLACEMENT Left 06/03/2021   Procedure: LEFT RADIOCEPHALIC ARTERIOVENOUS (AV)  FISTULA CREATION;  Surgeon: Cephus Shelling, MD;  Location: [facility] OR;  Service: Vascular;  Laterality: Left;   AV FISTULA PLACEMENT Left 02/25/2022   Procedure: LEFT ARTERIOVENOUS (AV) FISTULA CREATION;  Surgeon: Victorino Sparrow, MD;  Location: [facility] OR;  Service: Vascular;  Laterality: Left;   BASCILIC VEIN TRANSPOSITION Left 04/08/2022   Procedure: LEFT ARM SECOND STAGE BASILIC VEIN TRANSPOSITION;  Surgeon: Victorino Sparrow, MD;  Location: [facility] OR;  Service: Vascular;  Laterality: Left;    COLONOSCOPY  01/31/2012   Procedure: COLONOSCOPY;  Surgeon: Louis Meckel, MD;  Location: [facility] ENDOSCOPY;  Service: Endoscopy;  Laterality: N/A;  bmi is 59   COLONOSCOPY     COLONOSCOPY WITH PROPOFOL N/A 06/13/2019   Procedure: COLONOSCOPY WITH PROPOFOL;  Surgeon: Lynann Bologna, MD;  Location: [facility] ENDOSCOPY;  Service: Endoscopy;  Laterality: N/A;   EYE SURGERY Bilateral    cataracts   IR FLUORO GUIDE CV LINE RIGHT  05/30/2021   IR US GUIDE VASC ACCESS RIGHT  05/30/2021   LIGATION OF COMPETING BRANCHES OF ARTERIOVENOUS FISTULA Left 10/12/2021   Procedure: LIGATION OF SIDE BRANCHES OF LEFT ARM FISTULA;  Surgeon: Victorino Sparrow, MD;  Location: [facility] OR;  Service: Vascular;  Laterality: Left;   MULTIPLE TOOTH EXTRACTIONS     PANNICULECTOMY Bilateral 04/20/2018   Procedure: Excision of right inner thighs;  Surgeon: Peggye Form, DO;  Location: [facility] OR;  Service: Plastics;  Laterality: Bilateral;  Excision of right inner thighs   UPPER GASTROINTESTINAL ENDOSCOPY  06/05/2020   Johnny [name]   Family History  Problem Relation Age of Onset   Breast cancer Mother    Hypertension Mother    Colon cancer Father        died at 21   Hypertension Brother    Esophageal cancer Neg Hx    Stomach cancer Neg Hx    Liver disease Neg Hx    Rectal cancer Neg Hx    Social History   Socioeconomic History   Marital status: Single    Spouse name: Not on file   Number of children: 1   Years of education: Not on file   Highest education level: Not on file  Occupational History   Occupation: disabled    Employer: UNEMPLOYED  Tobacco Use   Smoking status: Never    Passive exposure: Never   Smokeless tobacco: Never  Vaping Use   Vaping status: Never Used  Substance and Sexual Activity   Alcohol use: Not Currently    Comment: social-once a month   Drug use: No    Comment: stopped marijuana about Sep or Oct 2013   Sexual activity: Not Currently  Other Topics Concern   Not on file  Social History  Narrative   Not on file   Social Determinants of Health   Financial Resource Strain: Low Risk  (12/07/2022)   Overall Financial Resource Strain (CARDIA)    Difficulty of Paying Living Expenses: Not hard at all  Food Insecurity: No Food Insecurity (12/07/2022)   Hunger Vital Sign    Worried About Running Out of Food in the Last Year: Never true    Ran Out of Food in the Last Year: Never true  Transportation Needs: No Transportation Needs (12/07/2022)   PRAPARE - Administrator, Civil Service (Medical): No    Lack of Transportation (Non-Medical): No  Physical Activity: Sufficiently Active (12/07/2022)   Exercise Vital Sign    Days of Exercise per Week: 4 days  Minutes of Exercise per Session: 40 min  Stress: No Stress Concern Present (12/07/2022)   [employer] of Occupational Health - Occupational Stress Questionnaire    Feeling of Stress : Not at all  Social Connections: Moderately Isolated (12/07/2022)   Social Connection and Isolation Panel [NHANES]    Frequency of Communication with Friends and Family: More than three times a week    Frequency of Social Gatherings with Friends and Family: More than three times a week    Attends Religious Services: More than 4 times per year    Active Member of [employer] or Organizations: No    Attends Engineer, structural: Never    Marital Status: Never married    Tobacco Counseling Counseling given: Not Answered   Clinical Intake:  Pre-visit preparation completed: Yes  Pain : No/denies pain     Diabetes: No  How often do you need to have someone help you when you read instructions, pamphlets, or other written materials from your doctor or pharmacy?: 1 - Never  Interpreter Needed?: No  Information entered by :: Remi Haggard LPN   Activities of Daily Living    12/07/2022   10:42 AM 04/08/2022    6:26 AM  In your present state of health, do you have any difficulty performing the following activities:   Hearing? 0 0  Vision? 0 0  Difficulty concentrating or making decisions? 0 0  Walking or climbing stairs? 0 0  Dressing or bathing? 0 0  Doing errands, shopping? 0   Preparing Food and eating ? N   Using the Toilet? N   In the past six months, have you accidently leaked urine? N   Do you have problems with loss of bowel control? N   Managing your Medications? N   Managing your Finances? N   Housekeeping or managing your Housekeeping? N     Patient Care Team: Alfredo Martinez, MD as PCP - General (Family Medicine) Linna Darner, RD as Dietitian (Family Medicine) Berenice Primas, [facility]  Indicate any recent Medical Services you may have received from other than [facility] providers in the past year (date may be approximate).     Assessment:   This is a routine wellness examination for [place].  Hearing/Vision screen Hearing Screening - Comments:: No trouble hearing Vision Screening - Comments:: Up to date Cataract surgery  Unsure name   Goals Addressed             This Visit's Progress    Weight (lb) < 200 lb (90.7 kg)       Loose wieght / have kidney transplant       Depression Screen    12/07/2022   10:44 AM 09/22/2021    9:50 AM 06/11/2021   10:50 AM 12/31/2019   10:02 AM 12/28/2017    4:22 PM 08/22/2015    9:11 AM 08/18/2015    8:39 AM  PHQ 2/9 Scores  PHQ - 2 Score 0 0 3 0 0 0 0  PHQ- 9 Score 0 0 3 0       Fall Risk    12/07/2022   10:51 AM 12/07/2022   10:38 AM 09/22/2021    9:50 AM 12/28/2017    4:22 PM 08/18/2015    8:39 AM  Fall Risk   Falls in the past year? 0 0 0 0 No  Number falls in past yr: 0 0 0    Injury with Fall? 0 0 0    Follow up Falls  evaluation completed;Education provided;Falls prevention discussed Falls evaluation completed;Education provided;Falls prevention discussed       MEDICARE RISK AT HOME: Medicare Risk at Home Any stairs in or around the home?: No If so, are there any without handrails?: No Home free of loose throw  rugs in walkways, pet beds, electrical cords, etc?: Yes Adequate lighting in your home to reduce risk of falls?: Yes Life alert?: No Use of a cane, walker or w/c?: No Grab bars in the bathroom?: Yes Shower chair or bench in shower?: No Elevated toilet seat or a handicapped toilet?: Yes  TIMED UP AND GO:  Was the test performed? No    Cognitive Function:        12/07/2022   10:44 AM  6CIT Screen  What time? 0 points  Count back from 20 0 points  Months in reverse 4 points  Repeat phrase 8 points    Immunizations Immunization History  Administered Date(s) Administered   Influenza Split 11/09/2010, 11/29/2011   Influenza,inj,Quad PF,6+ Mos 03/13/2013, 03/13/2014, 12/14/2017, 12/31/2019   Influenza-Unspecified 02/04/2017   PFIZER(Purple Top)SARS-COV-2 Vaccination 05/05/2019, 05/26/2019, 01/29/2020   PNEUMOCOCCAL CONJUGATE-20 06/02/2021, 06/02/2021   Tdap 11/09/2010, 09/11/2021    TDAP status: Up to date  Flu Vaccine status: Due, Education has been provided regarding the importance of this vaccine. Advised may receive this vaccine at local pharmacy or Health Dept. Aware to provide a copy of the vaccination record if obtained from local pharmacy or Health Dept. Verbalized acceptance and understanding.    Covid-19 vaccine status: Information provided on how to obtain vaccines.   Qualifies for Shingles Vaccine? Yes   Zostavax completed No   Shingrix Completed?: No.    Education has been provided regarding the importance of this vaccine. Patient has been advised to call insurance company to determine out of pocket expense if they have not yet received this vaccine. Advised may also receive vaccine at local pharmacy or Health Dept. Verbalized acceptance and understanding.  Screening Tests Health Maintenance  Topic Date Due   Zoster Vaccines- Shingrix (1 of 2) Never done   COVID-19 Vaccine (4 - 2023-24 season) 10/24/2022   INFLUENZA VACCINE  05/23/2023 (Originally 09/23/2022)    Colonoscopy  06/24/2023   Medicare Annual Wellness (AWV)  12/07/2023   DTaP/Tdap/Td (3 - Td or Tdap) 09/12/2031   Hepatitis C Screening  Completed   HIV Screening  Completed   HPV VACCINES  Aged Out    Health Maintenance  Health Maintenance Due  Topic Date Due   Zoster Vaccines- Shingrix (1 of 2) Never done   COVID-19 Vaccine (4 - 2023-24 season) 10/24/2022    Colorectal cancer screening: Type of screening: Colonoscopy. Completed 2022. Repeat every 3 years  Lung Cancer Screening: (Low Dose CT Chest recommended if Age 49-80 years, 20 pack-year currently smoking OR have quit w/in 15years.) does not qualify.   Lung Cancer Screening Referral:   Additional Screening:  Hepatitis C Screening: does not qualify; Completed 2021  Vision Screening: Recommended annual ophthalmology exams for early detection of glaucoma and other disorders of the eye. Is the patient up to date with their annual eye exam?  Yes  Who is the provider or what is the name of the office in which the patient attends annual eye exams? Unsure of name If pt is not established with a provider, would they like to be referred to a provider to establish care? No .   Dental Screening: Recommended annual dental exams for proper oral hygiene    Community  Resource Referral / Chronic Care Management: CRR required this visit?  No   CCM required this visit?  No    Plan:     I have personally reviewed and noted the following in the patient's chart:   Medical and social history Use of alcohol, tobacco or illicit drugs  Current medications and supplements including opioid prescriptions. Patient is currently taking opioid prescriptions. Information provided to patient regarding non-opioid alternatives. Patient advised to discuss non-opioid treatment plan with their provider. Functional ability and status Nutritional status Physical activity Advanced directives List of other physicians Hospitalizations, surgeries,  and ER visits in previous 12 months Vitals Screenings to include cognitive, depression, and falls Referrals and appointments  In addition, I have reviewed and discussed with patient certain preventive protocols, quality metrics, and best practice recommendations. A written personalized care plan for preventive services as well as general preventive health recommendations were provided to patient.     Remi Haggard, LPN   [id number]   After Visit Summary: (MyChart) Due to this being a telephonic visit, the after visit summary with patients personalized plan was offered to patient via MyChart   Nurse Notes:

## 2022-12-08 DIAGNOSIS — N2581 Secondary hyperparathyroidism of renal origin: Secondary | ICD-10-CM | POA: Diagnosis not present

## 2022-12-08 DIAGNOSIS — Z992 Dependence on renal dialysis: Secondary | ICD-10-CM | POA: Diagnosis not present

## 2022-12-08 DIAGNOSIS — N186 End stage renal disease: Secondary | ICD-10-CM | POA: Diagnosis not present

## 2022-12-10 DIAGNOSIS — Z992 Dependence on renal dialysis: Secondary | ICD-10-CM | POA: Diagnosis not present

## 2022-12-10 DIAGNOSIS — N2581 Secondary hyperparathyroidism of renal origin: Secondary | ICD-10-CM | POA: Diagnosis not present

## 2022-12-10 DIAGNOSIS — N186 End stage renal disease: Secondary | ICD-10-CM | POA: Diagnosis not present

## 2022-12-13 DIAGNOSIS — N2581 Secondary hyperparathyroidism of renal origin: Secondary | ICD-10-CM | POA: Diagnosis not present

## 2022-12-13 DIAGNOSIS — Z992 Dependence on renal dialysis: Secondary | ICD-10-CM | POA: Diagnosis not present

## 2022-12-13 DIAGNOSIS — N186 End stage renal disease: Secondary | ICD-10-CM | POA: Diagnosis not present

## 2022-12-15 DIAGNOSIS — N2581 Secondary hyperparathyroidism of renal origin: Secondary | ICD-10-CM | POA: Diagnosis not present

## 2022-12-15 DIAGNOSIS — N186 End stage renal disease: Secondary | ICD-10-CM | POA: Diagnosis not present

## 2022-12-15 DIAGNOSIS — Z992 Dependence on renal dialysis: Secondary | ICD-10-CM | POA: Diagnosis not present

## 2022-12-17 DIAGNOSIS — N186 End stage renal disease: Secondary | ICD-10-CM | POA: Diagnosis not present

## 2022-12-17 DIAGNOSIS — Z992 Dependence on renal dialysis: Secondary | ICD-10-CM | POA: Diagnosis not present

## 2022-12-17 DIAGNOSIS — N2581 Secondary hyperparathyroidism of renal origin: Secondary | ICD-10-CM | POA: Diagnosis not present

## 2022-12-20 DIAGNOSIS — N186 End stage renal disease: Secondary | ICD-10-CM | POA: Diagnosis not present

## 2022-12-20 DIAGNOSIS — N2581 Secondary hyperparathyroidism of renal origin: Secondary | ICD-10-CM | POA: Diagnosis not present

## 2022-12-20 DIAGNOSIS — Z992 Dependence on renal dialysis: Secondary | ICD-10-CM | POA: Diagnosis not present

## 2022-12-22 DIAGNOSIS — N186 End stage renal disease: Secondary | ICD-10-CM | POA: Diagnosis not present

## 2022-12-22 DIAGNOSIS — N2581 Secondary hyperparathyroidism of renal origin: Secondary | ICD-10-CM | POA: Diagnosis not present

## 2022-12-22 DIAGNOSIS — Z992 Dependence on renal dialysis: Secondary | ICD-10-CM | POA: Diagnosis not present

## 2022-12-23 DIAGNOSIS — N186 End stage renal disease: Secondary | ICD-10-CM | POA: Diagnosis not present

## 2022-12-23 DIAGNOSIS — Z992 Dependence on renal dialysis: Secondary | ICD-10-CM | POA: Diagnosis not present

## 2022-12-23 DIAGNOSIS — I120 Hypertensive chronic kidney disease with stage 5 chronic kidney disease or end stage renal disease: Secondary | ICD-10-CM | POA: Diagnosis not present

## 2022-12-24 DIAGNOSIS — N186 End stage renal disease: Secondary | ICD-10-CM | POA: Diagnosis not present

## 2022-12-24 DIAGNOSIS — Z992 Dependence on renal dialysis: Secondary | ICD-10-CM | POA: Diagnosis not present

## 2022-12-24 DIAGNOSIS — N2581 Secondary hyperparathyroidism of renal origin: Secondary | ICD-10-CM | POA: Diagnosis not present

## 2022-12-27 DIAGNOSIS — N2581 Secondary hyperparathyroidism of renal origin: Secondary | ICD-10-CM | POA: Diagnosis not present

## 2022-12-27 DIAGNOSIS — Z992 Dependence on renal dialysis: Secondary | ICD-10-CM | POA: Diagnosis not present

## 2022-12-27 DIAGNOSIS — N186 End stage renal disease: Secondary | ICD-10-CM | POA: Diagnosis not present

## 2022-12-29 DIAGNOSIS — Z992 Dependence on renal dialysis: Secondary | ICD-10-CM | POA: Diagnosis not present

## 2022-12-29 DIAGNOSIS — N2581 Secondary hyperparathyroidism of renal origin: Secondary | ICD-10-CM | POA: Diagnosis not present

## 2022-12-29 DIAGNOSIS — N186 End stage renal disease: Secondary | ICD-10-CM | POA: Diagnosis not present

## 2022-12-31 DIAGNOSIS — N186 End stage renal disease: Secondary | ICD-10-CM | POA: Diagnosis not present

## 2022-12-31 DIAGNOSIS — N2581 Secondary hyperparathyroidism of renal origin: Secondary | ICD-10-CM | POA: Diagnosis not present

## 2022-12-31 DIAGNOSIS — Z992 Dependence on renal dialysis: Secondary | ICD-10-CM | POA: Diagnosis not present

## 2023-01-03 DIAGNOSIS — N2581 Secondary hyperparathyroidism of renal origin: Secondary | ICD-10-CM | POA: Diagnosis not present

## 2023-01-03 DIAGNOSIS — N186 End stage renal disease: Secondary | ICD-10-CM | POA: Diagnosis not present

## 2023-01-03 DIAGNOSIS — Z992 Dependence on renal dialysis: Secondary | ICD-10-CM | POA: Diagnosis not present

## 2023-01-05 DIAGNOSIS — N2581 Secondary hyperparathyroidism of renal origin: Secondary | ICD-10-CM | POA: Diagnosis not present

## 2023-01-05 DIAGNOSIS — Z992 Dependence on renal dialysis: Secondary | ICD-10-CM | POA: Diagnosis not present

## 2023-01-05 DIAGNOSIS — N186 End stage renal disease: Secondary | ICD-10-CM | POA: Diagnosis not present

## 2023-01-07 DIAGNOSIS — Z992 Dependence on renal dialysis: Secondary | ICD-10-CM | POA: Diagnosis not present

## 2023-01-07 DIAGNOSIS — N186 End stage renal disease: Secondary | ICD-10-CM | POA: Diagnosis not present

## 2023-01-07 DIAGNOSIS — N2581 Secondary hyperparathyroidism of renal origin: Secondary | ICD-10-CM | POA: Diagnosis not present

## 2023-01-10 DIAGNOSIS — Z992 Dependence on renal dialysis: Secondary | ICD-10-CM | POA: Diagnosis not present

## 2023-01-10 DIAGNOSIS — N186 End stage renal disease: Secondary | ICD-10-CM | POA: Diagnosis not present

## 2023-01-10 DIAGNOSIS — N2581 Secondary hyperparathyroidism of renal origin: Secondary | ICD-10-CM | POA: Diagnosis not present

## 2023-01-12 DIAGNOSIS — Z992 Dependence on renal dialysis: Secondary | ICD-10-CM | POA: Diagnosis not present

## 2023-01-12 DIAGNOSIS — N2581 Secondary hyperparathyroidism of renal origin: Secondary | ICD-10-CM | POA: Diagnosis not present

## 2023-01-12 DIAGNOSIS — N186 End stage renal disease: Secondary | ICD-10-CM | POA: Diagnosis not present

## 2023-01-14 DIAGNOSIS — N2581 Secondary hyperparathyroidism of renal origin: Secondary | ICD-10-CM | POA: Diagnosis not present

## 2023-01-14 DIAGNOSIS — Z992 Dependence on renal dialysis: Secondary | ICD-10-CM | POA: Diagnosis not present

## 2023-01-14 DIAGNOSIS — N186 End stage renal disease: Secondary | ICD-10-CM | POA: Diagnosis not present

## 2023-01-16 DIAGNOSIS — Z992 Dependence on renal dialysis: Secondary | ICD-10-CM | POA: Diagnosis not present

## 2023-01-16 DIAGNOSIS — N2581 Secondary hyperparathyroidism of renal origin: Secondary | ICD-10-CM | POA: Diagnosis not present

## 2023-01-16 DIAGNOSIS — N186 End stage renal disease: Secondary | ICD-10-CM | POA: Diagnosis not present

## 2023-01-18 DIAGNOSIS — Z992 Dependence on renal dialysis: Secondary | ICD-10-CM | POA: Diagnosis not present

## 2023-01-18 DIAGNOSIS — N186 End stage renal disease: Secondary | ICD-10-CM | POA: Diagnosis not present

## 2023-01-18 DIAGNOSIS — N2581 Secondary hyperparathyroidism of renal origin: Secondary | ICD-10-CM | POA: Diagnosis not present

## 2023-01-21 DIAGNOSIS — Z992 Dependence on renal dialysis: Secondary | ICD-10-CM | POA: Diagnosis not present

## 2023-01-21 DIAGNOSIS — N186 End stage renal disease: Secondary | ICD-10-CM | POA: Diagnosis not present

## 2023-01-21 DIAGNOSIS — N2581 Secondary hyperparathyroidism of renal origin: Secondary | ICD-10-CM | POA: Diagnosis not present

## 2023-01-22 DIAGNOSIS — I120 Hypertensive chronic kidney disease with stage 5 chronic kidney disease or end stage renal disease: Secondary | ICD-10-CM | POA: Diagnosis not present

## 2023-01-22 DIAGNOSIS — N186 End stage renal disease: Secondary | ICD-10-CM | POA: Diagnosis not present

## 2023-01-22 DIAGNOSIS — Z992 Dependence on renal dialysis: Secondary | ICD-10-CM | POA: Diagnosis not present

## 2023-01-24 DIAGNOSIS — N186 End stage renal disease: Secondary | ICD-10-CM | POA: Diagnosis not present

## 2023-01-24 DIAGNOSIS — N2581 Secondary hyperparathyroidism of renal origin: Secondary | ICD-10-CM | POA: Diagnosis not present

## 2023-01-24 DIAGNOSIS — Z992 Dependence on renal dialysis: Secondary | ICD-10-CM | POA: Diagnosis not present

## 2023-01-26 DIAGNOSIS — N186 End stage renal disease: Secondary | ICD-10-CM | POA: Diagnosis not present

## 2023-01-26 DIAGNOSIS — Z992 Dependence on renal dialysis: Secondary | ICD-10-CM | POA: Diagnosis not present

## 2023-01-26 DIAGNOSIS — N2581 Secondary hyperparathyroidism of renal origin: Secondary | ICD-10-CM | POA: Diagnosis not present

## 2023-01-28 DIAGNOSIS — N186 End stage renal disease: Secondary | ICD-10-CM | POA: Diagnosis not present

## 2023-01-28 DIAGNOSIS — Z992 Dependence on renal dialysis: Secondary | ICD-10-CM | POA: Diagnosis not present

## 2023-01-28 DIAGNOSIS — N2581 Secondary hyperparathyroidism of renal origin: Secondary | ICD-10-CM | POA: Diagnosis not present

## 2023-01-31 DIAGNOSIS — Z992 Dependence on renal dialysis: Secondary | ICD-10-CM | POA: Diagnosis not present

## 2023-01-31 DIAGNOSIS — N186 End stage renal disease: Secondary | ICD-10-CM | POA: Diagnosis not present

## 2023-01-31 DIAGNOSIS — N2581 Secondary hyperparathyroidism of renal origin: Secondary | ICD-10-CM | POA: Diagnosis not present

## 2023-02-02 DIAGNOSIS — N2581 Secondary hyperparathyroidism of renal origin: Secondary | ICD-10-CM | POA: Diagnosis not present

## 2023-02-02 DIAGNOSIS — Z992 Dependence on renal dialysis: Secondary | ICD-10-CM | POA: Diagnosis not present

## 2023-02-02 DIAGNOSIS — N186 End stage renal disease: Secondary | ICD-10-CM | POA: Diagnosis not present

## 2023-02-04 DIAGNOSIS — Z992 Dependence on renal dialysis: Secondary | ICD-10-CM | POA: Diagnosis not present

## 2023-02-04 DIAGNOSIS — N2581 Secondary hyperparathyroidism of renal origin: Secondary | ICD-10-CM | POA: Diagnosis not present

## 2023-02-04 DIAGNOSIS — N186 End stage renal disease: Secondary | ICD-10-CM | POA: Diagnosis not present

## 2023-02-07 DIAGNOSIS — N186 End stage renal disease: Secondary | ICD-10-CM | POA: Diagnosis not present

## 2023-02-07 DIAGNOSIS — Z992 Dependence on renal dialysis: Secondary | ICD-10-CM | POA: Diagnosis not present

## 2023-02-07 DIAGNOSIS — N2581 Secondary hyperparathyroidism of renal origin: Secondary | ICD-10-CM | POA: Diagnosis not present

## 2023-02-09 DIAGNOSIS — N186 End stage renal disease: Secondary | ICD-10-CM | POA: Diagnosis not present

## 2023-02-09 DIAGNOSIS — N2581 Secondary hyperparathyroidism of renal origin: Secondary | ICD-10-CM | POA: Diagnosis not present

## 2023-02-09 DIAGNOSIS — Z992 Dependence on renal dialysis: Secondary | ICD-10-CM | POA: Diagnosis not present

## 2023-02-11 DIAGNOSIS — N186 End stage renal disease: Secondary | ICD-10-CM | POA: Diagnosis not present

## 2023-02-11 DIAGNOSIS — N2581 Secondary hyperparathyroidism of renal origin: Secondary | ICD-10-CM | POA: Diagnosis not present

## 2023-02-11 DIAGNOSIS — Z992 Dependence on renal dialysis: Secondary | ICD-10-CM | POA: Diagnosis not present

## 2023-02-13 DIAGNOSIS — N2581 Secondary hyperparathyroidism of renal origin: Secondary | ICD-10-CM | POA: Diagnosis not present

## 2023-02-13 DIAGNOSIS — Z992 Dependence on renal dialysis: Secondary | ICD-10-CM | POA: Diagnosis not present

## 2023-02-13 DIAGNOSIS — N186 End stage renal disease: Secondary | ICD-10-CM | POA: Diagnosis not present

## 2023-02-15 DIAGNOSIS — Z992 Dependence on renal dialysis: Secondary | ICD-10-CM | POA: Diagnosis not present

## 2023-02-15 DIAGNOSIS — N2581 Secondary hyperparathyroidism of renal origin: Secondary | ICD-10-CM | POA: Diagnosis not present

## 2023-02-15 DIAGNOSIS — N186 End stage renal disease: Secondary | ICD-10-CM | POA: Diagnosis not present

## 2023-02-18 DIAGNOSIS — N186 End stage renal disease: Secondary | ICD-10-CM | POA: Diagnosis not present

## 2023-02-18 DIAGNOSIS — N2581 Secondary hyperparathyroidism of renal origin: Secondary | ICD-10-CM | POA: Diagnosis not present

## 2023-02-18 DIAGNOSIS — Z992 Dependence on renal dialysis: Secondary | ICD-10-CM | POA: Diagnosis not present

## 2023-02-20 DIAGNOSIS — Z992 Dependence on renal dialysis: Secondary | ICD-10-CM | POA: Diagnosis not present

## 2023-02-20 DIAGNOSIS — N186 End stage renal disease: Secondary | ICD-10-CM | POA: Diagnosis not present

## 2023-02-20 DIAGNOSIS — N2581 Secondary hyperparathyroidism of renal origin: Secondary | ICD-10-CM | POA: Diagnosis not present

## 2023-02-22 DIAGNOSIS — Z992 Dependence on renal dialysis: Secondary | ICD-10-CM | POA: Diagnosis not present

## 2023-02-22 DIAGNOSIS — I120 Hypertensive chronic kidney disease with stage 5 chronic kidney disease or end stage renal disease: Secondary | ICD-10-CM | POA: Diagnosis not present

## 2023-02-22 DIAGNOSIS — N2581 Secondary hyperparathyroidism of renal origin: Secondary | ICD-10-CM | POA: Diagnosis not present

## 2023-02-22 DIAGNOSIS — N186 End stage renal disease: Secondary | ICD-10-CM | POA: Diagnosis not present

## 2023-02-28 NOTE — Telephone Encounter (Signed)
 Chart reviewed for dialysis update. Msg sent to SW via TREX patient has upcoming appt on 04/12/23 to see social worker, pharm D and have imaging

## 2023-04-28 ENCOUNTER — Encounter (HOSPITAL_COMMUNITY): Payer: Self-pay | Admitting: Nephrology

## 2023-04-28 ENCOUNTER — Ambulatory Visit (HOSPITAL_COMMUNITY)
Admission: RE | Admit: 2023-04-28 | Discharge: 2023-04-28 | Disposition: A | Discharge status: Home / Self Care | Payer: 59 | Source: Ambulatory Visit | Attending: Nephrology | Admitting: Nephrology

## 2023-04-28 ENCOUNTER — Encounter (HOSPITAL_COMMUNITY)
Admission: RE | Disposition: A | Discharge status: Home / Self Care | Payer: Self-pay | Source: Ambulatory Visit | Attending: Nephrology

## 2023-04-28 DIAGNOSIS — D631 Anemia in chronic kidney disease: Secondary | ICD-10-CM | POA: Diagnosis not present

## 2023-04-28 DIAGNOSIS — Y832 Surgical operation with anastomosis, bypass or graft as the cause of abnormal reaction of the patient, or of later complication, without mention of misadventure at the time of the procedure: Secondary | ICD-10-CM | POA: Diagnosis not present

## 2023-04-28 DIAGNOSIS — N186 End stage renal disease: Secondary | ICD-10-CM | POA: Diagnosis not present

## 2023-04-28 DIAGNOSIS — N250 Renal osteodystrophy: Secondary | ICD-10-CM | POA: Insufficient documentation

## 2023-04-28 DIAGNOSIS — Z992 Dependence on renal dialysis: Secondary | ICD-10-CM | POA: Diagnosis not present

## 2023-04-28 DIAGNOSIS — T82858A Stenosis of vascular prosthetic devices, implants and grafts, initial encounter: Secondary | ICD-10-CM | POA: Insufficient documentation

## 2023-04-28 DIAGNOSIS — I509 Heart failure, unspecified: Secondary | ICD-10-CM | POA: Diagnosis not present

## 2023-04-28 DIAGNOSIS — Z86711 Personal history of pulmonary embolism: Secondary | ICD-10-CM | POA: Diagnosis not present

## 2023-04-28 DIAGNOSIS — G4733 Obstructive sleep apnea (adult) (pediatric): Secondary | ICD-10-CM | POA: Diagnosis not present

## 2023-04-28 DIAGNOSIS — I132 Hypertensive heart and chronic kidney disease with heart failure and with stage 5 chronic kidney disease, or end stage renal disease: Secondary | ICD-10-CM | POA: Diagnosis not present

## 2023-04-28 HISTORY — PX: A/V FISTULAGRAM: CATH118298

## 2023-04-28 HISTORY — PX: PERIPHERAL VASCULAR BALLOON ANGIOPLASTY: CATH118281

## 2023-04-28 SURGERY — A/V FISTULAGRAM
Anesthesia: LOCAL

## 2023-04-28 MED ORDER — LIDOCAINE HCL (PF) 1 % IJ SOLN
INTRAMUSCULAR | Status: AC
  Filled 2023-04-28: qty 30

## 2023-04-28 MED ORDER — IODIXANOL 320 MG/ML IV SOLN
INTRAVENOUS | Status: DC | PRN
  Administered 2023-04-28: 10 mL via INTRAVENOUS

## 2023-04-28 MED ORDER — LIDOCAINE HCL (PF) 1 % IJ SOLN
INTRAMUSCULAR | Status: DC | PRN
  Administered 2023-04-28: 2 mL via INTRADERMAL

## 2023-04-28 MED ORDER — FENTANYL CITRATE (PF) 100 MCG/2ML IJ SOLN
INTRAMUSCULAR | Status: AC
  Filled 2023-04-28: qty 2

## 2023-04-28 MED ORDER — HEPARIN (PORCINE) IN NACL 1000-0.9 UT/500ML-% IV SOLN
INTRAVENOUS | Status: DC | PRN
  Administered 2023-04-28: 500 mL

## 2023-04-28 MED ORDER — MIDAZOLAM HCL 2 MG/2ML IJ SOLN
INTRAMUSCULAR | Status: AC
  Filled 2023-04-28: qty 2

## 2023-04-28 SURGICAL SUPPLY — 11 items
BAG SNAP BAND KOVER 36X36 (MISCELLANEOUS) ×3 IMPLANT
BALLN ATHLETIS 6X40X75 (BALLOONS) ×2 IMPLANT
BALLN MUSTANG 7.0X40 75 (BALLOONS) ×2 IMPLANT
BALLOON ATHLETIS 6X40X75 (BALLOONS) IMPLANT
BALLOON MUSTANG 7.0X40 75 (BALLOONS) IMPLANT
CATH ANGIO 5F BER2 65CM (CATHETERS) IMPLANT
COVER DOME SNAP 22 D (MISCELLANEOUS) ×3 IMPLANT
GUIDEWIRE ANGLED .035 180CM (WIRE) IMPLANT
SHEATH PINNACLE R/O II 6F 4CM (SHEATH) IMPLANT
SYR MEDALLION 10ML (SYRINGE) IMPLANT
TRAY PV CATH (CUSTOM PROCEDURE TRAY) ×3 IMPLANT

## 2023-04-28 NOTE — Discharge Instructions (Signed)

## 2023-04-28 NOTE — Op Note (Signed)
 Patient presents for concerns of difficult cannulation and decreased flows in his left BBT which was transposed on April 08, 2022.    On the physical exam, the fistula is hyperpulsatile at the inflow.  Summary:  1)      The patient had successful angioplasty (7 x 4  Mustang to 6 x 4 Athletis  FE ~18 atm) of significant 60-70% stenosis in the inflow basilic vein well above the juxta anastomotic site; the of the fistula,  outflow swing site, slurry vein and centrals were widely patent.  The most is also widely patent.  2)      Flows improved after inflow angioplasty. 3)      This left BBT remains amenable to future percutaneous intervention as long as it remains patent at least 3 months.  Description of procedure: The arm was prepped and draped in the usual sterile fashion. The left upper arm brachial basilic fistula was cannulated (16109) with an 18G Angiocath needle directed in a retrograde direction in venous limb of the fistula. A guidewire was inserted and exchanged for a 6 Fr sheath. Contrast [phone]) injection via the side port of the sheath was performed. The angiogram of the fistula (09811) showed a patent outflow basilic swing site; the axillary vein, centrals, cannulation zone. There was a 60-70% inflow limb basilic vein stenosis well above the juxta anastomosis.  The angled Glidewire was advanced and manipulated until the tip of the wire was in the proximal brachial artery.  Arteriogram revealed a patent brachial artery, arterial anastomosis with a 60-70% juxta anastomotic stenosis. A 7x4 Mustang angioplasty balloon was then inserted over the guidewire and positioned at the basilic vein inflow stenosis.   Venous angioplasty (91478) was carried out to 22 ATM with 90% effacement of the waist on the balloon at the basilic inflow swing site lesion.  We then downsized to a 6 x 4 Athletis balloon effacement was achieved at 18 atm of pressure.  Antegrade arteriogram revealed no evidence of  extravasation or dissection with 10% residual stenosis and more rapid flows.  Hemostasis: A 3-0 ethilon purse string suture was placed at the cannulation site on removal of the sheath.  Sedation: 0 mg Versed, 0 mcg Fentanyl. Sedation time. 0 minutes  Contrast. 10 mL  Monitoring: Because of the patient's comorbid conditions , continuous EKG monitoring and O2 saturation monitoring was performed throughout the procedure by the RN. There were no abnormal arrhythmias encountered.  Complications: None  Diagnoses: I87.1 Stricture of vein  N18.6 ESRD T82.858A Stricture of access  Procedure Coding:  [phone] Cannulation and angiogram of fistula, venous angioplasty (basilic vein outflow swing site)  Z3086 Contrast  Recommendations:  1. Continue to cannulate the fistula with 15G needles.  2. Refer for problems with flows/swelling. 3. Remove the suture next treatment.   Discharge: The patient was discharged home in stable condition. The patient was given education regarding the care of the dialysis access AVF and specific instructions in case of any problems.

## 2023-04-28 NOTE — H&P (Addendum)
 Chief Complaint: Decreased flows  Interval H&P  The patient has presented today for an angiogram/ angioplasty.  Various methods of treatment have been discussed with the patient.  After consideration of risk, benefits and other options for treatment, the patient has consented to a angiogram/ angioplasty with  possible stent placement.   Risks of angiogram with potential angioplasty and stenting if needed.contrast reaction, extravasation/ bleeding, dissection, hypotension and death were explained to the patient.  The patient's history has been reviewed and the patient has been examined, no changes in status.  Stable for angiogram/angioplasty  I have reviewed the patient's chart and labs.  Questions were answered to the patient's satisfaction.  Assessment/Plan: ESRD dialyzing at [facility] MWF regimen with last dialysis Wednesday Decreased access flows and a left upper arm brachiobasilic fistula transposed on April 08, 2022 by Dr. Karin Lieu- planning on angiogram with possibly angioplasty of the inflow.  Sedation as he drove. Renal osteodystrophy - continue binders per home regimen. Anemia - managed with ESA's and IV iron at dialysis center. HTN - resume home regimen.   HPI: Johnny [name] is an 63 y.o. male with a history of congestive heart failure, hypertension, OSA, history of pulmonary embolism, end-stage renal disease being referred for decreased access flows and infiltration.  Patient has a left upper arm brachiobasilic fistula and supposed by Dr. Karin Lieu April 08, 2022.  ROS Per HPI.  Chemistry and CBC: Creat  Date/Time Value Ref Range Status  09/04/2015 10:35 AM 2.94 (H) 0.70 - 1.33 mg/dL Final    Comment:      For patients > or = 62 years of age: The upper reference limit for Creatinine is approximately 13% higher for people identified as African-American.     08/29/2015 11:18 AM 3.50 (H) 0.70 - 1.33 mg/dL Final    Comment:      For patients > or = 62 years of age: The upper  reference limit for Creatinine is approximately 13% higher for people identified as African-American.     08/27/2015 02:26 PM 3.18 (H) 0.70 - 1.33 mg/dL Final    Comment:      For patients > or = 62 years of age: The upper reference limit for Creatinine is approximately 13% higher for people identified as African-American.     08/22/2015 09:27 AM 3.96 (H) 0.70 - 1.33 mg/dL Final    Comment:      For patients > or = 62 years of age: The upper reference limit for Creatinine is approximately 13% higher for people identified as African-American.     03/13/2014 03:06 PM 2.52 (H) 0.50 - 1.35 mg/dL Final  62/95/2841 32:44 AM 2.02 (H) 0.50 - 1.35 mg/dL Final  02/24/7251 66:44 PM 1.73 (H) 0.50 - 1.35 mg/dL Final  03/47/4259 56:38 PM 1.59 (H) 0.50 - 1.35 mg/dL Final  75/64/3329 51:88 AM 1.88 (H) 0.50 - 1.35 mg/dL Final   Creatinine, Ser  Date/Time Value Ref Range Status  04/08/2022 06:18 AM 11.20 (H) 0.61 - 1.24 mg/dL Final  41/66/0630 16:01 AM 11.40 (H) 0.61 - 1.24 mg/dL Final  09/32/3557 32:20 PM 14.30 (H) 0.61 - 1.24 mg/dL Final  25/42/7062 37:62 AM 8.35 (H) 0.61 - 1.24 mg/dL Final  83/15/1761 60:73 AM 5.42 (H) 0.76 - 1.27 mg/dL Final  71/07/2692 85:46 AM 7.54 (H) 0.61 - 1.24 mg/dL Final  27/04/5007 38:18 AM 7.60 (H) 0.61 - 1.24 mg/dL Final  29/93/7169 67:89 AM 7.59 (H) 0.61 - 1.24 mg/dL Final  38/11/1749 02:58 AM 7.59 (H) 0.61 - 1.24 mg/dL  Final  06/01/2021 02:44 AM 10.19 (H) 0.61 - 1.24 mg/dL Final  40/98/1191 47:82 AM 12.21 (H) 0.61 - 1.24 mg/dL Final  95/62/1308 65:78 AM 11.85 (H) 0.61 - 1.24 mg/dL Final  46/96/2952 84:13 PM 13.60 (H) 0.61 - 1.24 mg/dL Final  24/40/1027 25:36 PM 11.88 (H) 0.61 - 1.24 mg/dL Final  64/40/3474 25:95 AM 3.97 (H) 0.76 - 1.27 mg/dL Final  63/87/5643 32:95 AM 3.83 (H) 0.76 - 1.27 mg/dL Final  18/84/1660 63:01 AM 3.56 (H) 0.76 - 1.27 mg/dL Final  60/11/9321 55:73 AM 4.06 (H) 0.61 - 1.24 mg/dL Final  22/03/5425 06:23 PM 4.11 (H) 0.61 - 1.24 mg/dL  Final  76/28/3151 76:16 AM 4.28 (H) 0.61 - 1.24 mg/dL Final  07/37/1062 69:48 PM 3.62 (H) 0.76 - 1.27 mg/dL Final  54/62/7035 00:93 AM 3.64 (H) 0.61 - 1.24 mg/dL Final  81/82/9937 16:96 AM 3.72 (H) 0.61 - 1.24 mg/dL Final  78/93/8101 75:10 AM 3.94 (H) 0.61 - 1.24 mg/dL Final  25/85/2778 24:23 AM 4.37 (H) 0.61 - 1.24 mg/dL Final  53/61/4431 54:00 AM 3.33 (H) 0.61 - 1.24 mg/dL Final  86/76/1950 93:26 AM 3.45 (H) 0.61 - 1.24 mg/dL Final  71/24/5809 98:33 AM 3.94 (H) 0.61 - 1.24 mg/dL Final  82/50/5397 67:34 AM 4.87 (H) 0.61 - 1.24 mg/dL Final  19/37/9024 09:73 AM 4.83 (H) 0.61 - 1.24 mg/dL Final  53/29/9242 68:34 PM 5.42 (H) 0.61 - 1.24 mg/dL Final  19/62/2297 98:92 AM 3.94 (H) 0.61 - 1.24 mg/dL Final  11/94/1740 81:44 PM 4.24 (H) 0.61 - 1.24 mg/dL Final  81/85/6314 97:02 AM 3.91 (H) 0.61 - 1.24 mg/dL Final  63/78/5885 02:77 AM 3.75 (H) 0.61 - 1.24 mg/dL Final  41/28/7867 67:20 PM 4.50 (H) 0.61 - 1.24 mg/dL Final  94/70/9628 36:62 PM 4.50 (H) 0.61 - 1.24 mg/dL Final  94/76/5465 03:54 PM 4.50 (H) 0.61 - 1.24 mg/dL Final  65/68/1275 17:00 AM 4.06 (H) 0.61 - 1.24 mg/dL Final  17/49/4496 75:91 AM 3.55 (H) 0.61 - 1.24 mg/dL Final  63/84/6659 93:57 AM 3.34 (H) 0.61 - 1.24 mg/dL Final  01/77/9390 30:09 AM 3.75 (H) 0.61 - 1.24 mg/dL Final  23/30/0762 26:33 PM 2.56 (H) 0.61 - 1.24 mg/dL Final  35/45/6256 38:93 AM 2.32 (H) 0.4 - 1.5 mg/dL Final  73/42/8768 11:57 PM 2.22 (H) 0.4 - 1.5 mg/dL Final  26/20/3559 74:16 PM 2.4 (H) 0.4 - 1.5 mg/dL Final  38/45/3646 80:32 PM 2.04 (H) 0.4 - 1.5 mg/dL Final  02/14/8249 03:70 PM 2.06 (H) 0.40 - 1.50 mg/dL Final    Comment:    See lab report for associated comment(s)  07/14/2009 08:48 PM 2.37 (H) 0.40 - 1.50 mg/dL Final    Comment:    See lab report for associated comment(s)  07/09/2009 06:45 PM 1.93 (H) 0.40 - 1.50 mg/dL Final  48/88/9169 45:03 PM 1.55 (H) 0.40 - 1.50 mg/dL Final   No results for input(s): "NA", "K", "CL", "CO2", "GLUCOSE", "BUN",  "CREATININE", "CALCIUM", "PHOS" in the last 168 hours.  Invalid input(s): "ALB" No results for input(s): "WBC", "NEUTROABS", "HGB", "HCT", "MCV", "PLT" in the last 168 hours. Liver Function Tests: No results for input(s): "AST", "ALT", "ALKPHOS", "BILITOT", "PROT", "ALBUMIN" in the last 168 hours. No results for input(s): "LIPASE", "AMYLASE" in the last 168 hours. No results for input(s): "AMMONIA" in the last 168 hours. Cardiac Enzymes: No results for input(s): "CKTOTAL", "CKMB", "CKMBINDEX", "TROPONINI" in the last 168 hours. Iron Studies: No results for input(s): "IRON", "TIBC", "TRANSFERRIN", "FERRITIN" in the last 72 hours. PT/INR: @  LABRCNTIP(inr:5)  Xrays/Other Studies: )No results found for this or any previous visit (from the past 48 hours). No results found.  PMH:   Past Medical History:  Diagnosis Date   Acquired lymphedema 12/13/2017   Anemia    CHF (congestive heart failure) (HCC)    Chronic kidney disease    dialysis via right IJ [facility] on  M/W/F at [address] location   Diverticulosis of large intestine without hemorrhage 01/31/2012   Essential HTN 04/21/2006   Excess skin of thigh    left thigh adipose excess   Hypertension    OSA 05/21/2013   does not wear CPAP any longer   PULMONARY EMBOLISM 11/12/2009   Wears dentures     PSH:   Past Surgical History:  Procedure Laterality Date   ABDOMINOPLASTY/PANNICULECTOMY WITH LIPOSUCTION Left 10/11/2018   Procedure: Excision of left thigh lipodystrophy;  Surgeon: Peggye Form, DO;  Location: [facility] OR;  Service: Plastics;  Laterality: Left;   AV FISTULA PLACEMENT Left 06/03/2021   Procedure: LEFT RADIOCEPHALIC ARTERIOVENOUS (AV) FISTULA CREATION;  Surgeon: Cephus Shelling, MD;  Location: [facility] OR;  Service: Vascular;  Laterality: Left;   AV FISTULA PLACEMENT Left 02/25/2022   Procedure: LEFT ARTERIOVENOUS (AV) FISTULA CREATION;  Surgeon: Victorino Sparrow, MD;  Location: [facility] OR;  Service: Vascular;  Laterality: Left;    BASCILIC VEIN TRANSPOSITION Left 04/08/2022   Procedure: LEFT ARM SECOND STAGE BASILIC VEIN TRANSPOSITION;  Surgeon: Victorino Sparrow, MD;  Location: [facility] OR;  Service: Vascular;  Laterality: Left;   COLONOSCOPY  01/31/2012   Procedure: COLONOSCOPY;  Surgeon: Louis Meckel, MD;  Location: [facility] ENDOSCOPY;  Service: Endoscopy;  Laterality: N/A;  bmi is 59   COLONOSCOPY     COLONOSCOPY WITH PROPOFOL N/A 06/13/2019   Procedure: COLONOSCOPY WITH PROPOFOL;  Surgeon: Lynann Bologna, MD;  Location: [facility] ENDOSCOPY;  Service: Endoscopy;  Laterality: N/A;   EYE SURGERY Bilateral    cataracts   IR FLUORO GUIDE CV LINE RIGHT  05/30/2021   IR US GUIDE VASC ACCESS RIGHT  05/30/2021   LIGATION OF COMPETING BRANCHES OF ARTERIOVENOUS FISTULA Left 10/12/2021   Procedure: LIGATION OF SIDE BRANCHES OF LEFT ARM FISTULA;  Surgeon: Victorino Sparrow, MD;  Location: [facility] OR;  Service: Vascular;  Laterality: Left;   MULTIPLE TOOTH EXTRACTIONS     PANNICULECTOMY Bilateral 04/20/2018   Procedure: Excision of right inner thighs;  Surgeon: Peggye Form, DO;  Location: [facility] OR;  Service: Plastics;  Laterality: Bilateral;  Excision of right inner thighs   UPPER GASTROINTESTINAL ENDOSCOPY  06/05/2020   Chales Abrahams    Allergies:  Allergies  Allergen Reactions   Lisinopril Swelling and Other (See Comments)    Angioedema    Medications:   Prior to Admission medications   Medication Sig Start Date End Date Taking? Authorizing Provider  furosemide (LASIX) 80 MG tablet Take 80 mg by mouth daily.   Yes [provider]  RENVELA 800 MG tablet Take 2,400 mg by mouth 3 (three) times daily with meals. 11/03/21  Yes [provider]  carvedilol (COREG) 12.5 MG tablet Take 1 tablet (12.5 mg total) by mouth 2 (two) times daily with a meal. Patient not taking: Reported on 05/06/2022 06/04/21   Osvaldo Shipper, MD  cholecalciferol (VITAMIN D3) 25 MCG (1000 UNIT) tablet Take 1,000 Units by mouth daily.   Yes [provider]  HYDROcodone-acetaminophen (NORCO) 5-325 MG tablet Take 1 tablet by mouth every 6 (six) hours as needed for moderate pain.  Patient not taking: Reported on 04/25/2023 04/08/22   Emilie Rutter, PA-C    Discontinued Meds:   Medications Discontinued During This Encounter  Medication Reason   amLODipine (NORVASC) 5 MG tablet Patient Preference   ascorbic acid (VITAMIN C) 500 MG tablet Patient Preference   calcitRIOL (ROCALTROL) 0.5 MCG capsule Patient Preference    Social History:  reports that he has never smoked. He has never been exposed to tobacco smoke. He has never used smokeless tobacco. He reports that he does not currently use alcohol. He reports that he does not use drugs.  Family History:   Family History  Problem Relation Age of Onset   Breast cancer Mother    Hypertension Mother    Colon cancer Father        died at 79   Hypertension Brother    Esophageal cancer Neg Hx    Stomach cancer Neg Hx    Liver disease Neg Hx    Rectal cancer Neg Hx     Blood pressure 113/71, pulse 81, resp. rate 16, SpO2 97%. GEN: NAD, A&Ox3, NCAT HEENT: No conjunctival pallor, EOMI NECK: Supple, no thyromegaly LUNGS: CTA B/L no rales, rhonchi or wheezing CV: RRR, No M/R/G ABD: SNDNT +BS  EXT: No lower extremity edema ACCESS: Left upper arm brachial basilic transposed fistula with hyper pulsatile inflow       Malie Kashani, Len Blalock, MD 04/28/2023, 7:58 AM

## 2023-08-02 ENCOUNTER — Encounter: Payer: Self-pay | Admitting: *Deleted

## 2023-08-25 ENCOUNTER — Ambulatory Visit (INDEPENDENT_AMBULATORY_CARE_PROVIDER_SITE_OTHER): Admitting: Gastroenterology

## 2023-08-25 ENCOUNTER — Encounter: Payer: Self-pay | Admitting: Gastroenterology

## 2023-08-25 VITALS — BP 122/62 | HR 72 | Ht 69.0 in | Wt 294.1 lb

## 2023-08-25 DIAGNOSIS — Z860101 Personal history of adenomatous and serrated colon polyps: Secondary | ICD-10-CM

## 2023-08-25 DIAGNOSIS — Z860100 Personal history of colon polyps, unspecified: Secondary | ICD-10-CM

## 2023-08-25 DIAGNOSIS — Z09 Encounter for follow-up examination after completed treatment for conditions other than malignant neoplasm: Secondary | ICD-10-CM

## 2023-08-25 MED ORDER — NA SULFATE-K SULFATE-MG SULF 17.5-3.13-1.6 GM/177ML PO SOLN
1.0000 | Freq: Once | ORAL | 0 refills | Status: AC
Start: 2023-08-25 — End: 2023-08-25

## 2023-08-25 NOTE — Patient Instructions (Signed)
 We have sent the following medications to your pharmacy for you to pick up at your convenience: SUPREP  You have been scheduled for a colonoscopy. Please follow written instructions given to you at your visit today.   If you use inhalers (even only as needed), please bring them with you on the day of your procedure.  DO NOT TAKE 7 DAYS PRIOR TO TEST- Trulicity (dulaglutide) Ozempic, Wegovy (semaglutide) Mounjaro (tirzepatide) Bydureon Bcise (exanatide extended release)  DO NOT TAKE 1 DAY PRIOR TO YOUR TEST Rybelsus (semaglutide) Adlyxin (lixisenatide) Victoza (liraglutide) Byetta (exanatide) ___________________________________________________________________________  Due to recent changes in healthcare laws, you may see the results of your imaging and laboratory studies on MyChart before your provider has had a chance to review them.  We understand that in some cases there may be results that are confusing or concerning to you. Not all laboratory results come back in the same time frame and the provider may be waiting for multiple results in order to interpret others.  Please give us  48 hours in order for your provider to thoroughly review all the results before contacting the office for clarification of your results.   _______________________________________________________  If your blood pressure at your visit was 140/90 or greater, please contact your primary care physician to follow up on this.  _______________________________________________________  If you are age 58 or older, your body mass index should be between 23-30. Your Body mass index is 43.43 kg/m. If this is out of the aforementioned range listed, please consider follow up with your Primary Care Provider.  If you are age 62 or younger, your body mass index should be between 19-25. Your Body mass index is 43.43 kg/m. If this is out of the aformentioned range listed, please consider follow up with your Primary Care  Provider.   ________________________________________________________  The [place] [facility] providers would like to encourage you to use MYCHART to communicate with providers for non-urgent requests or questions.  Due to long hold times on the telephone, sending your provider a message by [facility] may be a faster and more efficient way to get a response.  Please allow 48 business hours for a response.  Please remember that this is for non-urgent requests.  _______________________________________________________  Thank you for trusting me with your gastrointestinal care. Deanna May, RNP

## 2023-08-25 NOTE — Progress Notes (Signed)
 Chief Complaint: recall colonoscopy  Primary GI Doctor:Dr. Charlanne  HPI:  Patient is a  63  year old A.A. male patient with past medical history of ESRD on diaylsis, CHF (EF 55%, ),or hypertension, who presents to discuss recall colonoscopy.  Interval History     Patient presents to discuss colon screening colonoscopy for history of colonic polyps. Patient denies altered bowel habits, abdominal pain, and rectal bleeding.   Patient denies GERD and dysphagia. Patient denies nausea, vomiting, or weight loss.  On pre transplant list- needs to lose 17 lbs and get colonoscopy. Dialysis on M/W/F.   No NSAID use.  No blood thinners.   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   Wears dentures     Past Surgical History:  Procedure Laterality Date   A/V FISTULAGRAM N/A 04/28/2023   Procedure: A/V Fistulagram;  Surgeon: Melia Lynwood ORN, MD;  [facility];  Service: Cardiovascular;  Laterality: N/A;   ABDOMINOPLASTY/PANNICULECTOMY WITH LIPOSUCTION Left 10/11/2018   Procedure: Excision of left thigh lipodystrophy;  Surgeon: Lowery Estefana RAMAN, DO;  Location: [facility] OR;  Service: Plastics;  Laterality: Left;   AV FISTULA PLACEMENT Left 06/03/2021   Procedure: LEFT RADIOCEPHALIC ARTERIOVENOUS (AV) FISTULA CREATION;  Surgeon: Gretta Lonni PARAS, MD;  Location: [facility] OR;  Service: Vascular;  Laterality: Left;   AV FISTULA PLACEMENT Left 02/25/2022    Procedure: LEFT ARTERIOVENOUS (AV) FISTULA CREATION;  Surgeon: Lanis Fonda BRAVO, MD;  Location: [facility] OR;  Service: Vascular;  Laterality: Left;   BASCILIC VEIN TRANSPOSITION Left 04/08/2022   Procedure: LEFT ARM SECOND STAGE BASILIC VEIN TRANSPOSITION;  Surgeon: Lanis Fonda BRAVO, MD;  Location: [facility] OR;  Service: Vascular;  Laterality: Left;   COLONOSCOPY  01/31/2012   Procedure: COLONOSCOPY;  Surgeon: Lamar JONETTA Aho, MD;  Location: [facility] ENDOSCOPY;  Service: Endoscopy;  Laterality: N/A;  bmi is 58   COLONOSCOPY     COLONOSCOPY WITH PROPOFOL  N/A 06/13/2019   Procedure: COLONOSCOPY WITH PROPOFOL ;  Surgeon: Charlanne Groom, MD;  Location: [facility] ENDOSCOPY;  Service: Endoscopy;  Laterality: N/A;   EYE SURGERY Bilateral    cataracts   IR FLUORO GUIDE CV LINE RIGHT  05/30/2021   IR US  GUIDE VASC ACCESS RIGHT  05/30/2021   LIGATION OF COMPETING BRANCHES OF ARTERIOVENOUS FISTULA Left 10/12/2021   Procedure: LIGATION OF SIDE BRANCHES OF LEFT ARM FISTULA;  Surgeon: Lanis Fonda BRAVO, MD;  Location: [facility] OR;  Service: Vascular;  Laterality: Left;   MULTIPLE TOOTH EXTRACTIONS     PANNICULECTOMY Bilateral 04/20/2018   Procedure: Excision of right inner thighs;  Surgeon: Lowery Estefana RAMAN, DO;  Location: [facility] OR;  Service: Plastics;  Laterality: Bilateral;  Excision of right inner thighs   PERIPHERAL VASCULAR BALLOON ANGIOPLASTY Left 04/28/2023   Procedure: PERIPHERAL VASCULAR BALLOON ANGIOPLASTY;  Surgeon: Melia Lynwood ORN, MD;  [facility]: [facility];  Service: Cardiovascular;  Laterality: Left;  70% inflow basilic   UPPER GASTROINTESTINAL ENDOSCOPY  06/05/2020   Charlanne    Current Outpatient Medications  Medication Sig Dispense Refill   cholecalciferol  (VITAMIN D3) 25 MCG (1000 UNIT) tablet Take 1,000 Units by mouth daily.     furosemide  (LASIX ) 80 MG tablet Take 80 mg by mouth daily.     midodrine (PROAMATINE) 5 MG tablet Take 5 mg by mouth once a week.     RENVELA 800 MG tablet Take 2,400 mg by mouth 3 (three)  times daily with meals.     carvedilol  (COREG ) 12.5 MG tablet Take 1 tablet (12.5 mg total) by mouth 2 (two) times daily with a meal. (Patient not taking: Reported on 08/25/2023) 60 tablet 0   HYDROcodone -acetaminophen  (NORCO) 5-325 MG tablet Take 1 tablet by mouth every 6 (six) hours as needed for moderate pain. (Patient not taking: Reported on 08/25/2023) 20 tablet 0   No current facility-administered medications for this visit.    Allergies as of 08/25/2023 - Review Complete 08/25/2023  Allergen Reaction Noted   Lisinopril  Swelling and Other (See Comments) 09/05/2014    Family History  Problem Relation Age of Onset   Breast cancer Mother    Hypertension Mother    Colon cancer Father        died at 72   Hypertension Brother    Esophageal cancer Neg Hx    Stomach cancer Neg Hx    Liver disease Neg Hx    Rectal cancer Neg Hx     Review of Systems:    Constitutional: No weight loss, fever, chills, weakness or fatigue HEENT: Eyes: No change in vision               Ears, Nose, Throat:  No change in hearing or congestion Skin: No rash or itching Cardiovascular: No chest pain, chest pressure or palpitations   Respiratory: No SOB or cough Gastrointestinal: See HPI and otherwise negative Genitourinary: No dysuria or change in urinary frequency Neurological: No headache, dizziness or syncope Musculoskeletal: No new muscle or joint pain Hematologic: No bleeding or bruising Psychiatric: No history of depression or anxiety    Physical Exam:  Vital signs: BP 122/62 (BP Location: Right Arm, Patient Position: Sitting, Cuff Size: Large)   Pulse 72   Ht 5' 9 (1.753 m)   Wt 294 lb 2 oz (133.4 kg)   BMI 43.43 kg/m   Constitutional:   Pleasant A.A. male appears to be in NAD, Well developed, Well nourished, alert and cooperative Throat: Oral cavity and pharynx without inflammation, swelling or lesion.  Respiratory: Respirations even and unlabored. Lungs clear to auscultation bilaterally.    No wheezes, crackles, or rhonchi.  Cardiovascular: Normal S1, S2. Regular rate and rhythm. No peripheral edema, cyanosis or pallor.  Gastrointestinal:  Soft, nondistended, nontender. No rebound or guarding. Normal bowel sounds. No appreciable masses or hepatomegaly. Rectal:  Not performed.  Msk:  Symmetrical without gross deformities. Without edema, no deformity or joint abnormality. Fistula LFA Neurologic:  Alert and  oriented x4;  grossly normal neurologically.  Skin:   Dry and intact without significant lesions or rashes. Psychiatric: Oriented to person, place and time. Demonstrates good judgement and reason without abnormal affect or behaviors.  RELEVANT LABS AND IMAGING: CBC    Latest Ref Rng & Units 04/08/2022    6:18 AM 02/25/2022    5:52 AM 10/12/2021   12:09 PM  CBC  Hemoglobin 13.0 -  17.0 g/dL 88.0  87.0  88.7   Hematocrit 39.0 - 52.0 % 35.0  38.0  33.0      CMP     Latest Ref Rng & Units 04/08/2022    6:18 AM 02/25/2022    5:52 AM 10/12/2021   12:09 PM  CMP  Glucose 70 - 99 mg/dL 91  896  89   BUN 6 - 20 mg/dL 43  37  58   Creatinine 0.61 - 1.24 mg/dL 88.79  88.59  85.69   Sodium 135 - 145 mmol/L 136  137  137   Potassium 3.5 - 5.1 mmol/L 3.9  4.7  4.1   Chloride 98 - 111 mmol/L 95  98  102     Lab Results  Component Value Date   TSH 4.020 04/25/2013   05/30/21 echo-Left ventricular ejection fraction, by estimation, is 55%.  06/23/20 colonoscopy with Dr. Charlanne, recall 3 years - Five 4 to 8 mm polyps in the mid descending colon, in the proximal transverse colon and in the ascending colon, removed with a cold snare. Resected and retrieved. - Pancolonic diverticulosis predominantly in the sigmoid colon. - Non- bleeding internal hemorrhoids. - The examined portion of the ileum was normal. - The examination was otherwise normal on direct and retroflexion views. Path:Surgical [P], colon, transverse, ascending and descending, polyp (5) - MULTIPLE FRAGMENTS OF TUBULAR ADENOMA(S) -  NO HIGH-GRADE DYSPLASIA OR MALIGNANCY IDENTIFIED 06/05/20 EGD with Dr Charlanne for IDA- Small transient hiatal hernia - A single duodenal polyp. Biopsied. - No UGI etiology for anemia. 06/12/20 colonoscopy with Dr. Charlanne for rectal bleeding - Patient likely had diverticular bleed from right colonic diverticulosis. Appears to have stopped. - Pancolonic diverticulosis predominantly in the sigmoid colon. - Preparation of the colon was fair d/ t presence of old blood/ clots. - One 6 mm polyp in the proximal transverse colon ( not removed to avoid complicating current clinical picture) - The examined portion of the ileum was normal. - Non- bleeding internal hemorrhoids. - No specimens collected.  Assessment: Encounter Diagnosis  Name Primary?   History of colonic polyps Yes    63 year old A. A male patient who presents to discuss recall colonoscopy for history of TA polyps. Will schedule colonoscopy at [facility] due to ESRD on dialysis with Dr. Charlanne. Currently not having any GI issues.  Plan: -Schedule for a colonoscopy in [facility] hospital (on ESRD) with Dr. Charlanne. The risks and benefits of colonoscopy with possible polypectomy / biopsies were discussed and the patient agrees to proceed.     Thank you for the courtesy of this consult. Please call me with any questions or concerns.   Dewanna Hurston, FNP-C [facility] 08/25/2023, 9:23 AM  Cc: Bryan Bianchi, MD

## 2023-08-25 NOTE — Progress Notes (Signed)
 Agree with assessment/plan.  Edman Circle, MD [facility] [phone]

## 2023-09-29 ENCOUNTER — Telehealth: Payer: Self-pay | Admitting: Gastroenterology

## 2023-09-29 NOTE — Telephone Encounter (Signed)
 Returned patient call & he is requesting an EGD be added on to his hospital colon. Denies any current symptoms, but both an EGD & colon are required to be considered for a kidney transplant. Last seen with Deanna, NP on 08/25/23.

## 2023-09-29 NOTE — Telephone Encounter (Signed)
 Inbound call from patient stating he has an upcoming colonoscopy procedure on 9/11 at [facility] he's now wanting to see if he can be scheduled for an EGD as well. Had an ov on 7/3   Requesting a call back   Please advise  Thank you

## 2023-09-30 NOTE — Telephone Encounter (Signed)
 Left message for patient to call back

## 2023-09-30 NOTE — Telephone Encounter (Signed)
 Reviewed Dr. Ira schedule & also called hospital scheduling to see if there was any room to add on EGD for patient's scheduled colon 9/11, and at this time there is no room. Next available date will be in November when next schedule comes out. Pt has been made aware that I will contact him once that schedule comes out & will add him on. In the meantime, I will let him know if something comes up sooner. Pt verbalized all understanding.

## 2023-10-13 ENCOUNTER — Other Ambulatory Visit: Payer: Self-pay

## 2023-10-13 DIAGNOSIS — Z1211 Encounter for screening for malignant neoplasm of colon: Secondary | ICD-10-CM

## 2023-10-13 DIAGNOSIS — Z9489 Other transplanted organ and tissue status: Secondary | ICD-10-CM

## 2023-10-13 DIAGNOSIS — Z992 Dependence on renal dialysis: Secondary | ICD-10-CM

## 2023-10-13 DIAGNOSIS — Z860100 Personal history of colon polyps, unspecified: Secondary | ICD-10-CM

## 2023-10-13 NOTE — Telephone Encounter (Signed)
 That would be great if OK with Dr. Federico CHRISTIANS

## 2023-10-13 NOTE — Telephone Encounter (Signed)
 Spoke with patient & he is unable to do Dr. Ira next available dates d/t dialysis being MWF. Advised patient I would call him with a rescheduling date.

## 2023-10-14 NOTE — Addendum Note (Signed)
 Addended by: Kendrah Lovern H on: 10/14/2023 03:58 PM   Modules accepted: Orders

## 2023-10-14 NOTE — Telephone Encounter (Signed)
 Patient has been scheduled for 9/2 at 4:00 pm at [facility] with Dr. Federico. Orders placed & updated instructions sent. He is not on any blood thinners/diabetic. He already has prep.

## 2023-10-20 ENCOUNTER — Encounter (HOSPITAL_COMMUNITY): Payer: Self-pay | Admitting: Internal Medicine

## 2023-10-20 NOTE — Telephone Encounter (Addendum)
 Reviewed Dr. Lafonda schedule & it appears patient was not scheduled at 4:00 time. Spoke with Almarie at [facility] endo & we were able to fit patient on same day at 9:30 d/t a cancellation. Instructions updated & patient as been made aware of updated time. Copy of instructions placed at 2nd floor front desk.

## 2023-10-25 ENCOUNTER — Ambulatory Visit (HOSPITAL_COMMUNITY)
Admission: RE | Admit: 2023-10-25 | Discharge: 2023-10-25 | Disposition: A | Discharge status: Home / Self Care | Attending: Internal Medicine | Admitting: Internal Medicine

## 2023-10-25 ENCOUNTER — Encounter (HOSPITAL_COMMUNITY)
Admission: RE | Disposition: A | Discharge status: Home / Self Care | Payer: Self-pay | Source: Home / Self Care | Attending: Internal Medicine

## 2023-10-25 ENCOUNTER — Ambulatory Visit (HOSPITAL_COMMUNITY): Admitting: Anesthesiology

## 2023-10-25 ENCOUNTER — Encounter (HOSPITAL_COMMUNITY): Payer: Self-pay | Admitting: Internal Medicine

## 2023-10-25 ENCOUNTER — Ambulatory Visit (HOSPITAL_BASED_OUTPATIENT_CLINIC_OR_DEPARTMENT_OTHER): Admitting: Anesthesiology

## 2023-10-25 ENCOUNTER — Other Ambulatory Visit: Payer: Self-pay

## 2023-10-25 DIAGNOSIS — I509 Heart failure, unspecified: Secondary | ICD-10-CM | POA: Insufficient documentation

## 2023-10-25 DIAGNOSIS — D123 Benign neoplasm of transverse colon: Secondary | ICD-10-CM | POA: Diagnosis not present

## 2023-10-25 DIAGNOSIS — K635 Polyp of colon: Secondary | ICD-10-CM | POA: Diagnosis not present

## 2023-10-25 DIAGNOSIS — K2990 Gastroduodenitis, unspecified, without bleeding: Secondary | ICD-10-CM | POA: Diagnosis not present

## 2023-10-25 DIAGNOSIS — Z992 Dependence on renal dialysis: Secondary | ICD-10-CM | POA: Diagnosis not present

## 2023-10-25 DIAGNOSIS — D124 Benign neoplasm of descending colon: Secondary | ICD-10-CM | POA: Insufficient documentation

## 2023-10-25 DIAGNOSIS — K449 Diaphragmatic hernia without obstruction or gangrene: Secondary | ICD-10-CM | POA: Diagnosis not present

## 2023-10-25 DIAGNOSIS — Z560 Unemployment, unspecified: Secondary | ICD-10-CM | POA: Insufficient documentation

## 2023-10-25 DIAGNOSIS — K219 Gastro-esophageal reflux disease without esophagitis: Secondary | ICD-10-CM | POA: Diagnosis not present

## 2023-10-25 DIAGNOSIS — N186 End stage renal disease: Secondary | ICD-10-CM | POA: Insufficient documentation

## 2023-10-25 DIAGNOSIS — K2980 Duodenitis without bleeding: Secondary | ICD-10-CM | POA: Insufficient documentation

## 2023-10-25 DIAGNOSIS — K5730 Diverticulosis of large intestine without perforation or abscess without bleeding: Secondary | ICD-10-CM | POA: Diagnosis not present

## 2023-10-25 DIAGNOSIS — K648 Other hemorrhoids: Secondary | ICD-10-CM | POA: Insufficient documentation

## 2023-10-25 DIAGNOSIS — K3189 Other diseases of stomach and duodenum: Secondary | ICD-10-CM | POA: Insufficient documentation

## 2023-10-25 DIAGNOSIS — K2971 Gastritis, unspecified, with bleeding: Secondary | ICD-10-CM | POA: Insufficient documentation

## 2023-10-25 DIAGNOSIS — Z860100 Personal history of colon polyps, unspecified: Secondary | ICD-10-CM

## 2023-10-25 DIAGNOSIS — I132 Hypertensive heart and chronic kidney disease with heart failure and with stage 5 chronic kidney disease, or end stage renal disease: Secondary | ICD-10-CM

## 2023-10-25 DIAGNOSIS — Z1211 Encounter for screening for malignant neoplasm of colon: Secondary | ICD-10-CM | POA: Insufficient documentation

## 2023-10-25 DIAGNOSIS — Z9489 Other transplanted organ and tissue status: Secondary | ICD-10-CM

## 2023-10-25 HISTORY — PX: ESOPHAGOGASTRODUODENOSCOPY: SHX5428

## 2023-10-25 HISTORY — PX: COLONOSCOPY: SHX5424

## 2023-10-25 LAB — POCT I-STAT, CHEM 8
BUN: 49 mg/dL — ABNORMAL HIGH (ref 8–23)
Calcium, Ion: 1.04 mmol/L — ABNORMAL LOW (ref 1.15–1.40)
Chloride: 92 mmol/L — ABNORMAL LOW (ref 98–111)
Creatinine, Ser: 11 mg/dL — ABNORMAL HIGH (ref 0.61–1.24)
Glucose, Bld: 99 mg/dL (ref 70–99)
HCT: 37 % — ABNORMAL LOW (ref 39.0–52.0)
Hemoglobin: 12.6 g/dL — ABNORMAL LOW (ref 13.0–17.0)
Potassium: 4.4 mmol/L (ref 3.5–5.1)
Sodium: 135 mmol/L (ref 135–145)
TCO2: 32 mmol/L (ref 22–32)

## 2023-10-25 SURGERY — EGD (ESOPHAGOGASTRODUODENOSCOPY)
Anesthesia: Monitor Anesthesia Care

## 2023-10-25 MED ORDER — PROPOFOL 1000 MG/100ML IV EMUL
INTRAVENOUS | Status: AC
  Filled 2023-10-25: qty 100

## 2023-10-25 MED ORDER — LIDOCAINE 2% (20 MG/ML) 5 ML SYRINGE
INTRAMUSCULAR | Status: DC | PRN
  Administered 2023-10-25: 100 mg via INTRAVENOUS

## 2023-10-25 MED ORDER — PROPOFOL 500 MG/50ML IV EMUL
INTRAVENOUS | Status: DC | PRN
  Administered 2023-10-25: 150 ug/kg/min via INTRAVENOUS

## 2023-10-25 MED ORDER — SODIUM CHLORIDE 0.9 % IV SOLN: INTRAVENOUS | Status: DC

## 2023-10-25 MED ORDER — SODIUM CHLORIDE 0.9 % IV SOLN
INTRAVENOUS | Status: AC | PRN
  Administered 2023-10-25: 500 mL via INTRAMUSCULAR

## 2023-10-25 MED ORDER — PROPOFOL 500 MG/50ML IV EMUL
INTRAVENOUS | Status: AC
  Filled 2023-10-25: qty 50

## 2023-10-25 MED ORDER — PROPOFOL 10 MG/ML IV BOLUS
INTRAVENOUS | Status: DC | PRN
  Administered 2023-10-25: 30 mg via INTRAVENOUS
  Administered 2023-10-25: 20 mg via INTRAVENOUS

## 2023-10-25 NOTE — Transfer of Care (Signed)
 Immediate Anesthesia Transfer of Care Note  Patient: Johnny [name]  Procedure(s) Performed: EGD (ESOPHAGOGASTRODUODENOSCOPY) COLONOSCOPY  Patient Location: PACU  Anesthesia Type:MAC  Level of Consciousness: sedated  Airway & Oxygen Therapy: Patient Spontanous Breathing and Patient connected to face mask oxygen  Post-op Assessment: Report given to RN and Post -op Vital signs reviewed and stable  Post vital signs: Reviewed and stable  Last Vitals:  Vitals Value Taken Time  BP    Temp    Pulse 78 10/25/23 11:06  Resp 15 10/25/23 11:06  SpO2 96 % 10/25/23 11:06  Vitals shown include unfiled device data.  Last Pain:  Vitals:   10/25/23 0841  TempSrc: Temporal  PainSc: 0-No pain         Complications: No notable events documented.

## 2023-10-25 NOTE — H&P (Signed)
 GASTROENTEROLOGY PROCEDURE H&P NOTE   Primary Care Physician: Lorrane Pac, MD    Reason for Procedure:   GERD, history of colon polyps  Plan:    EGD/colonoscopy  Patient is appropriate for endoscopic procedure(s) in the ambulatory (hospital) setting.  The nature of the procedure, as well as the risks, benefits, and alternatives were carefully and thoroughly reviewed with the patient. Ample time for discussion and questions allowed. The patient understood, was satisfied, and agreed to proceed.     HPI: Johnny [name] is a 63 y.o. male who presents for EGD/colonoscopy for evaluation of GERD, history of colon polyps.  Patient was most recently seen in the Gastroenterology Clinic on 08/25/23.  No interval change in medical history since that appointment. Please refer to that note for full details regarding GI history and clinical presentation.   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   Wears dentures     Past Surgical History:  Procedure Laterality Date   A/V FISTULAGRAM N/A 04/28/2023   Procedure: A/V Fistulagram;  Surgeon: Melia Lynwood ORN, MD;  [facility]: [facility];  Service: Cardiovascular;  Laterality: N/A;   ABDOMINOPLASTY/PANNICULECTOMY WITH LIPOSUCTION Left 10/11/2018   Procedure: Excision of left thigh lipodystrophy;  Surgeon: Lowery Estefana RAMAN, DO;  Location: [facility] OR;  Service: Plastics;  Laterality: Left;   AV FISTULA PLACEMENT Left 06/03/2021   Procedure: LEFT RADIOCEPHALIC ARTERIOVENOUS (AV) FISTULA CREATION;  Surgeon: Gretta Lonni PARAS, MD;  Location:  [facility] OR;  Service: Vascular;  Laterality: Left;   AV FISTULA PLACEMENT Left 02/25/2022   Procedure: LEFT ARTERIOVENOUS (AV) FISTULA CREATION;  Surgeon: Lanis Fonda BRAVO, MD;  Location: [facility] OR;  Service: Vascular;  Laterality: Left;   BASCILIC VEIN TRANSPOSITION Left 04/08/2022   Procedure: LEFT ARM SECOND STAGE BASILIC VEIN TRANSPOSITION;  Surgeon: Lanis Fonda BRAVO, MD;  Location: [facility] OR;  Service: Vascular;  Laterality: Left;   COLONOSCOPY  01/31/2012   Procedure: COLONOSCOPY;  Surgeon: Lamar JONETTA Aho, MD;  Location: [facility] ENDOSCOPY;  Service: Endoscopy;  Laterality: N/A;  bmi is 42   COLONOSCOPY     COLONOSCOPY WITH PROPOFOL  N/A 06/13/2019   Procedure: COLONOSCOPY WITH PROPOFOL ;  Surgeon: Charlanne Groom, MD;  Location: [facility] ENDOSCOPY;  Service: Endoscopy;  Laterality: N/A;   EYE SURGERY Bilateral    cataracts   IR FLUORO GUIDE CV LINE RIGHT  05/30/2021   IR US  GUIDE VASC ACCESS RIGHT  05/30/2021   LIGATION OF COMPETING BRANCHES OF ARTERIOVENOUS FISTULA Left 10/12/2021   Procedure: LIGATION OF SIDE BRANCHES OF LEFT ARM FISTULA;  Surgeon: Lanis Fonda BRAVO, MD;  Location: [facility] OR;  Service: Vascular;  Laterality: Left;   MULTIPLE TOOTH EXTRACTIONS     PANNICULECTOMY Bilateral 04/20/2018   Procedure: Excision of right inner thighs;  Surgeon: Lowery Estefana RAMAN, DO;  Location: [facility] OR;  Service: Plastics;  Laterality: Bilateral;  Excision of right inner thighs   PERIPHERAL VASCULAR BALLOON ANGIOPLASTY Left 04/28/2023   Procedure: PERIPHERAL VASCULAR BALLOON ANGIOPLASTY;  Surgeon: Melia Lynwood ORN, MD;  [facility]: [facility];  Service: Cardiovascular;  Laterality: Left;  70% inflow basilic   UPPER GASTROINTESTINAL ENDOSCOPY  06/05/2020   Charlanne    Prior to Admission medications   Medication Sig Start Date End Date Taking? Authorizing Provider  cholecalciferol  (VITAMIN D3) 25 MCG (1000 UNIT) tablet Take 1,000 Units by mouth daily.   Yes [provider]  midodrine (PROAMATINE) 5 MG tablet Take 5 mg  by mouth once a week. 06/29/23  Yes [provider]  RENVELA 800 MG tablet Take 2,400 mg by mouth 3 (three) times daily with meals. 11/03/21  Yes [provider]  carvedilol  (COREG ) 12.5 MG tablet Take 1 tablet (12.5 mg total) by mouth 2 (two) times daily with a meal. Patient not taking: Reported on 05/06/2022 06/04/21   Krishnan, Gokul, MD  furosemide  (LASIX ) 80 MG tablet Take 80 mg by mouth daily.    [provider]  HYDROcodone -acetaminophen  (NORCO) 5-325 MG tablet Take 1 tablet by mouth every 6 (six) hours as needed for moderate pain. Patient not taking: Reported on 04/25/2023 04/08/22   Bethanie Cough, PA-C    Current Facility-Administered Medications  Medication Dose Route Frequency Provider Last Rate Last Admin   0.9 %  sodium chloride  infusion    Continuous PRN Federico Rosario BROCKS, MD 10 mL/hr at 10/25/23 0853 500 mL at 10/25/23 0853   0.9 %  sodium chloride  infusion   Intravenous Continuous May, Deanna J, NP        Allergies as of 10/14/2023 - Review Complete 08/25/2023  Allergen Reaction Noted   Lisinopril  Swelling and Other (See Comments) 09/05/2014    Family History  Problem Relation Age of Onset   Breast cancer Mother    Hypertension Mother    Colon cancer Father        died at 73   Hypertension Brother    Esophageal cancer Neg Hx    Stomach cancer Neg Hx    Liver disease Neg Hx    Rectal cancer Neg Hx     Social History   Socioeconomic History   Marital status: Single    Spouse name: Not on file   Number of children: 1   Years of education: Not on file   Highest education level: Not on file  Occupational History   Occupation: disabled    Employer: UNEMPLOYED  Tobacco Use   Smoking status: Never    Passive exposure: Never   Smokeless tobacco: Never  Vaping Use   Vaping status: Never Used  Substance and Sexual Activity   Alcohol use: Not Currently    Comment: social-once a month   Drug use: No    Comment: stopped marijuana about Sep or  Oct 2013   Sexual activity: Not Currently  Other Topics Concern   Not on file  Social History Narrative   Not on file   Social Drivers of Health   Financial Resource Strain: Low Risk  (12/07/2022)   Overall Financial Resource Strain (CARDIA)    Difficulty of Paying Living Expenses: Not hard at all  Food Insecurity: No Food Insecurity (12/07/2022)   Hunger Vital Sign    Worried About Running Out of Food in the Last Year: Never true    Ran Out of Food in the Last Year: Never true  Transportation Needs: No Transportation Needs (12/07/2022)   PRAPARE - Administrator, Civil Service (Medical): No    Lack of Transportation (Non-Medical): No  Physical Activity: Sufficiently Active (12/07/2022)  Exercise Vital Sign    Days of Exercise per Week: 4 days    Minutes of Exercise per Session: 40 min  Stress: No Stress Concern Present (12/07/2022)   [employer] of Occupational Health - Occupational Stress Questionnaire    Feeling of Stress : Not at all  Social Connections: Moderately Isolated (12/07/2022)   Social Connection and Isolation Panel    Frequency of Communication with Friends and Family: More than three times a week    Frequency of Social Gatherings with Friends and Family: More than three times a week    Attends Religious Services: More than 4 times per year    Active Member of [employer] or Organizations: No    Attends Banker Meetings: Never    Marital Status: Never married  Intimate Partner Violence: Not At Risk (12/07/2022)   Humiliation, Afraid, Rape, and Kick questionnaire    Fear of Current or Ex-Partner: No    Emotionally Abused: No    Physically Abused: No    Sexually Abused: No    Physical Exam: Vital signs in last 24 hours: BP 112/77   Pulse 82   Temp (!) 97.2 F (36.2 C) (Temporal)   Resp 19   Ht 5' 9 (1.753 m)   Wt 124.7 kg   SpO2 96%   BMI 40.61 kg/m  GEN: NAD EYE: Sclerae anicteric ENT: MMM CV: Non-tachycardic Pulm: No  increased WOB GI: Soft NEURO:  Alert & Oriented   Estefana Kidney, MD [facility]   10/25/2023 9:22 AM

## 2023-10-25 NOTE — Anesthesia Preprocedure Evaluation (Addendum)
 Anesthesia Evaluation  Patient identified by MRN, date of birth, ID band Patient awake    Reviewed: Allergy & Precautions, NPO status , Patient's Chart, lab work & pertinent test results  Airway Mallampati: II  TM Distance: >3 FB Neck ROM: Full    Dental no notable dental hx.    Pulmonary PE   Pulmonary exam normal        Cardiovascular hypertension, Pt. on home beta blockers +CHF  Normal cardiovascular exam     Neuro/Psych    GI/Hepatic Neg liver ROS,,,  Endo/Other  negative endocrine ROS    Renal/GU ESRF and DialysisRenal disease     Musculoskeletal negative musculoskeletal ROS (+)    Abdominal   Peds  Hematology  (+) Blood dyscrasia, anemia   Anesthesia Other Findings Screening for colon cancer History of colon polyps  Reproductive/Obstetrics                              Anesthesia Physical Anesthesia Plan  ASA: 3  Anesthesia Plan: MAC   Post-op Pain Management:    Induction:   PONV Risk Score and Plan: 1 and Propofol  infusion and Treatment may vary due to age or medical condition  Airway Management Planned:   Additional Equipment:   Intra-op Plan:   Post-operative Plan:   Informed Consent: I have reviewed the patients History and Physical, chart, labs and discussed the procedure including the risks, benefits and alternatives for the proposed anesthesia with the patient or authorized representative who has indicated his/her understanding and acceptance.       Plan Discussed with: CRNA  Anesthesia Plan Comments:         Anesthesia Quick Evaluation

## 2023-10-25 NOTE — Op Note (Signed)
 [facility] [name] Procedure Date: 10/25/2023 MRN: [id number] Attending MD: Rosario Estefana Kidney , , [id number] Date of Birth: [birth year] CSN: [id number] Age: 63 Admit Type: Outpatient Procedure:                Colonoscopy Indications:              High risk colon cancer surveillance: Personal                            history of colonic polyps Providers:                Rosario Estefana Kidney, Willy Hummer, RN Referring MD:             Fairy Amy, MD Medicines:                Monitored Anesthesia Care Complications:            No immediate complications. Estimated Blood Loss:     Estimated blood loss was minimal. Procedure:                Pre-Anesthesia Assessment:                           - Prior to the procedure, a History and Physical                            was performed, and patient medications and                            allergies were reviewed. The patient's tolerance of                            previous anesthesia was also reviewed. The risks                            and benefits of the procedure and the sedation                            options and risks were discussed with the patient.                            All questions were answered, and informed consent                            was obtained. Prior Anticoagulants: The patient has                            taken no anticoagulant or antiplatelet agents. ASA                            Grade Assessment: III - A patient with severe                            systemic disease. After reviewing the risks and  benefits, the patient was deemed in satisfactory                            condition to undergo the procedure.                           After obtaining informed consent, the colonoscope                            was passed under direct vision. Throughout the                            procedure, the patient's blood pressure, pulse, and                             oxygen saturations were monitored continuously. The                            CF-HQ190L ([id number]) Olympus colonoscope was                            introduced through the anus and advanced to the the                            terminal ileum. The colonoscopy was performed                            without difficulty. The patient tolerated the                            procedure well. The quality of the bowel                            preparation was good. The terminal ileum, ileocecal                            valve, appendiceal orifice, and rectum were                            photographed. Scope In: 10:41:53 AM Scope Out: 10:58:31 AM Scope Withdrawal Time: 0 hours 12 minutes 36 seconds  Total Procedure Duration: 0 hours 16 minutes 38 seconds  Findings:      The terminal ileum appeared normal.      Four sessile polyps were found in the descending colon and transverse       colon. The polyps were 3 to 8 mm in size. These polyps were removed with       a cold snare. Resection and retrieval were complete.      Multiple diverticula were found in the sigmoid colon and descending       colon.      Non-bleeding internal hemorrhoids were found during retroflexion. Impression:               - The examined portion of the ileum was normal.                           -  Four 3 to 8 mm polyps in the descending colon and                            in the transverse colon, removed with a cold snare.                            Resected and retrieved.                           - Diverticulosis in the sigmoid colon and in the                            descending colon.                           - Non-bleeding internal hemorrhoids. Moderate Sedation:      Not Applicable - Patient had care per Anesthesia. Recommendation:           - Discharge patient to home (with escort).                           - Await pathology results.                           - The findings and  recommendations were discussed                            with the patient. Procedure Code(s):        --- Professional ---                           [phone], Colonoscopy, flexible; with removal of                            tumor(s), polyp(s), or other lesion(s) by snare                            technique Diagnosis Code(s):        --- Professional ---                           Z86.010, Personal history of colonic polyps                           K64.8, Other hemorrhoids                           D12.4, Benign neoplasm of descending colon                           D12.3, Benign neoplasm of transverse colon (hepatic                            flexure or splenic flexure)                           K57.30,  Diverticulosis of large intestine without                            perforation or abscess without bleeding CPT copyright 2022 [employer]. All rights reserved. The codes documented in this report are preliminary and upon coder review may  be revised to meet current compliance requirements. Dr Estefana Federico Rosario Estefana Federico,  10/25/2023 11:05:22 AM Number of Addenda: 0

## 2023-10-25 NOTE — Discharge Instructions (Signed)

## 2023-10-25 NOTE — Op Note (Signed)
 [facility] [name] Procedure Date: 10/25/2023 MRN: [id number] Attending MD: Rosario Estefana Kidney , , [id number] Date of Birth: 1960/12/01 CSN: [id number] Age: 63 Admit Type: Outpatient Procedure:                Upper GI endoscopy Indications:              Heartburn Providers:                Rosario Estefana Kidney, Willy Hummer, RN, Corene Southgate, Technician Referring MD:             Fairy Amy, MD Medicines:                Monitored Anesthesia Care Complications:            No immediate complications. Estimated Blood Loss:     Estimated blood loss was minimal. Procedure:                Pre-Anesthesia Assessment:                           - Prior to the procedure, a History and Physical                            was performed, and patient medications and                            allergies were reviewed. The patient's tolerance of                            previous anesthesia was also reviewed. The risks                            and benefits of the procedure and the sedation                            options and risks were discussed with the patient.                            All questions were answered, and informed consent                            was obtained. Prior Anticoagulants: The patient has                            taken no anticoagulant or antiplatelet agents. ASA                            Grade Assessment: III - A patient with severe                            systemic disease. After reviewing the risks and  benefits, the patient was deemed in satisfactory                            condition to undergo the procedure.                           After obtaining informed consent, the endoscope was                            passed under direct vision. Throughout the                            procedure, the patient's blood pressure, pulse, and                             oxygen saturations were monitored continuously. The                            [id number]-[id number] ([id number]) Olympus endoscope was introduced                            through the mouth, and advanced to the second part                            of duodenum. The upper GI endoscopy was                            accomplished without difficulty. The patient                            tolerated the procedure well. Scope In: Scope Out: Findings:      The examined esophagus was normal.      A small hiatal hernia was present.      Localized inflammation with hemorrhage characterized by congestion       (edema), erosions and erythema was found in the gastric body and in the       gastric antrum. Biopsies were taken with a cold forceps for histology.      Localized inflammation characterized by congestion (edema) and erythema       was found in the duodenal bulb. Impression:               - Normal esophagus.                           - Small hiatal hernia.                           - Gastritis with hemorrhage. Biopsied.                           - Duodenitis. Moderate Sedation:      Not Applicable - Patient had care per Anesthesia. Recommendation:           - Await pathology results.                           -  Perform a colonoscopy today. Procedure Code(s):        --- Professional ---                           [id number], Esophagogastroduodenoscopy, flexible,                            transoral; with biopsy, single or multiple Diagnosis Code(s):        --- Professional ---                           K44.9, Diaphragmatic hernia without obstruction or                            gangrene                           K29.71, Gastritis, unspecified, with bleeding                           K29.80, Duodenitis without bleeding                           R12, Heartburn CPT copyright 2022 [employer]. All rights reserved. The codes documented in this report are preliminary and upon coder review may   be revised to meet current compliance requirements. Dr Estefana Federico Rosario Estefana Federico,  10/25/2023 11:02:30 AM Number of Addenda: 0

## 2023-10-26 ENCOUNTER — Ambulatory Visit: Payer: Self-pay | Admitting: Internal Medicine

## 2023-10-26 LAB — SURGICAL PATHOLOGY

## 2023-10-26 NOTE — Anesthesia Postprocedure Evaluation (Signed)
 Anesthesia Post Note  Patient: Luiz Quale  Procedure(s) Performed: EGD (ESOPHAGOGASTRODUODENOSCOPY) COLONOSCOPY     Patient location during evaluation: Endoscopy Anesthesia Type: MAC Level of consciousness: awake Pain management: pain level controlled Vital Signs Assessment: post-procedure vital signs reviewed and stable Respiratory status: spontaneous breathing, nonlabored ventilation and respiratory function stable Cardiovascular status: blood pressure returned to baseline and stable Postop Assessment: no apparent nausea or vomiting Anesthetic complications: no   No notable events documented.  Last Vitals:  Vitals:   10/25/23 1120 10/25/23 1125  BP: (!) 124/50   Pulse: 80 76  Resp: 20 19  Temp:    SpO2: 93% 93%    Last Pain:  Vitals:   10/25/23 1125  TempSrc:   PainSc: Asleep                 Tarik Teixeira P Bryleigh Ottaway

## 2023-11-03 ENCOUNTER — Encounter (HOSPITAL_COMMUNITY): Payer: Self-pay

## 2023-11-03 ENCOUNTER — Ambulatory Visit (HOSPITAL_COMMUNITY): Admit: 2023-11-03 | Admitting: Gastroenterology

## 2023-11-03 SURGERY — COLONOSCOPY
Anesthesia: Monitor Anesthesia Care

## 2023-12-26 ENCOUNTER — Encounter (HOSPITAL_COMMUNITY): Payer: Self-pay

## 2023-12-26 ENCOUNTER — Ambulatory Visit (HOSPITAL_COMMUNITY): Admit: 2023-12-26 | Admitting: Gastroenterology

## 2023-12-26 SURGERY — EGD (ESOPHAGOGASTRODUODENOSCOPY)
Anesthesia: Monitor Anesthesia Care

## 2024-01-09 ENCOUNTER — Other Ambulatory Visit (HOSPITAL_COMMUNITY): Payer: Self-pay | Admitting: Nephrology

## 2024-01-09 DIAGNOSIS — I739 Peripheral vascular disease, unspecified: Secondary | ICD-10-CM

## 2024-02-09 ENCOUNTER — Encounter: Payer: Self-pay | Admitting: Surgery

## 2024-02-09 ENCOUNTER — Ambulatory Visit (HOSPITAL_COMMUNITY)

## 2024-02-09 ENCOUNTER — Ambulatory Visit (HOSPITAL_COMMUNITY): Admission: RE | Admit: 2024-02-09 | Discharge: 2024-02-09 | Attending: Surgery | Admitting: Surgery

## 2024-02-09 ENCOUNTER — Encounter (HOSPITAL_COMMUNITY): Payer: Self-pay

## 2024-02-09 DIAGNOSIS — I739 Peripheral vascular disease, unspecified: Secondary | ICD-10-CM | POA: Insufficient documentation
